# Patient Record
Sex: Female | Born: 1940
Health system: Southern US, Community
[De-identification: ages and names within clinical notes are randomized; demographics above are authoritative.]

## PROBLEM LIST (undated history)

## (undated) DIAGNOSIS — R112 Nausea with vomiting, unspecified: Secondary | ICD-10-CM

## (undated) DIAGNOSIS — F32A Depression, unspecified: Secondary | ICD-10-CM

## (undated) DIAGNOSIS — C50912 Malignant neoplasm of unspecified site of left female breast: Secondary | ICD-10-CM

## (undated) DIAGNOSIS — M545 Low back pain, unspecified: Secondary | ICD-10-CM

## (undated) DIAGNOSIS — C44329 Squamous cell carcinoma of skin of other parts of face: Secondary | ICD-10-CM

## (undated) DIAGNOSIS — I10 Essential (primary) hypertension: Secondary | ICD-10-CM

## (undated) DIAGNOSIS — E039 Hypothyroidism, unspecified: Secondary | ICD-10-CM

## (undated) DIAGNOSIS — C50911 Malignant neoplasm of unspecified site of right female breast: Secondary | ICD-10-CM

## (undated) DIAGNOSIS — M810 Age-related osteoporosis without current pathological fracture: Secondary | ICD-10-CM

## (undated) DIAGNOSIS — E785 Hyperlipidemia, unspecified: Secondary | ICD-10-CM

## (undated) DIAGNOSIS — Z9889 Other specified postprocedural states: Secondary | ICD-10-CM

## (undated) DIAGNOSIS — D509 Iron deficiency anemia, unspecified: Secondary | ICD-10-CM

## (undated) DIAGNOSIS — K219 Gastro-esophageal reflux disease without esophagitis: Secondary | ICD-10-CM

## (undated) DIAGNOSIS — F419 Anxiety disorder, unspecified: Secondary | ICD-10-CM

## (undated) DIAGNOSIS — F329 Major depressive disorder, single episode, unspecified: Secondary | ICD-10-CM

## (undated) DIAGNOSIS — I4891 Unspecified atrial fibrillation: Secondary | ICD-10-CM

## (undated) DIAGNOSIS — K5792 Diverticulitis of intestine, part unspecified, without perforation or abscess without bleeding: Secondary | ICD-10-CM

## (undated) DIAGNOSIS — R110 Nausea: Secondary | ICD-10-CM

## (undated) DIAGNOSIS — R519 Headache, unspecified: Secondary | ICD-10-CM

## (undated) DIAGNOSIS — R51 Headache: Secondary | ICD-10-CM

## (undated) HISTORY — PX: DILATION AND CURETTAGE OF UTERUS: SHX78

## (undated) HISTORY — PX: INGUINAL HERNIA REPAIR: SUR1180

## (undated) HISTORY — DX: Nausea: R11.0

## (undated) HISTORY — DX: Essential (primary) hypertension: I10

## (undated) HISTORY — PX: WRIST FRACTURE SURGERY: SHX121

## (undated) HISTORY — DX: Low back pain: M54.5

## (undated) HISTORY — DX: Low back pain, unspecified: M54.50

## (undated) HISTORY — DX: Major depressive disorder, single episode, unspecified: F32.9

## (undated) HISTORY — DX: Depression, unspecified: F32.A

## (undated) HISTORY — DX: Age-related osteoporosis without current pathological fracture: M81.0

## (undated) HISTORY — PX: ABDOMINAL HYSTERECTOMY: SHX81

## (undated) HISTORY — DX: Diverticulitis of intestine, part unspecified, without perforation or abscess without bleeding: K57.92

## (undated) HISTORY — PX: EXCISIONAL HEMORRHOIDECTOMY: SHX1541

## (undated) HISTORY — PX: CATARACT EXTRACTION W/ INTRAOCULAR LENS  IMPLANT, BILATERAL: SHX1307

---

## 1970-05-22 HISTORY — PX: TOTAL THYROIDECTOMY: SHX2547

## 2000-01-03 ENCOUNTER — Encounter: Payer: Self-pay | Admitting: Obstetrics and Gynecology

## 2000-01-03 ENCOUNTER — Encounter: Admission: RE | Admit: 2000-01-03 | Discharge: 2000-01-03 | Payer: Self-pay | Admitting: Obstetrics and Gynecology

## 2000-01-09 ENCOUNTER — Encounter: Admission: RE | Admit: 2000-01-09 | Discharge: 2000-01-09 | Payer: Self-pay | Admitting: Internal Medicine

## 2000-01-09 ENCOUNTER — Encounter: Payer: Self-pay | Admitting: Obstetrics and Gynecology

## 2000-02-27 ENCOUNTER — Encounter: Payer: Self-pay | Admitting: Obstetrics and Gynecology

## 2000-02-27 ENCOUNTER — Encounter: Admission: RE | Admit: 2000-02-27 | Discharge: 2000-02-27 | Payer: Self-pay | Admitting: Obstetrics and Gynecology

## 2000-04-08 ENCOUNTER — Encounter: Payer: Self-pay | Admitting: Oral Surgery

## 2000-04-08 ENCOUNTER — Ambulatory Visit (HOSPITAL_COMMUNITY): Admission: RE | Admit: 2000-04-08 | Discharge: 2000-04-08 | Payer: Self-pay | Admitting: Oral Surgery

## 2000-05-22 HISTORY — PX: BREAST LUMPECTOMY: SHX2

## 2000-09-24 ENCOUNTER — Encounter: Payer: Self-pay | Admitting: Obstetrics and Gynecology

## 2000-09-24 ENCOUNTER — Other Ambulatory Visit: Admission: RE | Admit: 2000-09-24 | Discharge: 2000-09-24 | Payer: Self-pay | Admitting: Radiology

## 2000-09-24 ENCOUNTER — Encounter: Admission: RE | Admit: 2000-09-24 | Discharge: 2000-09-24 | Payer: Self-pay | Admitting: Obstetrics and Gynecology

## 2000-09-24 ENCOUNTER — Encounter (INDEPENDENT_AMBULATORY_CARE_PROVIDER_SITE_OTHER): Payer: Self-pay | Admitting: *Deleted

## 2000-10-30 ENCOUNTER — Encounter (INDEPENDENT_AMBULATORY_CARE_PROVIDER_SITE_OTHER): Payer: Self-pay | Admitting: *Deleted

## 2000-10-30 ENCOUNTER — Ambulatory Visit (HOSPITAL_BASED_OUTPATIENT_CLINIC_OR_DEPARTMENT_OTHER): Admission: RE | Admit: 2000-10-30 | Discharge: 2000-10-30 | Payer: Self-pay | Admitting: *Deleted

## 2000-11-13 ENCOUNTER — Encounter: Payer: Self-pay | Admitting: *Deleted

## 2000-11-16 ENCOUNTER — Encounter (INDEPENDENT_AMBULATORY_CARE_PROVIDER_SITE_OTHER): Payer: Self-pay | Admitting: Specialist

## 2000-11-16 ENCOUNTER — Encounter: Payer: Self-pay | Admitting: *Deleted

## 2000-11-16 ENCOUNTER — Ambulatory Visit (HOSPITAL_COMMUNITY): Admission: RE | Admit: 2000-11-16 | Discharge: 2000-11-16 | Payer: Self-pay | Admitting: *Deleted

## 2000-11-26 ENCOUNTER — Ambulatory Visit: Admission: RE | Admit: 2000-11-26 | Discharge: 2001-02-24 | Payer: Self-pay | Admitting: Radiation Oncology

## 2001-10-24 ENCOUNTER — Encounter: Admission: RE | Admit: 2001-10-24 | Discharge: 2001-10-24 | Payer: Self-pay | Admitting: Oncology

## 2001-10-24 ENCOUNTER — Encounter (HOSPITAL_COMMUNITY): Payer: Self-pay | Admitting: Oncology

## 2001-12-03 ENCOUNTER — Encounter (HOSPITAL_COMMUNITY): Payer: Self-pay | Admitting: Oncology

## 2001-12-03 ENCOUNTER — Ambulatory Visit (HOSPITAL_COMMUNITY): Admission: RE | Admit: 2001-12-03 | Discharge: 2001-12-03 | Payer: Self-pay | Admitting: Oncology

## 2002-11-14 ENCOUNTER — Encounter: Admission: RE | Admit: 2002-11-14 | Discharge: 2002-11-14 | Payer: Self-pay | Admitting: Oncology

## 2002-11-14 ENCOUNTER — Encounter (HOSPITAL_COMMUNITY): Payer: Self-pay | Admitting: Oncology

## 2003-01-15 ENCOUNTER — Encounter (HOSPITAL_COMMUNITY): Payer: Self-pay | Admitting: Oncology

## 2003-01-15 ENCOUNTER — Ambulatory Visit (HOSPITAL_COMMUNITY): Admission: RE | Admit: 2003-01-15 | Discharge: 2003-01-15 | Payer: Self-pay | Admitting: Oncology

## 2003-05-23 HISTORY — PX: MASTECTOMY: SHX3

## 2004-01-08 ENCOUNTER — Encounter (INDEPENDENT_AMBULATORY_CARE_PROVIDER_SITE_OTHER): Payer: Self-pay | Admitting: Specialist

## 2004-01-08 ENCOUNTER — Encounter: Admission: RE | Admit: 2004-01-08 | Discharge: 2004-01-08 | Payer: Self-pay | Admitting: Oncology

## 2004-01-12 ENCOUNTER — Ambulatory Visit (HOSPITAL_COMMUNITY): Admission: RE | Admit: 2004-01-12 | Discharge: 2004-01-12 | Payer: Self-pay | Admitting: Oncology

## 2004-01-18 ENCOUNTER — Encounter (HOSPITAL_COMMUNITY): Admission: RE | Admit: 2004-01-18 | Discharge: 2004-01-26 | Payer: Self-pay | Admitting: Oncology

## 2004-02-11 ENCOUNTER — Ambulatory Visit (HOSPITAL_COMMUNITY): Admission: RE | Admit: 2004-02-11 | Discharge: 2004-02-13 | Payer: Self-pay | Admitting: General Surgery

## 2004-02-11 ENCOUNTER — Encounter (INDEPENDENT_AMBULATORY_CARE_PROVIDER_SITE_OTHER): Payer: Self-pay | Admitting: *Deleted

## 2004-07-04 ENCOUNTER — Ambulatory Visit: Payer: Self-pay | Admitting: Oncology

## 2004-11-04 ENCOUNTER — Ambulatory Visit: Payer: Self-pay | Admitting: Oncology

## 2005-02-22 ENCOUNTER — Ambulatory Visit: Payer: Self-pay | Admitting: Oncology

## 2005-02-23 ENCOUNTER — Ambulatory Visit (HOSPITAL_COMMUNITY): Admission: RE | Admit: 2005-02-23 | Discharge: 2005-02-23 | Payer: Self-pay | Admitting: Oncology

## 2005-03-06 ENCOUNTER — Encounter: Admission: RE | Admit: 2005-03-06 | Discharge: 2005-03-06 | Payer: Self-pay | Admitting: Oncology

## 2005-08-23 ENCOUNTER — Ambulatory Visit: Payer: Self-pay | Admitting: Oncology

## 2005-08-24 LAB — COMPREHENSIVE METABOLIC PANEL
ALT: 13 U/L (ref 0–40)
AST: 19 U/L (ref 0–37)
Albumin: 4.3 g/dL (ref 3.5–5.2)
Alkaline Phosphatase: 84 U/L (ref 39–117)
BUN: 15 mg/dL (ref 6–23)
CO2: 27 mEq/L (ref 19–32)
Calcium: 9.2 mg/dL (ref 8.4–10.5)
Chloride: 103 mEq/L (ref 96–112)
Creatinine, Ser: 0.9 mg/dL (ref 0.4–1.2)
Glucose, Bld: 113 mg/dL — ABNORMAL HIGH (ref 70–99)
Potassium: 4.1 mEq/L (ref 3.5–5.3)
Sodium: 140 mEq/L (ref 135–145)
Total Bilirubin: 0.6 mg/dL (ref 0.3–1.2)
Total Protein: 6.9 g/dL (ref 6.0–8.3)

## 2005-08-24 LAB — CBC WITH DIFFERENTIAL/PLATELET
BASO%: 0.7 % (ref 0.0–2.0)
Basophils Absolute: 0 10*3/uL (ref 0.0–0.1)
EOS%: 2.8 % (ref 0.0–7.0)
Eosinophils Absolute: 0.2 10*3/uL (ref 0.0–0.5)
HCT: 42.6 % (ref 34.8–46.6)
HGB: 14.4 g/dL (ref 11.6–15.9)
LYMPH%: 35.2 % (ref 14.0–48.0)
MCH: 29.9 pg (ref 26.0–34.0)
MCHC: 33.8 g/dL (ref 32.0–36.0)
MCV: 88.5 fL (ref 81.0–101.0)
MONO#: 0.4 10*3/uL (ref 0.1–0.9)
MONO%: 7.2 % (ref 0.0–13.0)
NEUT#: 3.2 10*3/uL (ref 1.5–6.5)
NEUT%: 54.1 % (ref 39.6–76.8)
Platelets: 192 10*3/uL (ref 145–400)
RBC: 4.81 10*6/uL (ref 3.70–5.32)
RDW: 13.1 % (ref 11.3–14.5)
WBC: 6 10*3/uL (ref 3.9–10.0)
lymph#: 2.1 10*3/uL (ref 0.9–3.3)

## 2005-08-24 LAB — LACTATE DEHYDROGENASE: LDH: 140 U/L (ref 94–250)

## 2006-03-01 ENCOUNTER — Ambulatory Visit: Payer: Self-pay | Admitting: Oncology

## 2006-03-05 ENCOUNTER — Ambulatory Visit (HOSPITAL_COMMUNITY): Admission: RE | Admit: 2006-03-05 | Discharge: 2006-03-05 | Payer: Self-pay | Admitting: Oncology

## 2006-03-12 ENCOUNTER — Encounter: Admission: RE | Admit: 2006-03-12 | Discharge: 2006-03-12 | Payer: Self-pay | Admitting: Oncology

## 2006-08-12 ENCOUNTER — Emergency Department (HOSPITAL_COMMUNITY): Admission: EM | Admit: 2006-08-12 | Discharge: 2006-08-13 | Payer: Self-pay | Admitting: Emergency Medicine

## 2006-08-29 ENCOUNTER — Ambulatory Visit: Payer: Self-pay | Admitting: Oncology

## 2006-09-03 LAB — CBC WITH DIFFERENTIAL/PLATELET
Eosinophils Absolute: 0.2 10*3/uL (ref 0.0–0.5)
MCV: 87.7 fL (ref 81.0–101.0)
MONO#: 0.6 10*3/uL (ref 0.1–0.9)
MONO%: 9.4 % (ref 0.0–13.0)
NEUT#: 3.7 10*3/uL (ref 1.5–6.5)
RBC: 4.5 10*6/uL (ref 3.70–5.32)
RDW: 13 % (ref 11.3–14.5)
WBC: 6.4 10*3/uL (ref 3.9–10.0)
lymph#: 1.9 10*3/uL (ref 0.9–3.3)

## 2006-09-03 LAB — COMPREHENSIVE METABOLIC PANEL
AST: 17 U/L (ref 0–37)
Albumin: 4.1 g/dL (ref 3.5–5.2)
Alkaline Phosphatase: 84 U/L (ref 39–117)
BUN: 16 mg/dL (ref 6–23)
Calcium: 9.2 mg/dL (ref 8.4–10.5)
Creatinine, Ser: 1.05 mg/dL (ref 0.40–1.20)
Glucose, Bld: 85 mg/dL (ref 70–99)
Potassium: 4.3 mEq/L (ref 3.5–5.3)

## 2007-02-28 ENCOUNTER — Ambulatory Visit: Payer: Self-pay | Admitting: Oncology

## 2007-03-04 ENCOUNTER — Ambulatory Visit (HOSPITAL_COMMUNITY): Admission: RE | Admit: 2007-03-04 | Discharge: 2007-03-04 | Payer: Self-pay | Admitting: Oncology

## 2007-03-04 LAB — CBC WITH DIFFERENTIAL/PLATELET
Basophils Absolute: 0.1 10*3/uL (ref 0.0–0.1)
Eosinophils Absolute: 0.2 10*3/uL (ref 0.0–0.5)
HGB: 13.5 g/dL (ref 11.6–15.9)
MONO#: 0.5 10*3/uL (ref 0.1–0.9)
NEUT#: 3.1 10*3/uL (ref 1.5–6.5)
Platelets: 188 10*3/uL (ref 145–400)
RBC: 4.47 10*6/uL (ref 3.70–5.32)
RDW: 12.9 % (ref 11.3–14.5)
WBC: 5.6 10*3/uL (ref 3.9–10.0)

## 2007-03-04 LAB — COMPREHENSIVE METABOLIC PANEL
Albumin: 4.3 g/dL (ref 3.5–5.2)
BUN: 16 mg/dL (ref 6–23)
CO2: 26 mEq/L (ref 19–32)
Calcium: 9.1 mg/dL (ref 8.4–10.5)
Chloride: 103 mEq/L (ref 96–112)
Glucose, Bld: 90 mg/dL (ref 70–99)
Potassium: 4.2 mEq/L (ref 3.5–5.3)
Sodium: 138 mEq/L (ref 135–145)
Total Protein: 6.9 g/dL (ref 6.0–8.3)

## 2007-03-04 LAB — LACTATE DEHYDROGENASE: LDH: 142 U/L (ref 94–250)

## 2007-03-18 ENCOUNTER — Encounter: Admission: RE | Admit: 2007-03-18 | Discharge: 2007-03-18 | Payer: Self-pay | Admitting: Oncology

## 2007-08-28 ENCOUNTER — Ambulatory Visit: Payer: Self-pay | Admitting: Oncology

## 2007-09-02 LAB — CBC WITH DIFFERENTIAL/PLATELET
BASO%: 0.1 % (ref 0.0–2.0)
HCT: 40.3 % (ref 34.8–46.6)
MCHC: 34.3 g/dL (ref 32.0–36.0)
MONO#: 0.4 10*3/uL (ref 0.1–0.9)
NEUT%: 48.7 % (ref 39.6–76.8)
WBC: 6 10*3/uL (ref 3.9–10.0)
lymph#: 2.4 10*3/uL (ref 0.9–3.3)

## 2007-09-02 LAB — LACTATE DEHYDROGENASE: LDH: 129 U/L (ref 94–250)

## 2007-09-02 LAB — COMPREHENSIVE METABOLIC PANEL
ALT: 11 U/L (ref 0–35)
Albumin: 4.2 g/dL (ref 3.5–5.2)
CO2: 25 mEq/L (ref 19–32)
Calcium: 9.2 mg/dL (ref 8.4–10.5)
Chloride: 104 mEq/L (ref 96–112)
Creatinine, Ser: 0.92 mg/dL (ref 0.40–1.20)
Potassium: 3.7 mEq/L (ref 3.5–5.3)
Sodium: 141 mEq/L (ref 135–145)
Total Protein: 6.9 g/dL (ref 6.0–8.3)

## 2008-02-27 ENCOUNTER — Ambulatory Visit: Payer: Self-pay | Admitting: Oncology

## 2008-03-02 ENCOUNTER — Ambulatory Visit (HOSPITAL_COMMUNITY): Admission: RE | Admit: 2008-03-02 | Discharge: 2008-03-02 | Payer: Self-pay | Admitting: Oncology

## 2008-03-02 LAB — CBC WITH DIFFERENTIAL/PLATELET
Eosinophils Absolute: 0.2 10*3/uL (ref 0.0–0.5)
HCT: 40.4 % (ref 34.8–46.6)
LYMPH%: 29.5 % (ref 14.0–48.0)
MONO#: 0.6 10*3/uL (ref 0.1–0.9)
NEUT#: 3.4 10*3/uL (ref 1.5–6.5)
NEUT%: 56.7 % (ref 39.6–76.8)
Platelets: 169 10*3/uL (ref 145–400)
WBC: 6.1 10*3/uL (ref 3.9–10.0)

## 2008-03-02 LAB — LACTATE DEHYDROGENASE: LDH: 137 U/L (ref 94–250)

## 2008-03-02 LAB — COMPREHENSIVE METABOLIC PANEL
CO2: 25 mEq/L (ref 19–32)
Creatinine, Ser: 0.88 mg/dL (ref 0.40–1.20)
Glucose, Bld: 92 mg/dL (ref 70–99)
Sodium: 140 mEq/L (ref 135–145)
Total Bilirubin: 0.8 mg/dL (ref 0.3–1.2)
Total Protein: 6.7 g/dL (ref 6.0–8.3)

## 2008-03-19 ENCOUNTER — Encounter: Admission: RE | Admit: 2008-03-19 | Discharge: 2008-03-19 | Payer: Self-pay | Admitting: Oncology

## 2008-08-27 ENCOUNTER — Ambulatory Visit: Payer: Self-pay | Admitting: Oncology

## 2008-08-31 LAB — COMPREHENSIVE METABOLIC PANEL
AST: 14 U/L (ref 0–37)
BUN: 13 mg/dL (ref 6–23)
CO2: 25 mEq/L (ref 19–32)
Calcium: 9.1 mg/dL (ref 8.4–10.5)
Chloride: 103 mEq/L (ref 96–112)
Creatinine, Ser: 0.96 mg/dL (ref 0.40–1.20)

## 2008-08-31 LAB — CBC WITH DIFFERENTIAL/PLATELET
Basophils Absolute: 0.1 10*3/uL (ref 0.0–0.1)
EOS%: 3.4 % (ref 0.0–7.0)
HCT: 40.3 % (ref 34.8–46.6)
HGB: 13.7 g/dL (ref 11.6–15.9)
MCH: 30.2 pg (ref 25.1–34.0)
NEUT%: 50.5 % (ref 38.4–76.8)
Platelets: 199 10*3/uL (ref 145–400)
lymph#: 1.9 10*3/uL (ref 0.9–3.3)

## 2009-03-01 ENCOUNTER — Ambulatory Visit: Payer: Self-pay | Admitting: Oncology

## 2009-03-01 LAB — CBC WITH DIFFERENTIAL/PLATELET
BASO%: 1.1 % (ref 0.0–2.0)
EOS%: 3.2 % (ref 0.0–7.0)
HCT: 38.2 % (ref 34.8–46.6)
LYMPH%: 33.4 % (ref 14.0–49.7)
MCH: 29.3 pg (ref 25.1–34.0)
MCHC: 33.2 g/dL (ref 31.5–36.0)
MONO%: 9.8 % (ref 0.0–14.0)
NEUT%: 52.5 % (ref 38.4–76.8)
Platelets: 138 10*3/uL — ABNORMAL LOW (ref 145–400)
RBC: 4.33 10*6/uL (ref 3.70–5.45)

## 2009-03-01 LAB — COMPREHENSIVE METABOLIC PANEL
AST: 16 U/L (ref 0–37)
Alkaline Phosphatase: 67 U/L (ref 39–117)
BUN: 15 mg/dL (ref 6–23)
Calcium: 9.2 mg/dL (ref 8.4–10.5)
Chloride: 100 mEq/L (ref 96–112)
Creatinine, Ser: 0.94 mg/dL (ref 0.40–1.20)
Total Bilirubin: 0.6 mg/dL (ref 0.3–1.2)

## 2009-03-22 ENCOUNTER — Encounter: Admission: RE | Admit: 2009-03-22 | Discharge: 2009-03-22 | Payer: Self-pay | Admitting: Oncology

## 2009-03-30 LAB — CBC WITH DIFFERENTIAL/PLATELET
Basophils Absolute: 0.1 10*3/uL (ref 0.0–0.1)
EOS%: 2.3 % (ref 0.0–7.0)
Eosinophils Absolute: 0.2 10*3/uL (ref 0.0–0.5)
HGB: 13.9 g/dL (ref 11.6–15.9)
LYMPH%: 33.9 % (ref 14.0–49.7)
MCH: 29.8 pg (ref 25.1–34.0)
MCV: 90.1 fL (ref 79.5–101.0)
MONO%: 7.6 % (ref 0.0–14.0)
NEUT#: 4.2 10*3/uL (ref 1.5–6.5)
Platelets: 197 10*3/uL (ref 145–400)
RBC: 4.67 10*6/uL (ref 3.70–5.45)

## 2009-05-22 HISTORY — PX: HEMIARTHROPLASTY HIP: SUR652

## 2009-05-22 HISTORY — PX: ELBOW FRACTURE SURGERY: SHX616

## 2010-01-23 ENCOUNTER — Encounter: Payer: Self-pay | Admitting: Emergency Medicine

## 2010-01-23 ENCOUNTER — Inpatient Hospital Stay (HOSPITAL_COMMUNITY): Admission: AD | Admit: 2010-01-23 | Discharge: 2010-02-01 | Payer: Self-pay | Admitting: Orthopedic Surgery

## 2010-01-23 ENCOUNTER — Ambulatory Visit: Payer: Self-pay | Admitting: Diagnostic Radiology

## 2010-01-28 ENCOUNTER — Ambulatory Visit: Payer: Self-pay | Admitting: Physical Medicine & Rehabilitation

## 2010-02-25 ENCOUNTER — Ambulatory Visit: Payer: Self-pay | Admitting: Oncology

## 2010-03-17 LAB — CBC WITH DIFFERENTIAL/PLATELET
BASO%: 1 % (ref 0.0–2.0)
Basophils Absolute: 0.1 10*3/uL (ref 0.0–0.1)
EOS%: 4.4 % (ref 0.0–7.0)
Eosinophils Absolute: 0.3 10*3/uL (ref 0.0–0.5)
HCT: 40.8 % (ref 34.8–46.6)
HGB: 13.5 g/dL (ref 11.6–15.9)
LYMPH%: 29.5 % (ref 14.0–49.7)
MCH: 28.6 pg (ref 25.1–34.0)
MCHC: 33 g/dL (ref 31.5–36.0)
MCV: 86.9 fL (ref 79.5–101.0)
MONO#: 0.6 10*3/uL (ref 0.1–0.9)
MONO%: 8.3 % (ref 0.0–14.0)
NEUT#: 4.2 10*3/uL (ref 1.5–6.5)
NEUT%: 56.8 % (ref 38.4–76.8)
Platelets: 238 10*3/uL (ref 145–400)
RBC: 4.7 10*6/uL (ref 3.70–5.45)
RDW: 14.5 % (ref 11.2–14.5)
WBC: 7.4 10*3/uL (ref 3.9–10.3)
lymph#: 2.2 10*3/uL (ref 0.9–3.3)

## 2010-03-17 LAB — COMPREHENSIVE METABOLIC PANEL
ALT: 11 U/L (ref 0–35)
AST: 17 U/L (ref 0–37)
Albumin: 4.2 g/dL (ref 3.5–5.2)
Alkaline Phosphatase: 81 U/L (ref 39–117)
BUN: 16 mg/dL (ref 6–23)
CO2: 25 mEq/L (ref 19–32)
Calcium: 9.7 mg/dL (ref 8.4–10.5)
Chloride: 99 mEq/L (ref 96–112)
Creatinine, Ser: 1.08 mg/dL (ref 0.40–1.20)
Glucose, Bld: 116 mg/dL — ABNORMAL HIGH (ref 70–99)
Potassium: 3.8 mEq/L (ref 3.5–5.3)
Sodium: 140 mEq/L (ref 135–145)
Total Bilirubin: 0.5 mg/dL (ref 0.3–1.2)
Total Protein: 6.9 g/dL (ref 6.0–8.3)

## 2010-03-17 LAB — LACTATE DEHYDROGENASE: LDH: 141 U/L (ref 94–250)

## 2010-06-12 ENCOUNTER — Encounter: Payer: Self-pay | Admitting: General Surgery

## 2010-07-04 ENCOUNTER — Other Ambulatory Visit: Payer: Self-pay | Admitting: Dermatology

## 2010-07-14 ENCOUNTER — Other Ambulatory Visit: Payer: Self-pay | Admitting: Internal Medicine

## 2010-07-14 DIAGNOSIS — Z9010 Acquired absence of unspecified breast and nipple: Secondary | ICD-10-CM

## 2010-07-14 DIAGNOSIS — Z1231 Encounter for screening mammogram for malignant neoplasm of breast: Secondary | ICD-10-CM

## 2010-08-04 LAB — CBC
HCT: 26.4 % — ABNORMAL LOW (ref 36.0–46.0)
HCT: 31.2 % — ABNORMAL LOW (ref 36.0–46.0)
HCT: 37.5 % (ref 36.0–46.0)
Hemoglobin: 10.5 g/dL — ABNORMAL LOW (ref 12.0–15.0)
Hemoglobin: 12.6 g/dL (ref 12.0–15.0)
Hemoglobin: 9 g/dL — ABNORMAL LOW (ref 12.0–15.0)
Hemoglobin: 9.1 g/dL — ABNORMAL LOW (ref 12.0–15.0)
Hemoglobin: 9.3 g/dL — ABNORMAL LOW (ref 12.0–15.0)
Hemoglobin: 9.4 g/dL — ABNORMAL LOW (ref 12.0–15.0)
Hemoglobin: 9.6 g/dL — ABNORMAL LOW (ref 12.0–15.0)
MCH: 29.9 pg (ref 26.0–34.0)
MCH: 30 pg (ref 26.0–34.0)
MCH: 30.1 pg (ref 26.0–34.0)
MCH: 30.2 pg (ref 26.0–34.0)
MCHC: 33.5 g/dL (ref 30.0–36.0)
MCHC: 33.5 g/dL (ref 30.0–36.0)
MCV: 89 fL (ref 78.0–100.0)
MCV: 89.4 fL (ref 78.0–100.0)
MCV: 89.5 fL (ref 78.0–100.0)
MCV: 90.3 fL (ref 78.0–100.0)
Platelets: 103 10*3/uL — ABNORMAL LOW (ref 150–400)
Platelets: 143 10*3/uL — ABNORMAL LOW (ref 150–400)
Platelets: 242 10*3/uL (ref 150–400)
Platelets: 99 10*3/uL — ABNORMAL LOW (ref 150–400)
Platelets: 185 10*3/uL (ref 150–400)
RBC: 3.04 MIL/uL — ABNORMAL LOW (ref 3.87–5.11)
RBC: 3.1 MIL/uL — ABNORMAL LOW (ref 3.87–5.11)
RBC: 3.13 MIL/uL — ABNORMAL LOW (ref 3.87–5.11)
RBC: 3.19 MIL/uL — ABNORMAL LOW (ref 3.87–5.11)
RBC: 3.5 MIL/uL — ABNORMAL LOW (ref 3.87–5.11)
RBC: 4.46 MIL/uL (ref 3.87–5.11)
RDW: 13.6 % (ref 11.5–15.5)
WBC: 10.9 10*3/uL — ABNORMAL HIGH (ref 4.0–10.5)
WBC: 12.4 10*3/uL — ABNORMAL HIGH (ref 4.0–10.5)
WBC: 13 10*3/uL — ABNORMAL HIGH (ref 4.0–10.5)
WBC: 7.9 10*3/uL (ref 4.0–10.5)
WBC: 8.6 10*3/uL (ref 4.0–10.5)
WBC: 8.7 10*3/uL (ref 4.0–10.5)
WBC: 9.7 10*3/uL (ref 4.0–10.5)

## 2010-08-04 LAB — POTASSIUM: Potassium: 3.5 mEq/L (ref 3.5–5.1)

## 2010-08-04 LAB — CROSSMATCH: Antibody Screen: NEGATIVE

## 2010-08-04 LAB — URINALYSIS, ROUTINE W REFLEX MICROSCOPIC
Glucose, UA: NEGATIVE mg/dL
Glucose, UA: NEGATIVE mg/dL
Nitrite: NEGATIVE
Protein, ur: 30 mg/dL — AB
Specific Gravity, Urine: 1.027 (ref 1.005–1.030)
Urobilinogen, UA: 0.2 mg/dL (ref 0.0–1.0)
pH: 5.5 (ref 5.0–8.0)

## 2010-08-04 LAB — DIFFERENTIAL
Basophils Absolute: 0.1 10*3/uL (ref 0.0–0.1)
Basophils Absolute: 0.1 10*3/uL (ref 0.0–0.1)
Basophils Relative: 1 % (ref 0–1)
Basophils Relative: 1 % (ref 0–1)
Eosinophils Absolute: 0.4 10*3/uL (ref 0.0–0.7)
Eosinophils Absolute: 0.1 10*3/uL (ref 0.0–0.7)
Eosinophils Relative: 2 % (ref 0–5)
Lymphocytes Relative: 24 % (ref 12–46)
Lymphs Abs: 1.8 10*3/uL (ref 0.7–4.0)
Monocytes Absolute: 0.9 10*3/uL (ref 0.1–1.0)
Monocytes Relative: 12 % (ref 3–12)
Monocytes Relative: 13 % — ABNORMAL HIGH (ref 3–12)
Neutro Abs: 4.5 10*3/uL (ref 1.7–7.7)
Neutro Abs: 4.7 10*3/uL (ref 1.7–7.7)
Neutrophils Relative %: 57 % (ref 43–77)
Neutrophils Relative %: 59 % (ref 43–77)
Neutrophils Relative %: 57 % (ref 43–77)

## 2010-08-04 LAB — BASIC METABOLIC PANEL
BUN: 12 mg/dL (ref 6–23)
BUN: 18 mg/dL (ref 6–23)
CO2: 27 mEq/L (ref 19–32)
CO2: 27 mEq/L (ref 19–32)
CO2: 27 mEq/L (ref 19–32)
Calcium: 7.8 mg/dL — ABNORMAL LOW (ref 8.4–10.5)
Calcium: 7.9 mg/dL — ABNORMAL LOW (ref 8.4–10.5)
Calcium: 8 mg/dL — ABNORMAL LOW (ref 8.4–10.5)
Calcium: 8.3 mg/dL — ABNORMAL LOW (ref 8.4–10.5)
Calcium: 8.6 mg/dL (ref 8.4–10.5)
Chloride: 103 mEq/L (ref 96–112)
Chloride: 106 mEq/L (ref 96–112)
Creatinine, Ser: 0.77 mg/dL (ref 0.4–1.2)
Creatinine, Ser: 1.1 mg/dL (ref 0.4–1.2)
GFR calc Af Amer: >60 mL/min (ref >60)
GFR calc Af Amer: >60 mL/min (ref >60)
GFR calc Af Amer: >60 mL/min (ref >60)
GFR calc Af Amer: >60 mL/min (ref >60)
GFR calc Af Amer: >60 mL/min (ref >60)
GFR calc non Af Amer: >60 mL/min (ref >60)
GFR calc non Af Amer: >60 mL/min (ref >60)
GFR calc non Af Amer: >60 mL/min (ref >60)
GFR calc non Af Amer: >60 mL/min (ref >60)
GFR calc non Af Amer: >60 mL/min (ref >60)
Glucose, Bld: 122 mg/dL — ABNORMAL HIGH (ref 70–99)
Glucose, Bld: 93 mg/dL (ref 70–99)
Glucose, Bld: 89 mg/dL (ref 70–99)
Potassium: 3.2 mEq/L — ABNORMAL LOW (ref 3.5–5.1)
Potassium: 3.4 mEq/L — ABNORMAL LOW (ref 3.5–5.1)
Potassium: 4 mEq/L (ref 3.5–5.1)
Sodium: 136 mEq/L (ref 135–145)
Sodium: 136 mEq/L (ref 135–145)
Sodium: 138 mEq/L (ref 135–145)
Sodium: 138 mEq/L (ref 135–145)
Sodium: 139 mEq/L (ref 135–145)
Sodium: 139 mEq/L (ref 135–145)

## 2010-08-04 LAB — URINE CULTURE
Culture  Setup Time: 201109071855
Special Requests: NEGATIVE

## 2010-08-04 LAB — PROTIME-INR
INR: 1.07 (ref 0.00–1.49)
INR: 1.11 (ref 0.00–1.49)
INR: 1.13 (ref 0.00–1.49)
INR: 1.15 (ref 0.00–1.49)
INR: 1.18 (ref 0.00–1.49)
INR: 1.04 (ref 0.00–1.49)
Prothrombin Time: 14.5 seconds (ref 11.6–15.2)
Prothrombin Time: 14.7 seconds (ref 11.6–15.2)
Prothrombin Time: 14.9 seconds (ref 11.6–15.2)
Prothrombin Time: 13.8 seconds (ref 11.6–15.2)

## 2010-08-04 LAB — MRSA PCR SCREENING: MRSA by PCR: NEGATIVE

## 2010-08-04 LAB — CULTURE, BLOOD (ROUTINE X 2): Culture: NO GROWTH

## 2010-08-04 LAB — COMPREHENSIVE METABOLIC PANEL
AST: 38 U/L — ABNORMAL HIGH (ref 0–37)
Albumin: 2.1 g/dL — ABNORMAL LOW (ref 3.5–5.2)
Chloride: 107 mEq/L (ref 96–112)
Creatinine, Ser: 0.73 mg/dL (ref 0.4–1.2)
GFR calc Af Amer: >60 mL/min (ref >60)
Total Bilirubin: 0.9 mg/dL (ref 0.3–1.2)

## 2010-08-04 LAB — URINE MICROSCOPIC-ADD ON

## 2010-08-04 LAB — MAGNESIUM
Magnesium: 1.6 mg/dL (ref 1.5–2.5)
Magnesium: 1.7 mg/dL (ref 1.5–2.5)
Magnesium: 1.8 mg/dL (ref 1.5–2.5)

## 2010-08-04 LAB — CALCIUM: Calcium: 7.9 mg/dL — ABNORMAL LOW (ref 8.4–10.5)

## 2010-08-04 LAB — ABO/RH: ABO/RH(D): A POS

## 2010-08-09 ENCOUNTER — Ambulatory Visit
Admission: RE | Admit: 2010-08-09 | Discharge: 2010-08-09 | Disposition: A | Discharge status: Home / Self Care | Payer: Medicare Other | Source: Ambulatory Visit | Attending: *Deleted | Admitting: *Deleted

## 2010-08-09 DIAGNOSIS — Z1231 Encounter for screening mammogram for malignant neoplasm of breast: Secondary | ICD-10-CM

## 2010-08-09 DIAGNOSIS — Z9010 Acquired absence of unspecified breast and nipple: Secondary | ICD-10-CM

## 2010-10-07 NOTE — Op Note (Signed)
NAMERAEVEN, PINT                   ACCOUNT NO.:  [id number]   MEDICAL RECORD NO.:  [id number]          PATIENT TYPE:  OIB   LOCATION:  5727                         FACILITY:  [facility]   PHYSICIAN:  Angelia Mould. Derrell Lolling, M.D.DATE OF BIRTH:  1941-05-05   DATE OF PROCEDURE:  02/11/2004  DATE OF DISCHARGE:                                 OPERATIVE REPORT   PREOPERATIVE DIAGNOSIS:  Ductal carcinoma in situ, left breast.   POSTOPERATIVE DIAGNOSIS:  Ductal carcinoma in situ, left breast.   OPERATION PERFORMED:  Left total mastectomy.   SURGEON:  Angelia Mould. Derrell Lolling, M.D.   FIRST ASSISTANT:  Adolph Pollack, M.D.   OPERATIVE INDICATIONS:  This is a 70 year old white female with a history of  cancer of the right breast diagnosed in June of 2002.  She was treated with  a right partial mastectomy, right axillary lymph node dissection and  radiation therapy.  She took tamoxifen for about 3 years and then  discontinued that when she could no longer afford it.  She has been followed  mammographically.  Recent mammograms showed pleomorphic calcifications  laterally in the left breast.  Stereotactic core biopsy revealed high-grade  ductal carcinoma in situ with necrosis and calcifications.  She had a  bilateral breast MR which shows a solitary area of concern within the  lateral portion of the left breast with no other areas of abnormality noted.  We discussed the options for therapy including breast conservation with  lumpectomy and radiation therapy, and mastectomy.  Although technically she  is a good candidate for lumpectomy, she was highly concerned about expenses  and opted for the left total mastectomy.  She is brought to the operating  room electively.   OPERATIVE TECHNIQUE:  Following the induction of general endotracheal  anesthesia, the patient's left breast and arm, and axilla were prepped and  draped in a sterile fashion.  A  transverse elliptical incision was marked  and then made  with a knife.  Using electrocautery, we raised skin flaps  superiorly to the clavicle, medially to the sternal border, inferiorly to  the anterior rectus sheath and laterally to the latissimus dorsi muscle.  Using electrocautery, we dissected the breast and pectoralis fascia off of  the pectoralis major muscle, leaving the pectoralis major muscle intact.  We  continued to dissect laterally and dissected the breast tissue off of the  lateral border of the pectoralis major and the lateral border of the  pectoralis minor.  We did enter the axilla slightly.  We dissected the tail  of Spence up into the axilla and amputated the breast and tail of Spence,  and I probably removed some of the level I lymph nodes with the specimen.  We were able to stay fairly anterior and stay away from the axillary vein,  thoracodorsal nerve, long thoracic nerve and the medial pectoral nerve.  The  specimen was removed.  Hemostasis was excellent and achieved with  electrocautery.  The wound was irrigated with 2000 mL of saline.  It was  inspected again and there was no bleeding.  Two 19-French Blake drains were  placed and brought out through separate stab incisions inferior and lateral  to the incision.  One drain was placed up into the space where the axilla is  and one drain was placed across the skin flaps.  These were connected to  suction bulbs and sutured to the skin with nylon sutures.  The skin was  closed with skin staples and covered with occlusive dressing.  The patient  tolerated the procedure well and was taken to recovery room in stable  condition.   ESTIMATED BLOOD LOSS:  Estimated blood loss was about 100 mL.   COMPLICATIONS:  None.   COUNTS:  Sponge, needle and instrument count were correct.       HMI/MEDQ  D:  02/11/2004  T:  02/12/2004  Job:  [id number]   cc:   Samul Dada, M.D.  [address] [facility]  [place]  [place] [address]  Fax: [phone]   Alfonse Alpers. Dagoberto Ligas, M.D.  [address]  [place]  [place] [address]  Fax: [phone]

## 2011-03-20 ENCOUNTER — Encounter (HOSPITAL_BASED_OUTPATIENT_CLINIC_OR_DEPARTMENT_OTHER): Payer: Medicare Other | Admitting: Oncology

## 2011-03-20 ENCOUNTER — Other Ambulatory Visit (HOSPITAL_COMMUNITY): Payer: Self-pay | Admitting: Oncology

## 2011-03-20 ENCOUNTER — Telehealth: Payer: Self-pay | Admitting: Oncology

## 2011-03-20 DIAGNOSIS — C50919 Malignant neoplasm of unspecified site of unspecified female breast: Secondary | ICD-10-CM

## 2011-03-20 LAB — COMPREHENSIVE METABOLIC PANEL
ALT: 12 U/L (ref 0–35)
AST: 18 U/L (ref 0–37)
Albumin: 4.3 g/dL (ref 3.5–5.2)
Calcium: 9.7 mg/dL (ref 8.4–10.5)
Chloride: 99 mEq/L (ref 96–112)
Creatinine, Ser: 1.12 mg/dL — ABNORMAL HIGH (ref 0.50–1.10)
Potassium: 3.9 mEq/L (ref 3.5–5.3)

## 2011-03-20 LAB — CBC WITH DIFFERENTIAL/PLATELET
BASO%: 1.1 % (ref 0.0–2.0)
LYMPH%: 35.6 % (ref 14.0–49.7)
MCHC: 33.4 g/dL (ref 31.5–36.0)
MONO#: 0.7 10*3/uL (ref 0.1–0.9)
Platelets: 218 10*3/uL (ref 145–400)
RBC: 5.02 10*6/uL (ref 3.70–5.45)
RDW: 13.2 % (ref 11.2–14.5)
WBC: 6.4 10*3/uL (ref 3.9–10.3)
lymph#: 2.3 10*3/uL (ref 0.9–3.3)

## 2011-03-20 NOTE — Telephone Encounter (Signed)
gv pt appt schedule for oct 2013

## 2012-02-29 ENCOUNTER — Other Ambulatory Visit: Payer: Self-pay | Admitting: Internal Medicine

## 2012-02-29 DIAGNOSIS — Z1231 Encounter for screening mammogram for malignant neoplasm of breast: Secondary | ICD-10-CM

## 2012-03-01 ENCOUNTER — Ambulatory Visit
Admission: RE | Admit: 2012-03-01 | Discharge: 2012-03-01 | Disposition: A | Discharge status: Home / Self Care | Payer: Medicare Other | Source: Ambulatory Visit | Attending: Internal Medicine | Admitting: Internal Medicine

## 2012-03-01 DIAGNOSIS — Z1231 Encounter for screening mammogram for malignant neoplasm of breast: Secondary | ICD-10-CM

## 2012-03-01 LAB — HM MAMMOGRAPHY: HM Mammogram: NORMAL

## 2012-03-19 ENCOUNTER — Ambulatory Visit (HOSPITAL_BASED_OUTPATIENT_CLINIC_OR_DEPARTMENT_OTHER): Payer: Medicare Other | Admitting: Oncology

## 2012-03-19 ENCOUNTER — Other Ambulatory Visit (HOSPITAL_BASED_OUTPATIENT_CLINIC_OR_DEPARTMENT_OTHER): Payer: Medicare Other | Admitting: Lab

## 2012-03-19 ENCOUNTER — Encounter: Payer: Self-pay | Admitting: Oncology

## 2012-03-19 ENCOUNTER — Telehealth: Payer: Self-pay | Admitting: Oncology

## 2012-03-19 ENCOUNTER — Other Ambulatory Visit: Payer: Medicare Other | Admitting: Lab

## 2012-03-19 ENCOUNTER — Ambulatory Visit (HOSPITAL_COMMUNITY)
Admission: RE | Admit: 2012-03-19 | Discharge: 2012-03-19 | Disposition: A | Discharge status: Home / Self Care | Payer: Medicare Other | Source: Ambulatory Visit | Attending: Oncology | Admitting: Oncology

## 2012-03-19 ENCOUNTER — Ambulatory Visit: Payer: Medicare Other | Admitting: Oncology

## 2012-03-19 VITALS — BP 141/79 | HR 62 | Temp 97.8°F | Resp 20 | Wt 166.7 lb

## 2012-03-19 DIAGNOSIS — M810 Age-related osteoporosis without current pathological fracture: Secondary | ICD-10-CM

## 2012-03-19 DIAGNOSIS — Z853 Personal history of malignant neoplasm of breast: Secondary | ICD-10-CM

## 2012-03-19 DIAGNOSIS — I10 Essential (primary) hypertension: Secondary | ICD-10-CM | POA: Insufficient documentation

## 2012-03-19 DIAGNOSIS — C50919 Malignant neoplasm of unspecified site of unspecified female breast: Secondary | ICD-10-CM | POA: Insufficient documentation

## 2012-03-19 LAB — CBC WITH DIFFERENTIAL/PLATELET
Basophils Absolute: 0.1 10*3/uL (ref 0.0–0.1)
EOS%: 2.7 % (ref 0.0–7.0)
HGB: 13.9 g/dL (ref 11.6–15.9)
MCH: 29 pg (ref 25.1–34.0)
MCV: 86.2 fL (ref 79.5–101.0)
MONO%: 9 % (ref 0.0–14.0)
RDW: 13.3 % (ref 11.2–14.5)

## 2012-03-19 LAB — COMPREHENSIVE METABOLIC PANEL (CC13)
AST: 18 U/L (ref 5–34)
Albumin: 3.7 g/dL (ref 3.5–5.0)
Alkaline Phosphatase: 82 U/L (ref 40–150)
BUN: 10 mg/dL (ref 7.0–26.0)
Creatinine: 0.9 mg/dL (ref 0.6–1.1)
Potassium: 4.1 mEq/L (ref 3.5–5.1)
Total Bilirubin: 0.51 mg/dL (ref 0.20–1.20)

## 2012-03-19 NOTE — Progress Notes (Signed)
This office note has been dictated.  #[id number]

## 2012-03-19 NOTE — Progress Notes (Signed)
Quick Note:  Please notify patient and call/fax these results to patient's doctors. ______ 

## 2012-03-19 NOTE — Telephone Encounter (Signed)
PRN per MD

## 2012-03-20 NOTE — Progress Notes (Signed)
CC:   Bufford Spikes, DO Billie Lade, Ph.D., M.D. Angelia Mould. Derrell Lolling, M.D.  PROBLEM LIST: 1. Invasive carcinoma of the right breast, T1a N0, stage I, with     diagnosis going back to May 2002.  The patient was treated with     lumpectomy and radiation.  She was on adjuvant tamoxifen     for 3 years.  Tamoxifen was stopped in February 2005 because of     financial difficulties. Tissue was insufficient for determination of            hormone receptors. 2. High-grade ductal carcinoma in situ involving the left breast in     August 2005.  The patient underwent a left total mastectomy on     February 11, 2004.  Financial considerations played a role in the     patient's decision to have a mastectomy. 3. Hypertension. 4. Status post thyroidectomy in 1972 for a goiter. 5. Status post total abdominal hysterectomy in the 1970s. 6. Osteoporosis. 7. History of depression. 8. History of fracture involving the left hip and left radial head on     01/23/2010.  MEDICATIONS: 1. Fosamax 70 mg weekly. 2. Xanax 0.5 mg 3 times daily as needed. 3. Calcium carbonate-vitamin D 600 mg daily. 4. Cholecalciferol 1000 units daily. 5. Tranxene 7.5 mg at bedtime. 6. Levothyroxine 125 mcg daily. 7. Lisinopril 20 mg daily. 8. Zantac 150 mg daily. 9. Maxzide 37.5-25 one tablet daily  SMOKING HISTORY:  The patient has never smoked cigarettes.  HISTORY:  Deborah Randall. Delhomme was seen today for follow-up of her invasive carcinoma involving the right breast and her history of DCIS involving the left breast.  The patient denies any problems.  She continues to have a flat affect, but denies being depressed.  She lives by herself in [place].  She is independent, drives a car.  She was working at the [employer] through spring and summer.  Apparently, she was laid off.  As stated, she is without complaints or any symptoms to suggest recurrent breast cancer.  PHYSICAL EXAMINATION:  General:  The patient  looks generally well. Weight is 166.7 pounds.  Vital Signs:  Blood pressure 141/79.  Other vital signs are normal.  HEENT:  There is no scleral icterus.  Mouth and pharynx are benign.  There is no peripheral adenopathy palpable.  Heart and lungs:  Normal.  Breasts:  Left breast was surgically absent with no evidence for chest wall recurrence.  Right breast was benign with excellent cosmetic result and no suspicious findings.  No axillary adenopathy.  Abdomen:  Benign with no organomegaly or masses palpable. Extremities:  No peripheral edema or clubbing.  No lymphedema of either arm.  The patient has seborrheic keratoses over the trunk.  Neurologic: Normal.  LABORATORY DATA:  Today, white count 5.9, ANC 3.2, hemoglobin 13.9, hematocrit 41.2, platelets 179,000.  Chemistries notable for a sodium of 132, possibly due to the Maxzide.  IMAGING STUDIES: 1. Bone density scan on 08/18/2010 showed a T-score of the AP lumbar     spine of -3.7.  T-score of the right femoral neck was -2.8.  The     patient is classified as having osteoporosis by WHO criteria. 2. Mammogram of the right breast on 03/01/2012 was negative. 3. Chest x-ray, 2 view, from 03/19/2012 was negative.  IMPRESSION AND PLAN:  The patient, who is now 71 years old, is doing well without evidence for recurrent breast cancer.  She is now out 27-  1/2 years from the time of her invasive cancer involving the right breast and 8 years from the time of her intraductal carcinoma involving the left breast.  The patient is not on any treatment at this time.  I recommended that [name] have a chest x-ray today as she has not had a chest x-ray in a couple of years.  Her chest x-ray today was negative. I also told [name] that I did not think it was necessary for her to return for follow-up, unless she wanted that.  She is happy with being followed through her primary care doctor.  She will continue to need yearly mammograms of her right  breast.  Return appointment was not made.  I will be happy to see the patient again should any questions or problems arise in the future.    ______________________________ Samul Dada, M.D. DSM/MEDQ  D:  03/19/2012  T:  03/20/2012  Job:  [id number]

## 2012-04-02 ENCOUNTER — Telehealth: Payer: Self-pay | Admitting: Medical Oncology

## 2012-04-02 NOTE — Telephone Encounter (Signed)
I called pt per Dr. Arline Asp with chest x-ray results 03/19/12

## 2012-08-15 ENCOUNTER — Other Ambulatory Visit: Payer: Self-pay | Admitting: Geriatric Medicine

## 2012-08-15 MED ORDER — LISINOPRIL 40 MG PO TABS: ORAL_TABLET | ORAL | Status: DC

## 2012-09-05 ENCOUNTER — Encounter: Payer: Self-pay | Admitting: Internal Medicine

## 2012-09-05 ENCOUNTER — Ambulatory Visit (INDEPENDENT_AMBULATORY_CARE_PROVIDER_SITE_OTHER): Payer: Medicare Other | Admitting: Internal Medicine

## 2012-09-05 VITALS — BP 124/82 | HR 67 | Temp 98.0°F | Resp 14 | Wt 173.0 lb

## 2012-09-05 DIAGNOSIS — Z853 Personal history of malignant neoplasm of breast: Secondary | ICD-10-CM

## 2012-09-05 DIAGNOSIS — I10 Essential (primary) hypertension: Secondary | ICD-10-CM | POA: Insufficient documentation

## 2012-09-05 DIAGNOSIS — E039 Hypothyroidism, unspecified: Secondary | ICD-10-CM

## 2012-09-05 DIAGNOSIS — Z13220 Encounter for screening for lipoid disorders: Secondary | ICD-10-CM

## 2012-09-05 DIAGNOSIS — F3289 Other specified depressive episodes: Secondary | ICD-10-CM

## 2012-09-05 DIAGNOSIS — F32A Depression, unspecified: Secondary | ICD-10-CM | POA: Insufficient documentation

## 2012-09-05 DIAGNOSIS — M810 Age-related osteoporosis without current pathological fracture: Secondary | ICD-10-CM | POA: Insufficient documentation

## 2012-09-05 DIAGNOSIS — F329 Major depressive disorder, single episode, unspecified: Secondary | ICD-10-CM

## 2012-09-05 MED ORDER — TRIAMTERENE-HCTZ 37.5-25 MG PO TABS: ORAL_TABLET | ORAL | Status: DC

## 2012-09-05 NOTE — Assessment & Plan Note (Signed)
No longer needs to see oncology.  She is happy about this.

## 2012-09-05 NOTE — Assessment & Plan Note (Addendum)
Recheck vitamin D levels.  Cont fosamax.  No recent falls.  Continue to monitor.  Also gets weightbearing exercise.

## 2012-09-05 NOTE — Assessment & Plan Note (Addendum)
Mood improved now that she is back working.  Still appears a bit nervous.  Uses xanax prn.  I had gotten her off clorazepate, but she was put back on xanax at some point.  Would like to get her on an SSRI instead.

## 2012-09-05 NOTE — Progress Notes (Signed)
Patient ID: Deborah Randall, female   DOB: 1940-12-01, 72 y.o.   MRN: [id number] Code Status:  Must address at next visit.    No Known Allergies  Chief Complaint  Patient presents with  . Medical Managment of Chronic Issues    HPI: Patient is a 73 y.o. Caucasian female seen in the office today for medical management of chronic diseases.  She is doing better.  She's back to work at [employer].  Has suntan.    Feet and legs have been swelling.  Has only been taking half a maxzide tablet.  Wonders if she needs a full one.  No shortness of breath.    In Oct, saw oncologist and no longer needs to go back there unless new problem develops.    Review of Systems:  Review of Systems  Constitutional: Positive for malaise/fatigue. Negative for fever.       Thought maybe cold earlier in week or sinusitis  HENT: Negative for congestion and sore throat.   Eyes: Negative for blurred vision.  Respiratory: Negative for cough and shortness of breath.   Cardiovascular: Positive for leg swelling. Negative for chest pain.  Gastrointestinal: Negative for abdominal pain and constipation.  Genitourinary: Negative for urgency and frequency.  Musculoskeletal: Negative for myalgias, back pain, joint pain and falls.  Neurological: Negative for tingling.  Psychiatric/Behavioral: Negative for depression. The patient is not nervous/anxious.        Mood seems improved from prior visits.       Past Medical History  Diagnosis Date  . Thyroid disease   . Depression   . Unspecified cataract   . Hypertension   . Lumbago   . Senile osteoporosis   . Headache   . Nausea alone    No past surgical history on file. Social History:   reports that she has never smoked. She does not have any smokeless tobacco history on file. Her alcohol and drug histories are not on file.  No family history on file.  Medications: Patient's Medications  New Prescriptions   No medications on file  Previous Medications    ALENDRONATE (FOSAMAX) 70 MG TABLET    Take 70 mg by mouth every 7 (seven) days. Take with a full glass of water on an empty stomach.   ALPRAZOLAM (XANAX) 0.5 MG TABLET    Take 0.5 mg by mouth 3 (three) times daily as needed.   CALCIUM CARBONATE-VITAMIN D (CALCIUM + D PO)    Take 600 mg by mouth daily.   CHOLECALCIFEROL (VITAMIN D) 1000 UNITS TABLET    Take 1,000 Units by mouth daily.   LEVOTHYROXINE (SYNTHROID, LEVOTHROID) 125 MCG TABLET    Take 125 mcg by mouth daily.   LISINOPRIL (PRINIVIL,ZESTRIL) 40 MG TABLET    Take 1/2 tablet by mouth once daily to control blood pressure.   RANITIDINE (ZANTAC) 150 MG TABLET    Take 150 mg by mouth daily.  Modified Medications   Modified Medication Previous Medication   TRIAMTERENE-HYDROCHLOROTHIAZIDE (MAXZIDE-25) 37.5-25 MG PER TABLET triamterene-hydrochlorothiazide (MAXZIDE-25) 37.5-25 MG per tablet      Take 1/2 tablet alternating with 1 tablet daily for blood pressure and edema    Take 1/2 tablet once daily for blood pressure  Discontinued Medications   CLORAZEPATE (TRANXENE) 7.5 MG TABLET    Take 7.5 mg by mouth at bedtime.   Physical Exam:  Filed Vitals:   09/05/12 0835  BP: 124/82  Pulse: 67  Temp: 98 F (36.7 C)  TempSrc: Oral  Resp: 14  Weight: 173 lb (78.472 kg)  Physical Exam  Constitutional: She is oriented to person, place, and time. She appears well-developed and well-nourished. No distress.  HENT:  Head: Normocephalic and atraumatic.  Eyes: Pupils are equal, round, and reactive to light.  Cardiovascular: Normal rate, regular rhythm, normal heart sounds and intact distal pulses.   Edema present, nonpitting, varicose veins prominent, distal pulses +2  Pulmonary/Chest: Effort normal and breath sounds normal.  Musculoskeletal: Normal range of motion. She exhibits no tenderness.  Neurological: She is alert and oriented to person, place, and time.  Skin: Skin is warm and dry.  Psychiatric: She has a normal mood and affect. Her  behavior is normal. Judgment and thought content normal.   Labs reviewed: 07/14/2010 CBC Wbc 9.7 Rbc 4.99 Hemoglobin 14.1  CMP Glucose 79 Bun 16 Creatinine 1.03 TSH 2.700 Vitamin D 25 Hydroxy 19.3  Vit D Hydroxy 28.5  Basic Metabolic Panel:  Recent Labs  16/10/96 1246  NA 132*  K 4.1  CL 98  CO2 30*  GLUCOSE 87  BUN 10.0  CREATININE 0.9  CALCIUM 9.4   Liver Function Tests:  Recent Labs  03/19/12 1246  AST 18  ALT 13  ALKPHOS 82  BILITOT 0.51  PROT 6.3*  ALBUMIN 3.7   CBC:  Recent Labs  03/19/12 1246  WBC 5.9  NEUTROABS 3.2  HGB 13.9  HCT 41.2  MCV 86.2  PLT 179   Past Procedures: 2001-Pap and Bone Density Yearly Mammogram 2011-Hip and Arm X-Rays 08/19/2010 Mammogram  Negative  08/19/2010 Bone Density  03/01/12 Mammogram negative  Assessment/Plan HX: breast cancer No longer needs to see oncology.  She is happy about this.    Hypothyroidism F/u thyroid today with TSH.    Senile osteoporosis Recheck vitamin D levels.  Cont fosamax.  No recent falls.  Continue to monitor.  Also gets weightbearing exercise.    Depressive disorder, not elsewhere classified Mood improved now that she is back working.  Still appears a bit nervous.  Uses xanax prn.  I had gotten her off clorazepate, but she was put back on xanax at some point.  Would like to get her on an SSRI instead.    Essential hypertension, benign BP wonderful.  Due to edema, is going to take maxzide 1/2 alternating with one tablet in order to control swelling.  Cont lisinopril.  Elevate feet at home--does prop up.  Is on her feet most of the day at work.     Labs/tests ordered: TSH, vitamin D, cbc, bmp today

## 2012-09-05 NOTE — Assessment & Plan Note (Signed)
F/u thyroid today with TSH.

## 2012-09-05 NOTE — Assessment & Plan Note (Signed)
BP wonderful.  Due to edema, is going to take maxzide 1/2 alternating with one tablet in order to control swelling.  Cont lisinopril.  Elevate feet at home--does prop up.  Is on her feet most of the day at work.

## 2012-09-06 LAB — CBC WITH DIFFERENTIAL/PLATELET
Basophils Absolute: 0.1 10*3/uL (ref 0.0–0.2)
Basos: 2 % (ref 0–3)
Eos: 3 % (ref 0–5)
Eosinophils Absolute: 0.2 10*3/uL (ref 0.0–0.4)
HCT: 41.7 % (ref 34.0–46.6)
Hemoglobin: 13.6 g/dL (ref 11.1–15.9)
Immature Grans (Abs): 0 10*3/uL (ref 0.0–0.1)
Immature Granulocytes: 0 % (ref 0–2)
Lymphocytes Absolute: 2 10*3/uL (ref 0.7–3.1)
Lymphs: 36 % (ref 14–46)
MCH: 28.3 pg (ref 26.6–33.0)
MCHC: 32.6 g/dL (ref 31.5–35.7)
MCV: 87 fL (ref 79–97)
Monocytes Absolute: 0.6 10*3/uL (ref 0.1–0.9)
Monocytes: 10 % (ref 4–12)
Neutrophils Absolute: 2.8 10*3/uL (ref 1.4–7.0)
Neutrophils Relative %: 49 % (ref 40–74)
RBC: 4.81 x10E6/uL (ref 3.77–5.28)
RDW: 12.9 % (ref 12.3–15.4)
WBC: 5.6 10*3/uL (ref 3.4–10.8)

## 2012-09-06 LAB — COMPREHENSIVE METABOLIC PANEL
ALT: 13 IU/L (ref 0–32)
AST: 19 IU/L (ref 0–40)
Albumin/Globulin Ratio: 1.7 (ref 1.1–2.5)
Albumin: 4.1 g/dL (ref 3.5–4.8)
Alkaline Phosphatase: 64 IU/L (ref 39–117)
BUN/Creatinine Ratio: 11 (ref 11–26)
BUN: 13 mg/dL (ref 8–27)
CO2: 26 mmol/L (ref 19–28)
Calcium: 9.4 mg/dL (ref 8.6–10.2)
Chloride: 100 mmol/L (ref 97–108)
Creatinine, Ser: 1.18 mg/dL — ABNORMAL HIGH (ref 0.57–1.00)
GFR calc Af Amer: 53 mL/min/{1.73_m2} — ABNORMAL LOW (ref >59)
GFR calc non Af Amer: 46 mL/min/{1.73_m2} — ABNORMAL LOW (ref >59)
Globulin, Total: 2.4 g/dL (ref 1.5–4.5)
Glucose: 93 mg/dL (ref 65–99)
Potassium: 4.8 mmol/L (ref 3.5–5.2)
Sodium: 140 mmol/L (ref 134–144)
Total Bilirubin: 0.4 mg/dL (ref 0.0–1.2)
Total Protein: 6.5 g/dL (ref 6.0–8.5)

## 2012-09-06 LAB — LIPID PANEL
Chol/HDL Ratio: 5.2 ratio units — ABNORMAL HIGH (ref 0.0–4.4)
Cholesterol, Total: 227 mg/dL — ABNORMAL HIGH (ref 100–199)
HDL: 44 mg/dL (ref >39)
LDL Calculated: 149 mg/dL — ABNORMAL HIGH (ref 0–99)
Triglycerides: 171 mg/dL — ABNORMAL HIGH (ref 0–149)
VLDL Cholesterol Cal: 34 mg/dL (ref 5–40)

## 2012-09-06 LAB — TSH: TSH: 3.2 u[IU]/mL (ref 0.450–4.500)

## 2012-09-06 LAB — VITAMIN D 25 HYDROXY (VIT D DEFICIENCY, FRACTURES): Vit D, 25-Hydroxy: 21.2 ng/mL — ABNORMAL LOW (ref 30.0–100.0)

## 2012-12-17 ENCOUNTER — Other Ambulatory Visit: Payer: Self-pay | Admitting: Geriatric Medicine

## 2012-12-17 MED ORDER — ALPRAZOLAM 0.5 MG PO TABS: 0.5000 mg | ORAL_TABLET | Freq: Three times a day (TID) | ORAL | Status: DC | PRN

## 2013-01-03 ENCOUNTER — Other Ambulatory Visit: Payer: Self-pay | Admitting: *Deleted

## 2013-01-03 MED ORDER — LEVOTHYROXINE SODIUM 125 MCG PO TABS: 125.0000 ug | ORAL_TABLET | Freq: Every day | ORAL | Status: DC

## 2013-01-29 ENCOUNTER — Other Ambulatory Visit: Payer: Self-pay | Admitting: *Deleted

## 2013-01-29 MED ORDER — ALENDRONATE SODIUM 70 MG PO TABS: ORAL_TABLET | ORAL | Status: DC

## 2013-02-12 ENCOUNTER — Other Ambulatory Visit: Payer: Self-pay

## 2013-02-12 DIAGNOSIS — Z9012 Acquired absence of left breast and nipple: Secondary | ICD-10-CM

## 2013-02-12 DIAGNOSIS — Z1231 Encounter for screening mammogram for malignant neoplasm of breast: Secondary | ICD-10-CM

## 2013-02-28 ENCOUNTER — Encounter: Payer: Self-pay | Admitting: Internal Medicine

## 2013-03-03 ENCOUNTER — Ambulatory Visit
Admission: RE | Admit: 2013-03-03 | Discharge: 2013-03-03 | Disposition: A | Discharge status: Home / Self Care | Payer: Medicare Other | Source: Ambulatory Visit

## 2013-03-03 DIAGNOSIS — Z9012 Acquired absence of left breast and nipple: Secondary | ICD-10-CM

## 2013-03-03 DIAGNOSIS — Z1231 Encounter for screening mammogram for malignant neoplasm of breast: Secondary | ICD-10-CM

## 2013-03-07 ENCOUNTER — Ambulatory Visit: Payer: Medicare Other | Admitting: Internal Medicine

## 2013-03-13 ENCOUNTER — Encounter: Payer: Self-pay | Admitting: *Deleted

## 2013-03-14 ENCOUNTER — Ambulatory Visit (INDEPENDENT_AMBULATORY_CARE_PROVIDER_SITE_OTHER): Payer: Medicare Other | Admitting: Internal Medicine

## 2013-03-14 ENCOUNTER — Encounter: Payer: Self-pay | Admitting: Internal Medicine

## 2013-03-14 VITALS — BP 128/84 | HR 71 | Temp 98.6°F | Wt 168.0 lb

## 2013-03-14 DIAGNOSIS — I10 Essential (primary) hypertension: Secondary | ICD-10-CM

## 2013-03-14 DIAGNOSIS — E785 Hyperlipidemia, unspecified: Secondary | ICD-10-CM

## 2013-03-14 DIAGNOSIS — E039 Hypothyroidism, unspecified: Secondary | ICD-10-CM

## 2013-03-14 DIAGNOSIS — F3289 Other specified depressive episodes: Secondary | ICD-10-CM

## 2013-03-14 DIAGNOSIS — F329 Major depressive disorder, single episode, unspecified: Secondary | ICD-10-CM

## 2013-03-14 DIAGNOSIS — Z853 Personal history of malignant neoplasm of breast: Secondary | ICD-10-CM

## 2013-03-14 DIAGNOSIS — M810 Age-related osteoporosis without current pathological fracture: Secondary | ICD-10-CM

## 2013-03-14 DIAGNOSIS — Z23 Encounter for immunization: Secondary | ICD-10-CM

## 2013-03-14 NOTE — Progress Notes (Signed)
Patient ID: Deborah Randall, female   DOB: 06-21-1940, 72 y.o.   MRN: [id number] Location:  [facility] [place] [facility]  Code Status: thinks she has a living will, but cannot access it at this point.  Son would be Management consultant.  If she was seriously ill like if she had cancer, she would not want to be resuscitated.  Thinks she would want to be full code at this time.  If she would not ever get well, she would not want efforts to be continued.  No Known Allergies  Chief Complaint  Patient presents with  . Medical Managment of Chronic Issues    6 month follow-up   . Medication Management    Discuss Vit D-? if patient should continue with OTC dose or take RX'ed dose     HPI: Patient is a 72 y.o. white female seen in the office today for medical mgt of chronic diseases. Feels pretty well.   Was on the vitamin D 40981 and then stopped it at one point.  Has been taking otc vit D 2000 units daily. Had been working at the [employer] in the summer.   Mood is good she says.   Is taking fosamax for her osteoporosis from 3/12 bone density.  Was to get repeat bone density, but not done with her mammogram which was normal. Has some post nasal drip. Hip that was broken, occasionally aches after sitting in a soft chair, but otherwise no pain.    Review of Systems:  Review of Systems  Constitutional: Negative for fever and chills.  HENT: Negative for congestion.        Some postnasal drip  Eyes: Negative for blurred vision.  Respiratory: Negative for cough and shortness of breath.   Cardiovascular: Negative for chest pain.  Gastrointestinal: Positive for heartburn. Negative for constipation, blood in stool and melena.  Genitourinary: Negative for dysuria, urgency and frequency.  Musculoskeletal: Negative for falls.  Skin: Negative for rash.  Neurological: Negative for dizziness, loss of consciousness, weakness and headaches.  Endo/Heme/Allergies: Does not bruise/bleed  easily.  Psychiatric/Behavioral: Negative for depression and memory loss. The patient is nervous/anxious and has insomnia.      Past Medical History  Diagnosis Date  . Thyroid disease   . Depression   . Unspecified cataract   . Hypertension   . Lumbago   . Senile osteoporosis   . Headache(784.0)   . Nausea alone     Past Surgical History  Procedure Laterality Date  . Thyroid surgery    . Abdominal hysterectomy    . Hernia repair    . Breast surgery      RT & LT w/ Dr Arline Asp    Social History:   reports that she has never smoked. She has never used smokeless tobacco. She reports that she does not drink alcohol or use illicit drugs.  Family History  Problem Relation Age of Onset  . Cancer Father   . Heart attack Brother   . Mental illness Son   . Other Brother   . Other Brother   . Cancer Sister   . Heart disease Sister   . Hypertension Sister   . Other Brother   . Diabetes Brother   . COPD Brother   . Mental illness Son     Medications: Patient's Medications  New Prescriptions   No medications on file  Previous Medications   ALENDRONATE (FOSAMAX) 70 MG TABLET    Take one tablet  by mouth weekly with 8 ounces water, stay upright for 30 minutes after taking   ALPRAZOLAM (XANAX) 0.5 MG TABLET    Take 1 tablet (0.5 mg total) by mouth 3 (three) times daily as needed.   CA PHOSPHATE-CHOLECALCIFEROL (CALTRATE GUMMY BITES) 250-400 MG-UNIT CHEW    Chew 2 each by mouth 2 (two) times daily.   CHOLECALCIFEROL (VITAMIN D3) 2000 UNITS TABS    Take by mouth daily.   DIPHENHYDRAMINE (SOMINEX) 25 MG TABLET    Take 25 mg by mouth at bedtime as needed for sleep. Take 2 tablets once daily   FLAXSEED, LINSEED, (FLAX SEED OIL) 1000 MG CAPS    Take 1,000 mg by mouth. Take two capsules daily   IBUPROFEN (ADVIL,MOTRIN) 200 MG TABLET    Take 200 mg by mouth every 6 (six) hours as needed for pain. Take two tablets as needed for pain.   LEVOTHYROXINE (SYNTHROID, LEVOTHROID) 125 MCG  TABLET    Take 1 tablet (125 mcg total) by mouth daily.   LISINOPRIL (PRINIVIL,ZESTRIL) 40 MG TABLET    Take 1/2 tablet by mouth once daily to control blood pressure.   NAPROXEN SODIUM (ANAPROX) 220 MG TABLET    Take 220 mg by mouth 2 (two) times daily with a meal. Take 1 tablet once daily as needed for pain   RANITIDINE (ZANTAC) 150 MG TABLET    Take 75 mg by mouth daily.    TRIAMTERENE-HYDROCHLOROTHIAZIDE (MAXZIDE-25) 37.5-25 MG PER TABLET    Take 1/2 tablet alternating with 1 tablet daily for blood pressure and edema  Modified Medications   No medications on file  Discontinued Medications   ERGOCALCIFEROL (VITAMIN D2) 50000 UNITS CAPSULE    Take 50,000 Units by mouth once a week. Take one tablet once a week for vitamin D defficency.     Physical Exam: Filed Vitals:   03/14/13 1048  BP: 128/84  Pulse: 71  Temp: 98.6 F (37 C)  TempSrc: Oral  Weight: 168 lb (76.204 kg)  SpO2: 96%  Physical Exam  Constitutional: She is oriented to person, place, and time. She appears well-developed and well-nourished. No distress.  HENT:  Head: Normocephalic and atraumatic.  Neck: Neck supple. No thyromegaly present.  Cardiovascular: Normal rate, regular rhythm, normal heart sounds and intact distal pulses.   No murmur heard. Pulmonary/Chest: Effort normal and breath sounds normal. No respiratory distress.  Abdominal: Soft. Bowel sounds are normal. She exhibits no distension. There is no tenderness.  Musculoskeletal:  Walks with slight limp since hip surgery  Neurological: She is alert and oriented to person, place, and time.  Skin: Skin is warm and dry.  Psychiatric: Thought content normal.  Flat affect, fidgety and anxious hand movements    Labs reviewed: Basic Metabolic Panel:  Recent Labs  16/10/96 1246 09/05/12 0927 09/05/12 1151  NA 132*  --  140  K 4.1  --  4.8  CL 98  --  100  CO2 30*  --  26  GLUCOSE 87  --  93  BUN 10.0  --  13  CREATININE 0.9  --  1.18*  CALCIUM 9.4   --  9.4  TSH  --  3.200  --    Liver Function Tests:  Recent Labs  03/19/12 1246 09/05/12 1151  AST 18 19  ALT 13 13  ALKPHOS 82 64  BILITOT 0.51 0.4  PROT 6.3* 6.5  ALBUMIN 3.7  --   CBC:  Recent Labs  03/19/12 1246 09/05/12 0927  WBC 5.9  5.6  NEUTROABS 3.2 2.8  HGB 13.9 13.6  HCT 41.2 41.7  MCV 86.2 87  PLT 179  --    Lipid Panel:  Recent Labs  09/05/12 0927  HDL 44  LDLCALC 149*  TRIG 171*  CHOLHDL 5.2*   Past Procedures: Mammogram 10/14:  [facility]  Assessment/Plan 1. Need for prophylactic vaccination and inoculation against influenza -received flu shot today  2. Senile osteoporosis -- Vitamin D, 25-hydroxy -continue fosamax -cont vitamin D 2000 units daily as levels returned in normal range today -did not have f/u bone density as ordered  3. Essential hypertension, benign -bp at goal with current therapy  4. Depressive disorder, not elsewhere classified -pt continues to deny this and refuse intervention, but appears depressed each visit -I have discouraged use of xanax and benadryl previously due to risks of falling (previously fractured hip), but she continues to use these to relieve her anxiety and insomnia, but will not take recommended antidepressants  5. Hypothyroidism - TSH--f/u level -last was at goal -clinically euthyroid  6. HX: breast cancer -mammogram was wnl  7. Hyperlipidemia LDL goal < 100 - Lipid panel -last lipids not at goal, but refused medication -continues to use fish oil only  Labs/tests ordered:  Vit D, lipids, tsh Next appt:  6 mos

## 2013-03-15 LAB — LIPID PANEL
Chol/HDL Ratio: 5 ratio units — ABNORMAL HIGH (ref 0.0–4.4)
Cholesterol, Total: 237 mg/dL — ABNORMAL HIGH (ref 100–199)
HDL: 47 mg/dL (ref >39)
LDL Calculated: 151 mg/dL — ABNORMAL HIGH (ref 0–99)
Triglycerides: 197 mg/dL — ABNORMAL HIGH (ref 0–149)
VLDL Cholesterol Cal: 39 mg/dL (ref 5–40)

## 2013-03-15 LAB — TSH: TSH: 1.68 u[IU]/mL (ref 0.450–4.500)

## 2013-03-15 LAB — VITAMIN D 25 HYDROXY (VIT D DEFICIENCY, FRACTURES): Vit D, 25-Hydroxy: 26.9 ng/mL — ABNORMAL LOW (ref 30.0–100.0)

## 2013-04-15 ENCOUNTER — Other Ambulatory Visit: Payer: Self-pay

## 2013-04-15 MED ORDER — LISINOPRIL 40 MG PO TABS: ORAL_TABLET | ORAL | Status: DC

## 2013-08-01 ENCOUNTER — Other Ambulatory Visit: Payer: Self-pay | Admitting: *Deleted

## 2013-08-01 MED ORDER — ALPRAZOLAM 0.5 MG PO TABS: 0.5000 mg | ORAL_TABLET | Freq: Three times a day (TID) | ORAL | Status: DC | PRN

## 2013-08-01 MED ORDER — LEVOTHYROXINE SODIUM 125 MCG PO TABS: 125.0000 ug | ORAL_TABLET | Freq: Every day | ORAL | Status: DC

## 2013-08-01 NOTE — Telephone Encounter (Signed)
[facility] 

## 2013-09-19 ENCOUNTER — Encounter: Payer: Medicare Other | Admitting: Internal Medicine

## 2013-09-25 ENCOUNTER — Encounter: Payer: Self-pay | Admitting: Internal Medicine

## 2013-09-25 ENCOUNTER — Ambulatory Visit (INDEPENDENT_AMBULATORY_CARE_PROVIDER_SITE_OTHER): Payer: Medicare Other | Admitting: Internal Medicine

## 2013-09-25 VITALS — Ht 63.08 in | Wt 178.8 lb

## 2013-09-25 DIAGNOSIS — R7309 Other abnormal glucose: Secondary | ICD-10-CM

## 2013-09-25 DIAGNOSIS — Z13220 Encounter for screening for lipoid disorders: Secondary | ICD-10-CM

## 2013-09-25 DIAGNOSIS — R739 Hyperglycemia, unspecified: Secondary | ICD-10-CM

## 2013-09-25 DIAGNOSIS — Z1211 Encounter for screening for malignant neoplasm of colon: Secondary | ICD-10-CM

## 2013-09-25 DIAGNOSIS — E785 Hyperlipidemia, unspecified: Secondary | ICD-10-CM

## 2013-09-25 DIAGNOSIS — I10 Essential (primary) hypertension: Secondary | ICD-10-CM

## 2013-09-25 DIAGNOSIS — E039 Hypothyroidism, unspecified: Secondary | ICD-10-CM

## 2013-09-25 DIAGNOSIS — F3289 Other specified depressive episodes: Secondary | ICD-10-CM

## 2013-09-25 DIAGNOSIS — E7800 Pure hypercholesterolemia, unspecified: Secondary | ICD-10-CM | POA: Insufficient documentation

## 2013-09-25 DIAGNOSIS — M810 Age-related osteoporosis without current pathological fracture: Secondary | ICD-10-CM

## 2013-09-25 DIAGNOSIS — F329 Major depressive disorder, single episode, unspecified: Secondary | ICD-10-CM

## 2013-09-25 MED ORDER — TETANUS-DIPHTH-ACELL PERTUSSIS 5-2.5-18.5 LF-MCG/0.5 IM SUSP: 0.5000 mL | Freq: Once | INTRAMUSCULAR | Status: DC

## 2013-09-25 MED ORDER — ZOSTER VACCINE LIVE 19400 UNT/0.65ML ~~LOC~~ SOLR: 0.6500 mL | Freq: Once | SUBCUTANEOUS | Status: DC

## 2013-09-25 NOTE — Progress Notes (Signed)
Patient ID: Deborah Randall, female   DOB: Feb 12, 1941, 73 y.o.   MRN: [id number]   Location:  [facility] / [place]  No Known Allergies  Chief Complaint  Patient presents with  . Annual Exam    Yearly exam, EKG updated today, fasting for any necessary labs . MMSE completed today 29/30 (passed clock drawing)   . Orders    Colonoscopy, please schedule for late October 2015     HPI: Patient is a 73 y.o. white female seen in the office today for her annual exam.   Is back working at [employer].    Legs swell and ache at night.  Works everyday and it happens after she is on her feet all day.  Compression hose could help.  Discussed this.    Had a tetanus booster in 2011 when she broke her hip.  Agrees to getting shingles vaccine at pharmacy.    Mood has been fine lately.  Does not sleep well--awakens about an hour after she goes to sleep--gets up several times.  By the time she goes to sleep she has to get up.  Legs aching keeps her awake.  Using naproxen or ibuprofen for pain.  May help a little bit.  Has some compression hose.    Review of Systems:  Review of Systems  Constitutional: Positive for malaise/fatigue. Negative for fever.  HENT: Positive for congestion.        Related to sinus trouble with allergies  Eyes: Negative for blurred vision.       Wears glasses  Respiratory: Negative for shortness of breath.   Cardiovascular: Positive for leg swelling. Negative for chest pain.  Gastrointestinal: Positive for heartburn and nausea. Negative for abdominal pain, constipation, blood in stool and melena.       Using zantac two times a day before breakfast and supper;  Fruit helps keep bowels moving  Genitourinary: Negative for dysuria.  Musculoskeletal: Positive for myalgias.  Skin: Negative for rash.  Neurological: Positive for dizziness and headaches. Negative for loss of consciousness.       Occasionally when nauseated 1-2x per week    Endo/Heme/Allergies: Does not bruise/bleed easily.  Psychiatric/Behavioral: Negative for depression and memory loss. The patient has insomnia.     Past Medical History  Diagnosis Date  . Thyroid disease   . Depression   . Unspecified cataract   . Hypertension   . Lumbago   . Senile osteoporosis   . Headache(784.0)   . Nausea alone     Past Surgical History  Procedure Laterality Date  . Thyroid surgery    . Abdominal hysterectomy    . Hernia repair    . Breast surgery      RT & LT w/ Dr Ralene Ok    Social History:   reports that she has never smoked. She has never used smokeless tobacco. She reports that she does not drink alcohol or use illicit drugs.  Family History  Problem Relation Age of Onset  . Cancer Father   . Heart attack Brother   . Mental illness Son   . Other Brother   . Other Brother   . Cancer Sister   . Heart disease Sister   . Hypertension Sister   . Other Brother   . Diabetes Brother   . COPD Brother   . Mental illness Son     Medications: Patient's Medications  New Prescriptions   No medications on file  Previous Medications  ALENDRONATE (FOSAMAX) 70 MG TABLET    Take one tablet by mouth weekly with 8 ounces water, stay upright for 30 minutes after taking   ALPRAZOLAM (XANAX) 0.5 MG TABLET    Take 1 tablet (0.5 mg total) by mouth 3 (three) times daily as needed. For anxiety   CA PHOSPHATE-CHOLECALCIFEROL (CALTRATE GUMMY BITES) 250-400 MG-UNIT CHEW    Chew 2 each by mouth 2 (two) times daily.   CHOLECALCIFEROL (VITAMIN D3) 2000 UNITS TABS    Take by mouth daily.   DIPHENHYDRAMINE (SOMINEX) 25 MG TABLET    Take 25 mg by mouth at bedtime as needed for sleep. Take 2 tablets once daily   FLAXSEED, LINSEED, (FLAX SEED OIL) 1000 MG CAPS    Take 1,000 mg by mouth. Take two capsules daily   IBUPROFEN (ADVIL,MOTRIN) 200 MG TABLET    Take 200 mg by mouth every 6 (six) hours as needed for pain. Take two tablets as needed for pain.   LEVOTHYROXINE  (SYNTHROID, LEVOTHROID) 125 MCG TABLET    Take 1 tablet (125 mcg total) by mouth daily.   LISINOPRIL (PRINIVIL,ZESTRIL) 40 MG TABLET    Take 1/2 tablet by mouth once daily to control blood pressure.   NAPROXEN SODIUM (ANAPROX) 220 MG TABLET    Take 220 mg by mouth 2 (two) times daily with a meal. Take 1 tablet once daily as needed for pain   RANITIDINE (ZANTAC) 150 MG TABLET    Take 75 mg by mouth daily.    TRIAMTERENE-HYDROCHLOROTHIAZIDE (MAXZIDE-25) 37.5-25 MG PER TABLET    Take 1/2 tablet alternating with 1 tablet daily for blood pressure and edema  Modified Medications   Modified Medication Previous Medication   TDAP (BOOSTRIX) 5-2.5-18.5 LF-MCG/0.5 INJECTION Tdap (BOOSTRIX) 5-2.5-18.5 LF-MCG/0.5 injection      Inject 0.5 mLs into the muscle once.    Inject 0.5 mLs into the muscle once.   ZOSTER VACCINE LIVE, PF, (ZOSTAVAX) 18299 UNT/0.65ML INJECTION zoster vaccine live, PF, (ZOSTAVAX) 37169 UNT/0.65ML injection      Inject 19,400 Units into the skin once.    Inject 0.65 mLs into the skin once.  Discontinued Medications   No medications on file     Physical Exam: Filed Vitals:   09/25/13 0838  Height: 5' 3.08" (1.602 m)  Weight: 178 lb 12.8 oz (81.103 kg)  Physical Exam  Constitutional: She is oriented to person, place, and time. She appears well-developed and well-nourished. No distress.  HENT:  Head: Normocephalic and atraumatic.  Right Ear: External ear normal.  Left Ear: External ear normal.  Nose: Nose normal.  Mouth/Throat: Oropharynx is clear and moist. No oropharyngeal exudate.  Eyes: Conjunctivae and EOM are normal. Pupils are equal, round, and reactive to light.  Neck: Normal range of motion. Neck supple. No JVD present.  Cardiovascular: Normal rate, regular rhythm and intact distal pulses.   Murmur heard. Pulmonary/Chest: Effort normal and breath sounds normal. No respiratory distress.  Abdominal: Soft. Bowel sounds are normal. She exhibits no distension and no mass.  There is no tenderness.  Musculoskeletal: Normal range of motion. She exhibits no edema and no tenderness.  Neurological: She is alert and oriented to person, place, and time. She has normal reflexes.  Skin: Skin is warm and dry.  Psychiatric: She has a normal mood and affect.     Labs reviewed: Basic Metabolic Panel:  Recent Labs  03/14/13 1152  TSH 1.680   Lipid Panel:  Recent Labs  03/14/13 1152  HDL 47  LDLCALC 151*  TRIG 197*  CHOLHDL 5.0*    Assessment/Plan 1. Essential hypertension, benign -bp at goal with current therapy - EKG 12-Lead was normal sinus rhythm at 80 bpm - CBC With differential/Platelet - Comprehensive metabolic panel  2. Senile osteoporosis --cont ca with d  3. Depressive disorder, not elsewhere classified -stable mood--denies depressive symptoms, has flat affect  4. Hypothyroidism -cont synthroid at current dose - TSH  5. Hyperlipidemia LDL goal < 100 -f/u lipids - Lipid panel  6. Hyperglycemia - Comprehensive metabolic panel - Hemoglobin A1c  7.  Preventative care:  Will refer for colonoscopy to be scheduled in late October   Labs/tests ordered:    Orders Placed This Encounter  Procedures  . CBC With differential/Platelet  . Comprehensive metabolic panel  . Hemoglobin A1c  . Lipid panel  . TSH  . EKG 12-Lead    Next appt:  6 mos

## 2013-09-25 NOTE — Progress Notes (Signed)
Passed clock drawing 

## 2013-09-26 LAB — COMPREHENSIVE METABOLIC PANEL
ALT: 10 IU/L (ref 0–32)
AST: 18 IU/L (ref 0–40)
Albumin/Globulin Ratio: 2 (ref 1.1–2.5)
Albumin: 3.9 g/dL (ref 3.5–4.8)
Alkaline Phosphatase: 66 IU/L (ref 39–117)
BUN/Creatinine Ratio: 15 (ref 11–26)
BUN: 17 mg/dL (ref 8–27)
CO2: 22 mmol/L (ref 18–29)
Calcium: 8.6 mg/dL — ABNORMAL LOW (ref 8.7–10.3)
Chloride: 100 mmol/L (ref 97–108)
Creatinine, Ser: 1.17 mg/dL — ABNORMAL HIGH (ref 0.57–1.00)
GFR calc Af Amer: 53 mL/min/{1.73_m2} — ABNORMAL LOW (ref >59)
GFR calc non Af Amer: 46 mL/min/{1.73_m2} — ABNORMAL LOW (ref >59)
Globulin, Total: 2 g/dL (ref 1.5–4.5)
Glucose: 93 mg/dL (ref 65–99)
Potassium: 4.1 mmol/L (ref 3.5–5.2)
Sodium: 137 mmol/L (ref 134–144)
Total Bilirubin: 0.3 mg/dL (ref 0.0–1.2)
Total Protein: 5.9 g/dL — ABNORMAL LOW (ref 6.0–8.5)

## 2013-09-26 LAB — CBC WITH DIFFERENTIAL
Basophils Absolute: 0.1 10*3/uL (ref 0.0–0.2)
Basos: 1 %
Eos: 4 %
Eosinophils Absolute: 0.3 10*3/uL (ref 0.0–0.4)
HCT: 25.8 % — ABNORMAL LOW (ref 34.0–46.6)
Hemoglobin: 7.8 g/dL — ABNORMAL LOW (ref 11.1–15.9)
Immature Grans (Abs): 0 10*3/uL (ref 0.0–0.1)
Immature Granulocytes: 0 %
Lymphocytes Absolute: 2 10*3/uL (ref 0.7–3.1)
Lymphs: 33 %
MCH: 23.6 pg — ABNORMAL LOW (ref 26.6–33.0)
MCHC: 30.2 g/dL — ABNORMAL LOW (ref 31.5–35.7)
MCV: 78 fL — ABNORMAL LOW (ref 79–97)
Monocytes Absolute: 0.7 10*3/uL (ref 0.1–0.9)
Monocytes: 12 %
Neutrophils Absolute: 3 10*3/uL (ref 1.4–7.0)
Neutrophils Relative %: 50 %
Platelets: 236 10*3/uL (ref 150–379)
RBC: 3.31 x10E6/uL — ABNORMAL LOW (ref 3.77–5.28)
RDW: 15.5 % — ABNORMAL HIGH (ref 12.3–15.4)
WBC: 6 10*3/uL (ref 3.4–10.8)

## 2013-09-26 LAB — TSH: TSH: 0.272 u[IU]/mL — ABNORMAL LOW (ref 0.450–4.500)

## 2013-09-26 LAB — LIPID PANEL
Chol/HDL Ratio: 4.4 ratio units (ref 0.0–4.4)
Cholesterol, Total: 161 mg/dL (ref 100–199)
HDL: 37 mg/dL — ABNORMAL LOW (ref >39)
LDL Calculated: 100 mg/dL — ABNORMAL HIGH (ref 0–99)
Triglycerides: 118 mg/dL (ref 0–149)
VLDL Cholesterol Cal: 24 mg/dL (ref 5–40)

## 2013-09-26 LAB — HEMOGLOBIN A1C
Est. average glucose Bld gHb Est-mCnc: 131 mg/dL
Hgb A1c MFr Bld: 6.2 % — ABNORMAL HIGH (ref 4.8–5.6)

## 2013-10-02 ENCOUNTER — Other Ambulatory Visit: Payer: Self-pay

## 2013-10-02 DIAGNOSIS — Z1211 Encounter for screening for malignant neoplasm of colon: Secondary | ICD-10-CM

## 2013-10-02 DIAGNOSIS — D649 Anemia, unspecified: Secondary | ICD-10-CM

## 2013-10-07 ENCOUNTER — Other Ambulatory Visit: Payer: Medicare Other

## 2013-10-07 DIAGNOSIS — Z1211 Encounter for screening for malignant neoplasm of colon: Secondary | ICD-10-CM

## 2013-10-07 DIAGNOSIS — D649 Anemia, unspecified: Secondary | ICD-10-CM

## 2013-10-08 LAB — IRON AND TIBC
Iron Saturation: 3 % — CL (ref 15–55)
Iron: 14 ug/dL — ABNORMAL LOW (ref 35–155)
TIBC: 427 ug/dL (ref 250–450)
UIBC: 413 ug/dL — ABNORMAL HIGH (ref 150–375)

## 2013-10-08 LAB — FERRITIN: Ferritin: 7 ng/mL — ABNORMAL LOW (ref 15–150)

## 2013-10-09 LAB — FECAL OCCULT BLOOD, IMMUNOCHEMICAL: Fecal Occult Bld: NEGATIVE

## 2013-10-14 ENCOUNTER — Other Ambulatory Visit: Payer: Self-pay | Admitting: *Deleted

## 2013-10-14 MED ORDER — LISINOPRIL 40 MG PO TABS: ORAL_TABLET | ORAL | Status: DC

## 2013-10-14 NOTE — Telephone Encounter (Signed)
[facility] 

## 2013-10-31 ENCOUNTER — Other Ambulatory Visit (HOSPITAL_COMMUNITY): Payer: Self-pay | Admitting: Orthopaedic Surgery

## 2013-10-31 DIAGNOSIS — M25551 Pain in right hip: Secondary | ICD-10-CM

## 2013-11-03 ENCOUNTER — Encounter (HOSPITAL_COMMUNITY): Payer: Medicare Other

## 2013-11-03 ENCOUNTER — Ambulatory Visit (HOSPITAL_COMMUNITY): Payer: Medicare Other

## 2013-11-17 ENCOUNTER — Encounter (HOSPITAL_COMMUNITY): Payer: Medicare Other

## 2013-12-01 ENCOUNTER — Ambulatory Visit (HOSPITAL_COMMUNITY): Payer: Medicare Other

## 2013-12-01 ENCOUNTER — Encounter (HOSPITAL_COMMUNITY): Payer: Medicare Other

## 2013-12-04 ENCOUNTER — Other Ambulatory Visit: Payer: Self-pay | Admitting: *Deleted

## 2013-12-04 MED ORDER — ALPRAZOLAM 0.5 MG PO TABS: 0.5000 mg | ORAL_TABLET | Freq: Three times a day (TID) | ORAL | Status: DC | PRN

## 2013-12-04 NOTE — Telephone Encounter (Signed)
[place] 

## 2013-12-08 ENCOUNTER — Encounter (HOSPITAL_COMMUNITY)
Admission: RE | Admit: 2013-12-08 | Discharge: 2013-12-08 | Disposition: A | Discharge status: Home / Self Care | Payer: Medicare Other | Source: Ambulatory Visit | Attending: Orthopaedic Surgery | Admitting: Orthopaedic Surgery

## 2013-12-08 ENCOUNTER — Other Ambulatory Visit (HOSPITAL_COMMUNITY): Payer: Self-pay | Admitting: Orthopaedic Surgery

## 2013-12-08 ENCOUNTER — Ambulatory Visit (HOSPITAL_COMMUNITY)
Admission: RE | Admit: 2013-12-08 | Discharge: 2013-12-08 | Disposition: A | Discharge status: Home / Self Care | Payer: Medicare Other | Source: Ambulatory Visit | Attending: Orthopaedic Surgery | Admitting: Orthopaedic Surgery

## 2013-12-08 DIAGNOSIS — M25552 Pain in left hip: Secondary | ICD-10-CM

## 2013-12-08 DIAGNOSIS — M25559 Pain in unspecified hip: Secondary | ICD-10-CM | POA: Diagnosis present

## 2013-12-08 DIAGNOSIS — Z96649 Presence of unspecified artificial hip joint: Secondary | ICD-10-CM | POA: Diagnosis not present

## 2013-12-08 DIAGNOSIS — C50919 Malignant neoplasm of unspecified site of unspecified female breast: Secondary | ICD-10-CM | POA: Diagnosis not present

## 2013-12-08 DIAGNOSIS — M25551 Pain in right hip: Secondary | ICD-10-CM

## 2013-12-08 MED ORDER — TECHNETIUM TC 99M MEDRONATE IV KIT
25.0000 | PACK | Freq: Once | INTRAVENOUS | Status: AC | PRN
  Administered 2013-12-08: 25 via INTRAVENOUS

## 2013-12-10 ENCOUNTER — Other Ambulatory Visit: Payer: Self-pay | Admitting: *Deleted

## 2013-12-10 MED ORDER — ALPRAZOLAM 0.5 MG PO TABS: 0.5000 mg | ORAL_TABLET | Freq: Three times a day (TID) | ORAL | Status: DC | PRN

## 2013-12-10 NOTE — Telephone Encounter (Signed)
[facility] 

## 2014-02-03 ENCOUNTER — Other Ambulatory Visit: Payer: Self-pay | Admitting: *Deleted

## 2014-02-03 MED ORDER — LEVOTHYROXINE SODIUM 125 MCG PO TABS: 125.0000 ug | ORAL_TABLET | Freq: Every day | ORAL | Status: DC

## 2014-02-03 NOTE — Telephone Encounter (Signed)
[facility] 

## 2014-02-12 ENCOUNTER — Other Ambulatory Visit: Payer: Self-pay

## 2014-02-12 DIAGNOSIS — Z1231 Encounter for screening mammogram for malignant neoplasm of breast: Secondary | ICD-10-CM

## 2014-03-03 ENCOUNTER — Other Ambulatory Visit: Payer: Self-pay | Admitting: *Deleted

## 2014-03-03 DIAGNOSIS — I10 Essential (primary) hypertension: Secondary | ICD-10-CM

## 2014-03-03 MED ORDER — TRIAMTERENE-HCTZ 37.5-25 MG PO TABS: ORAL_TABLET | ORAL | Status: DC

## 2014-03-03 NOTE — Telephone Encounter (Signed)
[facility] 

## 2014-03-04 ENCOUNTER — Ambulatory Visit
Admission: RE | Admit: 2014-03-04 | Discharge: 2014-03-04 | Disposition: A | Discharge status: Home / Self Care | Payer: Medicare Other | Source: Ambulatory Visit

## 2014-03-04 DIAGNOSIS — Z1231 Encounter for screening mammogram for malignant neoplasm of breast: Secondary | ICD-10-CM

## 2014-03-26 ENCOUNTER — Encounter: Payer: Self-pay | Admitting: Internal Medicine

## 2014-03-26 ENCOUNTER — Ambulatory Visit (INDEPENDENT_AMBULATORY_CARE_PROVIDER_SITE_OTHER): Payer: Medicare Other | Admitting: Internal Medicine

## 2014-03-26 ENCOUNTER — Ambulatory Visit (INDEPENDENT_AMBULATORY_CARE_PROVIDER_SITE_OTHER): Payer: Medicare Other | Admitting: *Deleted

## 2014-03-26 VITALS — BP 124/88 | HR 73 | Temp 98.7°F | Resp 10 | Ht 63.5 in | Wt 171.0 lb

## 2014-03-26 DIAGNOSIS — M810 Age-related osteoporosis without current pathological fracture: Secondary | ICD-10-CM

## 2014-03-26 DIAGNOSIS — Z23 Encounter for immunization: Secondary | ICD-10-CM

## 2014-03-26 DIAGNOSIS — E039 Hypothyroidism, unspecified: Secondary | ICD-10-CM

## 2014-03-26 DIAGNOSIS — Z13220 Encounter for screening for lipoid disorders: Secondary | ICD-10-CM

## 2014-03-26 DIAGNOSIS — R739 Hyperglycemia, unspecified: Secondary | ICD-10-CM

## 2014-03-26 DIAGNOSIS — Z1211 Encounter for screening for malignant neoplasm of colon: Secondary | ICD-10-CM

## 2014-03-26 DIAGNOSIS — I10 Essential (primary) hypertension: Secondary | ICD-10-CM

## 2014-03-26 DIAGNOSIS — Z5181 Encounter for therapeutic drug level monitoring: Secondary | ICD-10-CM

## 2014-03-26 NOTE — Progress Notes (Signed)
Patient ID: Deborah Randall, female   DOB: 1941-03-11, 73 y.o.   MRN: [id number]   Location:  [facility] / [facility]  Code Status: is going to change her advance directive so her hcpoa is her granddaughter b/c she thinks her son is no longer fit to make these type of decisions.   No Known Allergies  Chief Complaint  Patient presents with  . Medical Management of Chronic Issues    6 month follow-up, no recent labs   . [employer] Report Completion    Form review and updates  . Results    Discuss genetic test results   . Immunizations    Update flu vaccine    HPI: Patient is a 73 y.o. white female here for med mgt.    Had her anemia last time in May.  Just started to take iron.  Did not go to GI appointment we arranged for her.   Stool stays dark b/c of the iron.  Has never had a cscope.  Does agree to cologuard.  Did not go to eye doctor in 2015, but plans to go  In 2016.    Doesn't use nsaids but 1-2 times per week.    Her son is very sick with anxiety and she worries about him quite a bit.    Hip pain had been bad in July and Dr. Durward Fortes did a bone scan due to her prior breast cancer, but this was negative.  Review of Systems:  Review of Systems  Constitutional: Positive for malaise/fatigue. Negative for fever and chills.  HENT: Negative for congestion.   Eyes: Negative for blurred vision.  Respiratory: Negative for shortness of breath.   Cardiovascular: Positive for leg swelling. Negative for chest pain.       Chronic venous stasis and varicosities  Gastrointestinal: Negative for abdominal pain, constipation, blood in stool and melena.       Dark stool, but taking iron  Genitourinary: Negative for dysuria.  Musculoskeletal: Positive for joint pain. Negative for falls.  Skin: Negative for rash.  Neurological: Negative for dizziness and headaches.  Endo/Heme/Allergies: Bruises/bleeds easily.  Psychiatric/Behavioral: Positive for depression.  Negative for memory loss. The patient is nervous/anxious and has insomnia.        Refuses medications for her mood     Past Medical History  Diagnosis Date  . Thyroid disease   . Depression   . Unspecified cataract   . Hypertension   . Lumbago   . Senile osteoporosis   . Headache(784.0)   . Nausea alone     Past Surgical History  Procedure Laterality Date  . Thyroid surgery    . Abdominal hysterectomy    . Hernia repair    . Breast surgery      RT & LT w/ Dr Ralene Ok    Social History:   reports that she has never smoked. She has never used smokeless tobacco. She reports that she does not drink alcohol or use illicit drugs.  Family History  Problem Relation Age of Onset  . Cancer Father   . Heart attack Brother   . Mental illness Son   . Other Brother   . Other Brother   . Cancer Sister   . Heart disease Sister   . Hypertension Sister   . Other Brother   . Diabetes Brother   . COPD Brother   . Mental illness Son     Medications: Patient's Medications  New Prescriptions   No  medications on file  Previous Medications   ALENDRONATE (FOSAMAX) 70 MG TABLET    Take one tablet by mouth weekly with 8 ounces water, stay upright for 30 minutes after taking   ALPRAZOLAM (XANAX) 0.5 MG TABLET    Take 1 tablet (0.5 mg total) by mouth 3 (three) times daily as needed. For anxiety   CA PHOSPHATE-CHOLECALCIFEROL (CALTRATE GUMMY BITES) 250-400 MG-UNIT CHEW    Chew 2 each by mouth 2 (two) times daily.   CHOLECALCIFEROL (VITAMIN D3) 2000 UNITS TABS    Take by mouth daily.   DIPHENHYDRAMINE (SOMINEX) 25 MG TABLET    Take 25 mg by mouth at bedtime as needed for sleep. Take 1 tablets once daily   FERROUS SULFATE 325 (65 FE) MG TABLET    Take 325 mg by mouth daily with breakfast.   IBUPROFEN (ADVIL,MOTRIN) 200 MG TABLET    Take 200 mg by mouth every 6 (six) hours as needed for pain. Take two tablets as needed for pain.   LEVOTHYROXINE (SYNTHROID, LEVOTHROID) 125 MCG TABLET    Take 1  tablet (125 mcg total) by mouth daily.   LISINOPRIL (PRINIVIL,ZESTRIL) 40 MG TABLET    Take 1/2 tablet by mouth once daily to control blood pressure.   NAPROXEN SODIUM (ANAPROX) 220 MG TABLET    Take 1 tablet once daily as needed for pain   RANITIDINE (ZANTAC) 75 MG TABLET    Take 75 mg by mouth 2 (two) times daily.   TDAP (BOOSTRIX) 5-2.5-18.5 LF-MCG/0.5 INJECTION    Inject 0.5 mLs into the muscle once.   TRIAMTERENE-HYDROCHLOROTHIAZIDE (MAXZIDE-25) 37.5-25 MG PER TABLET    Take 1/2 tablet alternating with 1 tablet daily for blood pressure and edema   ZOSTER VACCINE LIVE, PF, (ZOSTAVAX) 60630 UNT/0.65ML INJECTION    Inject 19,400 Units into the skin once.  Modified Medications   No medications on file  Discontinued Medications   FLAXSEED, LINSEED, (FLAX SEED OIL) 1000 MG CAPS    Take 1,000 mg by mouth. Take two capsules daily   RANITIDINE (ZANTAC) 150 MG TABLET    Take 75 mg by mouth daily.      Physical Exam: Filed Vitals:   03/26/14 0802  BP: 124/88  Pulse: 73  Temp: 98.7 F (37.1 C)  TempSrc: Oral  Resp: 10  Height: 5' 3.5" (1.613 m)  Weight: 171 lb (77.565 kg)  SpO2: 98%  Physical Exam  Constitutional: She is oriented to person, place, and time. She appears well-developed and well-nourished. No distress.  Cardiovascular: Normal rate, regular rhythm, normal heart sounds and intact distal pulses.   Pulmonary/Chest: Effort normal and breath sounds normal. No respiratory distress.  Abdominal: Soft. Bowel sounds are normal. She exhibits no distension and no mass. There is no tenderness.  Musculoskeletal: Normal range of motion. She exhibits no edema or tenderness.  Neurological: She is alert and oriented to person, place, and time.  Skin: Skin is warm and dry.  Psychiatric:  Flat affect, tearful when talking about her son     Labs reviewed: Basic Metabolic Panel:  Recent Labs  09/25/13 0957  NA 137  K 4.1  CL 100  CO2 22  GLUCOSE 93  BUN 17  CREATININE 1.17*    CALCIUM 8.6*  TSH 0.272*   Liver Function Tests:  Recent Labs  09/25/13 0957  AST 18  ALT 10  ALKPHOS 66  BILITOT 0.3  PROT 5.9*   No results for input(s): LIPASE, AMYLASE in the last 8760 hours. No results  for input(s): AMMONIA in the last 8760 hours. CBC:  Recent Labs  09/25/13 0957  WBC 6.0  NEUTROABS 3.0  HGB 7.8*  HCT 25.8*  MCV 78*  PLT 236   Lipid Panel:  Recent Labs  09/25/13 0957  HDL 37*  LDLCALC 100*  TRIG 118  CHOLHDL 4.4   Lab Results  Component Value Date   HGBA1C 6.2* 09/25/2013    Past Procedures: Reviewed imaging from oncology from this summer  Assessment/Plan 1. Senile osteoporosis -cont ca with D and fosamax - Comprehensive metabolic panel  2. Essential hypertension, benign -bp at goal - Comprehensive metabolic panel  3. Hypothyroidism, unspecified hypothyroidism type -thyroid was wnl last time and cont synthroid - TSH   5. Hyperglycemia -f/u lab - Hemoglobin A1c  6. Encounter for screening colonoscopy - agrees to cologuard test, did not ever f/u with GI as recommended for her anemia--hemoccult had been negative and she just started to take iron supplements - CBC With differential/Platelet  7. Encounter for therapeutic drug monitoring -reviewed genetic testing results which were normal - CBC With differential/Platelet  8. Need for prophylactic vaccination and inoculation against influenza -flu shot given  Labs/tests ordered:   Orders Placed This Encounter  Procedures  . CBC With differential/Platelet  . Comprehensive metabolic panel    Order Specific Question:  Has the patient fasted?    Answer:  Yes  . Hemoglobin A1c  . TSH    Next appt:  6 mos for annual exam  Quientin Jent L. Evalyn Shultis, D.O. [facility] [address], [place] [address] Cell Phone (Mon-Fri 8am-5pm):  [phone] On Call:  [phone] & follow prompts after 5pm & weekends Office Phone:   [phone] Office Fax:  [phone]

## 2014-03-27 LAB — COMPREHENSIVE METABOLIC PANEL
ALT: 16 IU/L (ref 0–32)
AST: 24 IU/L (ref 0–40)
Albumin/Globulin Ratio: 2 (ref 1.1–2.5)
Albumin: 4.5 g/dL (ref 3.5–4.8)
Alkaline Phosphatase: 73 IU/L (ref 39–117)
BUN/Creatinine Ratio: 12 (ref 11–26)
BUN: 14 mg/dL (ref 8–27)
CO2: 28 mmol/L (ref 18–29)
Calcium: 10.1 mg/dL (ref 8.7–10.3)
Chloride: 98 mmol/L (ref 97–108)
Creatinine, Ser: 1.2 mg/dL — ABNORMAL HIGH (ref 0.57–1.00)
GFR calc Af Amer: 51 mL/min/{1.73_m2} — ABNORMAL LOW (ref >59)
GFR calc non Af Amer: 45 mL/min/{1.73_m2} — ABNORMAL LOW (ref >59)
Globulin, Total: 2.2 g/dL (ref 1.5–4.5)
Glucose: 104 mg/dL — ABNORMAL HIGH (ref 65–99)
Potassium: 5.6 mmol/L — ABNORMAL HIGH (ref 3.5–5.2)
Sodium: 141 mmol/L (ref 134–144)
Total Bilirubin: 0.5 mg/dL (ref 0.0–1.2)
Total Protein: 6.7 g/dL (ref 6.0–8.5)

## 2014-03-27 LAB — CBC WITH DIFFERENTIAL
Basophils Absolute: 0.1 10*3/uL (ref 0.0–0.2)
Basos: 1 %
Eos: 3 %
Eosinophils Absolute: 0.2 10*3/uL (ref 0.0–0.4)
HCT: 41.5 % (ref 34.0–46.6)
Hemoglobin: 14.6 g/dL (ref 11.1–15.9)
Immature Grans (Abs): 0 10*3/uL (ref 0.0–0.1)
Immature Granulocytes: 0 %
Lymphocytes Absolute: 2.5 10*3/uL (ref 0.7–3.1)
Lymphs: 33 %
MCH: 29.8 pg (ref 26.6–33.0)
MCHC: 35.2 g/dL (ref 31.5–35.7)
MCV: 85 fL (ref 79–97)
Monocytes Absolute: 0.7 10*3/uL (ref 0.1–0.9)
Monocytes: 9 %
Neutrophils Absolute: 4.1 10*3/uL (ref 1.4–7.0)
Neutrophils Relative %: 54 %
Platelets: 206 10*3/uL (ref 150–379)
RBC: 4.9 x10E6/uL (ref 3.77–5.28)
RDW: 13.7 % (ref 12.3–15.4)
WBC: 7.6 10*3/uL (ref 3.4–10.8)

## 2014-03-27 LAB — TSH: TSH: 10 u[IU]/mL — ABNORMAL HIGH (ref 0.450–4.500)

## 2014-03-27 LAB — HEMOGLOBIN A1C
Est. average glucose Bld gHb Est-mCnc: 123 mg/dL
Hgb A1c MFr Bld: 5.9 % — ABNORMAL HIGH (ref 4.8–5.6)

## 2014-03-30 ENCOUNTER — Encounter: Payer: Self-pay | Admitting: *Deleted

## 2014-03-31 LAB — T4, FREE: Free T4: 1.57 ng/dL (ref 0.82–1.77)

## 2014-03-31 LAB — SPECIMEN STATUS REPORT

## 2014-04-02 ENCOUNTER — Encounter: Payer: Self-pay | Admitting: Internal Medicine

## 2014-04-02 ENCOUNTER — Ambulatory Visit (INDEPENDENT_AMBULATORY_CARE_PROVIDER_SITE_OTHER): Payer: Medicare Other | Admitting: Internal Medicine

## 2014-04-02 VITALS — BP 120/70 | HR 78 | Temp 98.4°F | Ht 63.5 in | Wt 169.6 lb

## 2014-04-02 DIAGNOSIS — E038 Other specified hypothyroidism: Secondary | ICD-10-CM

## 2014-04-02 DIAGNOSIS — F32A Depression, unspecified: Secondary | ICD-10-CM

## 2014-04-02 DIAGNOSIS — F329 Major depressive disorder, single episode, unspecified: Secondary | ICD-10-CM

## 2014-04-02 DIAGNOSIS — D509 Iron deficiency anemia, unspecified: Secondary | ICD-10-CM

## 2014-04-02 MED ORDER — LEVOTHYROXINE SODIUM 137 MCG PO TABS: 137.0000 ug | ORAL_TABLET | Freq: Every day | ORAL | Status: DC

## 2014-04-02 NOTE — Progress Notes (Signed)
Patient ID: Deborah Randall, female   DOB: 1940/05/25, 73 y.o.   MRN: [id number]   Location:  [facility] / [place]  No Known Allergies  Chief Complaint  Patient presents with  . Follow-up    Follow up, Discuss Labs (copy printed)    HPI: Patient is a 73 y.o. white female seen in the office today to discuss lab results.  Her TSH was very high.  She is on 177mcg of synthroid.  I was concerned aobut adrenal insuffiency due to her hyperpigmentation and depression.     She is going to do the cologuard test today.  It came in the mail yesterday.  She's happy that her granddaughter and her husband are going to adopt a baby in dec(had 2 miscarriages).    Review of Systems:  Review of Systems  Constitutional: Positive for malaise/fatigue. Negative for fever.  HENT: Negative for congestion.   Respiratory: Negative for shortness of breath.   Cardiovascular: Negative for chest pain.  Gastrointestinal: Negative for abdominal pain, constipation, blood in stool and melena.  Genitourinary: Negative for dysuria.  Musculoskeletal: Negative for falls.  Skin: Negative for rash.  Neurological: Negative for dizziness.  Psychiatric/Behavioral: Negative for memory loss. The patient is nervous/anxious.        "I worry a lot"     Past Medical History  Diagnosis Date  . Thyroid disease   . Depression   . Unspecified cataract   . Hypertension   . Lumbago   . Senile osteoporosis   . Headache(784.0)   . Nausea alone     Past Surgical History  Procedure Laterality Date  . Thyroid surgery    . Abdominal hysterectomy    . Hernia repair    . Breast surgery      RT & LT w/ Dr Ralene Ok    Social History:   reports that she has never smoked. She has never used smokeless tobacco. She reports that she does not drink alcohol or use illicit drugs.  Family History  Problem Relation Age of Onset  . Cancer Father   . Heart attack Brother   . Mental illness Son   . Other  Brother   . Other Brother   . Cancer Sister   . Heart disease Sister   . Hypertension Sister   . Other Brother   . Diabetes Brother   . COPD Brother   . Mental illness Son     Medications: Patient's Medications  New Prescriptions   No medications on file  Previous Medications   ALENDRONATE (FOSAMAX) 70 MG TABLET    Take one tablet by mouth weekly with 8 ounces water, stay upright for 30 minutes after taking   ALPRAZOLAM (XANAX) 0.5 MG TABLET    Take 1 tablet (0.5 mg total) by mouth 3 (three) times daily as needed. For anxiety   CA PHOSPHATE-CHOLECALCIFEROL (CALTRATE GUMMY BITES) 250-400 MG-UNIT CHEW    Chew 2 each by mouth 2 (two) times daily.   CHOLECALCIFEROL (VITAMIN D3) 2000 UNITS TABS    Take by mouth daily.   DIPHENHYDRAMINE (SOMINEX) 25 MG TABLET    Take 25 mg by mouth at bedtime as needed for sleep. Take 1 tablets once daily   IBUPROFEN (ADVIL,MOTRIN) 200 MG TABLET    Take 200 mg by mouth every 6 (six) hours as needed for pain. Take two tablets as needed for pain.   LEVOTHYROXINE (SYNTHROID, LEVOTHROID) 125 MCG TABLET    Take 1 tablet (125  mcg total) by mouth daily.   LISINOPRIL (PRINIVIL,ZESTRIL) 40 MG TABLET    Take 1/2 tablet by mouth once daily to control blood pressure.   NAPROXEN SODIUM (ANAPROX) 220 MG TABLET    Take 1 tablet once daily as needed for pain   RANITIDINE (ZANTAC) 75 MG TABLET    Take 75 mg by mouth 2 (two) times daily as needed (For Heartburn).    TDAP (BOOSTRIX) 5-2.5-18.5 LF-MCG/0.5 INJECTION    Inject 0.5 mLs into the muscle once.   TRIAMTERENE-HYDROCHLOROTHIAZIDE (MAXZIDE-25) 37.5-25 MG PER TABLET    Take 1/2 tablet alternating with 1 tablet daily for blood pressure and edema   ZOSTER VACCINE LIVE, PF, (ZOSTAVAX) 84696 UNT/0.65ML INJECTION    Inject 19,400 Units into the skin once.  Modified Medications   No medications on file  Discontinued Medications   FERROUS SULFATE 325 (65 FE) MG TABLET    Take 325 mg by mouth daily with breakfast.      Physical Exam: Filed Vitals:   04/02/14 1139  BP: 120/70  Pulse: 78  Temp: 98.4 F (36.9 C)  TempSrc: Oral  Height: 5' 3.5" (1.613 m)  Weight: 169 lb 9.6 oz (76.93 kg)  SpO2: 99%  Physical Exam  Constitutional: She is oriented to person, place, and time. She appears well-developed and well-nourished. No distress.  Cardiovascular: Normal rate, regular rhythm, normal heart sounds and intact distal pulses.   Pulmonary/Chest: Effort normal and breath sounds normal. No respiratory distress.  Musculoskeletal: Normal range of motion.  Neurological: She is alert and oriented to person, place, and time.  Skin:  Very tan  Psychiatric:  Flat affect    Labs reviewed: Basic Metabolic Panel:  Recent Labs  09/25/13 0957 03/26/14 0859  NA 137 141  K 4.1 5.6*  CL 100 98  CO2 22 28  GLUCOSE 93 104*  BUN 17 14  CREATININE 1.17* 1.20*  CALCIUM 8.6* 10.1  TSH 0.272* 10.000*   Liver Function Tests:  Recent Labs  09/25/13 0957 03/26/14 0859  AST 18 24  ALT 10 16  ALKPHOS 66 73  BILITOT 0.3 0.5  PROT 5.9* 6.7   No results for input(s): LIPASE, AMYLASE in the last 8760 hours. No results for input(s): AMMONIA in the last 8760 hours. CBC:  Recent Labs  09/25/13 0957 03/26/14 0859  WBC 6.0 7.6  NEUTROABS 3.0 4.1  HGB 7.8* 14.6  HCT 25.8* 41.5  MCV 78* 85  PLT 236 206   Lipid Panel:  Recent Labs  09/25/13 0957  HDL 37*  LDLCALC 100*  TRIG 118  CHOLHDL 4.4   Lab Results  Component Value Date   HGBA1C 5.9* 03/26/2014   Lab Results  Component Value Date   TSH 10.000* 03/26/2014   Assessment/Plan 1. Other specified hypothyroidism -increase synthroid from 160mcg to 137 and recheck at next visit - levothyroxine (SYNTHROID, LEVOTHROID) 137 MCG tablet; Take 1 tablet (137 mcg total) by mouth daily before breakfast.  Dispense: 30 tablet; Refill: 3 -I question if she could have adrenal insuffiency causing this given depression and hyperpigmentation--does not  seem to ever lose tan from summers at [employer]  2. Anemia, iron deficiency -anemia resolved with iron use  3. Depression -says she just worries a lot about her son and her family -has refused drug therapy for this and counseling I have recommended in the past -if thyroid remains difficult to control, will consider adrenal insufficency workup  Labs/tests ordered:  No new  Next appt: May  Litzi Binning  Lynelle Doctor, D.O. [facility] [address],  [address] Cell Phone (Mon-Fri 8am-5pm):  [phone] On Call:  [phone] & follow prompts after 5pm & weekends Office Phone:  [phone] Office Fax:  [phone]

## 2014-04-15 LAB — COLOGUARD

## 2014-04-15 LAB — FECAL OCCULT BLOOD, GUAIAC: Fecal Occult Blood: NEGATIVE

## 2014-05-04 ENCOUNTER — Encounter: Payer: Self-pay | Admitting: Internal Medicine

## 2014-07-02 ENCOUNTER — Encounter: Payer: Self-pay | Admitting: Internal Medicine

## 2014-07-02 ENCOUNTER — Ambulatory Visit (INDEPENDENT_AMBULATORY_CARE_PROVIDER_SITE_OTHER): Payer: Commercial Managed Care - HMO | Admitting: Internal Medicine

## 2014-07-02 VITALS — BP 130/80 | HR 75 | Temp 97.9°F | Resp 18 | Ht 63.5 in | Wt 170.4 lb

## 2014-07-02 DIAGNOSIS — C44311 Basal cell carcinoma of skin of nose: Secondary | ICD-10-CM | POA: Diagnosis not present

## 2014-07-02 DIAGNOSIS — Z1211 Encounter for screening for malignant neoplasm of colon: Secondary | ICD-10-CM

## 2014-07-02 DIAGNOSIS — I10 Essential (primary) hypertension: Secondary | ICD-10-CM | POA: Diagnosis not present

## 2014-07-02 DIAGNOSIS — F329 Major depressive disorder, single episode, unspecified: Secondary | ICD-10-CM | POA: Diagnosis not present

## 2014-07-02 DIAGNOSIS — F32A Depression, unspecified: Secondary | ICD-10-CM

## 2014-07-02 DIAGNOSIS — E039 Hypothyroidism, unspecified: Secondary | ICD-10-CM

## 2014-07-02 MED ORDER — ALENDRONATE SODIUM 70 MG PO TABS: ORAL_TABLET | ORAL | Status: DC

## 2014-07-02 MED ORDER — SERTRALINE HCL 25 MG PO TABS: ORAL_TABLET | ORAL | Status: DC

## 2014-07-02 NOTE — Progress Notes (Signed)
Patient ID: Deborah Randall, female   DOB: [birth year], 74 y.o.   MRN: [id number]   Location:  [facility] / [place]  No Known Allergies  Chief Complaint  Patient presents with  . Medical Management of Chronic Issues    spot on nose and under arms, back chest area  . Dementia    HPI: Patient is a 74 y.o. white female seen in the office today for medical management followup.  States she has been well since last visit.   She would like a referral to a dermatologist to check a place on her nose and a few moles under her arm and on her back. She has seen Dr. Amy Martinique in the past and would like to see her again.  Has been taking synthroid. Denies tremors, agitation. Appetite remains good. Weight is stable.   States her mood is ok. She is tearful talking about her granddaughter who is trying to adopt a baby, but there are some problems with the adoption, the baby is in NICU, possiby experiencing drug withdrawals.   Also has a son who has been sick for several years, had pneumonia several years ago and hasn't ever fully recovered and she is worried about him. He lives behind her. He is housebound.   Additionally, she has another son who commited suicide a few years ago. She admits that she has experienced much stress, and is very tearful during our discussion.  Review of Systems:  Review of Systems  Constitutional: Negative for fever, weight loss and malaise/fatigue.  Respiratory: Positive for cough.   Cardiovascular: Positive for chest pain, palpitations and orthopnea.  Skin: Negative for itching and rash.  Neurological: Negative for dizziness, tremors and headaches.  Psychiatric/Behavioral: Positive for depression. Negative for suicidal ideas. The patient is nervous/anxious.      Past Medical History  Diagnosis Date  . Thyroid disease   . Depression   . Unspecified cataract   . Hypertension   . Lumbago   . Senile osteoporosis   . Headache(784.0)     . Nausea alone     Past Surgical History  Procedure Laterality Date  . Thyroid surgery    . Abdominal hysterectomy    . Hernia repair    . Breast surgery      RT & LT w/ Dr Ralene Ok    Social History:   reports that she has never smoked. She has never used smokeless tobacco. She reports that she does not drink alcohol or use illicit drugs.  Family History  Problem Relation Age of Onset  . Cancer Father   . Heart attack Brother   . Mental illness Son   . Other Brother   . Other Brother   . Cancer Sister   . Heart disease Sister   . Hypertension Sister   . Other Brother   . Diabetes Brother   . COPD Brother   . Mental illness Son     Medications: Patient's Medications  New Prescriptions   No medications on file  Previous Medications   ALENDRONATE (FOSAMAX) 70 MG TABLET    Take one tablet by mouth weekly with 8 ounces water, stay upright for 30 minutes after taking   ALPRAZOLAM (XANAX) 0.5 MG TABLET    Take 1 tablet (0.5 mg total) by mouth 3 (three) times daily as needed. For anxiety   CA PHOSPHATE-CHOLECALCIFEROL (CALTRATE GUMMY BITES) 250-400 MG-UNIT CHEW    Chew 2 each by mouth 2 (two) times daily.  CHOLECALCIFEROL (VITAMIN D3) 2000 UNITS TABS    Take by mouth daily.   DIPHENHYDRAMINE (SOMINEX) 25 MG TABLET    Take 25 mg by mouth at bedtime as needed for sleep. Take 1 tablets once daily   IBUPROFEN (ADVIL,MOTRIN) 200 MG TABLET    Take 200 mg by mouth every 6 (six) hours as needed for pain. Take two tablets as needed for pain.   LEVOTHYROXINE (SYNTHROID, LEVOTHROID) 137 MCG TABLET    Take 1 tablet (137 mcg total) by mouth daily before breakfast.   LISINOPRIL (PRINIVIL,ZESTRIL) 40 MG TABLET    Take 1/2 tablet by mouth once daily to control blood pressure.   NAPROXEN SODIUM (ANAPROX) 220 MG TABLET    Take 1 tablet once daily as needed for pain   RANITIDINE (ZANTAC) 75 MG TABLET    Take 75 mg by mouth 2 (two) times daily as needed (For Heartburn).    TDAP (BOOSTRIX)  5-2.5-18.5 LF-MCG/0.5 INJECTION    Inject 0.5 mLs into the muscle once.   TRIAMTERENE-HYDROCHLOROTHIAZIDE (MAXZIDE-25) 37.5-25 MG PER TABLET    Take 1/2 tablet alternating with 1 tablet daily for blood pressure and edema   ZOSTER VACCINE LIVE, PF, (ZOSTAVAX) 62035 UNT/0.65ML INJECTION    Inject 19,400 Units into the skin once.  Modified Medications   No medications on file  Discontinued Medications   No medications on file     Physical Exam: Filed Vitals:   07/02/14 1323  BP: 130/80  Pulse: 75  Temp: 97.9 F (36.6 C)  TempSrc: Oral  Resp: 18  Height: 5' 3.5" (1.613 m)  Weight: 170 lb 6.4 oz (77.293 kg)  SpO2: 97%   Physical Exam  Constitutional: She is oriented to person, place, and time. She appears well-developed and well-nourished.  HENT:  Head: Normocephalic and atraumatic.  Cardiovascular: Normal rate, regular rhythm and normal heart sounds.   Pulmonary/Chest: Effort normal and breath sounds normal.  Abdominal: Soft. Bowel sounds are normal.  Neurological: She is alert and oriented to person, place, and time.  Skin: Skin is warm and dry.  Flesh colored nodule on left of nose with center indentation. No erythema. Multiple moles on back, a few are very dark and scaly. One mole under her arm is dark and she states it irritates her.  Psychiatric: Thought content normal.  Flat affect, labile    Labs reviewed: Basic Metabolic Panel:  Recent Labs  09/25/13 0957 03/26/14 0859  NA 137 141  K 4.1 5.6*  CL 100 98  CO2 22 28  GLUCOSE 93 104*  BUN 17 14  CREATININE 1.17* 1.20*  CALCIUM 8.6* 10.1  TSH 0.272* 10.000*   Liver Function Tests:  Recent Labs  09/25/13 0957 03/26/14 0859  AST 18 24  ALT 10 16  ALKPHOS 66 73  BILITOT 0.3 0.5  PROT 5.9* 6.7   No results for input(s): LIPASE, AMYLASE in the last 8760 hours. No results for input(s): AMMONIA in the last 8760 hours. CBC:  Recent Labs  09/25/13 0957 03/26/14 0859  WBC 6.0 7.6  NEUTROABS 3.0 4.1    HGB 7.8* 14.6  HCT 25.8* 41.5  MCV 78* 85  PLT 236 206   Lipid Panel:  Recent Labs  09/25/13 0957  HDL 37*  LDLCALC 100*  TRIG 118  CHOLHDL 4.4   Lab Results  Component Value Date   HGBA1C 5.9* 03/26/2014    Assessment/Plan 1. Essential hypertension, benign Controlled. Continue current medications--triamterene/hctz.    2. Hypothyroidism, unspecified hypothyroidism type Will recheck  TSH. Continue Synthroid, will adjust as needed.  3. Depression Agrees to try zoloft -will take 25mg  daily for 2 wks, then go up to 50mg  daily thereafter and f/u with me in 1 month  4.  Basal cell carcinoma of nose -suspected -previously had skin cancer on her forehead removed -sees Dr. Amy Martinique  5.  Screening for colon cancer: cologuard was done and negative since last visit.  Labs/tests ordered:   Orders Placed This Encounter  Procedures  . TSH  . Ambulatory referral to Dermatology    Referral Priority:  Routine    Referral Type:  Consultation    Referral Reason:  Specialty Services Required    Requested Specialty:  Dermatology    Number of Visits Requested:  1    Next appt:  1 month f/u on depression  Samai Corea L. Brytnee Bechler, D.O. [facility] [address], [place] [address] Cell Phone (Mon-Fri 8am-5pm):  [phone] On Call:  [phone] & follow prompts after 5pm & weekends Office Phone:  [phone] Office Fax:  [phone]

## 2014-07-03 LAB — TSH: TSH: 1.39 u[IU]/mL (ref 0.450–4.500)

## 2014-07-09 ENCOUNTER — Other Ambulatory Visit: Payer: Self-pay | Admitting: *Deleted

## 2014-07-09 DIAGNOSIS — F32A Depression, unspecified: Secondary | ICD-10-CM

## 2014-07-09 DIAGNOSIS — F329 Major depressive disorder, single episode, unspecified: Secondary | ICD-10-CM

## 2014-07-09 MED ORDER — SERTRALINE HCL 25 MG PO TABS: ORAL_TABLET | ORAL | Status: DC

## 2014-07-09 NOTE — Telephone Encounter (Signed)
[facility] 

## 2014-07-21 ENCOUNTER — Other Ambulatory Visit: Payer: Self-pay | Admitting: Dermatology

## 2014-07-21 DIAGNOSIS — L820 Inflamed seborrheic keratosis: Secondary | ICD-10-CM | POA: Diagnosis not present

## 2014-07-21 DIAGNOSIS — L821 Other seborrheic keratosis: Secondary | ICD-10-CM | POA: Diagnosis not present

## 2014-07-21 DIAGNOSIS — D485 Neoplasm of uncertain behavior of skin: Secondary | ICD-10-CM | POA: Diagnosis not present

## 2014-07-21 DIAGNOSIS — L814 Other melanin hyperpigmentation: Secondary | ICD-10-CM | POA: Diagnosis not present

## 2014-07-21 DIAGNOSIS — Z85828 Personal history of other malignant neoplasm of skin: Secondary | ICD-10-CM | POA: Diagnosis not present

## 2014-07-21 DIAGNOSIS — D2262 Melanocytic nevi of left upper limb, including shoulder: Secondary | ICD-10-CM | POA: Diagnosis not present

## 2014-07-21 DIAGNOSIS — L570 Actinic keratosis: Secondary | ICD-10-CM | POA: Diagnosis not present

## 2014-07-21 DIAGNOSIS — I788 Other diseases of capillaries: Secondary | ICD-10-CM | POA: Diagnosis not present

## 2014-08-03 ENCOUNTER — Ambulatory Visit (INDEPENDENT_AMBULATORY_CARE_PROVIDER_SITE_OTHER): Payer: Commercial Managed Care - HMO | Admitting: Internal Medicine

## 2014-08-03 ENCOUNTER — Encounter: Payer: Self-pay | Admitting: Internal Medicine

## 2014-08-03 VITALS — BP 136/78 | HR 74 | Temp 98.1°F | Ht 64.0 in | Wt 170.2 lb

## 2014-08-03 DIAGNOSIS — Z23 Encounter for immunization: Secondary | ICD-10-CM | POA: Diagnosis not present

## 2014-08-03 DIAGNOSIS — E038 Other specified hypothyroidism: Secondary | ICD-10-CM

## 2014-08-03 DIAGNOSIS — F32A Depression, unspecified: Secondary | ICD-10-CM

## 2014-08-03 DIAGNOSIS — J302 Other seasonal allergic rhinitis: Secondary | ICD-10-CM | POA: Diagnosis not present

## 2014-08-03 DIAGNOSIS — F329 Major depressive disorder, single episode, unspecified: Secondary | ICD-10-CM

## 2014-08-03 DIAGNOSIS — J309 Allergic rhinitis, unspecified: Secondary | ICD-10-CM | POA: Insufficient documentation

## 2014-08-03 MED ORDER — ALPRAZOLAM 0.5 MG PO TABS: 0.5000 mg | ORAL_TABLET | Freq: Three times a day (TID) | ORAL | Status: DC | PRN

## 2014-08-03 MED ORDER — SERTRALINE HCL 25 MG PO TABS: ORAL_TABLET | ORAL | Status: DC

## 2014-08-03 MED ORDER — LEVOTHYROXINE SODIUM 137 MCG PO TABS: 137.0000 ug | ORAL_TABLET | Freq: Every day | ORAL | Status: DC

## 2014-08-03 NOTE — Addendum Note (Signed)
Addended by: Eilene Ghazi on: 08/03/2014 02:15 PM   Modules accepted: Orders

## 2014-08-03 NOTE — Progress Notes (Signed)
Patient ID: Deborah Randall, female   DOB: 28-Nov-1940, 73 y.o.   MRN: [id number]   Location:  [facility] / [place] [facility]  Code Status: Full Code  No Known Allergies  Chief Complaint  Patient presents with  . Medical Management of Chronic Issues    1 month follow for depression    HPI: Patient is a 74 y.o. female seen in the office today for recheck following initiation of antidepressant medication.   She feels she has had an improvement in her mood since last visit. A little calmer. Her Granddaughter came home from [place] with her new Isidor Holts and she is very happy about that. Her ex daughter in law has also noted an improvement in her mood as well. No noticeable side effects and no suicidal ideations.   Left eye is bothering her, and her nose is running today. She has had seasonal allergies in the past and has used Benadryl with relief and no complications, doesn't make her drowsy. Denies crusting or exudate. No sinus congestion. Does note an occasional dry cough that isn't bothering her. No fever or chills.     Review of Systems:  Review of Systems  Constitutional: Negative for fever, chills and malaise/fatigue.  Eyes: Positive for redness.  Respiratory: Positive for cough. Negative for sputum production, shortness of breath and wheezing.   Cardiovascular: Negative for chest pain and palpitations.  Gastrointestinal: Negative for heartburn, nausea, vomiting and abdominal pain.  Neurological: Negative for headaches.  Psychiatric/Behavioral: Negative for depression and suicidal ideas. The patient is not nervous/anxious.      Past Medical History  Diagnosis Date  . Thyroid disease   . Depression   . Unspecified cataract   . Hypertension   . Lumbago   . Senile osteoporosis   . Headache(784.0)   . Nausea alone     Past Surgical History  Procedure Laterality Date  . Thyroid surgery    . Abdominal hysterectomy    . Hernia repair    .  Breast surgery      RT & LT w/ Dr Ralene Ok    Social History:   reports that she has never smoked. She has never used smokeless tobacco. She reports that she does not drink alcohol or use illicit drugs.  Family History  Problem Relation Age of Onset  . Cancer Father   . Heart attack Brother   . Mental illness Son   . Other Brother   . Other Brother   . Cancer Sister   . Heart disease Sister   . Hypertension Sister   . Other Brother   . Diabetes Brother   . COPD Brother   . Mental illness Son     Medications: Patient's Medications  New Prescriptions   No medications on file  Previous Medications   ALENDRONATE (FOSAMAX) 70 MG TABLET    Take one tablet by mouth weekly with 8 ounces water, stay upright for 30 minutes after taking, For Osteoporosis   ALPRAZOLAM (XANAX) 0.5 MG TABLET    Take 1 tablet (0.5 mg total) by mouth 3 (three) times daily as needed. For anxiety   CA PHOSPHATE-CHOLECALCIFEROL (CALTRATE GUMMY BITES) 250-400 MG-UNIT CHEW    Chew 2 each by mouth 2 (two) times daily.   CHOLECALCIFEROL (VITAMIN D3) 2000 UNITS TABS    Take by mouth daily.   DIPHENHYDRAMINE (SOMINEX) 25 MG TABLET    Take 25 mg by mouth at bedtime as needed for sleep. Take 1 tablets once  daily   LEVOTHYROXINE (SYNTHROID, LEVOTHROID) 137 MCG TABLET    Take 1 tablet (137 mcg total) by mouth daily before breakfast.   LISINOPRIL (PRINIVIL,ZESTRIL) 40 MG TABLET    Take 1/2 tablet by mouth once daily to control blood pressure.   RANITIDINE (ZANTAC) 75 MG TABLET    Take 75 mg by mouth 2 (two) times daily as needed (For Heartburn).    SERTRALINE (ZOLOFT) 25 MG TABLET    1 tablet by mouth daily for 2 weeks, then 2 tablets by mouth daily thereafter   TDAP (BOOSTRIX) 5-2.5-18.5 LF-MCG/0.5 INJECTION    Inject 0.5 mLs into the muscle once.   TRIAMTERENE-HYDROCHLOROTHIAZIDE (MAXZIDE-25) 37.5-25 MG PER TABLET    Take 1/2 tablet alternating with 1 tablet daily for blood pressure and edema   ZOSTER VACCINE LIVE, PF,  (ZOSTAVAX) 10175 UNT/0.65ML INJECTION    Inject 19,400 Units into the skin once.  Modified Medications   No medications on file  Discontinued Medications   No medications on file     Physical Exam: Filed Vitals:   08/03/14 1315  BP: 136/78  Pulse: 74  Temp: 98.1 F (36.7 C)  TempSrc: Oral  Height: 5\' 4"  (1.626 m)  Weight: 170 lb 3.2 oz (77.202 kg)  SpO2: 99%   Physical Exam  Constitutional: She is oriented to person, place, and time. She appears well-developed and well-nourished.  Eyes: Right eye exhibits no discharge. Left eye exhibits no discharge.  Both eyes mildly injected  Cardiovascular: Normal rate, regular rhythm and normal heart sounds.   Pulmonary/Chest: Effort normal and breath sounds normal.  Neurological: She is alert and oriented to person, place, and time.  Skin: Skin is warm and dry.  Psychiatric: She has a normal mood and affect. Her behavior is normal.  Vitals reviewed.   Labs reviewed: Basic Metabolic Panel:  Recent Labs  09/25/13 0957 03/26/14 0859 07/02/14 1419  NA 137 141  --   K 4.1 5.6*  --   CL 100 98  --   CO2 22 28  --   GLUCOSE 93 104*  --   BUN 17 14  --   CREATININE 1.17* 1.20*  --   CALCIUM 8.6* 10.1  --   TSH 0.272* 10.000* 1.390   Liver Function Tests:  Recent Labs  09/25/13 0957 03/26/14 0859  AST 18 24  ALT 10 16  ALKPHOS 66 73  BILITOT 0.3 0.5  PROT 5.9* 6.7   No results for input(s): LIPASE, AMYLASE in the last 8760 hours. No results for input(s): AMMONIA in the last 8760 hours. CBC:  Recent Labs  09/25/13 0957 03/26/14 0859  WBC 6.0 7.6  NEUTROABS 3.0 4.1  HGB 7.8* 14.6  HCT 25.8* 41.5  MCV 78* 85  PLT 236 206   Lipid Panel:  Recent Labs  09/25/13 0957  CHOL 161  HDL 37*  LDLCALC 100*  TRIG 118  CHOLHDL 4.4   Lab Results  Component Value Date   HGBA1C 5.9* 03/26/2014   Assessment/Plan 1. Other specified hypothyroidism TSH stable- continue synthroid - levothyroxine (SYNTHROID,  LEVOTHROID) 137 MCG tablet; Take 1 tablet (137 mcg total) by mouth daily before breakfast.  Dispense: 30 tablet; Refill: 3  2. Depression Improved with Zoloft. Cont 50mg  dose. - sertraline (ZOLOFT) 25 MG tablet; 2 tablets by mouth daily for depression  Dispense: 180 tablet; Refill: 1  3. Other seasonal allergic rhinitis Continue OTC allergy medication as needed. Call office if need further intervention.  Benadryl has worked for her.  Might try zyrtec instead to reduce anticholinergic burden somewhat.  4.  Need for pneumococcal 13:  prevnar given today  Next appt:  Keep May appt  [place] Urieta L. Margaretha Mahan, D.O. [facility] [address], [place] [address] Cell Phone (Mon-Fri 8am-5pm):  [phone] On Call:  [phone] & follow prompts after 5pm & weekends Office Phone:  [phone] Office Fax:  [phone]

## 2014-09-14 ENCOUNTER — Encounter: Payer: Self-pay | Admitting: Internal Medicine

## 2014-10-02 ENCOUNTER — Encounter: Payer: Medicare Other | Admitting: Internal Medicine

## 2014-10-29 ENCOUNTER — Other Ambulatory Visit: Payer: Self-pay

## 2014-10-29 DIAGNOSIS — I10 Essential (primary) hypertension: Secondary | ICD-10-CM

## 2014-10-29 MED ORDER — TRIAMTERENE-HCTZ 37.5-25 MG PO TABS: ORAL_TABLET | ORAL | Status: DC

## 2014-12-04 ENCOUNTER — Other Ambulatory Visit: Payer: Self-pay | Admitting: *Deleted

## 2014-12-04 DIAGNOSIS — E038 Other specified hypothyroidism: Secondary | ICD-10-CM

## 2014-12-04 MED ORDER — LEVOTHYROXINE SODIUM 137 MCG PO TABS: ORAL_TABLET | ORAL | Status: DC

## 2014-12-04 NOTE — Telephone Encounter (Signed)
[facility] 

## 2015-01-13 ENCOUNTER — Other Ambulatory Visit: Payer: Self-pay

## 2015-01-13 MED ORDER — LISINOPRIL 40 MG PO TABS: ORAL_TABLET | ORAL | Status: DC

## 2015-01-20 DIAGNOSIS — D225 Melanocytic nevi of trunk: Secondary | ICD-10-CM | POA: Diagnosis not present

## 2015-01-20 DIAGNOSIS — L821 Other seborrheic keratosis: Secondary | ICD-10-CM | POA: Diagnosis not present

## 2015-01-20 DIAGNOSIS — Z85828 Personal history of other malignant neoplasm of skin: Secondary | ICD-10-CM | POA: Diagnosis not present

## 2015-01-27 ENCOUNTER — Other Ambulatory Visit: Payer: Self-pay

## 2015-01-27 DIAGNOSIS — F329 Major depressive disorder, single episode, unspecified: Secondary | ICD-10-CM

## 2015-01-27 DIAGNOSIS — F32A Depression, unspecified: Secondary | ICD-10-CM

## 2015-01-27 MED ORDER — SERTRALINE HCL 25 MG PO TABS: ORAL_TABLET | ORAL | Status: DC

## 2015-02-03 ENCOUNTER — Other Ambulatory Visit: Payer: Self-pay | Admitting: *Deleted

## 2015-02-03 DIAGNOSIS — E038 Other specified hypothyroidism: Secondary | ICD-10-CM

## 2015-02-03 MED ORDER — LEVOTHYROXINE SODIUM 137 MCG PO TABS: ORAL_TABLET | ORAL | Status: DC

## 2015-02-03 NOTE — Telephone Encounter (Signed)
[facility] 

## 2015-02-12 ENCOUNTER — Other Ambulatory Visit: Payer: Self-pay

## 2015-02-12 DIAGNOSIS — Z9012 Acquired absence of left breast and nipple: Secondary | ICD-10-CM

## 2015-02-12 DIAGNOSIS — Z853 Personal history of malignant neoplasm of breast: Secondary | ICD-10-CM

## 2015-02-19 ENCOUNTER — Ambulatory Visit (INDEPENDENT_AMBULATORY_CARE_PROVIDER_SITE_OTHER): Payer: Commercial Managed Care - HMO | Admitting: Internal Medicine

## 2015-02-19 ENCOUNTER — Encounter: Payer: Self-pay | Admitting: Internal Medicine

## 2015-02-19 VITALS — BP 130/84 | HR 64 | Temp 98.7°F | Ht 63.5 in | Wt 169.0 lb

## 2015-02-19 DIAGNOSIS — F329 Major depressive disorder, single episode, unspecified: Secondary | ICD-10-CM

## 2015-02-19 DIAGNOSIS — Z0000 Encounter for general adult medical examination without abnormal findings: Secondary | ICD-10-CM | POA: Diagnosis not present

## 2015-02-19 DIAGNOSIS — F32A Depression, unspecified: Secondary | ICD-10-CM

## 2015-02-19 DIAGNOSIS — I10 Essential (primary) hypertension: Secondary | ICD-10-CM | POA: Diagnosis not present

## 2015-02-19 DIAGNOSIS — E890 Postprocedural hypothyroidism: Secondary | ICD-10-CM | POA: Diagnosis not present

## 2015-02-19 DIAGNOSIS — Z13220 Encounter for screening for lipoid disorders: Secondary | ICD-10-CM

## 2015-02-19 DIAGNOSIS — D509 Iron deficiency anemia, unspecified: Secondary | ICD-10-CM | POA: Diagnosis not present

## 2015-02-19 DIAGNOSIS — R739 Hyperglycemia, unspecified: Secondary | ICD-10-CM | POA: Diagnosis not present

## 2015-02-19 DIAGNOSIS — Z23 Encounter for immunization: Secondary | ICD-10-CM

## 2015-02-19 DIAGNOSIS — M810 Age-related osteoporosis without current pathological fracture: Secondary | ICD-10-CM | POA: Diagnosis not present

## 2015-02-19 NOTE — Progress Notes (Signed)
Patient ID: Deborah Randall, female   DOB: 01/27/1941, 74 y.o.   MRN: [id number]   Location:  [facility] / [facility]  Code Status: DNR Goals of Care: Advanced Directive information Does patient have an advance directive?: Yes, Type of Advance Directive: Living will;Healthcare Power of Attorney   Chief Complaint  Patient presents with  . Annual Exam    Comprehensive exam  . Medical Management of Chronic Issues    blood pressure, depression, thyoid, cholesterol, hyperglycemia    HPI: Patient is a 74 y.o. white female seen in the office today for her annual wellness exam and med mgt of chronic diseases.  Fall Risk  02/19/2015 08/03/2014 07/02/2014 04/02/2014 03/26/2014  Falls in the past year? No No No No No   Depression screen [facility] 2/9 02/19/2015 07/02/2014 04/02/2014 03/26/2014 09/05/2012  Decreased Interest 0 0 0 0 0  Down, Depressed, Hopeless 0 0 0 0 0  PHQ - 2 Score 0 0 0 0 0    MMSE - Mini Mental State Exam 02/19/2015 09/25/2013  Orientation to time 5 5  Orientation to Place 5 5  Registration 3 3  Attention/ Calculation 5 5  Recall 3 2  Language- name 2 objects 2 2  Language- repeat 1 1  Language- follow 3 step command 3 3  Language- read & follow direction 1 1  Write a sentence 1 1  Copy design 1 1  Total score 30 29  Passed clock Immunization History  Administered Date(s) Administered  . Influenza,inj,Quad PF,36+ Mos 03/14/2013, 03/26/2014  . Pneumococcal Conjugate-13 08/02/2013, 08/03/2014  . Pneumococcal Polysaccharide-23 05/22/2009  . Zoster 09/25/2013    Vision testing performed (see in encounter). Hearing normal. Function:  Independent in all ADLs.  Works at the [employer]. Pain:  Denies any. Physical activity:  Works in yard, at [employer]. Urinary incontinence: Has had 1-2 episodes where she didn't quite make it to the restroom and leaked when she held it too long at night.  Med mgt: Senile osteoporosis:  Taking fosamax for  several years.  Needs recheck of bone density test.  Already has mammogram scheduled so will try to arrange them together.  H/o breast cancer with left mastectomy.  Depression:  Says spirits are better.  Stressors remain the same with her son.  Spends his time in bed.  Has bronchiectasis.  He is chronically ill and can't go out in humidity.  HTN:  Blood pressure good today with lisinopril.    Hypothyroidism:  Continues on synthroid.  Review of Systems:  Review of Systems  Constitutional: Negative for fever, chills and malaise/fatigue.  HENT: Negative for congestion and hearing loss.   Eyes: Negative for blurred vision.  Respiratory: Negative for shortness of breath.   Cardiovascular: Negative for chest pain and leg swelling.  Gastrointestinal: Negative for heartburn, nausea, vomiting, abdominal pain, constipation, blood in stool and melena.  Genitourinary: Positive for urgency. Negative for dysuria and frequency.  Musculoskeletal: Negative for myalgias and falls.  Neurological: Negative for dizziness, weakness and headaches.  Endo/Heme/Allergies: Does not bruise/bleed easily.  Psychiatric/Behavioral: Negative for depression and memory loss. The patient is not nervous/anxious and does not have insomnia.     Past Medical History  Diagnosis Date  . Thyroid disease   . Depression   . Unspecified cataract   . Hypertension   . Lumbago   . Senile osteoporosis   . Headache(784.0)   . Nausea alone     Past Surgical History  Procedure  Laterality Date  . Thyroid surgery    . Abdominal hysterectomy    . Hernia repair    . Breast surgery      RT & LT w/ Dr Ralene Ok   Social History   Social History  . Marital Status: Widowed    Spouse Name: N/A  . Number of Children: N/A  . Years of Education: N/A   Occupational History  . 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 Smoking status: Never Smoker   . Smokeless tobacco: Never Used  . Alcohol Use: No  .  Drug Use: No  . Sexual Activity: Not on file   Other Topics Concern  . Not on file   Social History Narrative   Lives alone   Widow   2 sons , one son decease, one son disable    Works at [employer] in [employer]   Enjoys going places with friends   Never smoked   Alcohol none   Exercise -walk, works in yard    Family History  Problem Relation Age of Onset  . Cancer Father   . Heart attack Brother   . Mental illness Son   . Other Brother   . Other Brother   . Cancer Sister   . Heart disease Sister   . Hypertension Sister   . Other Brother   . Diabetes Brother   . COPD Brother   . Mental illness Son     No Known Allergies Medications: Patient's Medications  New Prescriptions   No medications on file  Previous Medications   ALENDRONATE (FOSAMAX) 70 MG TABLET    Take one tablet by mouth weekly with 8 ounces water, stay upright for 30 minutes after taking, For Osteoporosis   ALPRAZOLAM (XANAX) 0.5 MG TABLET    Take 1 tablet (0.5 mg total) by mouth 3 (three) times daily as needed. For anxiety   CA PHOSPHATE-CHOLECALCIFEROL (CALTRATE GUMMY BITES) 250-400 MG-UNIT CHEW    Chew 2 each by mouth 2 (two) times daily.   CHOLECALCIFEROL (VITAMIN D3) 2000 UNITS TABS    Take by mouth daily.   DIPHENHYDRAMINE (SOMINEX) 25 MG TABLET    Take 25 mg by mouth at bedtime as needed for sleep. Take 1 tablets once daily   LEVOTHYROXINE (SYNTHROID, LEVOTHROID) 137 MCG TABLET    Take one tablet by mouth 30 minutes before breakfast for thyroid   LISINOPRIL (PRINIVIL,ZESTRIL) 40 MG TABLET    Take 1/2 tablet by mouth once daily to control blood pressure.   RANITIDINE (ZANTAC) 75 MG TABLET    Take 75 mg by mouth 2 (two) times daily as needed (For Heartburn).    SERTRALINE (ZOLOFT) 25 MG TABLET    2 tablets by mouth daily for depression   TDAP (BOOSTRIX) 5-2.5-18.5 LF-MCG/0.5 INJECTION    Inject 0.5 mLs into the muscle once.   TRIAMTERENE-HYDROCHLOROTHIAZIDE (MAXZIDE-25) 37.5-25 MG PER TABLET     Take 1/2 tablet alternating with 1 tablet daily for blood pressure and edema   ZOSTER VACCINE LIVE, PF, (ZOSTAVAX) 62952 UNT/0.65ML INJECTION    Inject 19,400 Units into the skin once.  Modified Medications   No medications on file  Discontinued Medications   No medications on file    Physical Exam: Filed Vitals:   02/19/15 0854  BP: 130/84  Pulse: 64  Temp: 98.7 F (37.1 C)  TempSrc: Oral  Height: 5' 3.5" (1.613 m)  Weight: 169 lb (76.658 kg)  SpO2: 96%  Physical Exam  Constitutional: She is oriented to person, place, and time. She appears well-developed and well-nourished. No distress.  HENT:  Head: Normocephalic and atraumatic.  Cardiovascular: Normal rate, regular rhythm, normal heart sounds and intact distal pulses.   Pulmonary/Chest: Effort normal and breath sounds normal. No respiratory distress.  Abdominal: Soft. Bowel sounds are normal. She exhibits no distension. There is no tenderness.  Musculoskeletal: Normal range of motion. She exhibits no edema or tenderness.  Neurological: She is alert and oriented to person, place, and time. She has normal reflexes.  Skin: Skin is warm and dry.  Psychiatric: She has a normal mood and affect. Her behavior is normal. Judgment and thought content normal.    Labs reviewed: Basic Metabolic Panel:  Recent Labs  03/26/14 0859 07/02/14 1419  NA 141  --   K 5.6*  --   CL 98  --   CO2 28  --   GLUCOSE 104*  --   BUN 14  --   CREATININE 1.20*  --   CALCIUM 10.1  --   TSH 10.000* 1.390   Liver Function Tests:  Recent Labs  03/26/14 0859  AST 24  ALT 16  ALKPHOS 73  BILITOT 0.5  PROT 6.7   No results for input(s): LIPASE, AMYLASE in the last 8760 hours. No results for input(s): AMMONIA in the last 8760 hours. CBC:  Recent Labs  03/26/14 0859  WBC 7.6  NEUTROABS 4.1  HGB 14.6  HCT 41.5  MCV 85  PLT 206    Lab Results  Component Value Date   HGBA1C 5.9* 03/26/2014   Assessment/Plan 1. Routine  general medical examination at a health care facility -see annual exam details in hpi -add bone density to mammogram -given flu shot today  2. Depression -stable, better with zoloft, but still struggles with her son's illness, also has prn xanax, but does not use much--last ordered in March  3. Postoperative hypothyroidism - cont current synthroid, need to recheck tsh  - TSH  4. Lipid screening - not on meds for cholesterol--need to assess - Lipid panel  5. Essential hypertension, benign -bp at goal with triamterene/hctz, lisinopril, so continue these - Basic metabolic panel  6. Anemia, iron deficiency -f/u cbc next time; no clinical signs of anemia, not taking an iron supplement  7. Senile osteoporosis -continues ca with D and additional Vitamin D 2000 units daily -add bone density to mammogram next month -is on fosamax at present for several years so would change to prolia if density declined - DG Bone Density; Future  8. Hyperglycemia - f/u labs: - Hemoglobin A1c  9. Need for prophylactic vaccination and inoculation against influenza - Flu Vaccine QUAD 36+ mos PF IM (Fluarix & Fluzone Quad PF) was given   Labs/tests ordered: Orders Placed This Encounter  Procedures  . DG Bone Density    Standing Status: Future     Number of Occurrences:      Standing Expiration Date: 04/20/2016    Scheduling Instructions:     Please try to schedule with the mammogram she already has in October    Order Specific Question:  Reason for Exam (SYMPTOM  OR DIAGNOSIS REQUIRED)    Answer:  senile osteoporosis, on fosamax x several years, calcium and vitamin D    Order Specific Question:  Preferred imaging location?    Answer:  [facility]  . Flu Vaccine QUAD 36+ mos PF IM (Fluarix & Fluzone Quad PF)  . Lipid panel  Order Specific Question:  Has the patient fasted?    Answer:  Yes  . TSH  . Basic metabolic panel    Order Specific Question:  Has the patient fasted?    Answer:   Yes  . Hemoglobin A1c    Next appt:  6 mos for med mgt and possibly labs day of visit  [place]. Crystall Donaldson, D.O. [facility] [address], [place] [address] Cell Phone (Mon-Fri 8am-5pm):  [phone] On Call:  [phone] & follow prompts after 5pm & weekends Office Phone:  [phone] Office Fax:  [phone]

## 2015-02-19 NOTE — Progress Notes (Signed)
Passed clock drawing 

## 2015-02-20 LAB — BASIC METABOLIC PANEL
BUN/Creatinine Ratio: 11 (ref 11–26)
BUN: 12 mg/dL (ref 8–27)
CO2: 24 mmol/L (ref 18–29)
Calcium: 9.4 mg/dL (ref 8.7–10.3)
Chloride: 95 mmol/L — ABNORMAL LOW (ref 97–108)
Creatinine, Ser: 1.08 mg/dL — ABNORMAL HIGH (ref 0.57–1.00)
GFR calc Af Amer: 58 mL/min/{1.73_m2} — ABNORMAL LOW (ref >59)
GFR calc non Af Amer: 50 mL/min/{1.73_m2} — ABNORMAL LOW (ref >59)
Glucose: 86 mg/dL (ref 65–99)
Potassium: 4.2 mmol/L (ref 3.5–5.2)
Sodium: 136 mmol/L (ref 134–144)

## 2015-02-20 LAB — HEMOGLOBIN A1C
Est. average glucose Bld gHb Est-mCnc: 123 mg/dL
Hgb A1c MFr Bld: 5.9 % — ABNORMAL HIGH (ref 4.8–5.6)

## 2015-02-20 LAB — LIPID PANEL
Chol/HDL Ratio: 4.9 ratio units — ABNORMAL HIGH (ref 0.0–4.4)
Cholesterol, Total: 259 mg/dL — ABNORMAL HIGH (ref 100–199)
HDL: 53 mg/dL (ref >39)
LDL Calculated: 166 mg/dL — ABNORMAL HIGH (ref 0–99)
Triglycerides: 199 mg/dL — ABNORMAL HIGH (ref 0–149)
VLDL Cholesterol Cal: 40 mg/dL (ref 5–40)

## 2015-02-20 LAB — TSH: TSH: 3.33 u[IU]/mL (ref 0.450–4.500)

## 2015-02-23 ENCOUNTER — Other Ambulatory Visit: Payer: Self-pay

## 2015-02-23 MED ORDER — PRAVASTATIN SODIUM 40 MG PO TABS: ORAL_TABLET | ORAL | Status: DC

## 2015-03-12 ENCOUNTER — Ambulatory Visit
Admission: RE | Admit: 2015-03-12 | Discharge: 2015-03-12 | Disposition: A | Discharge status: Home / Self Care | Payer: Commercial Managed Care - HMO | Source: Ambulatory Visit

## 2015-03-12 DIAGNOSIS — Z1231 Encounter for screening mammogram for malignant neoplasm of breast: Secondary | ICD-10-CM | POA: Diagnosis not present

## 2015-03-12 DIAGNOSIS — Z9012 Acquired absence of left breast and nipple: Secondary | ICD-10-CM

## 2015-03-12 DIAGNOSIS — Z853 Personal history of malignant neoplasm of breast: Secondary | ICD-10-CM

## 2015-04-02 ENCOUNTER — Other Ambulatory Visit: Payer: Self-pay

## 2015-04-06 ENCOUNTER — Other Ambulatory Visit: Payer: Self-pay | Admitting: *Deleted

## 2015-04-06 DIAGNOSIS — E038 Other specified hypothyroidism: Secondary | ICD-10-CM

## 2015-04-06 MED ORDER — LEVOTHYROXINE SODIUM 137 MCG PO TABS: ORAL_TABLET | ORAL | Status: DC

## 2015-04-06 NOTE — Telephone Encounter (Signed)
[facility] 

## 2015-04-14 ENCOUNTER — Other Ambulatory Visit: Payer: Self-pay

## 2015-04-14 MED ORDER — LISINOPRIL 40 MG PO TABS: ORAL_TABLET | ORAL | Status: DC

## 2015-06-02 ENCOUNTER — Other Ambulatory Visit: Payer: Self-pay | Admitting: *Deleted

## 2015-06-02 DIAGNOSIS — E038 Other specified hypothyroidism: Secondary | ICD-10-CM

## 2015-06-02 MED ORDER — LEVOTHYROXINE SODIUM 137 MCG PO TABS: ORAL_TABLET | ORAL | Status: DC

## 2015-06-02 MED ORDER — ALPRAZOLAM 0.5 MG PO TABS: 0.5000 mg | ORAL_TABLET | Freq: Three times a day (TID) | ORAL | Status: DC | PRN

## 2015-06-02 NOTE — Telephone Encounter (Signed)
[facility] 

## 2015-06-28 ENCOUNTER — Other Ambulatory Visit: Payer: Self-pay | Admitting: *Deleted

## 2015-06-28 DIAGNOSIS — F32A Depression, unspecified: Secondary | ICD-10-CM

## 2015-06-28 DIAGNOSIS — F329 Major depressive disorder, single episode, unspecified: Secondary | ICD-10-CM

## 2015-06-28 MED ORDER — SERTRALINE HCL 25 MG PO TABS: ORAL_TABLET | ORAL | Status: DC

## 2015-06-28 NOTE — Telephone Encounter (Signed)
[facility] 

## 2015-07-07 ENCOUNTER — Inpatient Hospital Stay (HOSPITAL_COMMUNITY)
Admission: EM | Admit: 2015-07-07 | Discharge: 2015-07-08 | DRG: 310 | Disposition: A | Discharge status: Home / Self Care | Payer: Commercial Managed Care - HMO | Attending: Cardiovascular Disease | Admitting: Cardiovascular Disease

## 2015-07-07 ENCOUNTER — Emergency Department (HOSPITAL_COMMUNITY): Payer: Commercial Managed Care - HMO

## 2015-07-07 ENCOUNTER — Encounter (HOSPITAL_COMMUNITY): Payer: Self-pay | Admitting: Family Medicine

## 2015-07-07 DIAGNOSIS — I480 Paroxysmal atrial fibrillation: Principal | ICD-10-CM | POA: Diagnosis present

## 2015-07-07 DIAGNOSIS — I209 Angina pectoris, unspecified: Secondary | ICD-10-CM | POA: Diagnosis not present

## 2015-07-07 DIAGNOSIS — R079 Chest pain, unspecified: Secondary | ICD-10-CM | POA: Diagnosis not present

## 2015-07-07 DIAGNOSIS — Z923 Personal history of irradiation: Secondary | ICD-10-CM | POA: Diagnosis not present

## 2015-07-07 DIAGNOSIS — I10 Essential (primary) hypertension: Secondary | ICD-10-CM | POA: Diagnosis not present

## 2015-07-07 DIAGNOSIS — I4891 Unspecified atrial fibrillation: Secondary | ICD-10-CM

## 2015-07-07 DIAGNOSIS — F32A Depression, unspecified: Secondary | ICD-10-CM

## 2015-07-07 DIAGNOSIS — M810 Age-related osteoporosis without current pathological fracture: Secondary | ICD-10-CM | POA: Diagnosis not present

## 2015-07-07 DIAGNOSIS — F329 Major depressive disorder, single episode, unspecified: Secondary | ICD-10-CM | POA: Diagnosis not present

## 2015-07-07 DIAGNOSIS — Z9012 Acquired absence of left breast and nipple: Secondary | ICD-10-CM | POA: Diagnosis not present

## 2015-07-07 DIAGNOSIS — E7800 Pure hypercholesterolemia, unspecified: Secondary | ICD-10-CM | POA: Diagnosis present

## 2015-07-07 DIAGNOSIS — E039 Hypothyroidism, unspecified: Secondary | ICD-10-CM | POA: Diagnosis not present

## 2015-07-07 DIAGNOSIS — E785 Hyperlipidemia, unspecified: Secondary | ICD-10-CM | POA: Diagnosis present

## 2015-07-07 DIAGNOSIS — Z853 Personal history of malignant neoplasm of breast: Secondary | ICD-10-CM | POA: Diagnosis not present

## 2015-07-07 HISTORY — DX: Anxiety disorder, unspecified: F41.9

## 2015-07-07 HISTORY — DX: Iron deficiency anemia, unspecified: D50.9

## 2015-07-07 HISTORY — DX: Hypothyroidism, unspecified: E03.9

## 2015-07-07 HISTORY — DX: Malignant neoplasm of unspecified site of right female breast: C50.911

## 2015-07-07 HISTORY — DX: Headache, unspecified: R51.9

## 2015-07-07 HISTORY — DX: Unspecified atrial fibrillation: I48.91

## 2015-07-07 HISTORY — DX: Hyperlipidemia, unspecified: E78.5

## 2015-07-07 HISTORY — DX: Squamous cell carcinoma of skin of other parts of face: C44.329

## 2015-07-07 HISTORY — DX: Malignant neoplasm of unspecified site of left female breast: C50.912

## 2015-07-07 HISTORY — DX: Headache: R51

## 2015-07-07 HISTORY — DX: Gastro-esophageal reflux disease without esophagitis: K21.9

## 2015-07-07 LAB — BASIC METABOLIC PANEL
ANION GAP: 11 (ref 5–15)
BUN: 14 mg/dL (ref 6–20)
CHLORIDE: 103 mmol/L (ref 101–111)
CO2: 23 mmol/L (ref 22–32)
Calcium: 9 mg/dL (ref 8.9–10.3)
Creatinine, Ser: 1.19 mg/dL — ABNORMAL HIGH (ref 0.44–1.00)
GFR calc non Af Amer: 44 mL/min — ABNORMAL LOW (ref >60)
GFR, EST AFRICAN AMERICAN: 50 mL/min — AB (ref >60)
Glucose, Bld: 131 mg/dL — ABNORMAL HIGH (ref 65–99)
Potassium: 3.8 mmol/L (ref 3.5–5.1)
Sodium: 137 mmol/L (ref 135–145)

## 2015-07-07 LAB — T4, FREE: Free T4: 1.06 ng/dL (ref 0.61–1.12)

## 2015-07-07 LAB — CBC
HEMATOCRIT: 44.8 % (ref 36.0–46.0)
HEMOGLOBIN: 14.2 g/dL (ref 12.0–15.0)
MCH: 28.1 pg (ref 26.0–34.0)
MCHC: 31.7 g/dL (ref 30.0–36.0)
MCV: 88.7 fL (ref 78.0–100.0)
Platelets: 159 10*3/uL (ref 150–400)
RBC: 5.05 MIL/uL (ref 3.87–5.11)
RDW: 13.8 % (ref 11.5–15.5)
WBC: 11.2 10*3/uL — ABNORMAL HIGH (ref 4.0–10.5)

## 2015-07-07 LAB — I-STAT TROPONIN, ED: TROPONIN I, POC: 0 ng/mL (ref 0.00–0.08)

## 2015-07-07 LAB — TROPONIN I

## 2015-07-07 MED ORDER — METOPROLOL TARTRATE 50 MG PO TABS
50.0000 mg | ORAL_TABLET | Freq: Two times a day (BID) | ORAL | Status: DC
  Administered 2015-07-07: 50 mg via ORAL
  Filled 2015-07-07 (×2): qty 1

## 2015-07-07 MED ORDER — ZOLPIDEM TARTRATE 5 MG PO TABS: 5.0000 mg | ORAL_TABLET | Freq: Every evening | ORAL | Status: DC | PRN

## 2015-07-07 MED ORDER — DILTIAZEM LOAD VIA INFUSION
20.0000 mg | Freq: Once | INTRAVENOUS | Status: AC
  Administered 2015-07-07: 20 mg via INTRAVENOUS

## 2015-07-07 MED ORDER — ALPRAZOLAM 0.5 MG PO TABS: 0.5000 mg | ORAL_TABLET | Freq: Three times a day (TID) | ORAL | Status: DC | PRN

## 2015-07-07 MED ORDER — ACETAMINOPHEN 325 MG PO TABS
650.0000 mg | ORAL_TABLET | ORAL | Status: DC | PRN
  Administered 2015-07-07: 650 mg via ORAL
  Filled 2015-07-07: qty 2

## 2015-07-07 MED ORDER — DILTIAZEM HCL 100 MG IV SOLR
5.0000 mg/h | INTRAVENOUS | Status: DC
  Administered 2015-07-07: 5 mg/h via INTRAVENOUS
  Administered 2015-07-08: 10 mg/h via INTRAVENOUS
  Filled 2015-07-07 (×3): qty 100

## 2015-07-07 MED ORDER — ONDANSETRON HCL 4 MG/2ML IJ SOLN
4.0000 mg | Freq: Once | INTRAMUSCULAR | Status: AC
  Administered 2015-07-07: 4 mg via INTRAVENOUS
  Filled 2015-07-07: qty 2

## 2015-07-07 MED ORDER — SERTRALINE HCL 50 MG PO TABS
50.0000 mg | ORAL_TABLET | Freq: Every day | ORAL | Status: DC
  Administered 2015-07-07 – 2015-07-08 (×2): 50 mg via ORAL
  Filled 2015-07-07 (×2): qty 1

## 2015-07-07 MED ORDER — ONDANSETRON HCL 4 MG/2ML IJ SOLN
4.0000 mg | Freq: Four times a day (QID) | INTRAMUSCULAR | Status: DC | PRN
  Administered 2015-07-07 – 2015-07-08 (×2): 4 mg via INTRAVENOUS
  Filled 2015-07-07 (×2): qty 2

## 2015-07-07 MED ORDER — ASPIRIN 81 MG PO CHEW
324.0000 mg | CHEWABLE_TABLET | Freq: Once | ORAL | Status: AC
  Administered 2015-07-07: 324 mg via ORAL
  Filled 2015-07-07: qty 4

## 2015-07-07 MED ORDER — FAMOTIDINE 20 MG PO TABS
20.0000 mg | ORAL_TABLET | Freq: Every day | ORAL | Status: DC
  Administered 2015-07-07 – 2015-07-08 (×2): 20 mg via ORAL
  Filled 2015-07-07 (×2): qty 1

## 2015-07-07 MED ORDER — LEVOTHYROXINE SODIUM 25 MCG PO TABS
137.0000 ug | ORAL_TABLET | Freq: Every day | ORAL | Status: DC
  Administered 2015-07-08: 137 ug via ORAL
  Filled 2015-07-07: qty 1

## 2015-07-07 MED ORDER — PRAVASTATIN SODIUM 40 MG PO TABS
40.0000 mg | ORAL_TABLET | Freq: Every day | ORAL | Status: DC
  Administered 2015-07-07: 40 mg via ORAL
  Filled 2015-07-07: qty 1

## 2015-07-07 MED ORDER — TRIAMTERENE-HCTZ 37.5-25 MG PO TABS
0.5000 | ORAL_TABLET | Freq: Every day | ORAL | Status: DC
  Administered 2015-07-07: 0.5 via ORAL
  Filled 2015-07-07 (×3): qty 0.5

## 2015-07-07 MED ORDER — ANTI-NAUSEA 1.87-1.87-21.5 PO SOLN: Freq: Three times a day (TID) | ORAL | Status: DC | PRN

## 2015-07-07 MED ORDER — APIXABAN 5 MG PO TABS
5.0000 mg | ORAL_TABLET | Freq: Two times a day (BID) | ORAL | Status: DC
  Administered 2015-07-07 – 2015-07-08 (×2): 5 mg via ORAL
  Filled 2015-07-07 (×2): qty 1

## 2015-07-07 NOTE — ED Notes (Signed)
Brought patient back to room via wheelchair; patient already in gown; placed on monitor, continuous pulse oximetry and blood pressure cuff; patient given warm blanket

## 2015-07-07 NOTE — ED Provider Notes (Signed)
CSN: [id number]     Arrival date & time 07/07/15  0804 History   First MD Initiated Contact with Patient 07/07/15 [phone]     Chief Complaint  Patient presents with  . Tachycardia  . Chest Pain     (Consider location/radiation/quality/duration/timing/severity/associated sxs/prior Treatment) HPI Comments: Patient with a HTN and Hypothyroidism presents today with a chief complaint of chest pain, which has been constant since 3 AM this morning.  Pain is substernal, non radiating pain.  She has not noticed an exertional component to the pain.  She states that she has never had pain like this before.  She denies any SOB, palpitations, dizziness, syncope, LE edema, nausea, vomiting, numbness, or tingling.  She states that she has never had pain like this before.  Upon arrival in the ED she was found to be in Atrial Fibrillation with RVR and a HR of 153.  She denies any prior history of Atrial Fibrillation.  However, review of the chart shows A fib on an EKG from 2011 at the time of a hip fracture.  She is not on any anticoagulant.  Patient is a 75 y.o. female presenting with chest pain. The history is provided by the patient.  Chest Pain   Past Medical History  Diagnosis Date  . Thyroid disease   . Depression   . Unspecified cataract   . Hypertension   . Lumbago   . Senile osteoporosis   . Headache(784.0)   . Nausea alone    Past Surgical History  Procedure Laterality Date  . Thyroid surgery    . Abdominal hysterectomy    . Hernia repair    . Breast surgery      RT & LT w/ Dr Ralene Ok   Family History  Problem Relation Age of Onset  . Cancer Father   . Heart attack Brother   . Mental illness Son   . Other Brother   . Other Brother   . Cancer Sister   . Heart disease Sister   . Hypertension Sister   . Other Brother   . Diabetes Brother   . COPD Brother   . Mental illness Son    Social History  Substance Use Topics  . Smoking status: Never Smoker   . Smokeless tobacco:  Never Used  . Alcohol Use: No   OB History    No data available     Review of Systems  Cardiovascular: Positive for chest pain.  All other systems reviewed and are negative.     Allergies  Review of patient's allergies indicates no known allergies.  Home Medications   Prior to Admission medications   Medication Sig Start Date End Date Taking? Authorizing Provider  alendronate (FOSAMAX) 70 MG tablet Take one tablet by mouth weekly with 8 ounces water, stay upright for 30 minutes after taking, For Osteoporosis 07/02/14   Tiffany L Reed, DO  ALPRAZolam Duanne Moron) 0.5 MG tablet Take 1 tablet (0.5 mg total) by mouth 3 (three) times daily as needed. For anxiety 06/02/15   Gildardo Cranker, DO  Ca Phosphate-Cholecalciferol (CALTRATE GUMMY BITES) 250-400 MG-UNIT CHEW Chew 2 each by mouth 2 (two) times daily.    Historical Provider, MD  Cholecalciferol (VITAMIN D3) 2000 UNITS TABS Take by mouth daily.    Historical Provider, MD  diphenhydrAMINE (SOMINEX) 25 MG tablet Take 25 mg by mouth at bedtime as needed for sleep. Take 1 tablets once daily    Historical Provider, MD  levothyroxine (SYNTHROID, LEVOTHROID) 137 MCG tablet Take  one tablet by mouth 30 minutes before breakfast for thyroid 06/02/15   Tiffany L Reed, DO  lisinopril (PRINIVIL,ZESTRIL) 40 MG tablet Take 1/2 tablet by mouth once daily to control blood pressure. 04/14/15   Tiffany L Reed, DO  pravastatin (PRAVACHOL) 40 MG tablet Take 1 tablet by mouth every evening with meal 02/23/15   Tiffany L Reed, DO  ranitidine (ZANTAC) 75 MG tablet Take 75 mg by mouth 2 (two) times daily as needed (For Heartburn).     Historical Provider, MD  sertraline (ZOLOFT) 25 MG tablet Take 2 tablets by mouth daily for depression 06/28/15   Tiffany L Reed, DO  Tdap (BOOSTRIX) 5-2.5-18.5 LF-MCG/0.5 injection Inject 0.5 mLs into the muscle once. 09/25/13   Tiffany L Reed, DO  triamterene-hydrochlorothiazide (MAXZIDE-25) 37.5-25 MG per tablet Take 1/2 tablet alternating  with 1 tablet daily for blood pressure and edema 10/29/14   Tiffany L Reed, DO  zoster vaccine live, PF, (ZOSTAVAX) 91478 UNT/0.65ML injection Inject 19,400 Units into the skin once. 09/25/13   Tiffany L Reed, DO   BP 125/54 mmHg  Pulse 153  Temp(Src) 98.3 F (36.8 C) (Oral)  Resp 19  SpO2 97% Physical Exam  Constitutional: She appears well-developed and well-nourished. No distress.  HENT:  Head: Normocephalic and atraumatic.  Neck: Normal range of motion. Neck supple.  Cardiovascular: Normal heart sounds.  An irregularly irregular rhythm present. Tachycardia present.   Pulmonary/Chest: Effort normal and breath sounds normal.  Abdominal: Soft. Bowel sounds are normal. She exhibits no distension and no mass. There is no tenderness. There is no rebound and no guarding.  Musculoskeletal: Normal range of motion.  Neurological: She is alert.  Skin: Skin is warm and dry. She is not diaphoretic.  Psychiatric: She has a normal mood and affect.  Nursing note and vitals reviewed.   ED Course  Procedures (including critical care time) Labs Review Labs Reviewed  BASIC METABOLIC PANEL - Abnormal; Notable for the following:    Glucose, Bld 131 (*)    Creatinine, Ser 1.19 (*)    GFR calc non Af Amer 44 (*)    GFR calc Af Amer 50 (*)    All other components within normal limits  CBC - Abnormal; Notable for the following:    WBC 11.2 (*)    All other components within normal limits  I-STAT TROPOININ, ED    Imaging Review Dg Chest 2 View  07/07/2015  CLINICAL DATA:  Chest pain and indigestion beginning last night. Left-sided neck pain. EXAM: CHEST  2 VIEW COMPARISON:  03/19/2012 FINDINGS: Heart size is normal. There is a large hiatal hernia, larger than seen on the previous study. The lungs are clear. The vascularity is normal. No effusions. No acute bone finding. Surgical clips in the right axilla and left breast. IMPRESSION: Hiatal hernia, larger than seen in 2013. No active cardiopulmonary  disease. Electronically Signed   By: Nelson Chimes M.D.   On: 07/07/2015 08:47   I have personally reviewed and evaluated these images and lab results as part of my medical decision-making.   EKG Interpretation   Date/Time:  Wednesday July 07 2015 08:07:33 EST Ventricular Rate:  147 PR Interval:    QRS Duration: 70 QT Interval:  298 QTC Calculation: 466 R Axis:   36 Text Interpretation:  Atrial fibrillation with rapid ventricular response  Nonspecific T wave abnormality Abnormal ECG Confirmed by DELO  MD, DOUGLAS  ([id number]) on 07/07/2015 8:55:17 AM     CRITICAL CARE Performed by: Emelda Brothers,  Rudy Domek   Total critical care time: 30 minutes  Critical care time was exclusive of separately billable procedures and treating other patients.  Critical care was necessary to treat or prevent imminent or life-threatening deterioration.  Critical care was time spent personally by me on the following activities: development of treatment plan with patient and/or surrogate as well as nursing, discussions with consultants, evaluation of patient's response to treatment, examination of patient, obtaining history from patient or surrogate, ordering and performing treatments and interventions, ordering and review of laboratory studies, ordering and review of radiographic studies, pulse oximetry and re-evaluation of patient's condition.  MDM   Final diagnoses:  None   Patient presents today with a chief complaint of chest pain.  She denies a history of Atrial fibrillation, but found to be in A fib with RVR with a HR of 153 upon arrival in the ED.  Patient given IV Diltiazem with improvement in HR.  Blood pressure remained stable in the ED.  Initial troponin negative.  CXR is also negative.  Cardiology consulted and agreed to admit the patient.      Hyman Bible, PA-C 07/08/15 [place], PA-C 07/08/15 1654  Veryl Speak, MD 07/11/15 [phone]

## 2015-07-07 NOTE — ED Notes (Signed)
Pt here for left sided chest pain that has been constant since last night with radiation into left neck and arm.

## 2015-07-07 NOTE — Progress Notes (Signed)
Patient arrived to unit oriented, placed on cardiac monitor and vvs. Patient had left mastectomy and lump removal on right side with lymph nodes removed.  Per patient no restrictions. Pt resting with call bell within reach.  Will continue to monitor. Payton Emerald, RN

## 2015-07-07 NOTE — ED Notes (Signed)
EDP at bedside  

## 2015-07-07 NOTE — ED Notes (Signed)
MD titrated drip to 10 mg/hr.

## 2015-07-07 NOTE — H&P (Signed)
Reason for Admission: atrial fibrillation, paroxysmal  Primary Care Physician: Hollace Kinnier, DO  Cardiologist: New  HPI: This is a 75 y.o. female with a past medical history significant for HTN, hyperlipidemia, treated hypothyroidism, bilateral breat cancer (Rt mastectomy, L lumpectomy and XRT 15 years ago) presenting with abrupt onset chest pressure radiating to left neck starting at 3 AM. She was found to have atrial fibrillation with RVR (160 bpm) without major repolarization changes on ECG and normal initial troponin. She is now asymptomatic after IV diltiazem reduced HR to 110-120s. She has not had these complaints before and does not have exertional angina or dyspnea. Unaware of palpitations. No neurological complaints and no history of stroke/TIA or serious bleeding problems.  In 2011, she had brief atrial fibrillation with RVR postoperatively (left hip fracture surgery). No cardiac workup or followup was performed at that time.  PMHx:  Past Medical History  Diagnosis Date  . Thyroid disease   . Depression   . Unspecified cataract   . Hypertension   . Lumbago   . Senile osteoporosis   . Headache(784.0)   . Nausea alone    Past Surgical History  Procedure Laterality Date  . Thyroid surgery    . Abdominal hysterectomy    . Hernia repair    . Breast surgery      RT & LT w/ Dr Ralene Ok    FAMHx: Family History  Problem Relation Age of Onset  . Cancer Father   . Heart attack Brother   . Mental illness Son   . Other Brother   . Other Brother   . Cancer Sister   . Heart disease Sister   . Hypertension Sister   . Other Brother   . Diabetes Brother   . COPD Brother   . Mental illness Son     SOCHx:  reports that she has never smoked. She has never used smokeless tobacco. She reports that she does not drink alcohol or use illicit drugs.  ALLERGIES: No Known Allergies  ROS: Pertinent items noted in HPI and remainder of comprehensive ROS otherwise  negative.  HOME MEDICATIONS: No current facility-administered medications on file prior to encounter.   Current Outpatient Prescriptions on File Prior to Encounter  Medication Sig Dispense Refill  . alendronate (FOSAMAX) 70 MG tablet Take one tablet by mouth weekly with 8 ounces water, stay upright for 30 minutes after taking, For Osteoporosis 4 tablet 6  . ALPRAZolam (XANAX) 0.5 MG tablet Take 1 tablet (0.5 mg total) by mouth 3 (three) times daily as needed. For anxiety (Patient taking differently: Take 0.5 mg by mouth daily. For anxiety) 90 tablet 1  . Cholecalciferol (VITAMIN D3) 2000 UNITS TABS Take by mouth daily.    . diphenhydrAMINE (SOMINEX) 25 MG tablet Take 25 mg by mouth at bedtime as needed for sleep. Take 1 tablets once daily    . levothyroxine (SYNTHROID, LEVOTHROID) 137 MCG tablet Take one tablet by mouth 30 minutes before breakfast for thyroid 30 tablet 1  . lisinopril (PRINIVIL,ZESTRIL) 40 MG tablet Take 1/2 tablet by mouth once daily to control blood pressure. (Patient taking differently: Take 20 mg by mouth at bedtime. Take 1/2 tablet by mouth once daily to control blood pressure.) 45 tablet 3  . pravastatin (PRAVACHOL) 40 MG tablet Take 1 tablet by mouth every evening with meal 30 tablet 5  . ranitidine (ZANTAC) 75 MG tablet Take 75 mg by mouth 2 (two) times daily as needed (For Heartburn).     [name]  sertraline (ZOLOFT) 25 MG tablet Take 2 tablets by mouth daily for depression (Patient taking differently: Take 50 mg by mouth daily. Take 2 tablets by mouth daily for depression) 180 tablet 1  . triamterene-hydrochlorothiazide (MAXZIDE-25) 37.5-25 MG per tablet Take 1/2 tablet alternating with 1 tablet daily for blood pressure and edema (Patient taking differently: Take 0.5 tablets by mouth daily. ) 30 tablet 3  . Tdap (BOOSTRIX) 5-2.5-18.5 LF-MCG/0.5 injection Inject 0.5 mLs into the muscle once. (Patient not taking: Reported on 07/07/2015) 0.5 mL 0  . zoster vaccine live, PF,  (ZOSTAVAX) 09811 UNT/0.65ML injection Inject 19,400 Units into the skin once. (Patient not taking: Reported on 07/07/2015) 1 each Thayer: I have reviewed the patient's current medications.  VITALS: Blood pressure 107/68, pulse 97, temperature 98.3 F (36.8 C), temperature source Oral, resp. rate 12, SpO2 95 %.  PHYSICAL EXAM:  General: Alert, oriented x3, no distress Head: no evidence of trauma, PERRL, EOMI, no exophtalmos or lid lag, no myxedema, no xanthelasma; normal ears, nose and oropharynx Neck: normal jugular venous pulsations and no hepatojugular reflux; brisk carotid pulses without delay and no carotid bruits Chest: clear to auscultation, no signs of consolidation by percussion or palpation, normal fremitus, symmetrical and full respiratory excursions Cardiovascular: normal position and quality of the apical impulse, irregular rhythm, normal first heart sound and normal second heart sound, no rubs or gallops, no murmur Abdomen: no tenderness or distention, no masses by palpation, no abnormal pulsatility or arterial bruits, normal bowel sounds, no hepatosplenomegaly Extremities: no clubbing, cyanosis;  no edema; 2+ radial, ulnar and brachial pulses bilaterally; 2+ right femoral, posterior tibial and dorsalis pedis pulses; 2+ left femoral, posterior tibial and dorsalis pedis pulses; no subclavian or femoral bruits Neurological: grossly nonfocal   LABS  CBC  Recent Labs  07/07/15 0811  WBC 11.2*  HGB 14.2  HCT 44.8  MCV 88.7  PLT Q000111Q   Basic Metabolic Panel  Recent Labs  07/07/15 0811  NA 137  K 3.8  CL 103  CO2 23  GLUCOSE 131*  BUN 14  CREATININE 1.19*  CALCIUM 9.0      IMAGING: Dg Chest 2 View  07/07/2015  CLINICAL DATA:  Chest pain and indigestion beginning last night. Left-sided neck pain. EXAM: CHEST  2 VIEW COMPARISON:  03/19/2012 FINDINGS: Heart size is normal. There is a large hiatal hernia, larger than seen on the previous study.  The lungs are clear. The vascularity is normal. No effusions. No acute bone finding. Surgical clips in the right axilla and left breast. IMPRESSION: Hiatal hernia, larger than seen in 2013. No active cardiopulmonary disease. Electronically Signed   By: Nelson Chimes M.D.   On: 07/07/2015 08:47    ECG: atrial fibrillation with RVR, minor T wave changes  TELEMETRY: atrial fibrillation, 100 bpm  IMPRESSION: 1. (New) onset atrial fibrillation with RVR - previous brief PAF postop 2011 2. Chest pain, possible coronary insufficiency with rate related angina 3. Mixed hyperlipidemia 4. HTN  RECOMMENDATION: Atrial fibrillation: CHADSVasc 5 (age 75, gender, HTN, +/- CAD). Start DOAC. Check echo. Transition IV diltiazem to PO beta blocker. If no spontaneous conversion to sinus by tomorrow, consider TEE-DCCV on Friday. Angina pectoris: Plan outpatient stress Myoview if EF/wall motion normal and enzymes normal, coronary angio on this admission if she has regional abnormalities or low EF or if troponin rise is substantial. She does have numerous coronary risk factors (HTN, HLP, chest XRT, brother with MI, age). HLP: Recheck lipids,  now on statin > 3 months HTN: Prefer beta blockers for rate control benefit, reduce ACEi dose  Time Spent Directly with Patient: 60 minutes  Sanda Klein, MD, [facility] [phone] office [phone] pager   07/07/2015, 10:32 AM

## 2015-07-07 NOTE — ED Notes (Signed)
Placed pt on bed pan.  Hand hygiene and sitting up in bed eating her lunch.

## 2015-07-07 NOTE — ED Notes (Signed)
Pt remains monitored by blood pressure, pulse ox, and 5 lead. Pt is noted to have visitor at bedside.

## 2015-07-08 ENCOUNTER — Other Ambulatory Visit: Payer: Self-pay | Admitting: Cardiology

## 2015-07-08 ENCOUNTER — Inpatient Hospital Stay (HOSPITAL_COMMUNITY): Payer: Commercial Managed Care - HMO

## 2015-07-08 DIAGNOSIS — I4891 Unspecified atrial fibrillation: Secondary | ICD-10-CM

## 2015-07-08 DIAGNOSIS — R079 Chest pain, unspecified: Secondary | ICD-10-CM

## 2015-07-08 LAB — LIPID PANEL
CHOL/HDL RATIO: 3.1 ratio
Cholesterol: 162 mg/dL (ref 0–200)
HDL: 52 mg/dL (ref >40)
LDL Cholesterol: 93 mg/dL (ref 0–99)
Triglycerides: 86 mg/dL (ref <150)
VLDL: 17 mg/dL (ref 0–40)

## 2015-07-08 LAB — TSH: TSH: 3.337 u[IU]/mL (ref 0.350–4.500)

## 2015-07-08 LAB — TROPONIN I

## 2015-07-08 MED ORDER — METOPROLOL TARTRATE 50 MG PO TABS: 50.0000 mg | ORAL_TABLET | Freq: Two times a day (BID) | ORAL | Status: DC

## 2015-07-08 MED ORDER — APIXABAN 5 MG PO TABS: 5.0000 mg | ORAL_TABLET | Freq: Two times a day (BID) | ORAL | Status: DC

## 2015-07-08 MED ORDER — ACETAMINOPHEN 325 MG PO TABS: 650.0000 mg | ORAL_TABLET | ORAL | Status: AC | PRN

## 2015-07-08 MED ORDER — SERTRALINE HCL 25 MG PO TABS: 50.0000 mg | ORAL_TABLET | Freq: Every day | ORAL | Status: DC

## 2015-07-08 MED ORDER — TRIAMTERENE-HCTZ 37.5-25 MG PO TABS: 0.5000 | ORAL_TABLET | Freq: Every day | ORAL | Status: DC

## 2015-07-08 MED ORDER — ALPRAZOLAM 0.5 MG PO TABS: 0.5000 mg | ORAL_TABLET | Freq: Every day | ORAL | Status: DC

## 2015-07-08 NOTE — Progress Notes (Signed)
Utilization review completed.  

## 2015-07-08 NOTE — Discharge Summary (Signed)
Discharge Summary    Patient ID: Deborah Randall,  MRN: [id number], DOB/AGE: Jan 24, 1941 75 y.o.  Admit date: 07/07/2015 Discharge date: 07/08/2015  Primary Care Provider: Hollace Kinnier Primary Cardiologist: Dr Sallyanne Kuster  Discharge Diagnoses    Principal Problem:   Atrial fibrillation with rapid ventricular response [facility]) Active Problems:   HX: breast cancer   Chest pain with moderate risk of acute coronary syndrome   Essential hypertension, benign   Hyperlipidemia with target LDL less than 100   Allergies No Known Allergies  Diagnostic Studies/Procedures    Echo cardiogram 07/08/15 _____________   History of Present Illness     75 y.o. Female with a history of recurrent breast cancer, admitted with AF with RVR and chest pain.   Hospital Course     75 y.o. female with a past medical history significant for brief atrial fibrillation with RVR postoperatively in 2011 (left hip fracture surgery). No cardiac workup or followup was performed at that time. Other medical problems include HTN, hyperlipidemia, treated hypothyroidism, recurrent bilateral breat cancer (Rt mastectomy, L lumpectomy and XRT 15 years ago-now was to start radiation Rx tomorrow) presented 07/07/15 with abrupt onset chest pressure radiating to left neck starting at 3 AM. She was found to have atrial fibrillation with RVR (160 bpm) without major repolarization changes on ECG and normal troponin. She was treated with IV diltiazem which reduced HR to 110-120s and alleviated her symptoms. She has not had these complaints before and does not have exertional angina or dyspnea. She was unaware of palpitations. No neurological complaints and no history of stroke/TIA or serious bleeding problems. The pt was admitted and ruled out for an MI. Her B/P was low on Diltiazem and her ACE was stopped. Beta blocker was added for rate control. Dr Sallyanne Kuster feel she is CHADSVasc 5 (age 30, gender, HTN, +/- CAD). NOAC has been started. Her LVF  by echo was normal. The plan is for OP Myoview next week and then OP cardioversion in a few weeks.    _____________  Discharge Vitals Blood pressure 115/63, pulse 80, temperature 98.1 F (36.7 C), temperature source Oral, resp. rate 18, height 5\' 4"  (1.626 m), weight 165 lb 12.6 oz (75.2 kg), SpO2 96 %.  Filed Weights   07/07/15 1800  Weight: 165 lb 12.6 oz (75.2 kg)    Labs & Radiologic Studies     CBC  Recent Labs  07/07/15 0811  WBC 11.2*  HGB 14.2  HCT 44.8  MCV 88.7  PLT Q000111Q   Basic Metabolic Panel  Recent Labs  07/07/15 0811  NA 137  K 3.8  CL 103  CO2 23  GLUCOSE 131*  BUN 14  CREATININE 1.19*  CALCIUM 9.0   Liver Function Tests No results for input(s): AST, ALT, ALKPHOS, BILITOT, PROT, ALBUMIN in the last 72 hours. No results for input(s): LIPASE, AMYLASE in the last 72 hours. Cardiac Enzymes  Recent Labs  07/07/15 1143 07/07/15 1626 07/07/15 2345  TROPONINI <0.03 <0.03 <0.03   BNP Invalid input(s): POCBNP D-Dimer No results for input(s): DDIMER in the last 72 hours. Hemoglobin A1C No results for input(s): HGBA1C in the last 72 hours. Fasting Lipid Panel  Recent Labs  07/08/15 0535  CHOL 162  HDL 52  LDLCALC 93  TRIG 86  CHOLHDL 3.1   Thyroid Function Tests  Recent Labs  07/08/15 0535  TSH 3.337    Dg Chest 2 View  07/07/2015  CLINICAL DATA:  Chest pain and indigestion  beginning last night. Left-sided neck pain. EXAM: CHEST  2 VIEW COMPARISON:  03/19/2012 FINDINGS: Heart size is normal. There is a large hiatal hernia, larger than seen on the previous study. The lungs are clear. The vascularity is normal. No effusions. No acute bone finding. Surgical clips in the right axilla and left breast. IMPRESSION: Hiatal hernia, larger than seen in 2013. No active cardiopulmonary disease. Electronically Signed   By: Nelson Chimes M.D.   On: 07/07/2015 08:47    Disposition   Pt is being discharged home today in good  condition.  Follow-up Plans & Appointments    Follow-up Information    Follow up with Sabin Gibeault, MD.   Specialty:  Cardiology   Why:  office will call you   Contact information:   [address] [place] [address] [phone]        Discharge Medications   Current Discharge Medication List    START taking these medications   Details  acetaminophen (TYLENOL) 325 MG tablet Take 2 tablets (650 mg total) by mouth every 4 (four) hours as needed for headache or mild pain.    apixaban (ELIQUIS) 5 MG TABS tablet Take 1 tablet (5 mg total) by mouth 2 (two) times daily. Qty: 60 tablet, Refills: 11    metoprolol (LOPRESSOR) 50 MG tablet Take 1 tablet (50 mg total) by mouth 2 (two) times daily. Qty: 60 tablet, Refills: 11      CONTINUE these medications which have CHANGED   Details  ALPRAZolam (XANAX) 0.5 MG tablet Take 1 tablet (0.5 mg total) by mouth daily. For anxiety Qty: 90 tablet, Refills: 1    sertraline (ZOLOFT) 25 MG tablet Take 2 tablets (50 mg total) by mouth daily. Take 2 tablets by mouth daily for depression Qty: 180 tablet, Refills: 1   Associated Diagnoses: Depression    triamterene-hydrochlorothiazide (MAXZIDE-25) 37.5-25 MG tablet Take 0.5 tablets by mouth daily. Qty: 30 tablet, Refills: 3   Associated Diagnoses: Essential hypertension, benign      CONTINUE these medications which have NOT CHANGED   Details  alendronate (FOSAMAX) 70 MG tablet Take one tablet by mouth weekly with 8 ounces water, stay upright for 30 minutes after taking, For Osteoporosis Qty: 4 tablet, Refills: 6    Cholecalciferol (VITAMIN D3) 2000 UNITS TABS Take by mouth daily.    diphenhydrAMINE (SOMINEX) 25 MG tablet Take 25 mg by mouth at bedtime as needed for sleep. Take 1 tablets once daily    Fructose-Dextrose-Phosphor Acd (ANTI-NAUSEA PO) Take 1 tablet by mouth every 8 (eight) hours as needed (nausea).    levothyroxine (SYNTHROID, LEVOTHROID) 137 MCG tablet  Take one tablet by mouth 30 minutes before breakfast for thyroid Qty: 30 tablet, Refills: 1   Associated Diagnoses: Other specified hypothyroidism    pravastatin (PRAVACHOL) 40 MG tablet Take 1 tablet by mouth every evening with meal Qty: 30 tablet, Refills: 5    ranitidine (ZANTAC) 75 MG tablet Take 75 mg by mouth 2 (two) times daily as needed (For Heartburn).       STOP taking these medications     lisinopril (PRINIVIL,ZESTRIL) 40 MG tablet      Tdap (BOOSTRIX) 5-2.5-18.5 LF-MCG/0.5 injection      zoster vaccine live, PF, (ZOSTAVAX) 29562 UNT/0.65ML injection          Aspirin prescribed at discharge?  No: Home on NOAC High Intensity Statin Prescribed? (Lipitor 40-80mg  or Crestor 20-40mg ): Yes Beta Blocker Prescribed? Yes For EF 45% or less, Was ACEI/ARB Prescribed?  No: N/A ADP Receptor Inhibitor Prescribed? (i.e. Plavix etc.-Includes Medically Managed Patients): No: N/A For EF <40%, Aldosterone Inhibitor Prescribed? No: N/A Was EF assessed during THIS hospitalization? Yes Was Cardiac Rehab II ordered? (Included Medically managed Patients): No: N/A   Outstanding Labs/Studies    Duration of Discharge Encounter   Greater than 30 minutes including physician time.  Angelena Form K PA 07/08/2015, 4:32 PM

## 2015-07-08 NOTE — Progress Notes (Signed)
Patient Name: Deborah Randall Date of Encounter: 07/08/2015  Active Problems:   Essential hypertension, benign   Hyperlipidemia with target LDL less than 100   Atrial fibrillation with rapid ventricular response ([place])   Length of Stay: 1  SUBJECTIVE  Remains in AFib, but well rate controlled and asymptomatic. BP a little low, while still on IV diltiazem.  CURRENT MEDS . apixaban  5 mg Oral BID  . famotidine  20 mg Oral Daily  . levothyroxine  137 mcg Oral QAC breakfast  . metoprolol tartrate  50 mg Oral BID  . pravastatin  40 mg Oral q1800  . sertraline  50 mg Oral Daily  . triamterene-hydrochlorothiazide  0.5 tablet Oral Daily    OBJECTIVE   Intake/Output Summary (Last 24 hours) at 07/08/15 1034 Last data filed at 07/08/15 0730  Gross per 24 hour  Intake    240 ml  Output    750 ml  Net   -510 ml   Filed Weights   07/07/15 1800  Weight: 75.2 kg (165 lb 12.6 oz)    PHYSICAL EXAM Filed Vitals:   07/08/15 0157 07/08/15 0659 07/08/15 0955 07/08/15 0959  BP: 80/49 85/57 80/50  82/50  Pulse: 51 58 54   Temp: 97.6 F (36.4 C) 98.1 F (36.7 C)    TempSrc:      Resp: 18 18    Height:      Weight:      SpO2: 97% 96% 95%    General: Alert, oriented x3, no distress Head: no evidence of trauma, PERRL, EOMI, no exophtalmos or lid lag, no myxedema, no xanthelasma; normal ears, nose and oropharynx Neck: normal jugular venous pulsations and no hepatojugular reflux; brisk carotid pulses without delay and no carotid bruits Chest: clear to auscultation, no signs of consolidation by percussion or palpation, normal fremitus, symmetrical and full respiratory excursions Cardiovascular: normal position and quality of the apical impulse, irregular rhythm, normal first and second heart sounds, no rubs or gallops, no murmur Abdomen: no tenderness or distention, no masses by palpation, no abnormal pulsatility or arterial bruits, normal bowel sounds, no hepatosplenomegaly Extremities: no  clubbing, cyanosis or edema; 2+ radial, ulnar and brachial pulses bilaterally; 2+ right femoral, posterior tibial and dorsalis pedis pulses; 2+ left femoral, posterior tibial and dorsalis pedis pulses; no subclavian or femoral bruits Neurological: grossly nonfocal  LABS  CBC  Recent Labs  07/07/15 0811  WBC 11.2*  HGB 14.2  HCT 44.8  MCV 88.7  PLT [id number]   Basic Metabolic Panel  Recent Labs  07/07/15 0811  NA 137  K 3.8  CL 103  CO2 23  GLUCOSE 131*  BUN 14  CREATININE 1.19*  CALCIUM 9.0   Cardiac Enzymes  Recent Labs  07/07/15 1143 07/07/15 1626 07/07/15 2345  TROPONINI <0.03 <0.03 <0.03   Fasting Lipid Panel  Recent Labs  07/08/15 0535  CHOL 162  HDL 52  LDLCALC 93  TRIG 86  CHOLHDL 3.1   Thyroid Function Tests  Recent Labs  07/08/15 0535  TSH 3.337    Radiology Studies Imaging results have been reviewed and Dg Chest 2 View  07/07/2015  CLINICAL DATA:  Chest pain and indigestion beginning last night. Left-sided neck pain. EXAM: CHEST  2 VIEW COMPARISON:  03/19/2012 FINDINGS: Heart size is normal. There is a large hiatal hernia, larger than seen on the previous study. The lungs are clear. The vascularity is normal. No effusions. No acute bone finding. Surgical clips in the right axilla and  left breast. IMPRESSION: Hiatal hernia, larger than seen in 2013. No active cardiopulmonary disease. Electronically Signed   By: Nelson Chimes M.D.   On: 07/07/2015 08:47    TELE atrial fibrillation with rates 50-80s   ASSESSMENT AND PLAN  1. Atrial fibrillation: CHADSVasc 5 (age 75, gender, HTN, +/- CAD). Started DOAC. Pending echo. Stop IV diltiazem. Treat with PO beta blocker. Could possibly plan TEE-DCCV on Friday, but if she remains asymptomatic, would be also suited for elective DCCV after 3 weeks of anticoagulation. 2. Angina pectoris: Plan outpatient stress Myoview if EF/wall motion normal and enzymes normal, coronary angio on this admission if she has  regional abnormalities or low EF or if troponin rise is substantial. She does have numerous coronary risk factors (HTN, HLP, chest XRT, brother with MI, age). 3. HLP: Now on statin > 3 months and lipid profile looks good. 4. HTN: Prefer beta blockers for rate control benefit, stopped ACEi. If BP still low when diltiazem wears off, might need to also stop the diuretic combo.   Sanda Klein, MD, [facility] [phone] office [phone] pager 07/08/2015 10:34 AM

## 2015-07-14 ENCOUNTER — Telehealth (HOSPITAL_COMMUNITY): Payer: Self-pay

## 2015-07-14 NOTE — Telephone Encounter (Signed)
Encounter complete. 

## 2015-07-15 ENCOUNTER — Ambulatory Visit (HOSPITAL_COMMUNITY)
Admission: RE | Admit: 2015-07-15 | Discharge: 2015-07-15 | Disposition: A | Discharge status: Home / Self Care | Payer: Commercial Managed Care - HMO | Source: Ambulatory Visit | Attending: Cardiology | Admitting: Cardiology

## 2015-07-15 DIAGNOSIS — R079 Chest pain, unspecified: Secondary | ICD-10-CM | POA: Insufficient documentation

## 2015-07-15 DIAGNOSIS — I10 Essential (primary) hypertension: Secondary | ICD-10-CM | POA: Insufficient documentation

## 2015-07-15 DIAGNOSIS — Z8249 Family history of ischemic heart disease and other diseases of the circulatory system: Secondary | ICD-10-CM | POA: Insufficient documentation

## 2015-07-15 DIAGNOSIS — R002 Palpitations: Secondary | ICD-10-CM | POA: Diagnosis not present

## 2015-07-15 LAB — MYOCARDIAL PERFUSION IMAGING
Estimated workload: 1 METS
LV dias vol: 46 mL
LV sys vol: 20 mL
Peak BP: 122 mmHg
Peak HR: 106 {beats}/min
Percent of predicted max HR: 73 %
Rest HR: 88 {beats}/min
SDS: 1
SRS: 0
SSS: 1
Stage 1 DBP: 97 mmHg
Stage 1 Grade: 0 %
Stage 1 HR: 87 {beats}/min
Stage 1 SBP: 124 mmHg
Stage 1 Speed: 0 mph
Stage 2 Grade: 0 %
Stage 2 HR: 88 {beats}/min
Stage 2 Speed: 0 mph
Stage 3 DBP: 78 mmHg
Stage 3 Grade: 0 %
Stage 3 HR: 106 {beats}/min
Stage 3 SBP: 122 mmHg
Stage 3 Speed: 0 mph
Stage 4 DBP: 84 mmHg
Stage 4 Grade: 0 %
Stage 4 HR: 95 {beats}/min
Stage 4 SBP: 121 mmHg
Stage 4 Speed: 0 mph
TID: 1.03

## 2015-07-15 MED ORDER — AMINOPHYLLINE 25 MG/ML IV SOLN
75.0000 mg | Freq: Once | INTRAVENOUS | Status: AC
Start: 2015-07-15 — End: 2015-07-15
  Administered 2015-07-15: 75 mg via INTRAVENOUS

## 2015-07-15 MED ORDER — TECHNETIUM TC 99M SESTAMIBI GENERIC - CARDIOLITE
30.5000 | Freq: Once | INTRAVENOUS | Status: AC | PRN
  Administered 2015-07-15: 31 via INTRAVENOUS

## 2015-07-15 MED ORDER — REGADENOSON 0.4 MG/5ML IV SOLN
0.4000 mg | Freq: Once | INTRAVENOUS | Status: AC
  Administered 2015-07-15: 0.4 mg via INTRAVENOUS

## 2015-07-15 MED ORDER — TECHNETIUM TC 99M SESTAMIBI GENERIC - CARDIOLITE
10.8000 | Freq: Once | INTRAVENOUS | Status: AC | PRN
  Administered 2015-07-15: 11 via INTRAVENOUS

## 2015-07-19 ENCOUNTER — Telehealth: Payer: Self-pay | Admitting: Cardiovascular Disease

## 2015-07-19 NOTE — Telephone Encounter (Signed)
Stress test results given. Verbalized understanding.

## 2015-07-19 NOTE — Telephone Encounter (Signed)
New message ° ° ° ° ° °Calling to get stress test results °

## 2015-07-21 ENCOUNTER — Ambulatory Visit: Payer: Commercial Managed Care - HMO | Admitting: Cardiology

## 2015-08-05 ENCOUNTER — Other Ambulatory Visit: Payer: Self-pay

## 2015-08-05 DIAGNOSIS — E038 Other specified hypothyroidism: Secondary | ICD-10-CM

## 2015-08-05 MED ORDER — LEVOTHYROXINE SODIUM 137 MCG PO TABS: ORAL_TABLET | ORAL | Status: DC

## 2015-08-05 NOTE — Telephone Encounter (Signed)
Refill request received from [facility] for levothyroxine 137 mcg tablets.   Prescription sent.

## 2015-08-11 ENCOUNTER — Encounter: Payer: Self-pay | Admitting: Cardiology

## 2015-08-11 ENCOUNTER — Encounter: Payer: Self-pay | Admitting: Cardiovascular Disease

## 2015-08-11 ENCOUNTER — Ambulatory Visit (INDEPENDENT_AMBULATORY_CARE_PROVIDER_SITE_OTHER): Payer: Commercial Managed Care - HMO | Admitting: Cardiology

## 2015-08-11 VITALS — BP 120/94 | HR 100 | Ht 64.0 in | Wt 172.0 lb

## 2015-08-11 DIAGNOSIS — R5383 Other fatigue: Secondary | ICD-10-CM

## 2015-08-11 DIAGNOSIS — Z01818 Encounter for other preprocedural examination: Secondary | ICD-10-CM

## 2015-08-11 DIAGNOSIS — Z7901 Long term (current) use of anticoagulants: Secondary | ICD-10-CM

## 2015-08-11 DIAGNOSIS — I4891 Unspecified atrial fibrillation: Secondary | ICD-10-CM

## 2015-08-11 DIAGNOSIS — Z853 Personal history of malignant neoplasm of breast: Secondary | ICD-10-CM

## 2015-08-11 DIAGNOSIS — N183 Chronic kidney disease, stage 3 unspecified: Secondary | ICD-10-CM | POA: Insufficient documentation

## 2015-08-11 DIAGNOSIS — N189 Chronic kidney disease, unspecified: Secondary | ICD-10-CM

## 2015-08-11 DIAGNOSIS — I480 Paroxysmal atrial fibrillation: Secondary | ICD-10-CM

## 2015-08-11 DIAGNOSIS — I10 Essential (primary) hypertension: Secondary | ICD-10-CM

## 2015-08-11 NOTE — Assessment & Plan Note (Addendum)
Left breast--s/p Lt mastectomy/2005;  Right breast T1aN0=Stage I May 2002.

## 2015-08-11 NOTE — Assessment & Plan Note (Signed)
ACE stopped during her hospitalization in Feb secondary to low B/P

## 2015-08-11 NOTE — Patient Instructions (Signed)
Your physician has recommended that you have a Cardioversion (DCCV). Electrical Cardioversion uses a jolt of electricity to your heart either through paddles or wired patches attached to your chest. This is a controlled, usually prescheduled, procedure. Defibrillation is done under light anesthesia in the hospital, and you usually go home the day of the procedure. This is done to get your heart back into a normal rhythm. You are not awake for the procedure. Please see the instruction sheet given to you today. This will be done by Dr. Sallyanne Kuster.  Your physician recommends that you return for lab work within 7 days of the procedure.  Your physician recommends that you schedule a follow-up appointment in: 4 weeks with [facility] or dr Sallyanne Kuster.

## 2015-08-11 NOTE — Assessment & Plan Note (Signed)
GFR 44 in Feb 2017

## 2015-08-11 NOTE — Assessment & Plan Note (Signed)
Eliquis has not been interrupted since discharge

## 2015-08-11 NOTE — Progress Notes (Signed)
08/11/2015 Deborah Randall   Oct 25, 1940  [id number]  Primary Physician Hollace Kinnier, DO Primary Cardiologist: Dr Sallyanne Kuster  HPI:  75 y.o. female with a past medical history significant for brief atrial fibrillation with RVR postoperatively in 2011 (left hip fracture surgery). No cardiac workup or followup was performed at that time. Other medical problems include HTN, hyperlipidemia, treated hypothyroidism, breast cancer (Lt mastectomy, L lumpectomy and XRT 15 years ago)- presented 07/07/15 with atrial fibrillation with RVR (160 bpm) with chest pain. She was treated with IV diltiazem which reduced HR to 110-120s and alleviated her symptoms. The pt was admitted and ruled out for an MI. Her B/P was low on Diltiazem and Lisinopril. These were stopped and she was placed on Beta blocker for rate control. Dr Sallyanne Kuster feel she is CHADSVasc 5 (age 26, gender, HTN, +/- CAD). NOAC was started. Her LVF by echo was normal, her LA  moderately dilated. OP Myoview was low risk. She has been on uninterrupted Eliquis. She is unaware of her AF. She is in the office today to be set up for OP DCCV.    Current Outpatient Prescriptions  Medication Sig Dispense Refill  . acetaminophen (TYLENOL) 325 MG tablet Take 2 tablets (650 mg total) by mouth every 4 (four) hours as needed for headache or mild pain.    [name] alendronate (FOSAMAX) 70 MG tablet Take one tablet by mouth weekly with 8 ounces water, stay upright for 30 minutes after taking, For Osteoporosis 4 tablet 6  . ALPRAZolam (XANAX) 0.5 MG tablet Take 1 tablet (0.5 mg total) by mouth daily. For anxiety 90 tablet 1  . apixaban (ELIQUIS) 5 MG TABS tablet Take 1 tablet (5 mg total) by mouth 2 (two) times daily. 60 tablet 11  . Cholecalciferol (VITAMIN D3) 2000 UNITS TABS Take by mouth daily.    . diphenhydrAMINE (SOMINEX) 25 MG tablet Take 25 mg by mouth at bedtime as needed for sleep. Take 1 tablets once daily    . Fructose-Dextrose-Phosphor Acd (ANTI-NAUSEA PO) Take 1  tablet by mouth every 8 (eight) hours as needed (nausea).    [name] levothyroxine (SYNTHROID, LEVOTHROID) 137 MCG tablet Take one tablet by mouth 30 minutes before breakfast for thyroid 30 tablet 1  . metoprolol (LOPRESSOR) 50 MG tablet Take 1 tablet (50 mg total) by mouth 2 (two) times daily. 60 tablet 11  . pravastatin (PRAVACHOL) 40 MG tablet Take 1 tablet by mouth every evening with meal 30 tablet 5  . ranitidine (ZANTAC) 75 MG tablet Take 75 mg by mouth 2 (two) times daily as needed (For Heartburn).     . sertraline (ZOLOFT) 25 MG tablet Take 2 tablets (50 mg total) by mouth daily. Take 2 tablets by mouth daily for depression 180 tablet 1  . triamterene-hydrochlorothiazide (MAXZIDE-25) 37.5-25 MG tablet Take 0.5 tablets by mouth daily. 30 tablet 3   No current facility-administered medications for this visit.    No Known Allergies  Social History   Social History  . Marital Status: Widowed    Spouse Name: N/A  . Number of Children: N/A  . Years of Education: N/A   Occupational History  . 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 Smoking status: Never Smoker   . Smokeless tobacco: Never Used  . Alcohol Use: No  . Drug Use: No  . Sexual Activity: No   Other Topics Concern  . Not on file   Social History Narrative  Lives alone   Widow   2 sons , one son decease, one son disable    Works at [employer] in [employer]   Enjoys going places with friends   Never smoked   Alcohol none   Exercise -walk, works in yard      Review of Systems: General: negative for chills, fever, night sweats or weight changes.  She has a tooth that needs to be pulled- I suggested she put this off if possible as she won't be able to stop her anticoagulation.  Cardiovascular: negative for chest pain, dyspnea on exertion, edema, orthopnea, palpitations, paroxysmal nocturnal dyspnea or shortness of breath Dermatological: negative for rash Respiratory: negative for  cough or wheezing Urologic: negative for hematuria Abdominal: negative for nausea, vomiting, diarrhea, bright red blood per rectum, melena, or hematemesis Neurologic: negative for visual changes, syncope, or dizziness All other systems reviewed and are otherwise negative except as noted above.    Blood pressure 120/94, pulse 100, height 5\' 4"  (1.626 m), weight 172 lb (78.019 kg).  General appearance: alert, cooperative and no distress Neck: no JVD Lungs: clear to auscultation bilaterally Heart: irregularly irregular rhythm Abdomen: non tender Extremities: no edema Pulses: 2+ and symmetric Skin: Skin color, texture, turgor normal. No rashes or lesions Neurologic: Grossly normal   ASSESSMENT AND PLAN:   Atrial fibrillation with rapid ventricular response ([place]) Discharged 07/08/15 after an admission for AF with RVR CHADs VASc=5 She remains in AF on exam today with VR 90  Chronic anticoagulation Eliquis has not been interrupted since discharge  Essential hypertension, benign ACE stopped during her hospitalization in Feb secondary to low B/P  Chronic renal insufficiency, stage III (moderate) GFR 44 in Feb 2017   HX: breast cancer Left breast--s/p Lt mastectomy/2005;  Right breast T1aN0=Stage I May 2002.    PLAN  Will arrange for OP DCCV. I explained the procedure and risk and benefits and she is agreeable to proceed. She tells me her tooth problem is not urgent and can be put off for now.   Kerin Ransom K PA-C 08/11/2015 10:58 AM

## 2015-08-11 NOTE — Assessment & Plan Note (Signed)
Discharged 07/08/15 after an admission for AF with RVR CHADs VASc=5 She remains in AF on exam today with VR 90

## 2015-08-12 LAB — CBC
HCT: 43.1 % (ref 36.0–46.0)
Hemoglobin: 13.9 g/dL (ref 12.0–15.0)
MCH: 27.7 pg (ref 26.0–34.0)
MCHC: 32.3 g/dL (ref 30.0–36.0)
MCV: 86 fL (ref 78.0–100.0)
MPV: 9.8 fL (ref 8.6–12.4)
Platelets: 186 10*3/uL (ref 150–400)
RBC: 5.01 MIL/uL (ref 3.87–5.11)
RDW: 13.9 % (ref 11.5–15.5)
WBC: 7.8 10*3/uL (ref 4.0–10.5)

## 2015-08-13 LAB — PROTIME-INR
INR: 1.29 (ref <1.50)
Prothrombin Time: 16.3 seconds — ABNORMAL HIGH (ref 11.6–15.2)

## 2015-08-13 LAB — APTT: aPTT: 36 seconds (ref 24–37)

## 2015-08-13 LAB — BASIC METABOLIC PANEL
BUN: 14 mg/dL (ref 7–25)
CO2: 29 mmol/L (ref 20–31)
Calcium: 8.9 mg/dL (ref 8.6–10.4)
Chloride: 101 mmol/L (ref 98–110)
Creat: 1.08 mg/dL — ABNORMAL HIGH (ref 0.60–0.93)
Glucose, Bld: 118 mg/dL — ABNORMAL HIGH (ref 65–99)
Potassium: 3.9 mmol/L (ref 3.5–5.3)
Sodium: 139 mmol/L (ref 135–146)

## 2015-08-13 LAB — TSH: TSH: 4.63 mIU/L — ABNORMAL HIGH

## 2015-08-17 ENCOUNTER — Other Ambulatory Visit: Payer: Self-pay | Admitting: *Deleted

## 2015-08-17 DIAGNOSIS — Z01810 Encounter for preprocedural cardiovascular examination: Secondary | ICD-10-CM

## 2015-08-19 ENCOUNTER — Ambulatory Visit: Payer: Commercial Managed Care - HMO | Admitting: Internal Medicine

## 2015-08-19 ENCOUNTER — Encounter (HOSPITAL_COMMUNITY)
Admission: RE | Disposition: A | Discharge status: Home / Self Care | Payer: Self-pay | Source: Ambulatory Visit | Attending: Cardiovascular Disease

## 2015-08-19 ENCOUNTER — Encounter (HOSPITAL_COMMUNITY): Payer: Self-pay | Admitting: *Deleted

## 2015-08-19 ENCOUNTER — Ambulatory Visit (HOSPITAL_COMMUNITY)
Admission: RE | Admit: 2015-08-19 | Discharge: 2015-08-19 | Disposition: A | Discharge status: Home / Self Care | Payer: Commercial Managed Care - HMO | Source: Ambulatory Visit | Attending: Cardiovascular Disease | Admitting: Cardiovascular Disease

## 2015-08-19 ENCOUNTER — Ambulatory Visit (HOSPITAL_COMMUNITY): Payer: Commercial Managed Care - HMO | Admitting: Anesthesiology

## 2015-08-19 DIAGNOSIS — N183 Chronic kidney disease, stage 3 (moderate): Secondary | ICD-10-CM | POA: Diagnosis not present

## 2015-08-19 DIAGNOSIS — Z853 Personal history of malignant neoplasm of breast: Secondary | ICD-10-CM | POA: Diagnosis not present

## 2015-08-19 DIAGNOSIS — Z7901 Long term (current) use of anticoagulants: Secondary | ICD-10-CM | POA: Diagnosis not present

## 2015-08-19 DIAGNOSIS — Z79899 Other long term (current) drug therapy: Secondary | ICD-10-CM | POA: Insufficient documentation

## 2015-08-19 DIAGNOSIS — Z01810 Encounter for preprocedural cardiovascular examination: Secondary | ICD-10-CM

## 2015-08-19 DIAGNOSIS — I481 Persistent atrial fibrillation: Secondary | ICD-10-CM | POA: Diagnosis not present

## 2015-08-19 DIAGNOSIS — I129 Hypertensive chronic kidney disease with stage 1 through stage 4 chronic kidney disease, or unspecified chronic kidney disease: Secondary | ICD-10-CM | POA: Diagnosis not present

## 2015-08-19 DIAGNOSIS — Z9012 Acquired absence of left breast and nipple: Secondary | ICD-10-CM | POA: Diagnosis not present

## 2015-08-19 DIAGNOSIS — I4891 Unspecified atrial fibrillation: Secondary | ICD-10-CM | POA: Insufficient documentation

## 2015-08-19 DIAGNOSIS — I4819 Other persistent atrial fibrillation: Secondary | ICD-10-CM | POA: Insufficient documentation

## 2015-08-19 DIAGNOSIS — E039 Hypothyroidism, unspecified: Secondary | ICD-10-CM | POA: Insufficient documentation

## 2015-08-19 DIAGNOSIS — E785 Hyperlipidemia, unspecified: Secondary | ICD-10-CM | POA: Insufficient documentation

## 2015-08-19 HISTORY — PX: CARDIOVERSION: SHX1299

## 2015-08-19 SURGERY — CARDIOVERSION
Anesthesia: General

## 2015-08-19 MED ORDER — SODIUM CHLORIDE 0.9% FLUSH: 3.0000 mL | Freq: Two times a day (BID) | INTRAVENOUS | Status: DC

## 2015-08-19 MED ORDER — SODIUM CHLORIDE 0.9 % IV SOLN
INTRAVENOUS | Status: DC
  Administered 2015-08-19: 500 mL via INTRAVENOUS

## 2015-08-19 MED ORDER — PROPOFOL 10 MG/ML IV BOLUS
INTRAVENOUS | Status: DC | PRN
  Administered 2015-08-19: 30 mg via INTRAVENOUS

## 2015-08-19 MED ORDER — SODIUM CHLORIDE 0.9% FLUSH: 3.0000 mL | INTRAVENOUS | Status: DC | PRN

## 2015-08-19 MED ORDER — SODIUM CHLORIDE 0.9 % IV SOLN: 250.0000 mL | INTRAVENOUS | Status: DC

## 2015-08-19 MED ORDER — HYDROCORTISONE 1 % EX CREA: 1.0000 "application " | TOPICAL_CREAM | Freq: Three times a day (TID) | CUTANEOUS | Status: DC | PRN

## 2015-08-19 NOTE — Discharge Instructions (Signed)
Electrical Cardioversion, Care After °Refer to this sheet in the next few weeks. These instructions provide you with information on caring for yourself after your procedure. Your health care provider may also give you more specific instructions. Your treatment has been planned according to current medical practices, but problems sometimes occur. Call your health care provider if you have any problems or questions after your procedure. °WHAT TO EXPECT AFTER THE PROCEDURE °After your procedure, it is typical to have the following sensations: °· Some redness on the skin where the shocks were delivered. If this is tender, a sunburn lotion or hydrocortisone cream may help. °· Possible return of an abnormal heart rhythm within hours or days after the procedure. °HOME CARE INSTRUCTIONS °· Take medicines only as directed by your health care provider. Be sure you understand how and when to take your medicine. °· Learn how to feel your pulse and check it often. °· Limit your activity for 48 hours after the procedure or as directed by your health care provider. °· Avoid or minimize caffeine and other stimulants as directed by your health care provider. °SEEK MEDICAL CARE IF: °· You feel like your heart is beating too fast or your pulse is not regular. °· You have any questions about your medicines. °· You have bleeding that will not stop. °SEEK IMMEDIATE MEDICAL CARE IF: °· You are dizzy or feel faint. °· It is hard to breathe or you feel short of breath. °· There is a change in discomfort in your chest. °· Your speech is slurred or you have trouble moving an arm or leg on one side of your body. °· You get a serious muscle cramp that does not go away. °· Your fingers or toes turn cold or blue. °  °This information is not intended to replace advice given to you by your health care provider. Make sure you discuss any questions you have with your health care provider. °  °Document Released: 02/26/2013 Document Revised: 05/29/2014  Document Reviewed: 02/26/2013 °Elsevier Interactive Patient Education ©2016 [employer] ° °

## 2015-08-19 NOTE — Anesthesia Procedure Notes (Signed)
Date/Time: 08/19/2015 9:14 AM Performed by: Merrilyn Puma B Pre-anesthesia Checklist: Patient identified, Emergency Drugs available, Suction available, Patient being monitored and Timeout performed Patient Re-evaluated:Patient Re-evaluated prior to inductionOxygen Delivery Method: Ambu bag Preoxygenation: Pre-oxygenation with 100% oxygen Intubation Type: IV induction Placement Confirmation: breath sounds checked- equal and bilateral Dental Injury: Teeth and Oropharynx as per pre-operative assessment

## 2015-08-19 NOTE — Interval H&P Note (Signed)
History and Physical Interval Note:  08/19/2015 9:01 AM  Deborah Randall  has presented today for surgery, with the diagnosis of AFIB  The various methods of treatment have been discussed with the patient and family. After consideration of risks, benefits and other options for treatment, the patient has consented to  Procedure(s): CARDIOVERSION (N/A) as a surgical intervention .  The patient's history has been reviewed, patient examined, no change in status, stable for surgery.  I have reviewed the patient's chart and labs.  Questions were answered to the patient's satisfaction.     Jimmie Rueter

## 2015-08-19 NOTE — Anesthesia Preprocedure Evaluation (Signed)
Anesthesia Evaluation    Reviewed: Allergy & Precautions, NPO status , Patient's Chart, lab work & pertinent test results, reviewed documented beta blocker date and time   History of Anesthesia Complications Negative for: history of anesthetic complications  Airway Mallampati: III  TM Distance: >3 FB Neck ROM: Full    Dental  (+) Teeth Intact   Pulmonary neg pulmonary ROS,    breath sounds clear to auscultation       Cardiovascular hypertension, Pt. on medications and Pt. on home beta blockers (-) angina(-) Past MI  Rhythm:Irregular     Neuro/Psych  Headaches, Anxiety Depression    GI/Hepatic Neg liver ROS, GERD  Controlled and Medicated,  Endo/Other  Hypothyroidism   Renal/GU Renal InsufficiencyRenal disease     Musculoskeletal   Abdominal   Peds  Hematology   Anesthesia Other Findings   Reproductive/Obstetrics                             Anesthesia Physical Anesthesia Plan  ASA: III  Anesthesia Plan: General   Post-op Pain Management:    Induction:   Airway Management Planned: Mask  Additional Equipment: None  Intra-op Plan:   Post-operative Plan:   Informed Consent: I have reviewed the patients History and Physical, chart, labs and discussed the procedure including the risks, benefits and alternatives for the proposed anesthesia with the patient or authorized representative who has indicated his/her understanding and acceptance.   Dental advisory given  Plan Discussed with: Anesthesiologist and CRNA  Anesthesia Plan Comments:         Anesthesia Quick Evaluation

## 2015-08-19 NOTE — Op Note (Signed)
Procedure: Electrical Cardioversion Indications:  Atrial Fibrillation  Procedure Details:  Consent: Risks of procedure as well as the alternatives and risks of each were explained to the (patient/caregiver).  Consent for procedure obtained.  Time Out: Verified patient identification, verified procedure, site/side was marked, verified correct patient position, special equipment/implants available, medications/allergies/relevent history reviewed, required imaging and test results available.  Performed  Patient placed on cardiac monitor, pulse oximetry, supplemental oxygen as necessary.  Sedation given: IV propofol, Dr. Ermalene Postin Pacer pads placed anterior and posterior chest.  Cardioverted 1 time(s).  Cardioversion with synchronized biphasic 120J shock.  Evaluation: Findings: Post procedure EKG shows: NSR Complications: None Patient did tolerate procedure well.  Time Spent Directly with the Patient:  30 minutes   Mischa Pollard 08/19/2015, 9:39 AM

## 2015-08-19 NOTE — H&P (View-Only) (Signed)
08/11/2015 Deborah Randall   1940-09-14  [id number]  Primary Physician Hollace Kinnier, DO Primary Cardiologist: Dr Sallyanne Kuster  HPI:  75 y.o. female with a past medical history significant for brief atrial fibrillation with RVR postoperatively in 2011 (left hip fracture surgery). No cardiac workup or followup was performed at that time. Other medical problems include HTN, hyperlipidemia, treated hypothyroidism, breast cancer (Lt mastectomy, L lumpectomy and XRT 15 years ago)- presented 07/07/15 with atrial fibrillation with RVR (160 bpm) with chest pain. She was treated with IV diltiazem which reduced HR to 110-120s and alleviated her symptoms. The pt was admitted and ruled out for an MI. Her B/P was low on Diltiazem and Lisinopril. These were stopped and she was placed on Beta blocker for rate control. Dr Sallyanne Kuster feel she is CHADSVasc 5 (age 89, gender, HTN, +/- CAD). NOAC was started. Her LVF by echo was normal, her LA  moderately dilated. OP Myoview was low risk. She has been on uninterrupted Eliquis. She is unaware of her AF. She is in the office today to be set up for OP DCCV.    Current Outpatient Prescriptions  Medication Sig Dispense Refill  . acetaminophen (TYLENOL) 325 MG tablet Take 2 tablets (650 mg total) by mouth every 4 (four) hours as needed for headache or mild pain.    [name] alendronate (FOSAMAX) 70 MG tablet Take one tablet by mouth weekly with 8 ounces water, stay upright for 30 minutes after taking, For Osteoporosis 4 tablet 6  . ALPRAZolam (XANAX) 0.5 MG tablet Take 1 tablet (0.5 mg total) by mouth daily. For anxiety 90 tablet 1  . apixaban (ELIQUIS) 5 MG TABS tablet Take 1 tablet (5 mg total) by mouth 2 (two) times daily. 60 tablet 11  . Cholecalciferol (VITAMIN D3) 2000 UNITS TABS Take by mouth daily.    . diphenhydrAMINE (SOMINEX) 25 MG tablet Take 25 mg by mouth at bedtime as needed for sleep. Take 1 tablets once daily    . Fructose-Dextrose-Phosphor Acd (ANTI-NAUSEA PO) Take 1  tablet by mouth every 8 (eight) hours as needed (nausea).    [name] levothyroxine (SYNTHROID, LEVOTHROID) 137 MCG tablet Take one tablet by mouth 30 minutes before breakfast for thyroid 30 tablet 1  . metoprolol (LOPRESSOR) 50 MG tablet Take 1 tablet (50 mg total) by mouth 2 (two) times daily. 60 tablet 11  . pravastatin (PRAVACHOL) 40 MG tablet Take 1 tablet by mouth every evening with meal 30 tablet 5  . ranitidine (ZANTAC) 75 MG tablet Take 75 mg by mouth 2 (two) times daily as needed (For Heartburn).     . sertraline (ZOLOFT) 25 MG tablet Take 2 tablets (50 mg total) by mouth daily. Take 2 tablets by mouth daily for depression 180 tablet 1  . triamterene-hydrochlorothiazide (MAXZIDE-25) 37.5-25 MG tablet Take 0.5 tablets by mouth daily. 30 tablet 3   No current facility-administered medications for this visit.    No Known Allergies  Social History   Social History  . Marital Status: Widowed    Spouse Name: N/A  . Number of Children: N/A  . Years of Education: N/A   Occupational History  . 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 Smoking status: Never Smoker   . Smokeless tobacco: Never Used  . Alcohol Use: No  . Drug Use: No  . Sexual Activity: No   Other Topics Concern  . Not on file   Social History Narrative  Lives alone   Widow   2 sons , one son decease, one son disable    Works at [employer] in [employer]   Enjoys going places with friends   Never smoked   Alcohol none   Exercise -walk, works in yard      Review of Systems: General: negative for chills, fever, night sweats or weight changes.  She has a tooth that needs to be pulled- I suggested she put this off if possible as she won't be able to stop her anticoagulation.  Cardiovascular: negative for chest pain, dyspnea on exertion, edema, orthopnea, palpitations, paroxysmal nocturnal dyspnea or shortness of breath Dermatological: negative for rash Respiratory: negative for  cough or wheezing Urologic: negative for hematuria Abdominal: negative for nausea, vomiting, diarrhea, bright red blood per rectum, melena, or hematemesis Neurologic: negative for visual changes, syncope, or dizziness All other systems reviewed and are otherwise negative except as noted above.    Blood pressure 120/94, pulse 100, height 5\' 4"  (1.626 m), weight 172 lb (78.019 kg).  General appearance: alert, cooperative and no distress Neck: no JVD Lungs: clear to auscultation bilaterally Heart: irregularly irregular rhythm Abdomen: non tender Extremities: no edema Pulses: 2+ and symmetric Skin: Skin color, texture, turgor normal. No rashes or lesions Neurologic: Grossly normal   ASSESSMENT AND PLAN:   Atrial fibrillation with rapid ventricular response ([place]) Discharged 07/08/15 after an admission for AF with RVR CHADs VASc=5 She remains in AF on exam today with VR 90  Chronic anticoagulation Eliquis has not been interrupted since discharge  Essential hypertension, benign ACE stopped during her hospitalization in Feb secondary to low B/P  Chronic renal insufficiency, stage III (moderate) GFR 44 in Feb 2017   HX: breast cancer Left breast--s/p Lt mastectomy/2005;  Right breast T1aN0=Stage I May 2002.    PLAN  Will arrange for OP DCCV. I explained the procedure and risk and benefits and she is agreeable to proceed. She tells me her tooth problem is not urgent and can be put off for now.   Kerin Ransom K PA-C 08/11/2015 10:58 AM

## 2015-08-19 NOTE — Transfer of Care (Signed)
Immediate Anesthesia Transfer of Care Note  Patient: Deborah Randall  Procedure(s) Performed: Procedure(s): CARDIOVERSION (N/A)  Patient Location: Endoscopy Unit  Anesthesia Type:General  Level of Consciousness: awake, alert  and oriented  Airway & Oxygen Therapy: Patient Spontanous Breathing  Post-op Assessment: Report given to RN, Post -op Vital signs reviewed and stable and Patient moving all extremities X 4  Post vital signs: Reviewed and stable  Last Vitals:  Filed Vitals:   08/19/15 0840  BP: 129/91  Pulse: 103  Temp: 36.7 C  Resp: 14   HR 56, BP 92/58, RR 14, Sats 0000000 on RA Complications: No apparent anesthesia complications

## 2015-08-20 ENCOUNTER — Encounter (HOSPITAL_COMMUNITY): Payer: Self-pay | Admitting: Cardiovascular Disease

## 2015-08-20 NOTE — Anesthesia Postprocedure Evaluation (Signed)
Anesthesia Post Note  Patient: Deborah Randall  Procedure(s) Performed: Procedure(s) (LRB): CARDIOVERSION (N/A)  Patient location during evaluation: Endoscopy Anesthesia Type: General Level of consciousness: awake Pain management: pain level controlled Vital Signs Assessment: post-procedure vital signs reviewed and stable Respiratory status: spontaneous breathing and respiratory function stable Cardiovascular status: stable Postop Assessment: no signs of nausea or vomiting Anesthetic complications: no    Last Vitals:  Filed Vitals:   08/19/15 0930 08/19/15 0940  BP: 88/51 96/66  Pulse: 56 57  Temp:    Resp: 12 19    Last Pain: There were no vitals filed for this visit.               Chidiebere Wynn

## 2015-09-13 ENCOUNTER — Encounter: Payer: Self-pay | Admitting: Cardiovascular Disease

## 2015-09-13 ENCOUNTER — Ambulatory Visit (INDEPENDENT_AMBULATORY_CARE_PROVIDER_SITE_OTHER): Payer: Commercial Managed Care - HMO | Admitting: Cardiovascular Disease

## 2015-09-13 VITALS — BP 130/72 | HR 57 | Ht 64.0 in | Wt 170.0 lb

## 2015-09-13 DIAGNOSIS — E038 Other specified hypothyroidism: Secondary | ICD-10-CM | POA: Diagnosis not present

## 2015-09-13 DIAGNOSIS — E034 Atrophy of thyroid (acquired): Secondary | ICD-10-CM

## 2015-09-13 DIAGNOSIS — Z923 Personal history of irradiation: Secondary | ICD-10-CM

## 2015-09-13 DIAGNOSIS — E7800 Pure hypercholesterolemia, unspecified: Secondary | ICD-10-CM | POA: Diagnosis not present

## 2015-09-13 DIAGNOSIS — I10 Essential (primary) hypertension: Secondary | ICD-10-CM | POA: Diagnosis not present

## 2015-09-13 DIAGNOSIS — I480 Paroxysmal atrial fibrillation: Secondary | ICD-10-CM

## 2015-09-13 NOTE — Progress Notes (Signed)
Patient ID: Deborah Randall, female   DOB: [birth year], 75 y.o.   MRN: [id number]    Cardiology Office Note    Date:  09/13/2015   ID:  Deborah Randall, DOB [birth year], MRN [id number]  PCP:  Hollace Kinnier, DO  Cardiologist:  Sanda Klein, MD   Chief Complaint  Patient presents with  . 1 MONTH    FOLLOW UP AFTER CARDIOVERSION  . Dizziness  . Edema    ANKLES AND FEET    History of Present Illness:  Deborah Randall is a 75 y.o. female with recurrent but infrequent paroxysmal atrial fibrillation with rapid ventricular response, initially diagnosed in 2011, returning in follow-up after elective cigarettes cardioversion performed on March 30. She feels much better after return in normal rhythm, with more energy. She has not had any bleeding complications and is compliant with anticoagulation. She denies any interim problems with palpitations, dizziness, syncope, dyspnea or angina. She has mild chronic ankle edema. She has well treated hypertension and hypothyroidism and hypercholesterolemia. She has a remote history of left mastectomy and radiation therapy 15 years ago. She has preserved left ventricular systolic function and a moderately dilated left atrium.   Past Medical History  Diagnosis Date  . Depression   . Hypertension   . Lumbago   . Senile osteoporosis   . Nausea alone   . Atrial fibrillation with rapid ventricular response ([facility]) 07/07/2015  . Bilateral breast cancer ([place])   . Squamous cell carcinoma of forehead     "I think they burned it off"  . Hypothyroidism   . Hyperlipidemia   . Iron deficiency anemia 2000s  . GERD (gastroesophageal reflux disease)   . Daily headache   . Anxiety     Past Surgical History  Procedure Laterality Date  . Total thyroidectomy  1972  . Inguinal hernia repair Right 1980s?  [name] Excisional hemorrhoidectomy  ~ 1970  . Hemiarthroplasty hip Left 2011    "had ball replaced"  . Breast biopsy Bilateral   . Breast lumpectomy Right 2002    "w/lymph nodes  removed"  . Mastectomy Left 2005  . Fracture surgery    . Elbow fracture surgery Left 2011    "has 2 screws in it"  . Wrist fracture surgery Right     "has a plate and 7 screws in there"  . Dilation and curettage of uterus  ~ 1968  . Abdominal hysterectomy  1970s  . Cataract extraction w/ intraocular lens  implant, bilateral Bilateral 2000s  . Cardioversion N/A 08/19/2015    Procedure: CARDIOVERSION;  Surgeon: Sanda Klein, MD;  Location: [facility];  Service: Cardiovascular;  Laterality: N/A;    Current Medications: Outpatient Prescriptions Prior to Visit  Medication Sig Dispense Refill  . acetaminophen (TYLENOL) 325 MG tablet Take 2 tablets (650 mg total) by mouth every 4 (four) hours as needed for headache or mild pain.    [name] alendronate (FOSAMAX) 70 MG tablet Take one tablet by mouth weekly with 8 ounces water, stay upright for 30 minutes after taking, For Osteoporosis 4 tablet 6  . ALPRAZolam (XANAX) 0.5 MG tablet Take 1 tablet (0.5 mg total) by mouth daily. For anxiety 90 tablet 1  . apixaban (ELIQUIS) 5 MG TABS tablet Take 1 tablet (5 mg total) by mouth 2 (two) times daily. 60 tablet 11  . Cholecalciferol (VITAMIN D3) 2000 UNITS TABS Take by mouth daily.    . diphenhydrAMINE (SOMINEX) 25 MG tablet Take 25 mg by mouth at bedtime as needed for  sleep. Take 1 tablets once daily    . Fructose-Dextrose-Phosphor Acd (ANTI-NAUSEA PO) Take 1 tablet by mouth every 8 (eight) hours as needed (nausea).    [name] levothyroxine (SYNTHROID, LEVOTHROID) 137 MCG tablet Take one tablet by mouth 30 minutes before breakfast for thyroid 30 tablet 1  . metoprolol (LOPRESSOR) 50 MG tablet Take 1 tablet (50 mg total) by mouth 2 (two) times daily. 60 tablet 11  . pravastatin (PRAVACHOL) 40 MG tablet Take 1 tablet by mouth every evening with meal 30 tablet 5  . ranitidine (ZANTAC) 75 MG tablet Take 75 mg by mouth 2 (two) times daily as needed (For Heartburn).     . sertraline (ZOLOFT) 25 MG tablet Take 2  tablets (50 mg total) by mouth daily. Take 2 tablets by mouth daily for depression 180 tablet 1  . triamterene-hydrochlorothiazide (MAXZIDE-25) 37.5-25 MG tablet Take 0.5 tablets by mouth daily. 30 tablet 3   No facility-administered medications prior to visit.     Allergies:   Review of patient's allergies indicates no known allergies.   Social History   Social History  . Marital Status: Widowed    Spouse Name: N/A  . Number of Children: N/A  . Years of Education: N/A   Occupational History  . 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 Smoking status: Never Smoker   . Smokeless tobacco: Never Used  . Alcohol Use: No  . Drug Use: No  . Sexual Activity: No   Other Topics Concern  . None   Social History Narrative   Lives alone   Widow   2 sons , one son decease, one son disable    Works at [employer] in [employer]   Enjoys going places with friends   Never smoked   Alcohol none   Exercise -walk, works in yard      Family History:  The patient's family history includes COPD in her brother; Cancer in her father and sister; Diabetes in her brother; Heart attack in her brother; Heart disease in her sister; Hypertension in her sister; Mental illness in her son and son; Other in her brother, brother, and brother.   ROS:   Please see the history of present illness.    ROS All other systems reviewed and are negative.   PHYSICAL EXAM:   VS:  BP 130/72 mmHg  Pulse 57  Ht 5\' 4"  (1.626 m)  Wt 77.111 kg (170 lb)  BMI 29.17 kg/m2   GEN: Well nourished, well developed, in no acute distress HEENT: normal Neck: no JVD, carotid bruits, or masses Cardiac: RRR; no murmurs, rubs, or gallops,no edema  Respiratory:  clear to auscultation bilaterally, normal work of breathing GI: soft, nontender, nondistended, + BS MS: no deformity or atrophy Skin: warm and dry, no rash Neuro:  Alert and Oriented x 3, Strength and sensation are intact Psych:  euthymic mood, full affect  Wt Readings from Last 3 Encounters:  09/13/15 77.111 kg (170 lb)  08/19/15 78.019 kg (172 lb)  08/11/15 78.019 kg (172 lb)      Studies/Labs Reviewed:   EKG:  EKG is ordered today.  The ekg ordered today demonstrates Mild sinus bradycardia, QTC 424 ms  Recent Labs: 08/11/2015: BUN 14; Creat 1.08*; Hemoglobin 13.9; Platelets 186; Potassium 3.9; Sodium 139; TSH 4.63*   Lipid Panel    Component Value Date/Time   CHOL 162 07/08/2015 0535   CHOL 259* 02/19/2015 0949   TRIG 86 07/08/2015  0535   HDL 52 07/08/2015 0535   HDL 53 02/19/2015 0949   CHOLHDL 3.1 07/08/2015 0535   CHOLHDL 4.9* 02/19/2015 0949   VLDL 17 07/08/2015 0535   LDLCALC 93 07/08/2015 0535   LDLCALC 166* 02/19/2015 0949      ASSESSMENT:    1. Paroxysmal atrial fibrillation (HCC)   2. Essential hypertension   3. Hypothyroidism due to acquired atrophy of thyroid   4. Hypercholesterolemia   5. History of radiation therapy status post left lumpectomy      PLAN:  In order of problems listed above:  1. No recurrence of atrial fibrillation following elective cardioversion. Her episodes are infrequent and antiarrhythmic therapy does not appear to be justified at this time. Continued lifelong anticoagulation therapy is recommended (CHADSVasc score 4: age 45, gender, HTN).    Medication Adjustments/Labs and Tests Ordered: Current medicines are reviewed at length with the patient today.  Concerns regarding medicines are outlined above.  Medication changes, Labs and Tests ordered today are listed in the Patient Instructions below. Patient Instructions  Dr Sallyanne Kuster recommends that you continue on your current medications as directed. Please refer to the Current Medication list given to you today.  Dr Sallyanne Kuster recommends that you schedule a follow-up appointment in 6 months. You will receive a reminder letter in the mail two months in advance. If you don't receive a letter, please call our  office to schedule the follow-up appointment.  If you need a refill on your cardiac medications before your next appointment, please call your pharmacy.     Mikael Spray, MD  09/13/2015 11:09 AM    [facility] [place], [place], [place]  [address] Phone: [phone]; Fax: [phone]

## 2015-09-13 NOTE — Patient Instructions (Signed)
Dr Sallyanne Kuster recommends that you continue on your current medications as directed. Please refer to the Current Medication list given to you today.  Dr Sallyanne Kuster recommends that you schedule a follow-up appointment in 6 months. You will receive a reminder letter in the mail two months in advance. If you don't receive a letter, please call our office to schedule the follow-up appointment.  If you need a refill on your cardiac medications before your next appointment, please call your pharmacy.

## 2015-10-06 ENCOUNTER — Other Ambulatory Visit: Payer: Self-pay

## 2015-10-06 ENCOUNTER — Telehealth: Payer: Self-pay

## 2015-10-06 DIAGNOSIS — E038 Other specified hypothyroidism: Secondary | ICD-10-CM

## 2015-10-06 MED ORDER — LEVOTHYROXINE SODIUM 137 MCG PO TABS: ORAL_TABLET | ORAL | Status: DC

## 2015-10-06 NOTE — Telephone Encounter (Signed)
I called the patient to let her know that I have attempted to fax a prescription for levothyroxine 137 mcg tablets to [facility] several times. Each time the fax is returned as non-deliverable. I tried to call the pharmacy but all I got was a recording saying the call could not be completed.

## 2015-10-06 NOTE — Telephone Encounter (Signed)
Refill for levothyroxine 137 mcg #30 was sent electronically to pharmacy. A note was included on the Rx that patient needed an appointment for any additional refills.   Last OV: 02/19/2015 No pending appointments.  Rx was sent to [facility], [address], [place] [address]. Phone: [phone]  Fax: [phone]

## 2015-10-06 NOTE — Addendum Note (Signed)
Addended by: Denyse Amass on: 10/06/2015 03:15 PM   Modules accepted: Orders

## 2015-10-06 NOTE — Telephone Encounter (Signed)
Resent Rx due to transmission failure of first Rx.  levothyroxine (SYNTHROID, LEVOTHROID) 137 MCG tablet 30 tablet 0    Start: 10/06/2015   Sig: Patient needs a appointment for any additional refills. Take one tablet by mouth 30 minutes before breakfast for thyroid   E-Prescribing Status: Transmission to pharmacy failed (10/06/2015 3:10 PM EDT)

## 2015-10-07 ENCOUNTER — Other Ambulatory Visit: Payer: Self-pay | Admitting: *Deleted

## 2015-10-07 DIAGNOSIS — E038 Other specified hypothyroidism: Secondary | ICD-10-CM

## 2015-10-07 MED ORDER — LEVOTHYROXINE SODIUM 137 MCG PO TABS: ORAL_TABLET | ORAL | Status: DC

## 2015-10-07 NOTE — Telephone Encounter (Signed)
Resubmitted Rx for Levothyroxine to [facility]

## 2015-11-05 ENCOUNTER — Other Ambulatory Visit: Payer: Self-pay | Admitting: *Deleted

## 2015-11-05 DIAGNOSIS — E038 Other specified hypothyroidism: Secondary | ICD-10-CM

## 2015-11-05 MED ORDER — LEVOTHYROXINE SODIUM 137 MCG PO TABS: ORAL_TABLET | ORAL | Status: DC

## 2015-11-05 NOTE — Telephone Encounter (Signed)
[facility] 

## 2015-11-22 ENCOUNTER — Encounter: Payer: Self-pay | Admitting: Internal Medicine

## 2015-11-22 ENCOUNTER — Ambulatory Visit (INDEPENDENT_AMBULATORY_CARE_PROVIDER_SITE_OTHER): Payer: Commercial Managed Care - HMO | Admitting: Internal Medicine

## 2015-11-22 VITALS — BP 138/80 | HR 53 | Temp 98.1°F | Wt 173.0 lb

## 2015-11-22 DIAGNOSIS — F329 Major depressive disorder, single episode, unspecified: Secondary | ICD-10-CM | POA: Diagnosis not present

## 2015-11-22 DIAGNOSIS — I4891 Unspecified atrial fibrillation: Secondary | ICD-10-CM | POA: Diagnosis not present

## 2015-11-22 DIAGNOSIS — E785 Hyperlipidemia, unspecified: Secondary | ICD-10-CM | POA: Diagnosis not present

## 2015-11-22 DIAGNOSIS — N183 Chronic kidney disease, stage 3 unspecified: Secondary | ICD-10-CM

## 2015-11-22 DIAGNOSIS — N189 Chronic kidney disease, unspecified: Secondary | ICD-10-CM | POA: Diagnosis not present

## 2015-11-22 DIAGNOSIS — Z853 Personal history of malignant neoplasm of breast: Secondary | ICD-10-CM | POA: Diagnosis not present

## 2015-11-22 DIAGNOSIS — I10 Essential (primary) hypertension: Secondary | ICD-10-CM

## 2015-11-22 DIAGNOSIS — Z7901 Long term (current) use of anticoagulants: Secondary | ICD-10-CM

## 2015-11-22 DIAGNOSIS — N2889 Other specified disorders of kidney and ureter: Secondary | ICD-10-CM

## 2015-11-22 DIAGNOSIS — E039 Hypothyroidism, unspecified: Secondary | ICD-10-CM | POA: Diagnosis not present

## 2015-11-22 DIAGNOSIS — M810 Age-related osteoporosis without current pathological fracture: Secondary | ICD-10-CM | POA: Diagnosis not present

## 2015-11-22 DIAGNOSIS — R079 Chest pain, unspecified: Secondary | ICD-10-CM

## 2015-11-22 DIAGNOSIS — F32A Depression, unspecified: Secondary | ICD-10-CM

## 2015-11-22 NOTE — Progress Notes (Signed)
Location:  [facility] clinic Provider:  Khari Mally L. Mariea Randall, D.O., C.M.D.  Code Status: prior discussions requested DNR, but listed as full code during hospitalization Goals of Care:  Advanced Directives 11/22/2015  Does patient have an advance directive? No  Type of Advance Directive -  Does patient want to make changes to advanced directive? -  Copy of advanced directive(s) in chart? -    Chief Complaint  Patient presents with  . Medical Management of Chronic Issues    62mth follow-up    HPI: Patient is a 75 y.o. female seen today for medical management of chronic diseases.   She has not been seen for 10 mos rather than 6 mos.  During that time, she was hospitalized for afib with RVR and elevated troponin in Feb.  She had hypotension and her lisinopril was stopped.   Beta blocker was added for rate control and NOAC was started.  She then had a stress myoview and outpatient cardioversion 08/11/15.  She is following with Dr. Sallyanne Randall.    No problems since in reference to her heart rhythm.  No dizziness or lightheadedness.  Hypothyroidism:  TSH in Feb was fine, but up slightly in March.  Depression:  Mood is fine.  Her son is still sick, but she is taking it day by day.  Still on zoloft.  [employer] work again 7 days per week.  She is sleeping well.    No pain complaints.    Does not use the zantac very often for GERD.    Osteoporosis:  On fosamax and vitamin D.  Has not been for her bone density yet.  Wasn't able to go to her last appt.  Past Medical History  Diagnosis Date  . Depression   . Hypertension   . Lumbago   . Senile osteoporosis   . Nausea alone   . Atrial fibrillation with rapid ventricular response ([facility]or[facility]) 07/07/2015  . Bilateral breast cancer (Deborah Randall)   . Squamous cell carcinoma of forehead     "I think they burned it off"  . Hypothyroidism   . Hyperlipidemia   . Iron deficiency anemia 2000s  . GERD (gastroesophageal reflux disease)   . Daily headache   . Anxiety      Past Surgical History  Procedure Laterality Date  . Total thyroidectomy  1972  . Inguinal hernia repair Right 1980s?  [name] Excisional hemorrhoidectomy  ~ 1970  . Hemiarthroplasty hip Left 2011    "had ball replaced"  . Breast biopsy Bilateral   . Breast lumpectomy Right 2002    "w/lymph nodes removed"  . Mastectomy Left 2005  . Fracture surgery    . Elbow fracture surgery Left 2011    "has 2 screws in it"  . Wrist fracture surgery Right     "has a plate and 7 screws in there"  . Dilation and curettage of uterus  ~ 1968  . Abdominal hysterectomy  1970s  . Cataract extraction w/ intraocular lens  implant, bilateral Bilateral 2000s  . Cardioversion N/A 08/19/2015    Procedure: CARDIOVERSION;  Surgeon: Sanda Klein, MD;  Location: [facility];  Service: Cardiovascular;  Laterality: N/A;    No Known Allergies    Medication List       This list is accurate as of: 11/22/15  8:34 AM.  Always use your most recent med list.               acetaminophen 325 MG tablet  Commonly known as:  TYLENOL  Take 2 tablets (650 mg total) by mouth every 4 (four) hours as needed for headache or mild pain.     alendronate 70 MG tablet  Commonly known as:  FOSAMAX  Take one tablet by mouth weekly with 8 ounces water, stay upright for 30 minutes after taking, For Osteoporosis     ALPRAZolam 0.5 MG tablet  Commonly known as:  XANAX  Take 1 tablet (0.5 mg total) by mouth daily. For anxiety     ANTI-NAUSEA PO  Take 1 tablet by mouth every 8 (eight) hours as needed (nausea).     apixaban 5 MG Tabs tablet  Commonly known as:  ELIQUIS  Take 1 tablet (5 mg total) by mouth 2 (two) times daily.     diphenhydrAMINE 25 MG tablet  Commonly known as:  SOMINEX  Take 25 mg by mouth at bedtime as needed for sleep. Take 1 tablets once daily     levothyroxine 137 MCG tablet  Commonly known as:  SYNTHROID, LEVOTHROID  Take one tablet by mouth 30 minutes before breakfast once daily for thyroid      metoprolol 50 MG tablet  Commonly known as:  LOPRESSOR  Take 1 tablet (50 mg total) by mouth 2 (two) times daily.     pravastatin 40 MG tablet  Commonly known as:  PRAVACHOL  Take 1 tablet by mouth every evening with meal     ranitidine 75 MG tablet  Commonly known as:  ZANTAC  Take 75 mg by mouth 2 (two) times daily as needed (For Heartburn).     sertraline 25 MG tablet  Commonly known as:  ZOLOFT  Take 2 tablets (50 mg total) by mouth daily. Take 2 tablets by mouth daily for depression     triamterene-hydrochlorothiazide 37.5-25 MG tablet  Commonly known as:  MAXZIDE-25  Take 0.5 tablets by mouth daily.     Vitamin D3 2000 units Tabs  Take by mouth daily.        Review of Systems:  Review of Systems  Constitutional: Negative for fever, chills and malaise/fatigue.  HENT: Positive for hearing loss. Negative for congestion.   Eyes: Negative for blurred vision.  Respiratory: Negative for cough and shortness of breath.        No further sob since afib with RVR spell  Cardiovascular: Positive for leg swelling. Negative for chest pain and palpitations.       No further chest pain since initial spell with afib with RVR in feb  Gastrointestinal: Negative for abdominal pain and constipation.  Genitourinary: Negative for dysuria.  Musculoskeletal: Negative for myalgias, back pain, joint pain and falls.  Skin: Negative for rash.  Neurological: Negative for dizziness, loss of consciousness, weakness and headaches.  Endo/Heme/Allergies: Bruises/bleeds easily.  Psychiatric/Behavioral: Positive for depression. Negative for memory loss.       Stable these days, son chronically ill    Health Maintenance  Topic Date Due  . INFLUENZA VACCINE  12/21/2015  . TETANUS/TDAP  01/24/2020  . DEXA SCAN  Completed  . ZOSTAVAX  Completed  . PNA vac Low Risk Adult  Completed    Physical Exam: Filed Vitals:   11/22/15 0827  BP: 138/80  Pulse: 53  Temp: 98.1 F (36.7 C)  TempSrc: Oral    Weight: 173 lb (78.472 kg)  SpO2: 94%   Body mass index is 29.68 kg/(m^2). Physical Exam  Constitutional: She is oriented to person, place, and time. She appears well-developed and well-nourished. No distress.  HENT:  Head: Normocephalic  and atraumatic.  Eyes:  glasses  Cardiovascular: Normal rate, regular rhythm, normal heart sounds and intact distal pulses.   Pulmonary/Chest: Effort normal and breath sounds normal. No respiratory distress.  Abdominal: Bowel sounds are normal. She exhibits no distension.  Musculoskeletal: Normal range of motion.  Neurological: She is alert and oriented to person, place, and time.  Skin: Skin is warm and dry.  Psychiatric:  Flat affect    Labs reviewed: Basic Metabolic Panel:  Recent Labs  02/19/15 0949 07/07/15 0811 07/08/15 0535 08/11/15 1156  NA 136 137  --  139  K 4.2 3.8  --  3.9  CL 95* 103  --  101  CO2 24 23  --  29  GLUCOSE 86 131*  --  118*  BUN 12 14  --  14  CREATININE 1.08* 1.19*  --  1.08*  CALCIUM 9.4 9.0  --  8.9  TSH 3.330  --  3.337 4.63*   Liver Function Tests: No results for input(s): AST, ALT, ALKPHOS, BILITOT, PROT, ALBUMIN in the last 8760 hours. No results for input(s): LIPASE, AMYLASE in the last 8760 hours. No results for input(s): AMMONIA in the last 8760 hours. CBC:  Recent Labs  07/07/15 0811 08/11/15 1156  WBC 11.2* 7.8  HGB 14.2 13.9  HCT 44.8 43.1  MCV 88.7 86.0  PLT 159 186   Lipid Panel:  Recent Labs  02/19/15 0949 07/08/15 0535  CHOL 259* 162  HDL 53 52  LDLCALC 166* 93  TRIG 199* 86  CHOLHDL 4.9* 3.1   Lab Results  Component Value Date   HGBA1C 5.9* 02/19/2015    Procedures since last visit: Reviewed stress myoview, hospital records from Feb, cardioversion from Mar, Dr. Lurline Del note  Assessment/Plan 1. Atrial fibrillation with rapid ventricular response (HCC) - one episode that rate improved w/ addition of beta blocker -underwent cardioversion in March - cont eliquis 5mg   po bid, monitor renal function  - Comprehensive metabolic panel  2. Essential hypertension, benign - bp at goal, cont maxzide, lopressor - Comprehensive metabolic panel  3. Hypothyroidism, unspecified hypothyroidism type -cont synthroid 152mcg for now, await tsh results b/c last was trending up again - TSH  4. Senile osteoporosis - cont fosamax and vitamin D - was to f/u bone density but did not go--advised to reschedule it  5. HX: breast cancer -no recurrence and refuses f/u imaging at this point  6. Hyperlipidemia with target LDL less than 100 - cont statin therapy with pravachol 40mg  (could not afford or tolerate more potent statins) - Hemoglobin A1c - Lipid panel  7. Depression -cont zoloft and daily xanax due to stress/depression from her son's chronic illness  8. Chronic anticoagulation - cont eliquis for afib - CBC with Differential/Platelet  9. Chronic renal insufficiency, stage III (moderate) - monitor renal function, is on maxzide - Comprehensive metabolic panel  10. Chest pain with moderate risk of acute coronary syndrome -one episode in context of afib with RVR--reports it felt like her lung collapsed  Labs/tests ordered:   Orders Placed This Encounter  Procedures  . CBC with Differential/Platelet  . Comprehensive metabolic panel    Order Specific Question:  Has the patient fasted?    Answer:  Yes  . Hemoglobin A1c  . Lipid panel    Order Specific Question:  Has the patient fasted?    Answer:  Yes  . TSH    Next appt:  03/02/2016 for annual wellness, NO LABS  Margarita Croke L. Kanani Mowbray, D.O. Geriatrics  [facility] [address], [place] [address] Cell Phone (Mon-Fri 8am-5pm):  [phone] On Call:  [phone] & follow prompts after 5pm & weekends Office Phone:  [phone] Office Fax:  [phone]

## 2015-11-22 NOTE — Patient Instructions (Signed)
Please reschedule your bone density.

## 2015-11-23 LAB — CBC WITH DIFFERENTIAL/PLATELET
Basophils Absolute: 0.1 10*3/uL (ref 0.0–0.2)
Basos: 1 %
EOS (ABSOLUTE): 0.3 10*3/uL (ref 0.0–0.4)
Eos: 4 %
Hematocrit: 41.2 % (ref 34.0–46.6)
Hemoglobin: 13.1 g/dL (ref 11.1–15.9)
Immature Grans (Abs): 0 10*3/uL (ref 0.0–0.1)
Immature Granulocytes: 0 %
Lymphocytes Absolute: 2.7 10*3/uL (ref 0.7–3.1)
Lymphs: 40 %
MCH: 27.2 pg (ref 26.6–33.0)
MCHC: 31.8 g/dL (ref 31.5–35.7)
MCV: 86 fL (ref 79–97)
Monocytes Absolute: 0.6 10*3/uL (ref 0.1–0.9)
Monocytes: 9 %
Neutrophils Absolute: 3.2 10*3/uL (ref 1.4–7.0)
Neutrophils: 46 %
Platelets: 153 10*3/uL (ref 150–379)
RBC: 4.81 x10E6/uL (ref 3.77–5.28)
RDW: 14.6 % (ref 12.3–15.4)
WBC: 6.8 10*3/uL (ref 3.4–10.8)

## 2015-11-23 LAB — LIPID PANEL
Chol/HDL Ratio: 3.4 ratio units (ref 0.0–4.4)
Cholesterol, Total: 155 mg/dL (ref 100–199)
HDL: 46 mg/dL (ref >39)
LDL Calculated: 85 mg/dL (ref 0–99)
Triglycerides: 122 mg/dL (ref 0–149)
VLDL Cholesterol Cal: 24 mg/dL (ref 5–40)

## 2015-11-23 LAB — COMPREHENSIVE METABOLIC PANEL
ALT: 11 IU/L (ref 0–32)
AST: 18 IU/L (ref 0–40)
Albumin/Globulin Ratio: 1.8 (ref 1.2–2.2)
Albumin: 3.9 g/dL (ref 3.5–4.8)
Alkaline Phosphatase: 59 IU/L (ref 39–117)
BUN/Creatinine Ratio: 21 (ref 12–28)
BUN: 22 mg/dL (ref 8–27)
Bilirubin Total: 0.5 mg/dL (ref 0.0–1.2)
CO2: 26 mmol/L (ref 18–29)
Calcium: 8.8 mg/dL (ref 8.7–10.3)
Chloride: 101 mmol/L (ref 96–106)
Creatinine, Ser: 1.07 mg/dL — ABNORMAL HIGH (ref 0.57–1.00)
GFR calc Af Amer: 58 mL/min/{1.73_m2} — ABNORMAL LOW (ref >59)
GFR calc non Af Amer: 51 mL/min/{1.73_m2} — ABNORMAL LOW (ref >59)
Globulin, Total: 2.2 g/dL (ref 1.5–4.5)
Glucose: 86 mg/dL (ref 65–99)
Potassium: 4.4 mmol/L (ref 3.5–5.2)
Sodium: 142 mmol/L (ref 134–144)
Total Protein: 6.1 g/dL (ref 6.0–8.5)

## 2015-11-23 LAB — HEMOGLOBIN A1C
Est. average glucose Bld gHb Est-mCnc: 120 mg/dL
Hgb A1c MFr Bld: 5.8 % — ABNORMAL HIGH (ref 4.8–5.6)

## 2015-11-23 LAB — TSH: TSH: 3.58 u[IU]/mL (ref 0.450–4.500)

## 2015-11-25 ENCOUNTER — Encounter: Payer: Self-pay | Admitting: *Deleted

## 2015-11-25 ENCOUNTER — Other Ambulatory Visit: Payer: Self-pay | Admitting: *Deleted

## 2015-11-25 MED ORDER — PRAVASTATIN SODIUM 40 MG PO TABS: ORAL_TABLET | ORAL | Status: DC

## 2015-12-31 ENCOUNTER — Other Ambulatory Visit: Payer: Self-pay | Admitting: *Deleted

## 2015-12-31 MED ORDER — ALPRAZOLAM 0.5 MG PO TABS: 0.5000 mg | ORAL_TABLET | Freq: Every day | ORAL | 1 refills | Status: DC

## 2015-12-31 NOTE — Telephone Encounter (Signed)
[facility] 

## 2016-01-05 ENCOUNTER — Other Ambulatory Visit: Payer: Self-pay | Admitting: *Deleted

## 2016-01-05 DIAGNOSIS — E038 Other specified hypothyroidism: Secondary | ICD-10-CM

## 2016-01-05 MED ORDER — LEVOTHYROXINE SODIUM 137 MCG PO TABS: ORAL_TABLET | ORAL | 1 refills | Status: DC

## 2016-01-05 NOTE — Telephone Encounter (Signed)
[facility] 

## 2016-01-05 NOTE — Addendum Note (Signed)
Addended by: Rafael Bihari A on: 01/05/2016 02:47 PM   Modules accepted: Orders

## 2016-01-17 ENCOUNTER — Telehealth: Payer: Self-pay | Admitting: *Deleted

## 2016-01-17 DIAGNOSIS — L989 Disorder of the skin and subcutaneous tissue, unspecified: Secondary | ICD-10-CM

## 2016-01-17 NOTE — Telephone Encounter (Signed)
Deborah [facility] with [facility] called requesting a referral for patient. She has an appointment on 01/19/16 with Dr. Amy Martinique for ICD L98.9 and needs a [facility] Referral. [employer] [id number]. Please Advise.

## 2016-02-11 ENCOUNTER — Encounter (HOSPITAL_COMMUNITY): Payer: Self-pay | Admitting: Emergency Medicine

## 2016-02-11 ENCOUNTER — Emergency Department (HOSPITAL_COMMUNITY): Payer: Commercial Managed Care - HMO

## 2016-02-11 ENCOUNTER — Observation Stay (HOSPITAL_COMMUNITY)
Admission: EM | Admit: 2016-02-11 | Discharge: 2016-02-12 | Disposition: A | Discharge status: Home / Self Care | Payer: Commercial Managed Care - HMO | Attending: Family Medicine | Admitting: Family Medicine

## 2016-02-11 DIAGNOSIS — N183 Chronic kidney disease, stage 3 unspecified: Secondary | ICD-10-CM | POA: Diagnosis present

## 2016-02-11 DIAGNOSIS — S59292A Other physeal fracture of lower end of radius, left arm, initial encounter for closed fracture: Secondary | ICD-10-CM | POA: Diagnosis not present

## 2016-02-11 DIAGNOSIS — F419 Anxiety disorder, unspecified: Secondary | ICD-10-CM | POA: Diagnosis not present

## 2016-02-11 DIAGNOSIS — Y9389 Activity, other specified: Secondary | ICD-10-CM | POA: Insufficient documentation

## 2016-02-11 DIAGNOSIS — K219 Gastro-esophageal reflux disease without esophagitis: Secondary | ICD-10-CM | POA: Diagnosis not present

## 2016-02-11 DIAGNOSIS — Z90710 Acquired absence of both cervix and uterus: Secondary | ICD-10-CM | POA: Insufficient documentation

## 2016-02-11 DIAGNOSIS — I480 Paroxysmal atrial fibrillation: Secondary | ICD-10-CM | POA: Diagnosis not present

## 2016-02-11 DIAGNOSIS — S52512A Displaced fracture of left radial styloid process, initial encounter for closed fracture: Secondary | ICD-10-CM | POA: Diagnosis not present

## 2016-02-11 DIAGNOSIS — Z853 Personal history of malignant neoplasm of breast: Secondary | ICD-10-CM | POA: Insufficient documentation

## 2016-02-11 DIAGNOSIS — E785 Hyperlipidemia, unspecified: Secondary | ICD-10-CM | POA: Diagnosis not present

## 2016-02-11 DIAGNOSIS — I517 Cardiomegaly: Secondary | ICD-10-CM | POA: Diagnosis not present

## 2016-02-11 DIAGNOSIS — F329 Major depressive disorder, single episode, unspecified: Secondary | ICD-10-CM | POA: Insufficient documentation

## 2016-02-11 DIAGNOSIS — S52532A Colles' fracture of left radius, initial encounter for closed fracture: Secondary | ICD-10-CM | POA: Diagnosis not present

## 2016-02-11 DIAGNOSIS — M810 Age-related osteoporosis without current pathological fracture: Secondary | ICD-10-CM | POA: Insufficient documentation

## 2016-02-11 DIAGNOSIS — I481 Persistent atrial fibrillation: Secondary | ICD-10-CM | POA: Diagnosis not present

## 2016-02-11 DIAGNOSIS — S52502A Unspecified fracture of the lower end of left radius, initial encounter for closed fracture: Secondary | ICD-10-CM | POA: Diagnosis present

## 2016-02-11 DIAGNOSIS — S52612A Displaced fracture of left ulna styloid process, initial encounter for closed fracture: Secondary | ICD-10-CM | POA: Diagnosis not present

## 2016-02-11 DIAGNOSIS — Y998 Other external cause status: Secondary | ICD-10-CM | POA: Insufficient documentation

## 2016-02-11 DIAGNOSIS — W108XXA Fall (on) (from) other stairs and steps, initial encounter: Secondary | ICD-10-CM | POA: Insufficient documentation

## 2016-02-11 DIAGNOSIS — I10 Essential (primary) hypertension: Secondary | ICD-10-CM | POA: Insufficient documentation

## 2016-02-11 DIAGNOSIS — E039 Hypothyroidism, unspecified: Secondary | ICD-10-CM | POA: Insufficient documentation

## 2016-02-11 DIAGNOSIS — Z85828 Personal history of other malignant neoplasm of skin: Secondary | ICD-10-CM | POA: Insufficient documentation

## 2016-02-11 DIAGNOSIS — S52622A Torus fracture of lower end of left ulna, initial encounter for closed fracture: Secondary | ICD-10-CM | POA: Diagnosis not present

## 2016-02-11 DIAGNOSIS — R52 Pain, unspecified: Secondary | ICD-10-CM

## 2016-02-11 DIAGNOSIS — Z01818 Encounter for other preprocedural examination: Secondary | ICD-10-CM | POA: Diagnosis not present

## 2016-02-11 DIAGNOSIS — S52322A Displaced transverse fracture of shaft of left radius, initial encounter for closed fracture: Secondary | ICD-10-CM | POA: Diagnosis not present

## 2016-02-11 DIAGNOSIS — Z7901 Long term (current) use of anticoagulants: Secondary | ICD-10-CM | POA: Insufficient documentation

## 2016-02-11 DIAGNOSIS — Y9289 Other specified places as the place of occurrence of the external cause: Secondary | ICD-10-CM | POA: Diagnosis not present

## 2016-02-11 DIAGNOSIS — M25532 Pain in left wrist: Secondary | ICD-10-CM | POA: Diagnosis present

## 2016-02-11 LAB — CBC
HCT: 40.2 % (ref 36.0–46.0)
HEMOGLOBIN: 12.8 g/dL (ref 12.0–15.0)
MCH: 27.8 pg (ref 26.0–34.0)
MCHC: 31.8 g/dL (ref 30.0–36.0)
MCV: 87.2 fL (ref 78.0–100.0)
Platelets: 163 10*3/uL (ref 150–400)
RBC: 4.61 MIL/uL (ref 3.87–5.11)
RDW: 13.1 % (ref 11.5–15.5)
WBC: 10.8 10*3/uL — ABNORMAL HIGH (ref 4.0–10.5)

## 2016-02-11 LAB — BASIC METABOLIC PANEL
Anion gap: 10 (ref 5–15)
BUN: 16 mg/dL (ref 6–20)
CHLORIDE: 100 mmol/L — AB (ref 101–111)
CO2: 24 mmol/L (ref 22–32)
Calcium: 8.9 mg/dL (ref 8.9–10.3)
Creatinine, Ser: 1.01 mg/dL — ABNORMAL HIGH (ref 0.44–1.00)
GFR calc Af Amer: >60 mL/min (ref >60)
GFR calc non Af Amer: 53 mL/min — ABNORMAL LOW (ref >60)
GLUCOSE: 115 mg/dL — AB (ref 65–99)
POTASSIUM: 3.7 mmol/L (ref 3.5–5.1)
Sodium: 134 mmol/L — ABNORMAL LOW (ref 135–145)

## 2016-02-11 LAB — SURGICAL PCR SCREEN
MRSA, PCR: NEGATIVE
STAPHYLOCOCCUS AUREUS: NEGATIVE

## 2016-02-11 MED ORDER — ONDANSETRON HCL 4 MG/2ML IJ SOLN
4.0000 mg | Freq: Once | INTRAMUSCULAR | Status: AC
  Administered 2016-02-11: 4 mg via INTRAVENOUS
  Filled 2016-02-11: qty 2

## 2016-02-11 MED ORDER — FENTANYL CITRATE (PF) 100 MCG/2ML IJ SOLN
50.0000 ug | Freq: Once | INTRAMUSCULAR | Status: AC
  Administered 2016-02-11: 50 ug via INTRAVENOUS
  Filled 2016-02-11: qty 2

## 2016-02-11 MED ORDER — ALENDRONATE SODIUM 70 MG PO TABS: 70.0000 mg | ORAL_TABLET | ORAL | Status: DC

## 2016-02-11 MED ORDER — MORPHINE SULFATE (PF) 4 MG/ML IV SOLN
4.0000 mg | INTRAVENOUS | Status: DC | PRN
  Administered 2016-02-11 – 2016-02-12 (×2): 4 mg via INTRAVENOUS
  Filled 2016-02-11 (×2): qty 1

## 2016-02-11 MED ORDER — BISACODYL 5 MG PO TBEC: 5.0000 mg | DELAYED_RELEASE_TABLET | Freq: Every day | ORAL | Status: DC | PRN

## 2016-02-11 MED ORDER — ACETAMINOPHEN 325 MG PO TABS: 650.0000 mg | ORAL_TABLET | Freq: Four times a day (QID) | ORAL | Status: DC | PRN

## 2016-02-11 MED ORDER — DIPHENHYDRAMINE HCL (SLEEP) 25 MG PO TABS: 25.0000 mg | ORAL_TABLET | Freq: Every evening | ORAL | Status: DC | PRN

## 2016-02-11 MED ORDER — VITAMIN D 1000 UNITS PO TABS
2000.0000 [IU] | ORAL_TABLET | Freq: Every day | ORAL | Status: DC
  Administered 2016-02-12: 2000 [IU] via ORAL
  Filled 2016-02-11: qty 2

## 2016-02-11 MED ORDER — LEVOTHYROXINE SODIUM 25 MCG PO TABS
137.0000 ug | ORAL_TABLET | Freq: Every day | ORAL | Status: DC
  Filled 2016-02-11: qty 1

## 2016-02-11 MED ORDER — INFLUENZA VAC SPLIT QUAD 0.5 ML IM SUSY: 0.5000 mL | PREFILLED_SYRINGE | INTRAMUSCULAR | Status: DC

## 2016-02-11 MED ORDER — PRAVASTATIN SODIUM 40 MG PO TABS
40.0000 mg | ORAL_TABLET | Freq: Every day | ORAL | Status: DC
  Administered 2016-02-11: 40 mg via ORAL
  Filled 2016-02-11: qty 1

## 2016-02-11 MED ORDER — POLYETHYLENE GLYCOL 3350 17 G PO PACK: 17.0000 g | PACK | Freq: Every day | ORAL | Status: DC | PRN

## 2016-02-11 MED ORDER — SERTRALINE HCL 50 MG PO TABS
50.0000 mg | ORAL_TABLET | Freq: Every day | ORAL | Status: DC
  Administered 2016-02-11 – 2016-02-12 (×2): 50 mg via ORAL
  Filled 2016-02-11 (×2): qty 1

## 2016-02-11 MED ORDER — METOPROLOL TARTRATE 50 MG PO TABS
50.0000 mg | ORAL_TABLET | Freq: Two times a day (BID) | ORAL | Status: DC
  Administered 2016-02-11 – 2016-02-12 (×3): 50 mg via ORAL
  Filled 2016-02-11 (×2): qty 1
  Filled 2016-02-11: qty 2

## 2016-02-11 MED ORDER — FAMOTIDINE 20 MG PO TABS: 20.0000 mg | ORAL_TABLET | Freq: Two times a day (BID) | ORAL | Status: DC | PRN

## 2016-02-11 MED ORDER — TRAZODONE HCL 50 MG PO TABS: 25.0000 mg | ORAL_TABLET | Freq: Every evening | ORAL | Status: DC | PRN

## 2016-02-11 MED ORDER — FENTANYL CITRATE (PF) 100 MCG/2ML IJ SOLN
100.0000 ug | Freq: Once | INTRAMUSCULAR | Status: AC
  Administered 2016-02-11: 100 ug via INTRAVENOUS
  Filled 2016-02-11: qty 2

## 2016-02-11 MED ORDER — ONDANSETRON HCL 4 MG/2ML IJ SOLN: 4.0000 mg | Freq: Four times a day (QID) | INTRAMUSCULAR | Status: DC | PRN

## 2016-02-11 MED ORDER — ZOLPIDEM TARTRATE 5 MG PO TABS: 5.0000 mg | ORAL_TABLET | Freq: Every evening | ORAL | Status: DC | PRN

## 2016-02-11 MED ORDER — TRIAMTERENE-HCTZ 37.5-25 MG PO TABS
0.5000 | ORAL_TABLET | Freq: Every day | ORAL | Status: DC
  Administered 2016-02-12: 0.5 via ORAL
  Filled 2016-02-11: qty 0.5

## 2016-02-11 MED ORDER — TRIAMTERENE-HCTZ 37.5-25 MG PO TABS
0.5000 | ORAL_TABLET | Freq: Every day | ORAL | Status: DC
  Administered 2016-02-11: 0.5 via ORAL
  Filled 2016-02-11: qty 1

## 2016-02-11 MED ORDER — MORPHINE SULFATE (PF) 2 MG/ML IV SOLN
2.0000 mg | INTRAVENOUS | Status: DC | PRN
  Administered 2016-02-11: 2 mg via INTRAVENOUS
  Filled 2016-02-11: qty 1

## 2016-02-11 MED ORDER — ACETAMINOPHEN 650 MG RE SUPP: 650.0000 mg | Freq: Four times a day (QID) | RECTAL | Status: DC | PRN

## 2016-02-11 MED ORDER — ALPRAZOLAM 0.5 MG PO TABS
0.5000 mg | ORAL_TABLET | Freq: Every day | ORAL | Status: DC
  Administered 2016-02-11 – 2016-02-12 (×2): 0.5 mg via ORAL
  Filled 2016-02-11: qty 1
  Filled 2016-02-11: qty 2

## 2016-02-11 MED ORDER — ONDANSETRON HCL 4 MG PO TABS: 4.0000 mg | ORAL_TABLET | Freq: Four times a day (QID) | ORAL | Status: DC | PRN

## 2016-02-11 NOTE — H&P (Signed)
History and Physical    Deborah Randall [id number] DOB: 1940-07-19 DOA: 02/11/2016  PCP: Hollace Kinnier, DO  Patient coming from:   Home   Chief Complaint:  Fall at home last night.   HPI: Deborah Randall is a 75 y.o. female with a medical history significant for, but not  limited to,  atrial fibrillation, depression, hypertension, hyperlipidemia, and GERD. Patient lives alone. She missed a step in home last evening and fell landing on her left wrist. Patient took tylenol for pain during the night then called a longtime friend this am to bring her to ED.  No syncope or dizziness, just lost balance leading to fall. Patient otherwise felt and continues to feel okay. No chest pain, sob or other complaints.    ED Course:  Afebrile, BP mildly elevated, HR 50's, normal 02 sats.  WBC 10.8, cbc o/w unremarkable.  chemistries unremarkable  Review of Systems: As per HPI, otherwise 10 point review of systems negative.    Past Medical History:  Diagnosis Date  . Anxiety   . Atrial fibrillation with rapid ventricular response ([place]) 07/07/2015  . Bilateral breast cancer ([place])   . Daily headache   . Depression   . GERD (gastroesophageal reflux disease)   . Hyperlipidemia   . Hypertension   . Hypothyroidism   . Iron deficiency anemia 2000s  . Lumbago   . Nausea alone   . Senile osteoporosis   . Squamous cell carcinoma of forehead    "I think they burned it off"    Past Surgical History:  Procedure Laterality Date  . ABDOMINAL HYSTERECTOMY  1970s  . BREAST BIOPSY Bilateral   . BREAST LUMPECTOMY Right 2002   "w/lymph nodes removed"  . CARDIOVERSION N/A 08/19/2015   Procedure: CARDIOVERSION;  Surgeon: Sanda Klein, MD;  Location: [facility];  Service: Cardiovascular;  Laterality: N/A;  . CATARACT EXTRACTION W/ INTRAOCULAR LENS  IMPLANT, BILATERAL Bilateral 2000s  . DILATION AND CURETTAGE OF UTERUS  ~ 1968  . ELBOW FRACTURE SURGERY Left 2011   "has 2 screws in it"  . EXCISIONAL  HEMORRHOIDECTOMY  ~ 1970  . FRACTURE SURGERY    . HEMIARTHROPLASTY HIP Left 2011   "had ball replaced"  . INGUINAL HERNIA REPAIR Right 1980s?  [name] MASTECTOMY Left 2005  . TOTAL THYROIDECTOMY  1972  . WRIST FRACTURE SURGERY Right    "has a plate and 7 screws in there"    Social History   Social History  . Marital status: Widowed    Spouse name: N/A  . Number of children: N/A  . Years of education: N/A   Occupational History  . 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 Smoking status: Never Smoker  . Smokeless tobacco: Never Used  . Alcohol use No  . Drug use: No  . Sexual activity: No   Other Topics Concern  . Not on file   Social History Narrative   Lives alone   Widow   2 sons , one son decease, one son disable    Works at [employer] in [employer]   Enjoys going places with friends   Never smoked   Alcohol none   Exercise -walk, works in yard   Patient lives alone. No assistive devices needed for ambulation  No Known Allergies  Family History  Problem Relation Age of Onset  . Cancer Father   . Heart attack Brother   . Mental illness Son   .  Other Brother   . Other Brother   . Cancer Sister   . Heart disease Sister   . Hypertension Sister   . Other Brother   . Diabetes Brother   . COPD Brother   . Mental illness Son     Prior to Admission medications   Medication Sig Start Date End Date Taking? Authorizing Provider  acetaminophen (TYLENOL) 325 MG tablet Take 2 tablets (650 mg total) by mouth every 4 (four) hours as needed for headache or mild pain. 07/08/15   Erlene Quan, PA-C  alendronate (FOSAMAX) 70 MG tablet Take one tablet by mouth weekly with 8 ounces water, stay upright for 30 minutes after taking, For Osteoporosis 07/02/14   Tiffany L Reed, DO  ALPRAZolam Duanne Moron) 0.5 MG tablet Take 1 tablet (0.5 mg total) by mouth daily. For anxiety 12/31/15   Gildardo Cranker, DO  apixaban (ELIQUIS) 5 MG TABS tablet Take 1 tablet (5  mg total) by mouth 2 (two) times daily. 07/08/15   Erlene Quan, PA-C  Cholecalciferol (VITAMIN D3) 2000 UNITS TABS Take by mouth daily.    Historical Provider, MD  diphenhydrAMINE (SOMINEX) 25 MG tablet Take 25 mg by mouth at bedtime as needed for sleep. Take 1 tablets once daily    Historical Provider, MD  Fructose-Dextrose-Phosphor Acd (ANTI-NAUSEA PO) Take 1 tablet by mouth every 8 (eight) hours as needed (nausea).    Historical Provider, MD  levothyroxine (SYNTHROID, LEVOTHROID) 137 MCG tablet Take one tablet by mouth 30 minutes before breakfast once daily for thyroid 01/05/16   Estill Dooms, MD  metoprolol (LOPRESSOR) 50 MG tablet Take 1 tablet (50 mg total) by mouth 2 (two) times daily. 07/08/15   Erlene Quan, PA-C  pravastatin (PRAVACHOL) 40 MG tablet Take 1 tablet by mouth every evening with meal 11/25/15   Tiffany L Reed, DO  ranitidine (ZANTAC) 75 MG tablet Take 75 mg by mouth 2 (two) times daily as needed (For Heartburn).     Historical Provider, MD  sertraline (ZOLOFT) 25 MG tablet Take 2 tablets (50 mg total) by mouth daily. Take 2 tablets by mouth daily for depression 07/08/15   Erlene Quan, PA-C  triamterene-hydrochlorothiazide (MAXZIDE-25) 37.5-25 MG tablet Take 0.5 tablets by mouth daily. 07/08/15   Erlene Quan, PA-C    Physical Exam: Vitals:   02/11/16 [id number] 02/11/16 0659 02/11/16 0827 02/11/16 0856  BP: 162/88  148/84 136/82  Pulse: 60  (!) 58 (!) 54  Resp: 20  20 18   Temp: 98.7 F (37.1 C)     TempSrc: Oral     SpO2:   97% 93%  Weight:  74.8 kg (165 lb)    Height:  5\' 4"  (1.626 m)      Constitutional:  Pleasant, well-developed white female in NAD, calm, comfortable Vitals:   02/11/16 0658 02/11/16 0659 02/11/16 0827 02/11/16 0856  BP: 162/88  148/84 136/82  Pulse: 60  (!) 58 (!) 54  Resp: 20  20 18   Temp: 98.7 F (37.1 C)     TempSrc: Oral     SpO2:   97% 93%  Weight:  74.8 kg (165 lb)    Height:  5\' 4"  (1.626 m)     Eyes: PER, lids and conjunctivae  normal ENMT: Mucous membranes are moist. Posterior pharynx clear of any exudate or lesions..  Neck: normal, supple, no masses Respiratory: clear to auscultation bilaterally, no wheezing, no crackles. Normal respiratory effort. No accessory muscle use.  Cardiovascular: Regular rate  and rhythm, no murmurs / rubs / gallops. No extremity edema. 2+ dorsal pedis pulses.   Abdomen: no tenderness, no masses palpated. No hepatomegaly. Bowel sounds positive.  Musculoskeletal: LUE splinted /wrapped and elevated on pillow. Nno clubbing / cyanosis. No joint deformity upper and lower extremities. Good ROM, no contractures. Normal muscle tone.  Skin: no rashes, lesions, ulcers.  Neurologic: CN 2-12 grossly intact. Sensation intact, Strength 5/5 in all 4.  Psychiatric: Normal judgment and insight. Alert and oriented x 3. Normal mood.   Labs on Admission: I have personally reviewed following labs and imaging studies  Radiological Exams on Admission: Dg Forearm Left  Result Date: 02/11/2016 CLINICAL DATA:  Fall with forearm pain. EXAM: LEFT FOREARM - 2 VIEW COMPARISON:  None. FINDINGS: Displaced distal radial metaphysis fracture as described on dedicated exam. Displaced ulnar styloid fracture. No proximal forearm fracture or malalignment. Previous radial head fracture with cortical screws. IMPRESSION: Displaced distal radius and ulnar fractures as described on wrist exam. No fracture or malalignment in the proximal forearm. Electronically Signed   By: Monte Fantasia M.D.   On: 02/11/2016 08:26   Dg Wrist Complete Left  Result Date: 02/11/2016 CLINICAL DATA:  Fall with left wrist swelling.  Initial encounter. EXAM: LEFT WRIST - COMPLETE 3+ VIEW COMPARISON:  None. FINDINGS: Transverse distal radius fracture with 50% dorsal displacement and dorsal angulation by at least 50 degrees. The could be continuation to the articular surface, but no articular surface irregularity. Displaced ulnar styloid fracture. No  radiocarpal dislocation. Osteopenia. Marked soft tissue swelling. IMPRESSION: 1. Displaced and angulated distal radial metaphysis fracture. 2. Displaced ulnar styloid fracture. Electronically Signed   By: Monte Fantasia M.D.   On: 02/11/2016 08:24     Assessment/Plan   Principal Problem:   Closed fracture of left distal radius Active Problems:   Essential hypertension, benign   Atrial fibrillation (HCC)   Chronic renal insufficiency, stage III (moderate)      Left radial and ulner fracture following fall last night.  -Place in OBS -Ortho has evaluated and planning for repair in am if medically cleared.  -pre-op ECG, CXR, and urine pending. -monitor 02 sats, she (desaturated on Fentanyl)            -NPO after midnight    CKD stage 3, renal function at baseline . -Minimize use of nephrotoxic medications -am bmet  HTN, BP slightly elevated in ED -continue maxide and lopressor  Atrial fibrillation, rate controlled. Chadsvasc 4. She is s/p cardioversion in March. On Eliquis, last dose last night. . -hold Eliquis in anticipation of surgery in am -Continue home beta blocker  Hyperlipidemia. . -Continue home statin  Hypothyroidism  -continue home synthroid  GERD.  -continue home zantac twice daily as  needed  Osteoporosis.  -continue weekly fosamax.  Depression / anxiety, stable -continue home Zoloft and xanax   -DVT prophylaxis:     SCDs. Takes Eliquis at home (holding now for surgery tomorrow) Code Status:     Full code  Family Communication:    none.  Disposition Plan:   Discharge home in 48 hours              Consults called:  Orthopedics Admission status:   Observation - Medical  Tye Savoy NP [facility] Pager [phone]  If 7PM-7AM, please contact night-coverage [email or web address] Password [email or web address]  02/11/2016, 9:22 AM

## 2016-02-11 NOTE — ED Triage Notes (Signed)
Pt arrives to A08 at this time. Pt reports that she fell on her left wrist from standing height.  Deformity noted to pt's left wrist.   Chief Complaint  Patient presents with  . Fall    Left wrist deformity noted   Past Medical History:  Diagnosis Date  . Anxiety   . Atrial fibrillation with rapid ventricular response ([place]) 07/07/2015  . Bilateral breast cancer ([place])   . Daily headache   . Depression   . GERD (gastroesophageal reflux disease)   . Hyperlipidemia   . Hypertension   . Hypothyroidism   . Iron deficiency anemia 2000s  . Lumbago   . Nausea alone   . Senile osteoporosis   . Squamous cell carcinoma of forehead    "I think they burned it off"

## 2016-02-11 NOTE — Progress Notes (Signed)
Orthopedic Tech Progress Note Patient Details:  Deborah Randall 1940/12/02 [id number]  Ortho Devices Type of Ortho Device: Ace wrap, Sugartong splint Ortho Device/Splint Location: lue Ortho Device/Splint Interventions: Application   Deborah Randall 02/11/2016, 9:09 AM

## 2016-02-11 NOTE — ED Provider Notes (Signed)
[facility] DEPT Provider Note   CSN: [id number] Arrival date & time: 02/11/16  [id number]     History   Chief Complaint Chief Complaint  Patient presents with  . Fall    Left wrist deformity noted    HPI Deborah Randall is a 75 y.o. female.  The history is provided by the patient.  Fall  This is a new problem. The current episode started 6 to 12 hours ago. The problem has been gradually improving. Pertinent negatives include no chest pain, no abdominal pain and no headaches. The symptoms are aggravated by walking. The symptoms are relieved by rest.  Patient reports last night she fell while walking down steps and injured left wrist No LOC No Ha/neck pain No abd pain She has mild pain to tailbone No pain/injury to her legs She reports she was able to get up on her own, but lives alone and unable to reach anyone for help until later in the morning as she did not want to drive herself No other complaints She takes eliquis, last dose at 2300 yesterday  Past Medical History:  Diagnosis Date  . Anxiety   . Atrial fibrillation with rapid ventricular response ([place]) 07/07/2015  . Bilateral breast cancer ([place])   . Daily headache   . Depression   . GERD (gastroesophageal reflux disease)   . Hyperlipidemia   . Hypertension   . Hypothyroidism   . Iron deficiency anemia 2000s  . Lumbago   . Nausea alone   . Senile osteoporosis   . Squamous cell carcinoma of forehead    "I think they burned it off"    Patient Active Problem List   Diagnosis Date Noted  . Persistent atrial fibrillation ([place])   . Chronic anticoagulation 08/11/2015  . Chronic renal insufficiency, stage III (moderate) 08/11/2015  . Chest pain with moderate risk of acute coronary syndrome 07/08/2015  . Atrial fibrillation with rapid ventricular response ([place]) 07/07/2015  . Allergic rhinitis 08/03/2014  . Hyperlipidemia with target LDL less than 100 09/25/2013  . Hypothyroidism 09/05/2012  . Depression 09/05/2012  .  Essential hypertension, benign 09/05/2012  . Senile osteoporosis 09/05/2012  . HX: breast cancer 03/19/2012    Past Surgical History:  Procedure Laterality Date  . ABDOMINAL HYSTERECTOMY  1970s  . BREAST BIOPSY Bilateral   . BREAST LUMPECTOMY Right 2002   "w/lymph nodes removed"  . CARDIOVERSION N/A 08/19/2015   Procedure: CARDIOVERSION;  Surgeon: Sanda Klein, MD;  Location: [facility];  Service: Cardiovascular;  Laterality: N/A;  . CATARACT EXTRACTION W/ INTRAOCULAR LENS  IMPLANT, BILATERAL Bilateral 2000s  . DILATION AND CURETTAGE OF UTERUS  ~ 1968  . ELBOW FRACTURE SURGERY Left 2011   "has 2 screws in it"  . EXCISIONAL HEMORRHOIDECTOMY  ~ 1970  . FRACTURE SURGERY    . HEMIARTHROPLASTY HIP Left 2011   "had ball replaced"  . INGUINAL HERNIA REPAIR Right 1980s?  [name] MASTECTOMY Left 2005  . TOTAL THYROIDECTOMY  1972  . WRIST FRACTURE SURGERY Right    "has a plate and 7 screws in there"    OB History    No data available       Home Medications    Prior to Admission medications   Medication Sig Start Date End Date Taking? Authorizing Provider  acetaminophen (TYLENOL) 325 MG tablet Take 2 tablets (650 mg total) by mouth every 4 (four) hours as needed for headache or mild pain. 07/08/15   Erlene Quan, PA-C  alendronate (FOSAMAX) 70 MG  tablet Take one tablet by mouth weekly with 8 ounces water, stay upright for 30 minutes after taking, For Osteoporosis 07/02/14   Tiffany L Reed, DO  ALPRAZolam Duanne Moron) 0.5 MG tablet Take 1 tablet (0.5 mg total) by mouth daily. For anxiety 12/31/15   Gildardo Cranker, DO  apixaban (ELIQUIS) 5 MG TABS tablet Take 1 tablet (5 mg total) by mouth 2 (two) times daily. 07/08/15   Erlene Quan, PA-C  Cholecalciferol (VITAMIN D3) 2000 UNITS TABS Take by mouth daily.    Historical Provider, MD  diphenhydrAMINE (SOMINEX) 25 MG tablet Take 25 mg by mouth at bedtime as needed for sleep. Take 1 tablets once daily    Historical Provider, MD    Fructose-Dextrose-Phosphor Acd (ANTI-NAUSEA PO) Take 1 tablet by mouth every 8 (eight) hours as needed (nausea).    Historical Provider, MD  levothyroxine (SYNTHROID, LEVOTHROID) 137 MCG tablet Take one tablet by mouth 30 minutes before breakfast once daily for thyroid 01/05/16   Estill Dooms, MD  metoprolol (LOPRESSOR) 50 MG tablet Take 1 tablet (50 mg total) by mouth 2 (two) times daily. 07/08/15   Erlene Quan, PA-C  pravastatin (PRAVACHOL) 40 MG tablet Take 1 tablet by mouth every evening with meal 11/25/15   Tiffany L Reed, DO  ranitidine (ZANTAC) 75 MG tablet Take 75 mg by mouth 2 (two) times daily as needed (For Heartburn).     Historical Provider, MD  sertraline (ZOLOFT) 25 MG tablet Take 2 tablets (50 mg total) by mouth daily. Take 2 tablets by mouth daily for depression 07/08/15   Erlene Quan, PA-C  triamterene-hydrochlorothiazide (MAXZIDE-25) 37.5-25 MG tablet Take 0.5 tablets by mouth daily. 07/08/15   Erlene Quan, PA-C    Family History Family History  Problem Relation Age of Onset  . Cancer Father   . Heart attack Brother   . Mental illness Son   . Other Brother   . Other Brother   . Cancer Sister   . Heart disease Sister   . Hypertension Sister   . Other Brother   . Diabetes Brother   . COPD Brother   . Mental illness Son     Social History Social History  Substance Use Topics  . Smoking status: Never Smoker  . Smokeless tobacco: Never Used  . Alcohol use No     Allergies   Review of patient's allergies indicates no known allergies.   Review of Systems Review of Systems  Constitutional: Negative for fever.  Cardiovascular: Negative for chest pain.  Gastrointestinal: Negative for abdominal pain.  Musculoskeletal: Positive for arthralgias.  Neurological: Negative for headaches.  All other systems reviewed and are negative.    Physical Exam Updated Vital Signs BP 162/88 (BP Location: Right Arm)   Pulse 60   Temp 98.7 F (37.1 C) (Oral)   Resp 20    Ht 5\' 4"  (1.626 m)   Wt 74.8 kg   BMI 28.32 kg/m   Physical Exam  CONSTITUTIONAL: Well developed/well nourished HEAD: Normocephalic/atraumatic EYES: EOMI/PERRL ENMT: Mucous membranes moist, No evidence of facial/nasal trauma NECK: supple no meningeal signs SPINE/BACK:entire spine nontender, No bruising/crepitance/stepoffs noted to spine CV:  no murmurs/rubs/gallops noted LUNGS: Lungs are clear to auscultation bilaterally, no apparent distress ABDOMEN: soft, nontender, no rebound or guarding, bowel sounds noted throughout abdomen GU:no cva tenderness NEURO: Pt is awake/alert/appropriate, moves all extremitiesx4.  No facial droop.   EXTREMITIES: pulses normal/equal, full ROM, tenderness/deformity to left wrist, pulses intact, no lacerations noted, All other  extremities/joints palpated/ranged and nontender SKIN: warm, color normal PSYCH: no abnormalities of mood noted, alert and oriented to situation  ED Treatments / Results  Labs (all labs ordered are listed, but only abnormal results are displayed) Labs Reviewed  CBC - Abnormal; Notable for the following:       Result Value   WBC 10.8 (*)    All other components within normal limits  BASIC METABOLIC PANEL - Abnormal; Notable for the following:    Sodium 134 (*)    Chloride 100 (*)    Glucose, Bld 115 (*)    Creatinine, Ser 1.01 (*)    GFR calc non Af Amer 53 (*)    All other components within normal limits    EKG  EKG Interpretation  Date/Time:  Friday February 11 2016 09:26:31 EDT Ventricular Rate:  52 PR Interval:    QRS Duration: 102 QT Interval:  476 QTC Calculation: 443 R Axis:   39 Text Interpretation:  Sinus rhythm Consider left atrial enlargement Probable left ventricular hypertrophy Abnormal ekg Confirmed by Christy Gentles  MD, Benigno Check ([id number]) on 02/11/2016 9:40:09 AM       Radiology Dg Forearm Left  Result Date: 02/11/2016 CLINICAL DATA:  Fall with forearm pain. EXAM: LEFT FOREARM - 2 VIEW COMPARISON:   None. FINDINGS: Displaced distal radial metaphysis fracture as described on dedicated exam. Displaced ulnar styloid fracture. No proximal forearm fracture or malalignment. Previous radial head fracture with cortical screws. IMPRESSION: Displaced distal radius and ulnar fractures as described on wrist exam. No fracture or malalignment in the proximal forearm. Electronically Signed   By: Monte Fantasia M.D.   On: 02/11/2016 08:26   Dg Wrist Complete Left  Result Date: 02/11/2016 CLINICAL DATA:  Fall with left wrist swelling.  Initial encounter. EXAM: LEFT WRIST - COMPLETE 3+ VIEW COMPARISON:  None. FINDINGS: Transverse distal radius fracture with 50% dorsal displacement and dorsal angulation by at least 50 degrees. The could be continuation to the articular surface, but no articular surface irregularity. Displaced ulnar styloid fracture. No radiocarpal dislocation. Osteopenia. Marked soft tissue swelling. IMPRESSION: 1. Displaced and angulated distal radial metaphysis fracture. 2. Displaced ulnar styloid fracture. Electronically Signed   By: Monte Fantasia M.D.   On: 02/11/2016 08:24   Dg Chest Port 1 View  Result Date: 02/11/2016 CLINICAL DATA:  Preop evaluation EXAM: PORTABLE CHEST 1 VIEW COMPARISON:  07/07/2015 FINDINGS: Heart size mildly enlarged. Negative for heart failure. Lungs are clear without infiltrate or effusion. Hiatal hernia best seen on the prior study. Thoracic scoliosis. IMPRESSION: No active disease. Electronically Signed   By: Franchot Gallo M.D.   On: 02/11/2016 09:25    Procedures Procedures (including critical care time) SPLINT APPLICATION Date/Time: 123456 AM Authorized by: Sharyon Cable Consent: Verbal consent obtained. Risks and benefits: risks, benefits and alternatives were discussed Consent given by: patient Splint applied by: orthopedic technician Location details: left upper extremity Splint type: sugartong Supplies used: ortho glass Post-procedure: The  splinted body part was neurovascularly unchanged following the procedure. Patient tolerance: Patient tolerated the procedure well with no immediate complications.     Medications Ordered in ED Medications  fentaNYL (SUBLIMAZE) injection 50 mcg (50 mcg Intravenous Given 02/11/16 0727)  ondansetron (ZOFRAN) injection 4 mg (4 mg Intravenous Given 02/11/16 0853)  fentaNYL (SUBLIMAZE) injection 100 mcg (100 mcg Intravenous Given 02/11/16 0853)  fentaNYL (SUBLIMAZE) injection 50 mcg (50 mcg Intravenous Given 02/11/16 0935)     Initial Impression / Assessment and Plan / ED Course  I have  reviewed the triage vital signs and the nursing notes.  Pertinent labs & imaging results that were available during my care of the patient were reviewed by me and considered in my medical decision making (see chart for details).  Clinical Course    D/w dr Lenon Curt He requests to defer reduction, splint, he will perform operative repair tomorrow She is to be NPO after midnight Will need medical admit to adjust her eliquis (last dose at 2300 on 9/21) D/w [facility] for admission Pt agreeable  Final Clinical Impressions(s) / ED Diagnoses   Final diagnoses:  Closed fracture of left distal radius, initial encounter  Intractable pain    New Prescriptions New Prescriptions   No medications on file     Ripley Fraise, MD 02/11/16 [id number]

## 2016-02-12 ENCOUNTER — Observation Stay (HOSPITAL_COMMUNITY): Payer: Commercial Managed Care - HMO | Admitting: Certified Registered Nurse Anesthetist

## 2016-02-12 ENCOUNTER — Encounter (HOSPITAL_COMMUNITY): Payer: Self-pay | Admitting: Certified Registered Nurse Anesthetist

## 2016-02-12 ENCOUNTER — Encounter (HOSPITAL_COMMUNITY)
Admission: EM | Disposition: A | Discharge status: Home / Self Care | Payer: Self-pay | Source: Home / Self Care | Attending: Emergency Medicine

## 2016-02-12 DIAGNOSIS — G8918 Other acute postprocedural pain: Secondary | ICD-10-CM | POA: Diagnosis not present

## 2016-02-12 DIAGNOSIS — W108XXA Fall (on) (from) other stairs and steps, initial encounter: Secondary | ICD-10-CM | POA: Diagnosis not present

## 2016-02-12 DIAGNOSIS — E039 Hypothyroidism, unspecified: Secondary | ICD-10-CM | POA: Diagnosis not present

## 2016-02-12 DIAGNOSIS — K219 Gastro-esophageal reflux disease without esophagitis: Secondary | ICD-10-CM | POA: Diagnosis not present

## 2016-02-12 DIAGNOSIS — E785 Hyperlipidemia, unspecified: Secondary | ICD-10-CM | POA: Diagnosis not present

## 2016-02-12 DIAGNOSIS — I481 Persistent atrial fibrillation: Secondary | ICD-10-CM | POA: Diagnosis not present

## 2016-02-12 DIAGNOSIS — F419 Anxiety disorder, unspecified: Secondary | ICD-10-CM | POA: Diagnosis not present

## 2016-02-12 DIAGNOSIS — S52502A Unspecified fracture of the lower end of left radius, initial encounter for closed fracture: Secondary | ICD-10-CM | POA: Diagnosis not present

## 2016-02-12 DIAGNOSIS — S52532A Colles' fracture of left radius, initial encounter for closed fracture: Secondary | ICD-10-CM | POA: Diagnosis not present

## 2016-02-12 DIAGNOSIS — I10 Essential (primary) hypertension: Secondary | ICD-10-CM | POA: Diagnosis not present

## 2016-02-12 DIAGNOSIS — M810 Age-related osteoporosis without current pathological fracture: Secondary | ICD-10-CM | POA: Diagnosis not present

## 2016-02-12 DIAGNOSIS — S52512A Displaced fracture of left radial styloid process, initial encounter for closed fracture: Secondary | ICD-10-CM | POA: Diagnosis not present

## 2016-02-12 DIAGNOSIS — S52612A Displaced fracture of left ulna styloid process, initial encounter for closed fracture: Secondary | ICD-10-CM | POA: Diagnosis not present

## 2016-02-12 HISTORY — PX: ORIF WRIST FRACTURE: SHX2133

## 2016-02-12 HISTORY — PX: FRACTURE SURGERY: SHX138

## 2016-02-12 LAB — CBC
HCT: 39.2 % (ref 36.0–46.0)
HEMOGLOBIN: 12 g/dL (ref 12.0–15.0)
MCH: 27.3 pg (ref 26.0–34.0)
MCHC: 30.6 g/dL (ref 30.0–36.0)
MCV: 89.3 fL (ref 78.0–100.0)
Platelets: 129 10*3/uL — ABNORMAL LOW (ref 150–400)
RBC: 4.39 MIL/uL (ref 3.87–5.11)
RDW: 13.2 % (ref 11.5–15.5)
WBC: 9.9 10*3/uL (ref 4.0–10.5)

## 2016-02-12 LAB — BASIC METABOLIC PANEL
Anion gap: 6 (ref 5–15)
BUN: 11 mg/dL (ref 6–20)
CALCIUM: 8.7 mg/dL — AB (ref 8.9–10.3)
CO2: 30 mmol/L (ref 22–32)
CREATININE: 0.92 mg/dL (ref 0.44–1.00)
Chloride: 98 mmol/L — ABNORMAL LOW (ref 101–111)
GFR calc Af Amer: >60 mL/min (ref >60)
GFR, EST NON AFRICAN AMERICAN: 59 mL/min — AB (ref >60)
GLUCOSE: 120 mg/dL — AB (ref 65–99)
Potassium: 4.4 mmol/L (ref 3.5–5.1)
SODIUM: 134 mmol/L — AB (ref 135–145)

## 2016-02-12 SURGERY — OPEN REDUCTION INTERNAL FIXATION (ORIF) WRIST FRACTURE
Anesthesia: Regional | Site: Wrist | Laterality: Left

## 2016-02-12 MED ORDER — CEFAZOLIN SODIUM 1 G IJ SOLR
INTRAMUSCULAR | Status: AC
  Filled 2016-02-12: qty 20

## 2016-02-12 MED ORDER — ROCURONIUM BROMIDE 10 MG/ML (PF) SYRINGE
PREFILLED_SYRINGE | INTRAVENOUS | Status: AC
  Filled 2016-02-12: qty 10

## 2016-02-12 MED ORDER — OXYCODONE HCL 5 MG PO TABS: 5.0000 mg | ORAL_TABLET | Freq: Once | ORAL | Status: DC | PRN

## 2016-02-12 MED ORDER — BUPIVACAINE HCL (PF) 0.25 % IJ SOLN
INTRAMUSCULAR | Status: AC
  Filled 2016-02-12: qty 30

## 2016-02-12 MED ORDER — OXYCODONE HCL 5 MG/5ML PO SOLN: 5.0000 mg | Freq: Once | ORAL | Status: DC | PRN

## 2016-02-12 MED ORDER — FENTANYL CITRATE (PF) 100 MCG/2ML IJ SOLN
INTRAMUSCULAR | Status: DC | PRN
  Administered 2016-02-12 (×2): 25 ug via INTRAVENOUS

## 2016-02-12 MED ORDER — ONDANSETRON HCL 4 MG/2ML IJ SOLN
INTRAMUSCULAR | Status: AC
  Filled 2016-02-12: qty 2

## 2016-02-12 MED ORDER — PROPOFOL 10 MG/ML IV BOLUS
INTRAVENOUS | Status: DC | PRN
  Administered 2016-02-12: 120 mg via INTRAVENOUS

## 2016-02-12 MED ORDER — 0.9 % SODIUM CHLORIDE (POUR BTL) OPTIME
TOPICAL | Status: DC | PRN
  Administered 2016-02-12: 1000 mL

## 2016-02-12 MED ORDER — FENTANYL CITRATE (PF) 100 MCG/2ML IJ SOLN: 25.0000 ug | INTRAMUSCULAR | Status: DC | PRN

## 2016-02-12 MED ORDER — LIDOCAINE 2% (20 MG/ML) 5 ML SYRINGE
INTRAMUSCULAR | Status: AC
  Filled 2016-02-12: qty 5

## 2016-02-12 MED ORDER — EPHEDRINE SULFATE-NACL 50-0.9 MG/10ML-% IV SOSY
PREFILLED_SYRINGE | INTRAVENOUS | Status: DC | PRN
  Administered 2016-02-12: 10 mg via INTRAVENOUS
  Administered 2016-02-12: 5 mg via INTRAVENOUS
  Administered 2016-02-12 (×3): 10 mg via INTRAVENOUS

## 2016-02-12 MED ORDER — ONDANSETRON HCL 4 MG/2ML IJ SOLN
INTRAMUSCULAR | Status: DC | PRN
  Administered 2016-02-12: 4 mg via INTRAVENOUS

## 2016-02-12 MED ORDER — EPHEDRINE 5 MG/ML INJ
INTRAVENOUS | Status: AC
  Filled 2016-02-12: qty 10

## 2016-02-12 MED ORDER — FENTANYL CITRATE (PF) 100 MCG/2ML IJ SOLN
INTRAMUSCULAR | Status: AC
  Filled 2016-02-12: qty 2

## 2016-02-12 MED ORDER — EPHEDRINE SULFATE 50 MG/ML IJ SOLN: INTRAMUSCULAR | Status: DC | PRN

## 2016-02-12 MED ORDER — LACTATED RINGERS IV SOLN
INTRAVENOUS | Status: DC | PRN
  Administered 2016-02-12: 07:00:00 via INTRAVENOUS

## 2016-02-12 MED ORDER — PROPOFOL 10 MG/ML IV BOLUS
INTRAVENOUS | Status: AC
  Filled 2016-02-12: qty 20

## 2016-02-12 MED ORDER — LIDOCAINE 2% (20 MG/ML) 5 ML SYRINGE
INTRAMUSCULAR | Status: DC | PRN
  Administered 2016-02-12: 40 mg via INTRAVENOUS

## 2016-02-12 MED ORDER — CEFAZOLIN SODIUM-DEXTROSE 2-3 GM-% IV SOLR
INTRAVENOUS | Status: DC | PRN
  Administered 2016-02-12: 2 g via INTRAVENOUS

## 2016-02-12 MED ORDER — ONDANSETRON HCL 4 MG/2ML IJ SOLN: 4.0000 mg | Freq: Once | INTRAMUSCULAR | Status: DC | PRN

## 2016-02-12 MED ORDER — OXYCODONE-ACETAMINOPHEN 5-325 MG PO TABS: 1.0000 | ORAL_TABLET | ORAL | 0 refills | Status: DC | PRN

## 2016-02-12 SURGICAL SUPPLY — 42 items
BANDAGE ACE 3X5.8 VEL STRL LF (GAUZE/BANDAGES/DRESSINGS) ×2 IMPLANT
BIT DRILL 2.2 SS TIBIAL (BIT) ×2 IMPLANT
CANISTER SUCTION 1500CC (MISCELLANEOUS) ×3 IMPLANT
CORDS BIPOLAR (ELECTRODE) ×3 IMPLANT
COVER SURGICAL LIGHT HANDLE (MISCELLANEOUS) ×3 IMPLANT
CUFF TOURNIQUET SINGLE 18IN (TOURNIQUET CUFF) ×3 IMPLANT
DRAPE OEC MINIVIEW 54X84 (DRAPES) ×3 IMPLANT
DRAPE SURG 17X23 STRL (DRAPES) ×3 IMPLANT
GAUZE SPONGE 4X4 12PLY STRL (GAUZE/BANDAGES/DRESSINGS) ×2 IMPLANT
GAUZE XEROFORM 1X8 LF (GAUZE/BANDAGES/DRESSINGS) ×2 IMPLANT
GLOVE BIOGEL M 8.0 STRL (GLOVE) ×3 IMPLANT
GOWN STRL REUS W/ TWL XL LVL3 (GOWN DISPOSABLE) ×2 IMPLANT
GOWN STRL REUS W/TWL XL LVL3 (GOWN DISPOSABLE) ×6
K-WIRE 1.6 (WIRE) ×3
K-WIRE FX5X1.6XNS BN SS (WIRE) ×1
KIT BASIN OR (CUSTOM PROCEDURE TRAY) ×3 IMPLANT
KWIRE FX5X1.6XNS BN SS (WIRE) IMPLANT
NS IRRIG 1000ML POUR BTL (IV SOLUTION) ×3 IMPLANT
PACK ORTHO EXTREMITY (CUSTOM PROCEDURE TRAY) ×3 IMPLANT
PAD CAST 3X4 CTTN HI CHSV (CAST SUPPLIES) IMPLANT
PAD CAST 4YDX4 CTTN HI CHSV (CAST SUPPLIES) IMPLANT
PADDING CAST COTTON 3X4 STRL (CAST SUPPLIES) ×3
PADDING CAST COTTON 4X4 STRL (CAST SUPPLIES) ×3
PEG LOCKING SMOOTH 2.2X18 (Peg) ×6 IMPLANT
PLATE NARROW DVR LEFT (Plate) ×2 IMPLANT
SCREW LOCK 12X2.7X 3 LD (Screw) IMPLANT
SCREW LOCK 16X2.7X 3 LD TPR (Screw) IMPLANT
SCREW LOCK 18X2.7X 3 LD TPR (Screw) IMPLANT
SCREW LOCKING 2.7X12MM (Screw) ×9 IMPLANT
SCREW LOCKING 2.7X16 (Screw) ×6 IMPLANT
SCREW LOCKING 2.7X18 (Screw) ×6 IMPLANT
SPLINT FIBERGLASS 3X12 (CAST SUPPLIES) ×2 IMPLANT
SUT ETHILON 4 0 PS 2 18 (SUTURE) ×2 IMPLANT
SUT MNCRL AB 4-0 PS2 18 (SUTURE) ×2 IMPLANT
SUT VIC AB 3-0 SH 27 (SUTURE) ×3
SUT VIC AB 3-0 SH 27XBRD (SUTURE) IMPLANT
SYR BULB 3OZ (MISCELLANEOUS) ×3 IMPLANT
TOWEL OR 17X24 6PK STRL BLUE (TOWEL DISPOSABLE) ×3 IMPLANT
TUBE CONNECTING 20'X1/4 (TUBING) ×1
TUBE CONNECTING 20X1/4 (TUBING) ×2 IMPLANT
UNDERPAD 30X30 (UNDERPADS AND DIAPERS) ×3 IMPLANT
WATER STERILE IRR 1000ML POUR (IV SOLUTION) ×3 IMPLANT

## 2016-02-12 NOTE — H&P (Signed)
Reason for Consult:fracture  Referring Physician: ER  CC:I fell and hurt my wrist  HPI:  Deborah Randall is an 75 y.o. left handed female who presents with fall, sustaining a fracture to L wrist.  Denies any LOC.  Denies any other injuries       .   Pain is rated at   6 /10 and is described as sharp.  Pain is constant.  Pain is made better by rest/immobilization, worse with motion.   Associated signs/symptoms:L wirst pain and deformity, denies numbness Previous treatment:    Past Medical History:  Diagnosis Date  . Anxiety   . Atrial fibrillation with rapid ventricular response ([place]) 07/07/2015  . Bilateral breast cancer ([facility])   . Daily headache   . Depression   . GERD (gastroesophageal reflux disease)   . Hyperlipidemia   . Hypertension   . Hypothyroidism   . Iron deficiency anemia 2000s  . Lumbago   . Nausea alone   . Senile osteoporosis   . Squamous cell carcinoma of forehead    "I think they burned it off"    Past Surgical History:  Procedure Laterality Date  . ABDOMINAL HYSTERECTOMY  1970s  . BREAST BIOPSY Bilateral   . BREAST LUMPECTOMY Right 2002   "w/lymph nodes removed"  . CARDIOVERSION N/A 08/19/2015   Procedure: CARDIOVERSION;  Surgeon: Sanda Klein, MD;  Location: [facility];  Service: Cardiovascular;  Laterality: N/A;  . CATARACT EXTRACTION W/ INTRAOCULAR LENS  IMPLANT, BILATERAL Bilateral 2000s  . DILATION AND CURETTAGE OF UTERUS  ~ 1968  . ELBOW FRACTURE SURGERY Left 2011   "has 2 screws in it"  . EXCISIONAL HEMORRHOIDECTOMY  ~ 1970  . FRACTURE SURGERY    . HEMIARTHROPLASTY HIP Left 2011   "had ball replaced"  . INGUINAL HERNIA REPAIR Right 1980s?  [name] MASTECTOMY Left 2005  . TOTAL THYROIDECTOMY  1972  . WRIST FRACTURE SURGERY Right    "has a plate and 7 screws in there"    Family History  Problem Relation Age of Onset  . Cancer Father   . Heart attack Brother   . Mental illness Son   . Other Brother   . Other Brother   . Cancer Sister   . Heart  disease Sister   . Hypertension Sister   . Other Brother   . Diabetes Brother   . COPD Brother   . Mental illness Son     Social History:  reports that she has never smoked. She has never used smokeless tobacco. She reports that she does not drink alcohol or use drugs.  Allergies: No Known Allergies  Medications: I have reviewed the patient's current medications.  Results for orders placed or performed during the hospital encounter of 02/11/16 (from the past 48 hour(s))  CBC     Status: Abnormal   Collection Time: 02/11/16  7:13 AM  Result Value Ref Range   WBC 10.8 (H) 4.0 - 10.5 K/uL   RBC 4.61 3.87 - 5.11 MIL/uL   Hemoglobin 12.8 12.0 - 15.0 g/dL   HCT 40.2 36.0 - 46.0 %   MCV 87.2 78.0 - 100.0 fL   MCH 27.8 26.0 - 34.0 pg   MCHC 31.8 30.0 - 36.0 g/dL   RDW 13.1 11.5 - 15.5 %   Platelets 163 150 - 400 K/uL  Basic metabolic panel     Status: Abnormal   Collection Time: 02/11/16  7:13 AM  Result Value Ref Range   Sodium 134 (L) 135 -  145 mmol/L   Potassium 3.7 3.5 - 5.1 mmol/L   Chloride 100 (L) 101 - 111 mmol/L   CO2 24 22 - 32 mmol/L   Glucose, Bld 115 (H) 65 - 99 mg/dL   BUN 16 6 - 20 mg/dL   Creatinine, Ser 1.01 (H) 0.44 - 1.00 mg/dL   Calcium 8.9 8.9 - 10.3 mg/dL   GFR calc non Af Amer 53 (L) >60 mL/min   GFR calc Af Amer >60 >60 mL/min    Comment: (NOTE) The eGFR has been calculated using the CKD EPI equation. This calculation has not been validated in all clinical situations. eGFR's persistently <60 mL/min signify possible Chronic Kidney Disease.    Anion gap 10 5 - 15  Surgical PCR screen     Status: None   Collection Time: 02/11/16  8:12 PM  Result Value Ref Range   MRSA, PCR NEGATIVE NEGATIVE   Staphylococcus aureus NEGATIVE NEGATIVE    Comment:        The Xpert SA Assay (FDA approved for NASAL specimens in patients over 5 years of age), is one component of a comprehensive surveillance program.  Test performance has been validated by [facility]  for patients greater than or equal to 54 year old. It is not intended to diagnose infection nor to guide or monitor treatment.   Basic metabolic panel     Status: Abnormal   Collection Time: 02/12/16  5:14 AM  Result Value Ref Range   Sodium 134 (L) 135 - 145 mmol/L   Potassium 4.4 3.5 - 5.1 mmol/L   Chloride 98 (L) 101 - 111 mmol/L   CO2 30 22 - 32 mmol/L   Glucose, Bld 120 (H) 65 - 99 mg/dL   BUN 11 6 - 20 mg/dL   Creatinine, Ser 0.92 0.44 - 1.00 mg/dL   Calcium 8.7 (L) 8.9 - 10.3 mg/dL   GFR calc non Af Amer 59 (L) >60 mL/min   GFR calc Af Amer >60 >60 mL/min    Comment: (NOTE) The eGFR has been calculated using the CKD EPI equation. This calculation has not been validated in all clinical situations. eGFR's persistently <60 mL/min signify possible Chronic Kidney Disease.    Anion gap 6 5 - 15  CBC     Status: Abnormal   Collection Time: 02/12/16  5:14 AM  Result Value Ref Range   WBC 9.9 4.0 - 10.5 K/uL   RBC 4.39 3.87 - 5.11 MIL/uL   Hemoglobin 12.0 12.0 - 15.0 g/dL   HCT 39.2 36.0 - 46.0 %   MCV 89.3 78.0 - 100.0 fL   MCH 27.3 26.0 - 34.0 pg   MCHC 30.6 30.0 - 36.0 g/dL   RDW 13.2 11.5 - 15.5 %   Platelets 129 (L) 150 - 400 K/uL    Dg Forearm Left  Result Date: 02/11/2016 CLINICAL DATA:  Fall with forearm pain. EXAM: LEFT FOREARM - 2 VIEW COMPARISON:  None. FINDINGS: Displaced distal radial metaphysis fracture as described on dedicated exam. Displaced ulnar styloid fracture. No proximal forearm fracture or malalignment. Previous radial head fracture with cortical screws. IMPRESSION: Displaced distal radius and ulnar fractures as described on wrist exam. No fracture or malalignment in the proximal forearm. Electronically Signed   By: Monte Fantasia M.D.   On: 02/11/2016 08:26   Dg Wrist Complete Left  Result Date: 02/11/2016 CLINICAL DATA:  Fall with left wrist swelling.  Initial encounter. EXAM: LEFT WRIST - COMPLETE 3+ VIEW COMPARISON:  None. FINDINGS:  Transverse  distal radius fracture with 50% dorsal displacement and dorsal angulation by at least 50 degrees. The could be continuation to the articular surface, but no articular surface irregularity. Displaced ulnar styloid fracture. No radiocarpal dislocation. Osteopenia. Marked soft tissue swelling. IMPRESSION: 1. Displaced and angulated distal radial metaphysis fracture. 2. Displaced ulnar styloid fracture. Electronically Signed   By: Monte Fantasia M.D.   On: 02/11/2016 08:24   Dg Chest Port 1 View  Result Date: 02/11/2016 CLINICAL DATA:  Preop evaluation EXAM: PORTABLE CHEST 1 VIEW COMPARISON:  07/07/2015 FINDINGS: Heart size mildly enlarged. Negative for heart failure. Lungs are clear without infiltrate or effusion. Hiatal hernia best seen on the prior study. Thoracic scoliosis. IMPRESSION: No active disease. Electronically Signed   By: Franchot Gallo M.D.   On: 02/11/2016 09:25    Pertinent items are noted in HPI. Temp:  [97.7 F (36.5 C)-99.1 F (37.3 C)] 99.1 F (37.3 C) (09/23 0440) Pulse Rate:  [50-58] 57 (09/23 0440) Resp:  [18-22] 18 (09/23 0440) BP: (112-157)/(52-84) 112/55 (09/23 0440) SpO2:  [93 %-100 %] 100 % (09/23 0440) General appearance: alert and cooperative Resp: clear to auscultation bilaterally Cardio: irregularly irregular rhythm GI: soft, non-tender; bowel sounds normal; no masses,  no organomegaly Extremities: edema of Left wrist RUE - wnl; LUE with wrist deformity, + swelling, + bruising, n/v intact distally  Assessment: L distal radius fracture Plan: ORIF I have discussed this treatment plan in detail with patient  including the risks of the recommended treatment or surgery, the benefits and the alternatives.  The patient understands that additional treatment may be necessary.  Merlyn Conley CHRISTOPHER 02/12/2016, 7:20 AM

## 2016-02-12 NOTE — Progress Notes (Signed)
Pt discharged to home with friend.  Pt verbalized understanding of discharge instruction and follow up care. All belongings sent home with friend and pt.  Education provided re: pain management, incisional and dressing care, when to call MD and follow up care.  Danton Clap, RN

## 2016-02-12 NOTE — Transfer of Care (Signed)
Immediate Anesthesia Transfer of Care Note  Patient: Deborah Randall  Procedure(s) Performed: Procedure(s): OPEN REDUCTION INTERNAL FIXATION LEFT DISTAL RADIUS (Left)  Patient Location: PACU  Anesthesia Type:General and Regional  Level of Consciousness: awake, alert  and oriented  Airway & Oxygen Therapy: Patient Spontanous Breathing and Patient connected to nasal cannula oxygen  Post-op Assessment: Report given to RN, Post -op Vital signs reviewed and stable and Patient moving all extremities X 4  Post vital signs: Reviewed and stable  Last Vitals:  Vitals:   02/11/16 2211 02/12/16 0440  BP:  (!) 112/55  Pulse: (!) 56 (!) 57  Resp:  18  Temp:  37.3 C    Last Pain:  Vitals:   02/12/16 0454  TempSrc:   PainSc: 7       Patients Stated Pain Goal: 2 ([id number])  Complications: No apparent anesthesia complications

## 2016-02-12 NOTE — Anesthesia Postprocedure Evaluation (Signed)
Anesthesia Post Note  Patient: Deborah Randall  Procedure(s) Performed: Procedure(s) (LRB): OPEN REDUCTION INTERNAL FIXATION LEFT DISTAL RADIUS (Left)  Patient location during evaluation: PACU Anesthesia Type: General and Regional Level of consciousness: awake, awake and alert and oriented Pain management: pain level controlled Vital Signs Assessment: post-procedure vital signs reviewed and stable Respiratory status: spontaneous breathing, nonlabored ventilation and respiratory function stable Cardiovascular status: blood pressure returned to baseline Anesthetic complications: no    Last Vitals:  Vitals:   02/12/16 0915 02/12/16 0930  BP: (!) 108/53 (!) 112/56  Pulse: 79 75  Resp: 16 14  Temp:  36.7 C    Last Pain:  Vitals:   02/12/16 1007  TempSrc:   PainSc: 0-No pain                 Cloys Vera COKER

## 2016-02-12 NOTE — Anesthesia Preprocedure Evaluation (Addendum)
Anesthesia Evaluation  Patient identified by MRN, date of birth, ID band Patient awake    Reviewed: Allergy & Precautions, NPO status , Patient's Chart, lab work & pertinent test results  Airway Mallampati: III  TM Distance: <3 FB Neck ROM: Full  Mouth opening: Limited Mouth Opening  Dental  (+) Dental Advisory Given, Teeth Intact,    Pulmonary    breath sounds clear to auscultation       Cardiovascular hypertension, Pt. on medications and Pt. on home beta blockers + dysrhythmias Atrial Fibrillation  Rhythm:Regular Rate:Normal     Neuro/Psych  Headaches, PSYCHIATRIC DISORDERS Anxiety Depression    GI/Hepatic GERD  ,  Endo/Other  Hypothyroidism   Renal/GU      Musculoskeletal   Abdominal   Peds  Hematology   Anesthesia Other Findings   Reproductive/Obstetrics                          Anesthesia Physical Anesthesia Plan  ASA: III  Anesthesia Plan: General and Regional   Post-op Pain Management:  Regional for Post-op pain   Induction: Intravenous  Airway Management Planned: LMA  Additional Equipment:   Intra-op Plan:   Post-operative Plan:   Informed Consent: I have reviewed the patients History and Physical, chart, labs and discussed the procedure including the risks, benefits and alternatives for the proposed anesthesia with the patient or authorized representative who has indicated his/her understanding and acceptance.   Dental advisory given  Plan Discussed with: CRNA and Anesthesiologist  Anesthesia Plan Comments:         Anesthesia Quick Evaluation

## 2016-02-12 NOTE — Anesthesia Procedure Notes (Signed)
Procedure Name: LMA Insertion Date/Time: 02/12/2016 7:56 AM Performed by: Garrison Columbus T Pre-anesthesia Checklist: Patient identified, Emergency Drugs available, Suction available and Patient being monitored Patient Re-evaluated:Patient Re-evaluated prior to inductionOxygen Delivery Method: Circle System Utilized Preoxygenation: Pre-oxygenation with 100% oxygen Intubation Type: IV induction LMA: LMA inserted LMA Size: 4.0 Number of attempts: 1 Airway Equipment and Method: Bite block Placement Confirmation: positive ETCO2 Tube secured with: Tape Dental Injury: Teeth and Oropharynx as per pre-operative assessment

## 2016-02-12 NOTE — Anesthesia Procedure Notes (Signed)
Anesthesia Regional Block:  Supraclavicular block  Pre-Anesthetic Checklist: ,, timeout performed, Correct Patient, Correct Site, Correct Laterality, Correct Procedure, Correct Position, site marked, Risks and benefits discussed,  Surgical consent,  Pre-op evaluation,  At surgeon's request and post-op pain management  Laterality: Left  Prep: chloraprep       Needles:  Injection technique: Single-shot  Needle Type: Echogenic Stimulator Needle     Needle Length: 9cm 9 cm Needle Gauge: 21 and 21 G    Additional Needles:  Procedures: ultrasound guided (picture in chart) Supraclavicular block Narrative:  Start time: 02/12/2016 7:40 AM End time: 02/12/2016 7:45 AM Injection made incrementally with aspirations every 5 mL.

## 2016-02-12 NOTE — Discharge Instructions (Signed)
Discharge Instructions:  Keep your dressing clean, dry and in place until instructed to remove by Dr. Royal Vandevoort.  If the dressing becomes dirty or wet call the office for instructions during business hours. Elevate the extremity to help with swelling, this will also help with any discomfort. Take your medication as prescribed. No lifting with the injured  extremity. If you feel that the dressing is too tight, you may loosen it, but keep it on; finger tips should be pink; if there is a concern, call the office. [phone] Ice may be used if the injury is a fracture, do not apply ice directly to the skin. Please call the office on the next business day after discharge to arrange a follow up appointment.  Call [phone] between the hours of 9am - 5pm M-Th or 9am - 1pm on Fri. For most hand injuries and/or conditions, you may return to work using the uninjured hand (one handed duty) within 24-72 hours.  A detailed note will be provided to you at your follow up appointment or may contact the office prior to your follow up.    

## 2016-02-14 ENCOUNTER — Encounter (HOSPITAL_COMMUNITY): Payer: Self-pay | Admitting: General Surgery

## 2016-02-18 ENCOUNTER — Other Ambulatory Visit: Payer: Self-pay

## 2016-02-18 DIAGNOSIS — F329 Major depressive disorder, single episode, unspecified: Secondary | ICD-10-CM

## 2016-02-18 DIAGNOSIS — F32A Depression, unspecified: Secondary | ICD-10-CM

## 2016-02-18 MED ORDER — SERTRALINE HCL 25 MG PO TABS: 50.0000 mg | ORAL_TABLET | Freq: Every day | ORAL | 1 refills | Status: DC

## 2016-02-22 NOTE — Op Note (Signed)
Deborah Randall, ASELTINE                   ACCOUNT NO.:  [id number]  MEDICAL RECORD NO.:  [id number]  LOCATION:  5N12C                        FACILITY:  [facility]  PHYSICIAN:  Dennie Bible, MD    DATE OF BIRTH:  11/16/1940  DATE OF PROCEDURE:  02/12/2016 DATE OF DISCHARGE:  02/12/2016                              OPERATIVE REPORT   PREOPERATIVE DIAGNOSIS:  Closed displaced fracture of the left distal radius and ulnar styloid.  POSTOPERATIVE DIAGNOSIS:  Closed displaced fracture of the left distal radius and ulnar styloid.  PROCEDURE:  Open reduction and internal fixation of the left distal radius with a [employer] with volar plate.  ANESTHESIA:  General.  COMPLICATIONS:  No acute complications.  ESTIMATED BLOOD LOSS:  Minimal.  TOURNIQUET TIME:  Approximately 45 minutes.  INDICATIONS:  [name] is a 75 year old female, who fell on an outstretched hand yesterday.  She had previously been taking blood thinners.  She was admitted to the hospital overnight.  The blood thinners were held in anticipation for surgery today.  Because of the half-life was plantar was felt it was felt safe to proceed with surgery today.  Consent was obtained.  DESCRIPTION OF PROCEDURE:  The patient was taken to the operating room, placed supine on the operating room table.  All extremities were padded.  Secured time-out was performed. The left upper extremity was prepped and draped in normal sterile fashion.  Tourniquet was used on the upper arm.  The arm was exsanguinated.  The tourniquet was inflated to 250 mmHg.  Overlying, the FCR tendon, an incision was made.  Dissection was carefully taken down between the FCR tendon and radial artery to expose the pronator quadratus muscle at the fracture site.  The pronator was released in an L-type fashion exposing the fracture site.  Fracture site was irrigated and with in-line traction and pressure.  The fracture was nicely reduced.  An appropriate size  volar plate was chosen, temporarily held in place and adjusted under fluoroscopy until central placement was obtained and good plate length.  Next, the radial shaft screws were each drilled measured in appropriate size.  Cortical screws were placed. Following the distal radius screws were each drilled measured and appropriate size locking screws were placed.  Postoperative fluoroscopy showed good reduction, good plate, fixation, and good plate and screw length.  The wound was then irrigated.  The pronator quadratus was approximated over the plate.  The deep fascia was closed with interrupted 3-0 Vicryl.  The subcutaneous layers were closed with 3-0 Vicryl and the skin was closed with a running 4-0 Monocryl suture.  Sterile dressing and splint were applied.  The patient tolerated procedure well and taken to recovery in stable condition.     Dennie Bible, MD     HCC/MEDQ  D:  02/21/2016  T:  02/22/2016  Job:  [id number]

## 2016-02-22 NOTE — Discharge Summary (Signed)
NAMESTASHA, Deborah Randall                   ACCOUNT NO.:  [id number]  MEDICAL RECORD NO.:  [id number]  LOCATION:  5N12C                        FACILITY:  [facility]  PHYSICIAN:  Dennie Bible, MD    DATE OF BIRTH:  03-25-1941  DATE OF ADMISSION:  02/11/2016 DATE OF DISCHARGE:  02/12/2016                              DISCHARGE SUMMARY   ADMITTING DIAGNOSIS:  Closed fracture of the left distal radius and ulnar styloid.  DISCHARGE DIAGNOSIS:  Closed fracture of the left distal radius and ulnar styloid.  HOSPITAL COURSE:  [name] is a 75 year old female, who fell on an outstretched hand sustaining closed fracture to her left wrist because of her independent lifestyle.  She was admitted to the hospital overnight for scheduled surgery on the following day.  Of note, she also takes a blood thinner.  This was held for her surgery to reduce the risk of intraoperative bleeding and postoperative bleeding prior to her surgery.  She was made n.p.o. and taken to the operating room on February 12, 2016 for open reduction, internal fixation of her left wrist.  Please see the operative note for full details.  In the immediate recovery area, the patient tolerated the procedure well. Vital signs remained stable.  She desired and the support group surrounding her at home wanted to be discharged and therefore she was discharged after surgery on the 23rd with specific instructions regarding to the care of her upper extremity which included nonweightbearing, elevation of the extremity, moving her fingers, and she was given an appointment to see me back in the office in 1-2 weeks. She is to call for an appointment.  She is to resume all of her preoperative medications including her blood thinners as normal.     Dennie Bible, MD     HCC/MEDQ  D:  02/21/2016  T:  02/22/2016  Job:  [id number]

## 2016-02-28 DIAGNOSIS — S52532A Colles' fracture of left radius, initial encounter for closed fracture: Secondary | ICD-10-CM | POA: Diagnosis not present

## 2016-03-02 ENCOUNTER — Ambulatory Visit (INDEPENDENT_AMBULATORY_CARE_PROVIDER_SITE_OTHER): Payer: Commercial Managed Care - HMO

## 2016-03-02 ENCOUNTER — Ambulatory Visit (INDEPENDENT_AMBULATORY_CARE_PROVIDER_SITE_OTHER): Payer: Commercial Managed Care - HMO | Admitting: Internal Medicine

## 2016-03-02 ENCOUNTER — Encounter: Payer: Self-pay | Admitting: Internal Medicine

## 2016-03-02 VITALS — BP 132/72 | HR 63 | Temp 98.3°F | Ht 63.5 in | Wt 173.5 lb

## 2016-03-02 VITALS — BP 132/72 | HR 63 | Temp 98.3°F | Ht 63.5 in | Wt 173.0 lb

## 2016-03-02 DIAGNOSIS — E890 Postprocedural hypothyroidism: Secondary | ICD-10-CM

## 2016-03-02 DIAGNOSIS — S52502D Unspecified fracture of the lower end of left radius, subsequent encounter for closed fracture with routine healing: Secondary | ICD-10-CM

## 2016-03-02 DIAGNOSIS — I4891 Unspecified atrial fibrillation: Secondary | ICD-10-CM | POA: Diagnosis not present

## 2016-03-02 DIAGNOSIS — Z7901 Long term (current) use of anticoagulants: Secondary | ICD-10-CM

## 2016-03-02 DIAGNOSIS — Z853 Personal history of malignant neoplasm of breast: Secondary | ICD-10-CM

## 2016-03-02 DIAGNOSIS — Z23 Encounter for immunization: Secondary | ICD-10-CM

## 2016-03-02 DIAGNOSIS — Z0000 Encounter for general adult medical examination without abnormal findings: Secondary | ICD-10-CM

## 2016-03-02 DIAGNOSIS — M810 Age-related osteoporosis without current pathological fracture: Secondary | ICD-10-CM | POA: Diagnosis not present

## 2016-03-02 DIAGNOSIS — I10 Essential (primary) hypertension: Secondary | ICD-10-CM

## 2016-03-02 NOTE — Progress Notes (Signed)
Provider:  Rexene Edison. Mariea Clonts, D.O., C.M.D. Location:   [facility]   Place of Service:   clinic  Previous PCP: Hollace Kinnier, DO Patient Care Team: Gayland Curry, DO as PCP - General (Geriatric Medicine) Sanda Klein, MD as Consulting Physician (Cardiology) Amy Martinique, MD as Consulting Physician (Dermatology)  Extended Emergency Contact Information Primary Emergency Contact: [place] of [place] Phone: [phone] Relation: Grandaughter Secondary Emergency Contact: Bunton,Jerry "Daphane Shepherd of [place] Phone: [phone] Mobile Phone: [phone] Relation: Brother  Code Status: DNR Goals of Care: Advanced Directive information Advanced Directives 03/02/2016  Does patient have an advance directive? Yes  Type of Advance Directive Living will;Healthcare Power of Attorney  Does patient want to make changes to advanced directive? No - Patient declined  Copy of advanced directive(s) in chart? No - copy requested   Chief Complaint  Patient presents with  . Annual Exam    medicare wellness    HPI: Patient is a 75 y.o. female seen today for an annual physical exam.She was seen by the nurse advisor and now I'm seeing her for her physical exam.    She was going down brick stairs between rooms of her home, lost her balance, tripped and broke her left wrist--had to have surgery.  Not that painful now.  Followed up Monday and xrays looked good.  Has a f/u in 1 month.  Sees him on 10/23.  Had cement floor with carpet.    Mood is doing fine.    Past Medical History:  Diagnosis Date  . Anxiety   . Atrial fibrillation with rapid ventricular response ([facility]) 07/07/2015  . Bilateral breast cancer ([facility])   . Daily headache   . Depression   . GERD (gastroesophageal reflux disease)   . Hyperlipidemia   . Hypertension   . Hypothyroidism   . Iron deficiency anemia 2000s  . Lumbago   . Nausea alone   . Senile osteoporosis   . Squamous cell carcinoma of  forehead    "I think they burned it off"   Past Surgical History:  Procedure Laterality Date  . ABDOMINAL HYSTERECTOMY  1970s  . BREAST BIOPSY Bilateral   . BREAST LUMPECTOMY Right 2002   "w/lymph nodes removed"  . CARDIOVERSION N/A 08/19/2015   Procedure: CARDIOVERSION;  Surgeon: Sanda Klein, MD;  Location: [facility];  Service: Cardiovascular;  Laterality: N/A;  . CATARACT EXTRACTION W/ INTRAOCULAR LENS  IMPLANT, BILATERAL Bilateral 2000s  . DILATION AND CURETTAGE OF UTERUS  ~ 1968  . ELBOW FRACTURE SURGERY Left 2011   "has 2 screws in it"  . EXCISIONAL HEMORRHOIDECTOMY  ~ 1970  . FRACTURE SURGERY  02/12/2016   Right Wrist  . HEMIARTHROPLASTY HIP Left 2011   "had ball replaced"  . INGUINAL HERNIA REPAIR Right 1980s?  [name] MASTECTOMY Left 2005  . ORIF WRIST FRACTURE Left 02/12/2016   Procedure: OPEN REDUCTION INTERNAL FIXATION LEFT DISTAL RADIUS;  Surgeon: Dayna Barker, MD;  Location: [facility];  Service: Plastics;  Laterality: Left;  . TOTAL THYROIDECTOMY  1972  . WRIST FRACTURE SURGERY Right    "has a plate and 7 screws in there"    reports that she has never smoked. She has never used smokeless tobacco. She reports that she does not drink alcohol or use drugs.  Functional Status Survey:  see AWV by Delrae Rend  Family History  Problem Relation Age of Onset  . Cancer Father   . Heart attack Brother   . Mental illness Son   .  Other Brother   . Other Brother   . Cancer Sister   . Heart disease Sister   . Hypertension Sister   . Other Brother   . Diabetes Brother   . COPD Brother   . Mental illness Son     Health Maintenance  Topic Date Due  . Samul Dada  01/24/2020  . INFLUENZA VACCINE  Completed  . DEXA SCAN  Completed  . ZOSTAVAX  Completed  . PNA vac Low Risk Adult  Completed    No Known Allergies    Medication List       Accurate as of 03/02/16  2:05 PM. Always use your most recent med list.          acetaminophen 325 MG tablet Commonly known as:   TYLENOL Take 2 tablets (650 mg total) by mouth every 4 (four) hours as needed for headache or mild pain.   alendronate 70 MG tablet Commonly known as:  FOSAMAX Take one tablet by mouth weekly with 8 ounces water, stay upright for 30 minutes after taking, For Osteoporosis   ALPRAZolam 0.5 MG tablet Commonly known as:  XANAX Take 1 tablet (0.5 mg total) by mouth daily. For anxiety   apixaban 5 MG Tabs tablet Commonly known as:  ELIQUIS Take 1 tablet (5 mg total) by mouth 2 (two) times daily.   levothyroxine 137 MCG tablet Commonly known as:  SYNTHROID, LEVOTHROID Take one tablet by mouth 30 minutes before breakfast once daily for thyroid   metoprolol 50 MG tablet Commonly known as:  LOPRESSOR Take 1 tablet (50 mg total) by mouth 2 (two) times daily.   oxyCODONE-acetaminophen 5-325 MG tablet Commonly known as:  ROXICET Take 1-2 tablets by mouth every 4 (four) hours as needed for severe pain.   pravastatin 40 MG tablet Commonly known as:  PRAVACHOL Take 1 tablet by mouth every evening with meal   ranitidine 75 MG tablet Commonly known as:  ZANTAC Take 75 mg by mouth 2 (two) times daily as needed (For Heartburn).   sertraline 25 MG tablet Commonly known as:  ZOLOFT Take 2 tablets (50 mg total) by mouth daily.   triamterene-hydrochlorothiazide 37.5-25 MG tablet Commonly known as:  MAXZIDE-25 Take 0.5 tablets by mouth daily.   Vitamin D3 2000 units Tabs Take 1 tablet by mouth daily.       Review of Systems  Constitutional: Negative for chills, fever, malaise/fatigue and weight loss.  HENT: Negative for hearing loss.   Eyes: Negative for blurred vision.       Glasses  Respiratory: Negative for cough and shortness of breath.   Cardiovascular: Negative for chest pain, palpitations and leg swelling.  Gastrointestinal: Negative for abdominal pain, blood in stool, constipation and melena.  Genitourinary: Negative for dysuria.  Musculoskeletal: Positive for falls and joint  pain.  Skin: Negative for itching and rash.  Neurological: Negative for dizziness, loss of consciousness, weakness and headaches.  Endo/Heme/Allergies: Does not bruise/bleed easily.  Psychiatric/Behavioral: Positive for depression. Negative for memory loss.    Vitals:   03/02/16 1357  BP: 132/72  Pulse: 63  Temp: 98.3 F (36.8 C)  TempSrc: Oral  SpO2: 95%  Weight: 173 lb (78.5 kg)  Height: 5' 3.5" (1.613 m)   Body mass index is 30.16 kg/m. Physical Exam  Constitutional: She is oriented to person, place, and time. She appears well-developed and well-nourished. No distress.  HENT:  Head: Normocephalic and atraumatic.  Right Ear: External ear normal.  Left Ear: External ear normal.  Nose: Nose normal.  Mouth/Throat: Oropharynx is clear and moist. No oropharyngeal exudate.  Eyes: Conjunctivae and EOM are normal. Pupils are equal, round, and reactive to light.  glasses  Neck: Normal range of motion. Neck supple. No JVD present.  Cardiovascular: Intact distal pulses.  Exam reveals no gallop and no friction rub.   No murmur heard. irreg irreg  Pulmonary/Chest: Effort normal and breath sounds normal. No respiratory distress. Right breast exhibits no inverted nipple, no mass, no nipple discharge, no skin change and no tenderness.  Left mastectomy  Abdominal: Soft. Bowel sounds are normal. She exhibits no distension and no mass. There is no tenderness. There is no rebound and no guarding. No hernia.  Musculoskeletal:  Left wrist in soft cast/wrap, some mild swelling of fingers  Lymphadenopathy:    She has no cervical adenopathy.  Neurological: She is alert and oriented to person, place, and time. She has normal reflexes. No cranial nerve deficit.  Skin: Skin is warm and dry. Capillary refill takes less than 2 seconds.  Psychiatric: Her behavior is normal. Judgment and thought content normal.  Flat affect    Labs reviewed: Basic Metabolic Panel:  Recent Labs  11/22/15 0858  02/11/16 0713 02/12/16 0514  NA 142 134* 134*  K 4.4 3.7 4.4  CL 101 100* 98*  CO2 26 24 30   GLUCOSE 86 115* 120*  BUN 22 16 11   CREATININE 1.07* 1.01* 0.92  CALCIUM 8.8 8.9 8.7*   Liver Function Tests:  Recent Labs  11/22/15 0858  AST 18  ALT 11  ALKPHOS 59  BILITOT 0.5  PROT 6.1  ALBUMIN 3.9   No results for input(s): LIPASE, AMYLASE in the last 8760 hours. No results for input(s): AMMONIA in the last 8760 hours. CBC:  Recent Labs  08/11/15 1156 11/22/15 0858 02/11/16 0713 02/12/16 0514  WBC 7.8 6.8 10.8* 9.9  NEUTROABS  --  3.2  --   --   HGB 13.9  --  12.8 12.0  HCT 43.1 41.2 40.2 39.2  MCV 86.0 86 87.2 89.3  PLT 186 153 163 129*   Cardiac Enzymes:  Recent Labs  07/07/15 1143 07/07/15 1626 07/07/15 2345  TROPONINI <0.03 <0.03 <0.03   BNP: Invalid input(s): POCBNP Lab Results  Component Value Date   HGBA1C 5.8 (H) 11/22/2015   Lab Results  Component Value Date   TSH 3.580 11/22/2015   No results found for: VITAMINB12 No results found for: FOLATE Lab Results  Component Value Date   IRON 14 (L) 10/07/2013   TIBC 427 10/07/2013   FERRITIN 7 (L) 10/07/2013    Imaging and Procedures Recently: AWV reviewed.  Hospitalization and surgery reviewed.  Assessment/Plan 1. Annual physical exam -completed today, see above  2. Atrial fibrillation with rapid ventricular response (HCC) -rate now controlled with metoprolol 50mg  po bid -now on eliquis as anticoagulant  3. Essential hypertension, benign -bp elevated this am due to anxiety of appts and white coat syndrome, will monitor -cont lopressor and maxzide--encouraged hydration  4. Senile osteoporosis -now has left wrist fx, is on fosamax, needs bone density rechecked--if on fosamax more than 2 years, d/c and start prolia  5. HX: breast cancer -left mastectomy years ago, no problems since, right mammo 10/16 wnl  6. Closed fracture of distal end of left radius with routine healing,  unspecified fracture morphology, subsequent encounter -keep f/u with orthopedics and cont therapeutic exercises   7. Chronic anticoagulation -on eliquis, has had several labs while hospitalized  8. Postoperative  hypothyroidism -cont levothyroxine 11mcg daily before breakfast  Labs/tests ordered:  No orders of the defined types were placed in this encounter.    Atalia Litzinger L. Elek Holderness, D.O. [facility] [address], [place] [address] Cell Phone (Mon-Fri 8am-5pm):  [phone] On Call:  [phone] & follow prompts after 5pm & weekends Office Phone:  [phone] Office Fax:  [phone]

## 2016-03-02 NOTE — Patient Instructions (Signed)
Deborah Randall , Thank you for taking time to come for your Medicare Wellness Visit. I appreciate your ongoing commitment to your health goals. Please review the following plan we discussed and let me know if I can assist you in the future.   These are the goals we discussed: Goals    . Increase water intake          Starting 03/02/16, I will attempt to drink 3 bottles of water per day, instead of 1-2 bottles.       This is a list of the screening recommended for you and due dates:  Health Maintenance  Topic Date Due  . Tetanus Vaccine  01/24/2020  . Flu Shot  Completed  . DEXA scan (bone density measurement)  Completed  . Shingles Vaccine  Completed  . Pneumonia vaccines  Completed   Preventive Care for Adults  A healthy lifestyle and preventive care can promote health and wellness. Preventive health guidelines for adults include the following key practices.  . A routine yearly physical is a good way to check with your health care provider about your health and preventive screening. It is a chance to share any concerns and updates on your health and to receive a thorough exam.  . Visit your dentist for a routine exam and preventive care every 6 months. Brush your teeth twice a day and floss once a day. Good oral hygiene prevents tooth decay and gum disease.  . The frequency of eye exams is based on your age, health, family medical history, use  of contact lenses, and other factors. Follow your health care provider's ecommendations for frequency of eye exams.  . Eat a healthy diet. Foods like vegetables, fruits, whole grains, low-fat dairy products, and lean protein foods contain the nutrients you need without too many calories. Decrease your intake of foods high in solid fats, added sugars, and salt. Eat the right amount of calories for you. Get information about a proper diet from your health care provider, if necessary.  . Regular physical exercise is one of the most important things  you can do for your health. Most adults should get at least 150 minutes of moderate-intensity exercise (any activity that increases your heart rate and causes you to sweat) each week. In addition, most adults need muscle-strengthening exercises on 2 or more days a week.  Silver Sneakers may be a benefit available to you. To determine eligibility, you may visit the website: www.silversneakers.com or contact program at 4134095508 Mon-Fri between 8AM-8PM.   . Maintain a healthy weight. The body mass index (BMI) is a screening tool to identify possible weight problems. It provides an estimate of body fat based on height and weight. Your health care provider can find your BMI and can help you achieve or maintain a healthy weight.   For adults 20 years and older: ? A BMI below 18.5 is considered underweight. ? A BMI of 18.5 to 24.9 is normal. ? A BMI of 25 to 29.9 is considered overweight. ? A BMI of 30 and above is considered obese.   . Maintain normal blood lipids and cholesterol levels by exercising and minimizing your intake of saturated fat. Eat a balanced diet with plenty of fruit and vegetables. Blood tests for lipids and cholesterol should begin at age 63 and be repeated every 5 years. If your lipid or cholesterol levels are high, you are over 50, or you are at high risk for heart disease, you may need your cholesterol levels  checked more frequently. Ongoing high lipid and cholesterol levels should be treated with medicines if diet and exercise are not working.  . If you smoke, find out from your health care provider how to quit. If you do not use tobacco, please do not start.  . If you choose to drink alcohol, please do not consume more than 2 drinks per day. One drink is considered to be 12 ounces (355 mL) of beer, 5 ounces (148 mL) of wine, or 1.5 ounces (44 mL) of liquor.  . If you are 71-50 years old, ask your health care provider if you should take aspirin to prevent strokes.  . Use  sunscreen. Apply sunscreen liberally and repeatedly throughout the day. You should seek shade when your shadow is shorter than you. Protect yourself by wearing long sleeves, pants, a wide-brimmed hat, and sunglasses year round, whenever you are outdoors.  . Once a month, do a whole body skin exam, using a mirror to look at the skin on your back. Tell your health care provider of new moles, moles that have irregular borders, moles that are larger than a pencil eraser, or moles that have changed in shape or color.

## 2016-03-02 NOTE — Progress Notes (Signed)
Subjective:   Deborah Randall is a 75 y.o. female who presents for Medicare Annual (Subsequent) preventive examination.  Review of Systems:  N/A Cardiac Risk Factors include: advanced age (>55men, >82 women)     Objective:     Vitals: BP 132/72 (BP Location: Right Arm, Patient Position: Sitting, Cuff Size: Normal)   Pulse 63   Temp 98.3 F (36.8 C) (Oral)   Ht 5' 3.5" (1.613 m)   Wt 173 lb 8 oz (78.7 kg)   SpO2 95%   BMI 30.25 kg/m   Body mass index is 30.25 kg/m.   Tobacco History  Smoking Status  . Never Smoker  Smokeless Tobacco  . Never Used     Counseling given: No   Past Medical History:  Diagnosis Date  . Anxiety   . Atrial fibrillation with rapid ventricular response ([facility]) 07/07/2015  . Bilateral breast cancer ([facility])   . Daily headache   . Depression   . GERD (gastroesophageal reflux disease)   . Hyperlipidemia   . Hypertension   . Hypothyroidism   . Iron deficiency anemia 2000s  . Lumbago   . Nausea alone   . Senile osteoporosis   . Squamous cell carcinoma of forehead    "I think they burned it off"   Past Surgical History:  Procedure Laterality Date  . ABDOMINAL HYSTERECTOMY  1970s  . BREAST BIOPSY Bilateral   . BREAST LUMPECTOMY Right 2002   "w/lymph nodes removed"  . CARDIOVERSION N/A 08/19/2015   Procedure: CARDIOVERSION;  Surgeon: Sanda Klein, MD;  Location: [facility];  Service: Cardiovascular;  Laterality: N/A;  . CATARACT EXTRACTION W/ INTRAOCULAR LENS  IMPLANT, BILATERAL Bilateral 2000s  . DILATION AND CURETTAGE OF UTERUS  ~ 1968  . ELBOW FRACTURE SURGERY Left 2011   "has 2 screws in it"  . EXCISIONAL HEMORRHOIDECTOMY  ~ 1970  . FRACTURE SURGERY  02/12/2016   Right Wrist  . HEMIARTHROPLASTY HIP Left 2011   "had ball replaced"  . INGUINAL HERNIA REPAIR Right 1980s?  [name] MASTECTOMY Left 2005  . ORIF WRIST FRACTURE Left 02/12/2016   Procedure: OPEN REDUCTION INTERNAL FIXATION LEFT DISTAL RADIUS;  Surgeon: Dayna Barker, MD;   Location: [facility];  Service: Plastics;  Laterality: Left;  . TOTAL THYROIDECTOMY  1972  . WRIST FRACTURE SURGERY Right    "has a plate and 7 screws in there"   Family History  Problem Relation Age of Onset  . Cancer Father   . Heart attack Brother   . Mental illness Son   . Other Brother   . Other Brother   . Cancer Sister   . Heart disease Sister   . Hypertension Sister   . Other Brother   . Diabetes Brother   . COPD Brother   . Mental illness Son    History  Sexual Activity  . Sexual activity: No    Outpatient Encounter Prescriptions as of 03/02/2016  Medication Sig  . acetaminophen (TYLENOL) 325 MG tablet Take 2 tablets (650 mg total) by mouth every 4 (four) hours as needed for headache or mild pain.  [name] alendronate (FOSAMAX) 70 MG tablet Take one tablet by mouth weekly with 8 ounces water, stay upright for 30 minutes after taking, For Osteoporosis (Patient taking differently: Take 70 mg by mouth once a week. Saturday)  . ALPRAZolam (XANAX) 0.5 MG tablet Take 1 tablet (0.5 mg total) by mouth daily. For anxiety  . apixaban (ELIQUIS) 5 MG TABS tablet Take 1 tablet (5 mg  total) by mouth 2 (two) times daily.  . Cholecalciferol (VITAMIN D3) 2000 UNITS TABS Take 1 tablet by mouth daily.   [name] levothyroxine (SYNTHROID, LEVOTHROID) 137 MCG tablet Take one tablet by mouth 30 minutes before breakfast once daily for thyroid  . metoprolol (LOPRESSOR) 50 MG tablet Take 1 tablet (50 mg total) by mouth 2 (two) times daily.  [name] oxyCODONE-acetaminophen (ROXICET) 5-325 MG tablet Take 1-2 tablets by mouth every 4 (four) hours as needed for severe pain.  . pravastatin (PRAVACHOL) 40 MG tablet Take 1 tablet by mouth every evening with meal  . ranitidine (ZANTAC) 75 MG tablet Take 75 mg by mouth 2 (two) times daily as needed (For Heartburn).   . sertraline (ZOLOFT) 25 MG tablet Take 2 tablets (50 mg total) by mouth daily.  [name] triamterene-hydrochlorothiazide (MAXZIDE-25) 37.5-25 MG tablet Take 0.5 tablets  by mouth daily.  . [DISCONTINUED] diphenhydrAMINE (SOMINEX) 25 MG tablet Take 25 mg by mouth at bedtime as needed for sleep. Take 1 tablets once daily   No facility-administered encounter medications on file as of 03/02/2016.     Activities of Daily Living In your present state of health, do you have any difficulty performing the following activities: 03/02/2016 02/12/2016  Hearing? N N  Vision? N N  Difficulty concentrating or making decisions? N N  Walking or climbing stairs? N N  Dressing or bathing? N N  Doing errands, shopping? N N  Preparing Food and eating ? N -  Using the Toilet? N -  In the past six months, have you accidently leaked urine? N -  Do you have problems with loss of bowel control? N -  Managing your Medications? Y -  Managing your Finances? Y -  Housekeeping or managing your Housekeeping? Y -  Some recent data might be hidden    Patient Care Team: Gayland Curry, DO as PCP - General (Geriatric Medicine) Sanda Klein, MD as Consulting Physician (Cardiology) Amy Martinique, MD as Consulting Physician (Dermatology)    Assessment:     Exercise Activities and Dietary recommendations Current Exercise Habits: The patient does not participate in regular exercise at present, Type of exercise: Other - see comments (Works at the [employer], 1-2 days per week), Intensity: Not Applicable  Goals    . Increase water intake          Starting 03/02/16, I will attempt to drink 3 bottles of water per day, instead of 1-2 bottles.      Fall Risk Fall Risk  03/02/2016 11/22/2015 02/19/2015 08/03/2014 07/02/2014  Falls in the past year? Yes No No No No  Number falls in past yr: 1 - - - -  Injury with Fall? Yes - - - -  Risk for fall due to : Impaired vision - - - -  Follow up Falls evaluation completed;Education provided - - - -   Depression Screen PHQ 2/9 Scores 03/02/2016 11/22/2015 02/19/2015 07/02/2014  PHQ - 2 Score - 0 0 0  Exception Documentation  Medical reason - - -     Cognitive Testing MMSE - Mini Mental State Exam 03/02/2016 02/19/2015 09/25/2013  Orientation to time 5 5 5   Orientation to Place 5 5 5   Registration 3 3 3   Attention/ Calculation 5 5 5   Recall 3 3 2   Language- name 2 objects 2 2 2   Language- repeat 1 1 1   Language- follow 3 step command 3 3 3   Language- read & follow direction 1 1 1  Write a sentence 1 1 1   Copy design - 1 1  Total score - 30 29   **Passed Clock Test** Immunization History  Administered Date(s) Administered  . Influenza,inj,Quad PF,36+ Mos 03/14/2013, 03/26/2014, 02/19/2015  . Pneumococcal Conjugate-13 08/02/2013, 08/03/2014  . Pneumococcal Polysaccharide-23 05/22/2009  . Zoster 09/25/2013   Screening Tests Health Maintenance  Topic Date Due  . TETANUS/TDAP  01/24/2020  . INFLUENZA VACCINE  Completed  . DEXA SCAN  Completed  . ZOSTAVAX  Completed  . PNA vac Low Risk Adult  Completed      Plan:    During the course of the visit the patient was educated and counseled about the following appropriate screening and preventive services:   Vaccines to include Pneumoccal, Influenza, Hepatitis B, Td, Zostavax, HCV  Electrocardiogram  Cardiovascular Disease  Colorectal cancer screening  Bone density screening  Diabetes screening  Glaucoma screening  Mammography/PAP  Nutrition counseling   Patient Instructions (the written plan) was given to the patient.   Allyn Kenner, LPN  [id number]

## 2016-03-06 ENCOUNTER — Other Ambulatory Visit: Payer: Self-pay | Admitting: *Deleted

## 2016-03-06 DIAGNOSIS — E038 Other specified hypothyroidism: Secondary | ICD-10-CM

## 2016-03-06 MED ORDER — LEVOTHYROXINE SODIUM 137 MCG PO TABS: ORAL_TABLET | ORAL | 1 refills | Status: DC

## 2016-03-06 NOTE — Telephone Encounter (Signed)
[facility] 

## 2016-03-13 DIAGNOSIS — S52532D Colles' fracture of left radius, subsequent encounter for closed fracture with routine healing: Secondary | ICD-10-CM | POA: Diagnosis not present

## 2016-03-15 ENCOUNTER — Encounter: Payer: Self-pay | Admitting: Cardiovascular Disease

## 2016-03-15 ENCOUNTER — Ambulatory Visit (INDEPENDENT_AMBULATORY_CARE_PROVIDER_SITE_OTHER): Payer: Commercial Managed Care - HMO | Admitting: Cardiovascular Disease

## 2016-03-15 VITALS — BP 142/72 | HR 56 | Ht 63.5 in | Wt 174.4 lb

## 2016-03-15 DIAGNOSIS — Z7901 Long term (current) use of anticoagulants: Secondary | ICD-10-CM

## 2016-03-15 DIAGNOSIS — W19XXXS Unspecified fall, sequela: Secondary | ICD-10-CM

## 2016-03-15 DIAGNOSIS — I481 Persistent atrial fibrillation: Secondary | ICD-10-CM

## 2016-03-15 DIAGNOSIS — E785 Hyperlipidemia, unspecified: Secondary | ICD-10-CM | POA: Diagnosis not present

## 2016-03-15 DIAGNOSIS — I10 Essential (primary) hypertension: Secondary | ICD-10-CM | POA: Diagnosis not present

## 2016-03-15 DIAGNOSIS — I4819 Other persistent atrial fibrillation: Secondary | ICD-10-CM

## 2016-03-15 NOTE — Patient Instructions (Signed)
Dr Croitoru recommends that you schedule a follow-up appointment in 1 year. You will receive a reminder letter in the mail two months in advance. If you don't receive a letter, please call our office to schedule the follow-up appointment.  If you need a refill on your cardiac medications before your next appointment, please call your pharmacy. 

## 2016-03-15 NOTE — Progress Notes (Signed)
Patient ID: Deborah Randall, female   DOB: 28-Oct-1940, 75 y.o.   MRN: [id number]    Cardiology Office Note    Date:  03/15/2016   ID:  Deborah Randall, DOB 27-Oct-1940, MRN [id number]  PCP:  Hollace Kinnier, DO  Cardiologist:  Sanda Klein, MD   Chief Complaint  Patient presents with  . Follow-up    patient reports no complaints    History of Present Illness:  Deborah Randall is a 75 y.o. female with recurrent but infrequent paroxysmal atrial fibrillation with rapid ventricular response, initially diagnosed in 2011, s/p cardioversion performed on August 19, 2015. She felt much better after return to normal rhythm, with more energy. She has not had any bleeding complications and is compliant with anticoagulation. She did have a mechanical fall tripping down a couple of steps in her home and suffered a left radial fracture that required surgery. She denies any interim problems with palpitations, dizziness, syncope, dyspnea or angina. She has mild chronic ankle edema. She has well treated hypertension and hypothyroidism and hypercholesterolemia. She has a remote history of left mastectomy and radiation therapy 15 years ago. She has preserved left ventricular systolic function and a moderately dilated left atrium.   Past Medical History:  Diagnosis Date  . Anxiety   . Atrial fibrillation with rapid ventricular response ([place]) 07/07/2015  . Bilateral breast cancer ([place])   . Daily headache   . Depression   . GERD (gastroesophageal reflux disease)   . Hyperlipidemia   . Hypertension   . Hypothyroidism   . Iron deficiency anemia 2000s  . Lumbago   . Nausea alone   . Senile osteoporosis   . Squamous cell carcinoma of forehead    "I think they burned it off"    Past Surgical History:  Procedure Laterality Date  . ABDOMINAL HYSTERECTOMY  1970s  . BREAST BIOPSY Bilateral   . BREAST LUMPECTOMY Right 2002   "w/lymph nodes removed"  . CARDIOVERSION N/A 08/19/2015   Procedure: CARDIOVERSION;  Surgeon:  Sanda Klein, MD;  Location: [facility] [facility];  Service: Cardiovascular;  Laterality: N/A;  . CATARACT EXTRACTION W/ INTRAOCULAR LENS  IMPLANT, BILATERAL Bilateral 2000s  . DILATION AND CURETTAGE OF UTERUS  ~ 1968  . ELBOW FRACTURE SURGERY Left 2011   "has 2 screws in it"  . EXCISIONAL HEMORRHOIDECTOMY  ~ 1970  . FRACTURE SURGERY  02/12/2016   Right Wrist  . HEMIARTHROPLASTY HIP Left 2011   "had ball replaced"  . INGUINAL HERNIA REPAIR Right 1980s?  [name] MASTECTOMY Left 2005  . ORIF WRIST FRACTURE Left 02/12/2016   Procedure: OPEN REDUCTION INTERNAL FIXATION LEFT DISTAL RADIUS;  Surgeon: Dayna Barker, MD;  Location: [facility];  Service: Plastics;  Laterality: Left;  . TOTAL THYROIDECTOMY  1972  . WRIST FRACTURE SURGERY Right    "has a plate and 7 screws in there"    Current Medications: Outpatient Medications Prior to Visit  Medication Sig Dispense Refill  . acetaminophen (TYLENOL) 325 MG tablet Take 2 tablets (650 mg total) by mouth every 4 (four) hours as needed for headache or mild pain.    [name] alendronate (FOSAMAX) 70 MG tablet Take one tablet by mouth weekly with 8 ounces water, stay upright for 30 minutes after taking, For Osteoporosis (Patient taking differently: Take 70 mg by mouth once a week. Saturday) 4 tablet 6  . ALPRAZolam (XANAX) 0.5 MG tablet Take 1 tablet (0.5 mg total) by mouth daily. For anxiety 90 tablet 1  . apixaban (ELIQUIS)  5 MG TABS tablet Take 1 tablet (5 mg total) by mouth 2 (two) times daily. 60 tablet 11  . Cholecalciferol (VITAMIN D3) 2000 UNITS TABS Take 1 tablet by mouth daily.     [name] levothyroxine (SYNTHROID, LEVOTHROID) 137 MCG tablet Take one tablet by mouth 30 minutes before breakfast once daily for thyroid 30 tablet 1  . metoprolol (LOPRESSOR) 50 MG tablet Take 1 tablet (50 mg total) by mouth 2 (two) times daily. 60 tablet 11  . oxyCODONE-acetaminophen (ROXICET) 5-325 MG tablet Take 1-2 tablets by mouth every 4 (four) hours as needed for severe pain. 30 tablet 0    . pravastatin (PRAVACHOL) 40 MG tablet Take 1 tablet by mouth every evening with meal 30 tablet 5  . ranitidine (ZANTAC) 75 MG tablet Take 75 mg by mouth 2 (two) times daily as needed (For Heartburn).     . sertraline (ZOLOFT) 25 MG tablet Take 2 tablets (50 mg total) by mouth daily. 180 tablet 1  . triamterene-hydrochlorothiazide (MAXZIDE-25) 37.5-25 MG tablet Take 0.5 tablets by mouth daily. 30 tablet 3   No facility-administered medications prior to visit.      Allergies:   Review of patient's allergies indicates no known allergies.   Social History   Social History  . Marital status: Widowed    Spouse name: N/A  . Number of children: N/A  . Years of education: N/A   Occupational History  . 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 Smoking status: Never Smoker  . Smokeless tobacco: Never Used  . Alcohol use No  . Drug use: No  . Sexual activity: No   Other Topics Concern  . None   Social History Narrative   Lives alone   Widow   2 sons , one son decease, one son disable    Works at [employer] in [employer]   Enjoys going places with friends   Never smoked   Alcohol none   Exercise -walk, works in yard      Family History:  The patient's family history includes COPD in her brother; Cancer in her father and sister; Diabetes in her brother; Heart attack in her brother; Heart disease in her sister; Hypertension in her sister; Mental illness in her son and son; Other in her brother, brother, and brother.   ROS:   Please see the history of present illness.    ROS All other systems reviewed and are negative.   PHYSICAL EXAM:   VS:  BP (!) 142/72   Pulse (!) 56   Ht 5' 3.5" (1.613 m)   Wt 174 lb 6.4 oz (79.1 kg)   BMI 30.41 kg/m    GEN: Well nourished, well developed, in no acute distress  HEENT: normal  Neck: no JVD, carotid bruits, or masses Cardiac: RRR; no murmurs, rubs, or gallops,no edema  Respiratory:  clear to  auscultation bilaterally, normal work of breathing GI: soft, nontender, nondistended, + BS MS: no deformity or atrophy, the left wrist is slightly swollen compared to the right  Skin: warm and dry, no rash Neuro:  Alert and Oriented x 3, Strength and sensation are intact Psych: euthymic mood, full affect  Wt Readings from Last 3 Encounters:  03/15/16 174 lb 6.4 oz (79.1 kg)  03/02/16 173 lb (78.5 kg)  03/02/16 173 lb 8 oz (78.7 kg)      Studies/Labs Reviewed:   EKG:  EKG is ordered today.  The ekg ordered today  demonstrates Mild sinus bradycardia, QTC 424 ms  Recent Labs: 11/22/2015: ALT 11; TSH 3.580 02/12/2016: BUN 11; Creatinine, Ser 0.92; Hemoglobin 12.0; Platelets 129; Potassium 4.4; Sodium 134   Lipid Panel    Component Value Date/Time   CHOL 155 11/22/2015 0858   TRIG 122 11/22/2015 0858   HDL 46 11/22/2015 0858   CHOLHDL 3.4 11/22/2015 0858   CHOLHDL 3.1 07/08/2015 0535   VLDL 17 07/08/2015 0535   LDLCALC 85 11/22/2015 0858      ASSESSMENT:    1. Persistent atrial fibrillation ([place])   2. Chronic anticoagulation   3. Essential hypertension, benign   4. Hyperlipidemia with target LDL less than 100   5. Fall, sequela      PLAN:  In order of problems listed above:  1. AFib: No clinically detected recurrence of atrial fibrillation following elective cardioversion in March. Her episodes are infrequent and antiarrhythmic therapy does not appear to be justified at this time. Continued lifelong anticoagulation therapy is recommended (CHADSVasc score 4: age 80, gender, HTN). 2. Anticoagulation: No bleeding problem. She has only had 1 fall in the last several years. 3. HTN: Well controlled 4. HLP: good lipid profile on current statin regimen 5. Fall: Discussed ways to mitigate fall risks in her home. Asked her to report falls promptly and immediately seek medical attention if she has overt bleeding or any head injury since she is taking anticoagulant    Medication  Adjustments/Labs and Tests Ordered: Current medicines are reviewed at length with the patient today.  Concerns regarding medicines are outlined above.  Medication changes, Labs and Tests ordered today are listed in the Patient Instructions below. Patient Instructions  Dr Sallyanne Kuster recommends that you schedule a follow-up appointment in 1 year. You will receive a reminder letter in the mail two months in advance. If you don't receive a letter, please call our office to schedule the follow-up appointment.  If you need a refill on your cardiac medications before your next appointment, please call your pharmacy.    Signed, Sanda Klein, MD  03/15/2016 8:43 AM    [facility] [address], [place], [place]  [address] Phone: [phone]; Fax: [phone]

## 2016-04-04 ENCOUNTER — Other Ambulatory Visit: Payer: Self-pay | Admitting: *Deleted

## 2016-04-04 DIAGNOSIS — I10 Essential (primary) hypertension: Secondary | ICD-10-CM

## 2016-04-04 MED ORDER — TRIAMTERENE-HCTZ 37.5-25 MG PO TABS: 0.5000 | ORAL_TABLET | Freq: Every day | ORAL | 3 refills | Status: DC

## 2016-04-04 NOTE — Telephone Encounter (Signed)
[facility] 

## 2016-05-03 ENCOUNTER — Other Ambulatory Visit: Payer: Self-pay | Admitting: *Deleted

## 2016-05-03 DIAGNOSIS — E038 Other specified hypothyroidism: Secondary | ICD-10-CM

## 2016-05-03 MED ORDER — LEVOTHYROXINE SODIUM 137 MCG PO TABS: ORAL_TABLET | ORAL | 1 refills | Status: DC

## 2016-05-03 NOTE — Telephone Encounter (Signed)
[facility] 

## 2016-06-12 ENCOUNTER — Other Ambulatory Visit: Payer: Self-pay | Admitting: *Deleted

## 2016-06-12 MED ORDER — PRAVASTATIN SODIUM 40 MG PO TABS: ORAL_TABLET | ORAL | 1 refills | Status: DC

## 2016-06-12 NOTE — Telephone Encounter (Signed)
[facility] 

## 2016-07-05 ENCOUNTER — Other Ambulatory Visit: Payer: Self-pay | Admitting: *Deleted

## 2016-07-05 DIAGNOSIS — E038 Other specified hypothyroidism: Secondary | ICD-10-CM

## 2016-07-05 MED ORDER — LEVOTHYROXINE SODIUM 137 MCG PO TABS: ORAL_TABLET | ORAL | 1 refills | Status: DC

## 2016-07-05 NOTE — Telephone Encounter (Signed)
[facility] 

## 2016-07-07 ENCOUNTER — Other Ambulatory Visit: Payer: Self-pay | Admitting: *Deleted

## 2016-07-10 ENCOUNTER — Other Ambulatory Visit: Payer: Self-pay | Admitting: *Deleted

## 2016-07-10 MED ORDER — METOPROLOL TARTRATE 50 MG PO TABS: 50.0000 mg | ORAL_TABLET | Freq: Two times a day (BID) | ORAL | 6 refills | Status: DC

## 2016-07-10 MED ORDER — APIXABAN 5 MG PO TABS: 5.0000 mg | ORAL_TABLET | Freq: Two times a day (BID) | ORAL | 6 refills | Status: DC

## 2016-07-10 MED ORDER — ALPRAZOLAM 0.5 MG PO TABS: 0.5000 mg | ORAL_TABLET | Freq: Every day | ORAL | 1 refills | Status: DC

## 2016-07-10 NOTE — Telephone Encounter (Signed)
Patient requested refill. Printed and faxed to pharmacy.

## 2016-07-18 DIAGNOSIS — L821 Other seborrheic keratosis: Secondary | ICD-10-CM | POA: Diagnosis not present

## 2016-07-18 DIAGNOSIS — L820 Inflamed seborrheic keratosis: Secondary | ICD-10-CM | POA: Diagnosis not present

## 2016-07-18 DIAGNOSIS — Z85828 Personal history of other malignant neoplasm of skin: Secondary | ICD-10-CM | POA: Diagnosis not present

## 2016-07-18 DIAGNOSIS — L570 Actinic keratosis: Secondary | ICD-10-CM | POA: Diagnosis not present

## 2016-07-18 DIAGNOSIS — D225 Melanocytic nevi of trunk: Secondary | ICD-10-CM | POA: Diagnosis not present

## 2016-07-18 DIAGNOSIS — D1801 Hemangioma of skin and subcutaneous tissue: Secondary | ICD-10-CM | POA: Diagnosis not present

## 2016-08-21 ENCOUNTER — Other Ambulatory Visit: Payer: Self-pay | Admitting: *Deleted

## 2016-08-21 DIAGNOSIS — F3289 Other specified depressive episodes: Secondary | ICD-10-CM

## 2016-08-21 MED ORDER — SERTRALINE HCL 25 MG PO TABS: 50.0000 mg | ORAL_TABLET | Freq: Every day | ORAL | 1 refills | Status: DC

## 2016-08-21 NOTE — Telephone Encounter (Signed)
[facility] 

## 2016-08-31 ENCOUNTER — Encounter: Payer: Self-pay | Admitting: Internal Medicine

## 2016-08-31 ENCOUNTER — Ambulatory Visit (INDEPENDENT_AMBULATORY_CARE_PROVIDER_SITE_OTHER): Payer: Medicare HMO | Admitting: Internal Medicine

## 2016-08-31 VITALS — BP 110/60 | HR 56 | Temp 98.0°F | Wt 176.0 lb

## 2016-08-31 DIAGNOSIS — Z7901 Long term (current) use of anticoagulants: Secondary | ICD-10-CM

## 2016-08-31 DIAGNOSIS — E785 Hyperlipidemia, unspecified: Secondary | ICD-10-CM

## 2016-08-31 DIAGNOSIS — E890 Postprocedural hypothyroidism: Secondary | ICD-10-CM | POA: Diagnosis not present

## 2016-08-31 DIAGNOSIS — M810 Age-related osteoporosis without current pathological fracture: Secondary | ICD-10-CM | POA: Diagnosis not present

## 2016-08-31 DIAGNOSIS — I10 Essential (primary) hypertension: Secondary | ICD-10-CM | POA: Diagnosis not present

## 2016-08-31 DIAGNOSIS — I4891 Unspecified atrial fibrillation: Secondary | ICD-10-CM | POA: Diagnosis not present

## 2016-08-31 DIAGNOSIS — F3341 Major depressive disorder, recurrent, in partial remission: Secondary | ICD-10-CM | POA: Diagnosis not present

## 2016-08-31 NOTE — Progress Notes (Signed)
Location:  [facility] Provider:  Aldwin Micalizzi L. Mariea Clonts, D.O., C.M.D.  Code Status: full code Goals of Care:  Advanced Directives 08/31/2016  Does Patient Have a Medical Advance Directive? No  Type of Advance Directive -  Does patient want to make changes to medical advance directive? -  Copy of [place] in Chart? -  Would patient like information on creating a medical advance directive? No - Patient declined   Chief Complaint  Patient presents with  . Medical Management of Chronic Issues    67mth follow-up    HPI: Patient is a 76 y.o. female seen today for medical management of chronic diseases.    No new things.  Is back to working at the [employer].  Had not had recurrent afib since cardioversion in March (as of October).  No palpitations or dizziness since either.  No antiarrhythmic was felt to be needed.  On eliquis.  No major bruises or bleeding.    No more falls.  Last labs sept '17.  Says spirits are good.  Her son is still sick with his bipolar, but she's trying to deal with it everyday.  Takes one xanax daily and on zoloft.  Very seldom has to take zantac for her GERD.  She's not sure what the triggers are.    BP well controlled on lopressor bid, maxzide.  Last lipid panel at goal in 9/17.  On pravachol.  TSH normal in July '17.  On levothyroxine 149mcg.    Osteoporosis: on vitamin D and was on fosamax.    Past Medical History:  Diagnosis Date  . Anxiety   . Atrial fibrillation with rapid ventricular response ([place]) 07/07/2015  . Bilateral breast cancer ([place])   . Daily headache   . Depression   . GERD (gastroesophageal reflux disease)   . Hyperlipidemia   . Hypertension   . Hypothyroidism   . Iron deficiency anemia 2000s  . Lumbago   . Nausea alone   . Senile osteoporosis   . Squamous cell carcinoma of forehead    "I think they burned it off"    Past Surgical History:  Procedure Laterality Date  . ABDOMINAL HYSTERECTOMY  1970s  .  BREAST BIOPSY Bilateral   . BREAST LUMPECTOMY Right 2002   "w/lymph nodes removed"  . CARDIOVERSION N/A 08/19/2015   Procedure: CARDIOVERSION;  Surgeon: Sanda Klein, MD;  Location: [place];  Service: Cardiovascular;  Laterality: N/A;  . CATARACT EXTRACTION W/ INTRAOCULAR LENS  IMPLANT, BILATERAL Bilateral 2000s  . DILATION AND CURETTAGE OF UTERUS  ~ 1968  . ELBOW FRACTURE SURGERY Left 2011   "has 2 screws in it"  . EXCISIONAL HEMORRHOIDECTOMY  ~ 1970  . FRACTURE SURGERY  02/12/2016   Right Wrist  . HEMIARTHROPLASTY HIP Left 2011   "had ball replaced"  . INGUINAL HERNIA REPAIR Right 1980s?  [name] MASTECTOMY Left 2005  . ORIF WRIST FRACTURE Left 02/12/2016   Procedure: OPEN REDUCTION INTERNAL FIXATION LEFT DISTAL RADIUS;  Surgeon: Dayna Barker, MD;  Location: [facility];  Service: Plastics;  Laterality: Left;  . TOTAL THYROIDECTOMY  1972  . WRIST FRACTURE SURGERY Right    "has a plate and 7 screws in there"    No Known Allergies  Allergies as of 08/31/2016   No Known Allergies     Medication List       Accurate as of 08/31/16 10:12 AM. Always use your most recent med list.  acetaminophen 325 MG tablet Commonly known as:  TYLENOL Take 2 tablets (650 mg total) by mouth every 4 (four) hours as needed for headache or mild pain.   ALPRAZolam 0.5 MG tablet Commonly known as:  XANAX Take 1 tablet (0.5 mg total) by mouth daily. For anxiety   apixaban 5 MG Tabs tablet Commonly known as:  ELIQUIS Take 1 tablet (5 mg total) by mouth 2 (two) times daily.   levothyroxine 137 MCG tablet Commonly known as:  SYNTHROID, LEVOTHROID Take one tablet by mouth 30 minutes before breakfast once daily for thyroid   metoprolol 50 MG tablet Commonly known as:  LOPRESSOR Take 1 tablet (50 mg total) by mouth 2 (two) times daily.   pravastatin 40 MG tablet Commonly known as:  PRAVACHOL Take 1 tablet by mouth every evening with meal   ranitidine 75 MG tablet Commonly known as:   ZANTAC Take 75 mg by mouth 2 (two) times daily as needed (For Heartburn).   sertraline 25 MG tablet Commonly known as:  ZOLOFT Take 2 tablets (50 mg total) by mouth daily.   triamterene-hydrochlorothiazide 37.5-25 MG tablet Commonly known as:  MAXZIDE-25 Take 0.5 tablets by mouth daily.   Vitamin D3 2000 units Tabs Take 1 tablet by mouth daily.       Review of Systems:  Review of Systems  Constitutional: Negative for chills and fever.  HENT: Negative for hearing loss.   Eyes: Negative for blurred vision.  Respiratory: Negative for shortness of breath.   Cardiovascular: Negative for chest pain and palpitations.  Gastrointestinal: Negative for abdominal pain, blood in stool, constipation, diarrhea and melena.  Genitourinary: Negative for dysuria, frequency and urgency.  Musculoskeletal: Positive for joint pain.       Left hip/thigh  Skin: Negative for itching and rash.  Neurological: Negative for dizziness and loss of consciousness.  Endo/Heme/Allergies: Does not bruise/bleed easily.  Psychiatric/Behavioral: Positive for depression. Negative for memory loss. The patient is nervous/anxious. The patient does not have insomnia.     Health Maintenance  Topic Date Due  . INFLUENZA VACCINE  12/20/2016  . TETANUS/TDAP  01/24/2020  . DEXA SCAN  Completed  . PNA vac Low Risk Adult  Completed    Physical Exam: Vitals:   08/31/16 1002  BP: 110/60  Pulse: (!) 56  Temp: 98 F (36.7 C)  TempSrc: Oral  SpO2: 96%  Weight: 176 lb (79.8 kg)   Body mass index is 30.69 kg/m. Physical Exam  Constitutional: She is oriented to person, place, and time. She appears well-developed and well-nourished. No distress.  Cardiovascular: Normal rate, regular rhythm, normal heart sounds and intact distal pulses.   Pulmonary/Chest: Effort normal and breath sounds normal. No respiratory distress.  Musculoskeletal:  Limping with left leg; right hip tender when lays on it  Neurological: She is  alert and oriented to person, place, and time.  Skin: Skin is warm and dry. Capillary refill takes less than 2 seconds.  Psychiatric: She has a normal mood and affect.    Labs reviewed: Basic Metabolic Panel:  Recent Labs  11/22/15 0858 02/11/16 0713 02/12/16 0514  NA 142 134* 134*  K 4.4 3.7 4.4  CL 101 100* 98*  CO2 26 24 30   GLUCOSE 86 115* 120*  BUN 22 16 11   CREATININE 1.07* 1.01* 0.92  CALCIUM 8.8 8.9 8.7*  TSH 3.580  --   --    Liver Function Tests:  Recent Labs  11/22/15 0858  AST 18  ALT 11  ALKPHOS 59  BILITOT 0.5  PROT 6.1  ALBUMIN 3.9   No results for input(s): LIPASE, AMYLASE in the last 8760 hours. No results for input(s): AMMONIA in the last 8760 hours. CBC:  Recent Labs  11/22/15 0858 02/11/16 0713 02/12/16 0514  WBC 6.8 10.8* 9.9  NEUTROABS 3.2  --   --   HGB  --  12.8 12.0  HCT 41.2 40.2 39.2  MCV 86 87.2 89.3  PLT 153 163 129*   Lipid Panel:  Recent Labs  11/22/15 0858  CHOL 155  HDL 46  LDLCALC 85  TRIG 122  CHOLHDL 3.4   Lab Results  Component Value Date   HGBA1C 5.8 (H) 11/22/2015   Assessment/Plan 1. Senile osteoporosis - was on fosamax in the past, but stopped, remains on vitamin D and does walking at [employer] for work - DG Bone Density; Future - COMPLETE METABOLIC PANEL WITH GFR; Future  2. Atrial fibrillation with rapid ventricular response ([facility]) -cont eliquis and lopressor  -f/u labs before next visit due to eliquis  3. Essential hypertension, benign - bp well controlled, cont same regimen and monitor, no dizziness - COMPLETE METABOLIC PANEL WITH GFR; Future  4. Postoperative hypothyroidism - cont levothyroxine therapy - TSH; Future  5. Chronic anticoagulation -continues on eliquis therapy and tolerating well - CBC with Differential/Platelet; Future  6. Hyperlipidemia with target LDL less than 100 -lipids at goal in sept last year, cont pravachol - Lipid panel; Future  7. Recurrent major  depressive disorder, in partial remission ([place]) -cont zoloft and daily xanax for anxiety due to caregiver stress (son with difficult to manage bipolar disorder)  Labs/tests ordered:   Orders Placed This Encounter  Procedures  . DG Bone Density    Standing Status:   Future    Standing Expiration Date:   10/31/2017    Scheduling Instructions:     After July    Order Specific Question:   Reason for Exam (SYMPTOM  OR DIAGNOSIS REQUIRED)    Answer:   senile osteoporosis, last one was 2012, on vitamin D, weightbearing exercise    Order Specific Question:   Preferred imaging location?    Answer:   [facility]  . CBC with Differential/Platelet    Standing Status:   Future    Standing Expiration Date:   05/02/2017  . COMPLETE METABOLIC PANEL WITH GFR    Standing Status:   Future    Standing Expiration Date:   05/02/2017  . Lipid panel    Standing Status:   Future    Standing Expiration Date:   05/02/2017  . TSH    Standing Status:   Future    Standing Expiration Date:   05/02/2017   Next appt: 01/05/2017 med mgt, labs and bone density before   Honestii Marton L. Maie Kesinger, D.O. [facility] [address], [place] [address] Cell Phone (Mon-Fri 8am-5pm):  [phone] On Call:  [phone] & follow prompts after 5pm & weekends Office Phone:  [phone] Office Fax:  [phone]

## 2016-09-06 ENCOUNTER — Other Ambulatory Visit: Payer: Self-pay | Admitting: *Deleted

## 2016-09-06 DIAGNOSIS — E038 Other specified hypothyroidism: Secondary | ICD-10-CM

## 2016-09-06 MED ORDER — LEVOTHYROXINE SODIUM 137 MCG PO TABS: ORAL_TABLET | ORAL | 3 refills | Status: DC

## 2016-09-06 NOTE — Telephone Encounter (Signed)
[facility] 

## 2016-10-23 ENCOUNTER — Encounter: Payer: Self-pay | Admitting: Internal Medicine

## 2016-12-04 ENCOUNTER — Other Ambulatory Visit: Payer: Self-pay | Admitting: *Deleted

## 2016-12-04 DIAGNOSIS — I10 Essential (primary) hypertension: Secondary | ICD-10-CM

## 2016-12-04 MED ORDER — TRIAMTERENE-HCTZ 37.5-25 MG PO TABS: 0.5000 | ORAL_TABLET | Freq: Every day | ORAL | 3 refills | Status: DC

## 2016-12-04 NOTE — Telephone Encounter (Signed)
[facility] 

## 2017-01-01 ENCOUNTER — Other Ambulatory Visit: Payer: Medicare HMO

## 2017-01-04 ENCOUNTER — Other Ambulatory Visit: Payer: Self-pay

## 2017-01-04 DIAGNOSIS — E038 Other specified hypothyroidism: Secondary | ICD-10-CM

## 2017-01-04 MED ORDER — ALPRAZOLAM 0.5 MG PO TABS: 0.5000 mg | ORAL_TABLET | Freq: Every day | ORAL | 0 refills | Status: DC

## 2017-01-04 MED ORDER — LEVOTHYROXINE SODIUM 137 MCG PO TABS: ORAL_TABLET | ORAL | 3 refills | Status: DC

## 2017-01-04 NOTE — Telephone Encounter (Signed)
Refill requests were received for alprazolam 0.5 mg tablet and levothyroxin 137 mcg. Rx were called into the pharmacy.

## 2017-01-05 ENCOUNTER — Ambulatory Visit: Payer: Medicare HMO | Admitting: Internal Medicine

## 2017-01-11 ENCOUNTER — Other Ambulatory Visit: Payer: Self-pay | Admitting: *Deleted

## 2017-01-11 MED ORDER — PRAVASTATIN SODIUM 40 MG PO TABS: ORAL_TABLET | ORAL | 1 refills | Status: DC

## 2017-01-11 NOTE — Telephone Encounter (Signed)
[facility] 

## 2017-01-15 DIAGNOSIS — Z85828 Personal history of other malignant neoplasm of skin: Secondary | ICD-10-CM | POA: Diagnosis not present

## 2017-01-15 DIAGNOSIS — D225 Melanocytic nevi of trunk: Secondary | ICD-10-CM | POA: Diagnosis not present

## 2017-01-15 DIAGNOSIS — D1801 Hemangioma of skin and subcutaneous tissue: Secondary | ICD-10-CM | POA: Diagnosis not present

## 2017-01-15 DIAGNOSIS — L821 Other seborrheic keratosis: Secondary | ICD-10-CM | POA: Diagnosis not present

## 2017-01-15 DIAGNOSIS — L570 Actinic keratosis: Secondary | ICD-10-CM | POA: Diagnosis not present

## 2017-01-29 ENCOUNTER — Ambulatory Visit (INDEPENDENT_AMBULATORY_CARE_PROVIDER_SITE_OTHER): Payer: Medicare HMO | Admitting: Physician Assistant

## 2017-01-29 ENCOUNTER — Encounter: Payer: Self-pay | Admitting: Physician Assistant

## 2017-01-29 VITALS — BP 155/92 | HR 55 | Ht 64.0 in | Wt 175.4 lb

## 2017-01-29 DIAGNOSIS — I480 Paroxysmal atrial fibrillation: Secondary | ICD-10-CM

## 2017-01-29 DIAGNOSIS — Z7901 Long term (current) use of anticoagulants: Secondary | ICD-10-CM

## 2017-01-29 DIAGNOSIS — I10 Essential (primary) hypertension: Secondary | ICD-10-CM | POA: Diagnosis not present

## 2017-01-29 MED ORDER — AMLODIPINE BESYLATE 5 MG PO TABS: 5.0000 mg | ORAL_TABLET | Freq: Every day | ORAL | 5 refills | Status: DC

## 2017-01-29 NOTE — Progress Notes (Signed)
Cardiology Office Note   Date:  01/29/2017   ID:  Deborah, Randall 1940-09-20, MRN [id number]  PCP:  Gayland Curry, DO  Cardiologist:  Dr. Sallyanne Kuster, 03/15/2016 Barrett, Suanne Marker, PA-C   History of Present Illness: Deborah Randall is a 76 y.o. female with a history of PAF dx 2011, w/ DCCV 2017, on Eliquis, HTN, HLD, hypothyroid, EF nl on echo w/ mod LA dilat 06/2015, breast CA s/p L mastectomy/XRT, nl MV 06/2015  Deborah Randall presents for cardiology follow up.   She feels she is doing pretty well. She does not have any palpitations, does not think she has had any afib.   However, she gets light-headed at times. She will stagger a little, then be ok. She denies presyncope or syncope. The symptoms seem to be orthostatic in nature. It happens several times/week.   She has headaches in the top of her head that go down the L side of her head a couple of times/week. She may get some nausea with this.   Her BP is high today, she took am meds about 8 am. She does not check her BP at home.   She has some DOE, when she walks, no recent change. She does not do many stairs. She does not wake with lower extremity edema, but gets it sometimes during the day.  She works part-time at the [employer], and will be on her feet all day when she does. On those days, she gets the most swelling.  She has had no chest pain with exertion. She will get some pain in her left scapular area when she is lifting boxes and doing heavy work in usual. She feels this is probably musculoskeletal in origin.   Past Medical History:  Diagnosis Date  . Anxiety   . Atrial fibrillation with rapid ventricular response ([place]) 07/07/2015  . Bilateral breast cancer ([place])   . Daily headache   . Depression   . GERD (gastroesophageal reflux disease)   . Hyperlipidemia   . Hypertension   . Hypothyroidism   . Iron deficiency anemia 2000s  . Lumbago   . Nausea alone   . Senile osteoporosis   . Squamous cell carcinoma of  forehead    "I think they burned it off"    Past Surgical History:  Procedure Laterality Date  . ABDOMINAL HYSTERECTOMY  1970s  . BREAST BIOPSY Bilateral   . BREAST LUMPECTOMY Right 2002   "w/lymph nodes removed"  . CARDIOVERSION N/A 08/19/2015   Procedure: CARDIOVERSION;  Surgeon: Sanda Klein, MD;  Location: [facility];  Service: Cardiovascular;  Laterality: N/A;  . CATARACT EXTRACTION W/ INTRAOCULAR LENS  IMPLANT, BILATERAL Bilateral 2000s  . DILATION AND CURETTAGE OF UTERUS  ~ 1968  . ELBOW FRACTURE SURGERY Left 2011   "has 2 screws in it"  . EXCISIONAL HEMORRHOIDECTOMY  ~ 1970  . FRACTURE SURGERY  02/12/2016   Right Wrist  . HEMIARTHROPLASTY HIP Left 2011   "had ball replaced"  . INGUINAL HERNIA REPAIR Right 1980s?  [name] MASTECTOMY Left 2005  . ORIF WRIST FRACTURE Left 02/12/2016   Procedure: OPEN REDUCTION INTERNAL FIXATION LEFT DISTAL RADIUS;  Surgeon: Dayna Barker, MD;  Location: [facility];  Service: Plastics;  Laterality: Left;  . TOTAL THYROIDECTOMY  1972  . WRIST FRACTURE SURGERY Right    "has a plate and 7 screws in there"    Current Outpatient Prescriptions  Medication Sig Dispense Refill  . acetaminophen (TYLENOL) 325 MG tablet Take  2 tablets (650 mg total) by mouth every 4 (four) hours as needed for headache or mild pain.    [name] ALPRAZolam (XANAX) 0.5 MG tablet Take 1 tablet (0.5 mg total) by mouth daily. For anxiety 30 tablet 0  . apixaban (ELIQUIS) 5 MG TABS tablet Take 1 tablet (5 mg total) by mouth 2 (two) times daily. 60 tablet 6  . Cholecalciferol (VITAMIN D3) 2000 UNITS TABS Take 1 tablet by mouth daily.     [name] levothyroxine (SYNTHROID, LEVOTHROID) 137 MCG tablet Take one tablet by mouth 30 minutes before breakfast once daily for thyroid 30 tablet 3  . metoprolol (LOPRESSOR) 50 MG tablet Take 1 tablet (50 mg total) by mouth 2 (two) times daily. 60 tablet 6  . pravastatin (PRAVACHOL) 40 MG tablet Take 1 tablet by mouth every evening with meal 90 tablet 1  .  ranitidine (ZANTAC) 75 MG tablet Take 75 mg by mouth 2 (two) times daily as needed (For Heartburn).     . sertraline (ZOLOFT) 25 MG tablet Take 2 tablets (50 mg total) by mouth daily. 180 tablet 1  . triamterene-hydrochlorothiazide (MAXZIDE-25) 37.5-25 MG tablet Take 0.5 tablets by mouth daily. 30 tablet 3   No current facility-administered medications for this visit.     Allergies:   Patient has no known allergies.    Social History:  The patient  reports that she has never smoked. She has never used smokeless tobacco. She reports that she does not drink alcohol or use drugs.   Family History:  The patient's family history includes COPD in her brother; Cancer in her father and sister; Diabetes in her brother; Heart attack in her brother; Heart disease in her sister; Hypertension in her sister; Mental illness in her son and son; Other in her brother, brother, and brother.    ROS:  Please see the history of present illness. All other systems are reviewed and negative.   Orthostatic VS Position BP HR  Lying 150/91 51  Sitting 187/84 53  Standing 155/92 55    PHYSICAL EXAM: VS:  BP (!) 178/98   Pulse (!) 53   Ht 5\' 4"  (1.626 m)   Wt 175 lb 6.4 oz (79.6 kg)   BMI 30.11 kg/m  , BMI Body mass index is 30.11 kg/m. GEN: Well nourished, well developed, female in no acute distress  HEENT: normal for age  Neck: no JVD, no carotid bruit, no masses Cardiac: RRR; soft murmur, no rubs, or gallops Respiratory:  clear to auscultation bilaterally, normal work of breathing GI: soft, nontender, nondistended, + BS MS: no deformity or atrophy; no edema; distal pulses are 2+ in all 4 extremities   Skin: warm and dry, no rash Neuro:  Strength and sensation are intact Psych: euthymic mood, full affect   EKG:  EKG is ordered today. The ekg ordered today demonstrates sinus bradycardia, heart rate 53, possible LVH 2017 ECG showed sinus bradycardia with heart rate 53, higher amplitude of the lateral  QRS complexes but otherwise no change  ECHO 07/08/2015 - Left ventricle: The cavity size was normal. Wall thickness was   increased in a pattern of mild LVH. Systolic function was normal.   The estimated ejection fraction was in the range of 60% to 65%.   The study is not technically sufficient to allow evaluation of LV   diastolic function. - Mitral valve: Mildly thickened leaflets . There was moderate   regurgitation. - Left atrium: Moderately dilated at 38 ml/m2. - Right atrium: The  atrium was at the upper limits of normal in size. - Tricuspid valve: There was mild regurgitation. - Pulmonary arteries: PA peak pressure: 26 mm Hg (S). - Inferior vena cava: The vessel was normal in size. The   respirophasic diameter changes were in the normal range (>= 50%),   consistent with normal central venous pressure. Impressions: - LVEF 60-65%, mild LVH, normal wall motion, thickend mitral   leaflets with moderate regurgitation, moderate LAE, upper normal   RA size, mild TR, normal RVSP.  MYOVIEW: 07/15/2015 Study Highlights   The left ventricular ejection fraction is normal (55-65%).  Nuclear stress EF: 57%.  There was no ST segment deviation noted during stress.  The study is normal.   Normal stress nuclear study with no ischemia or infarction; EF 57 with normal wall motion.     Recent Labs: 02/12/2016: BUN 11; Creatinine, Ser 0.92; Hemoglobin 12.0; Platelets 129; Potassium 4.4; Sodium 134    Lipid Panel    Component Value Date/Time   CHOL 155 11/22/2015 0858   TRIG 122 11/22/2015 0858   HDL 46 11/22/2015 0858   CHOLHDL 3.4 11/22/2015 0858   CHOLHDL 3.1 07/08/2015 0535   VLDL 17 07/08/2015 0535   LDLCALC 85 11/22/2015 0858     Wt Readings from Last 3 Encounters:  01/29/17 175 lb 6.4 oz (79.6 kg)  08/31/16 176 lb (79.8 kg)  03/15/16 174 lb 6.4 oz (79.1 kg)     Other studies Reviewed: Additional studies/ records that were reviewed today include: Office notes,  hospital records and testing.  ASSESSMENT AND PLAN:  1.  Paroxysmal atrial fibrillation: She is having no palpitations, does not think she has had any atrial fibrillation. She gets lightheaded at times, but orthostatic vital signs were negative today. Continue current metoprolol dose, she has had a resting HR in the low 50s for a long time.   2. Hypertension: Her blood pressure is very high today. She does not check it at home and does not know what it has been running. She seems a little anxious and may have some whitecoat hypertension, but her blood pressure was elevated on recheck. Add amlodipine 5 mg qd.  3. Chronic anticoagulation: No bleeding issues on Eliquis.   4. L scapular pain: there is no deformity or tenderness in that area. She will get it when she is lifting heavy boxes but it goes away in a day or so. She is encouraged to limit lifting heavy boxes as much as possible, and let us know if she has any chest pain or other symptoms with exertion.   Current medicines are reviewed at length with the patient today.  The patient does not have concerns regarding medicines.  The following changes have been made:  Add amlodipine  Labs/ tests ordered today include:  No orders of the defined types were placed in this encounter.    Disposition:   FU with Dr. Sallyanne Kuster   Signed, Lenoard Aden  01/29/2017 10:11 AM    [facility] Phone: [phone]; Fax: [phone]  This note was written with the assistance of speech recognition software. Please excuse any transcriptional errors.

## 2017-01-29 NOTE — Patient Instructions (Signed)
Medication Instructions:  START AMLODIPINE 5MG  DAILY Your physician recommends that you continue  your current medications as directed. Please refer to the Current Medication list given to you today.  If you need a refill on your cardiac medications before your next appointment, please call your pharmacy.  Follow-Up: Your physician wants you to follow-up in: 12 MONTHS -OR- SOONER (AS NEEDED) WITH DR [id number]. You should receive a reminder letter in the mail two months in advance. If you do not receive a letter, please call our office July 2019 to schedule the September 20119 follow-up appointment.   Thank you for choosing [facility]!!

## 2017-01-29 NOTE — Progress Notes (Signed)
Thanks, MCr 

## 2017-02-05 ENCOUNTER — Other Ambulatory Visit: Payer: Medicare HMO

## 2017-02-05 DIAGNOSIS — M810 Age-related osteoporosis without current pathological fracture: Secondary | ICD-10-CM | POA: Diagnosis not present

## 2017-02-05 DIAGNOSIS — E785 Hyperlipidemia, unspecified: Secondary | ICD-10-CM | POA: Diagnosis not present

## 2017-02-05 DIAGNOSIS — I10 Essential (primary) hypertension: Secondary | ICD-10-CM

## 2017-02-05 DIAGNOSIS — Z7901 Long term (current) use of anticoagulants: Secondary | ICD-10-CM

## 2017-02-05 DIAGNOSIS — E890 Postprocedural hypothyroidism: Secondary | ICD-10-CM | POA: Diagnosis not present

## 2017-02-05 LAB — LIPID PANEL
Cholesterol: 202 mg/dL — ABNORMAL HIGH (ref <200)
HDL: 47 mg/dL — ABNORMAL LOW (ref >50)
LDL Cholesterol (Calc): 130 mg/dL (calc) — ABNORMAL HIGH
Non-HDL Cholesterol (Calc): 155 mg/dL (calc) — ABNORMAL HIGH (ref <130)
Total CHOL/HDL Ratio: 4.3 (calc) (ref <5.0)
Triglycerides: 140 mg/dL (ref <150)

## 2017-02-05 LAB — COMPLETE METABOLIC PANEL WITH GFR
AG Ratio: 1.7 (calc) (ref 1.0–2.5)
ALT: 16 U/L (ref 6–29)
AST: 21 U/L (ref 10–35)
Albumin: 4 g/dL (ref 3.6–5.1)
Alkaline phosphatase (APISO): 61 U/L (ref 33–130)
BUN/Creatinine Ratio: 14 (calc) (ref 6–22)
BUN: 14 mg/dL (ref 7–25)
CO2: 26 mmol/L (ref 20–32)
Calcium: 8.9 mg/dL (ref 8.6–10.4)
Chloride: 104 mmol/L (ref 98–110)
Creat: 1.03 mg/dL — ABNORMAL HIGH (ref 0.60–0.93)
GFR, Est African American: 61 mL/min/{1.73_m2} (ref >=60)
GFR, Est Non African American: 52 mL/min/{1.73_m2} — ABNORMAL LOW (ref >=60)
Globulin: 2.4 g/dL (calc) (ref 1.9–3.7)
Glucose, Bld: 92 mg/dL (ref 65–99)
Potassium: 4 mmol/L (ref 3.5–5.3)
Sodium: 139 mmol/L (ref 135–146)
Total Bilirubin: 0.6 mg/dL (ref 0.2–1.2)
Total Protein: 6.4 g/dL (ref 6.1–8.1)

## 2017-02-05 LAB — CBC WITH DIFFERENTIAL/PLATELET
Basophils Absolute: 72 cells/uL (ref 0–200)
Basophils Relative: 1.1 %
Eosinophils Absolute: 228 cells/uL (ref 15–500)
Eosinophils Relative: 3.5 %
HCT: 41.6 % (ref 35.0–45.0)
Hemoglobin: 13.6 g/dL (ref 11.7–15.5)
Lymphs Abs: 2301 cells/uL (ref 850–3900)
MCH: 27.4 pg (ref 27.0–33.0)
MCHC: 32.7 g/dL (ref 32.0–36.0)
MCV: 83.9 fL (ref 80.0–100.0)
MPV: 9.9 fL (ref 7.5–12.5)
Monocytes Relative: 9.4 %
Neutro Abs: 3289 cells/uL (ref 1500–7800)
Neutrophils Relative %: 50.6 %
Platelets: 174 10*3/uL (ref 140–400)
RBC: 4.96 10*6/uL (ref 3.80–5.10)
RDW: 14.7 % (ref 11.0–15.0)
Total Lymphocyte: 35.4 %
WBC mixed population: 611 cells/uL (ref 200–950)
WBC: 6.5 10*3/uL (ref 3.8–10.8)

## 2017-02-05 LAB — TSH: TSH: 9.72 mIU/L — ABNORMAL HIGH (ref 0.40–4.50)

## 2017-02-06 ENCOUNTER — Other Ambulatory Visit: Payer: Self-pay | Admitting: *Deleted

## 2017-02-06 MED ORDER — ALPRAZOLAM 0.5 MG PO TABS: 0.5000 mg | ORAL_TABLET | Freq: Every day | ORAL | 0 refills | Status: DC

## 2017-02-06 NOTE — Telephone Encounter (Signed)
[facility] 

## 2017-02-08 ENCOUNTER — Encounter: Payer: Self-pay | Admitting: Internal Medicine

## 2017-02-08 ENCOUNTER — Ambulatory Visit (INDEPENDENT_AMBULATORY_CARE_PROVIDER_SITE_OTHER): Payer: Medicare HMO | Admitting: Internal Medicine

## 2017-02-08 VITALS — BP 128/78 | HR 64 | Temp 98.1°F | Wt 174.0 lb

## 2017-02-08 DIAGNOSIS — F3341 Major depressive disorder, recurrent, in partial remission: Secondary | ICD-10-CM

## 2017-02-08 DIAGNOSIS — Z23 Encounter for immunization: Secondary | ICD-10-CM | POA: Diagnosis not present

## 2017-02-08 DIAGNOSIS — E785 Hyperlipidemia, unspecified: Secondary | ICD-10-CM | POA: Diagnosis not present

## 2017-02-08 DIAGNOSIS — M810 Age-related osteoporosis without current pathological fracture: Secondary | ICD-10-CM

## 2017-02-08 DIAGNOSIS — E038 Other specified hypothyroidism: Secondary | ICD-10-CM

## 2017-02-08 DIAGNOSIS — I4891 Unspecified atrial fibrillation: Secondary | ICD-10-CM

## 2017-02-08 DIAGNOSIS — Z7901 Long term (current) use of anticoagulants: Secondary | ICD-10-CM

## 2017-02-08 DIAGNOSIS — I10 Essential (primary) hypertension: Secondary | ICD-10-CM | POA: Diagnosis not present

## 2017-02-08 MED ORDER — LEVOTHYROXINE SODIUM 150 MCG PO TABS: ORAL_TABLET | ORAL | 3 refills | Status: DC

## 2017-02-08 MED ORDER — PRAVASTATIN SODIUM 40 MG PO TABS: 40.0000 mg | ORAL_TABLET | Freq: Every day | ORAL | 1 refills | Status: DC

## 2017-02-08 NOTE — Progress Notes (Signed)
Location:  [facility] clinic Provider:  Kristoph Sattler L. Mariea Clonts, D.O., C.M.D.  Code Status: full code Goals of Care:  Advanced Directives 02/08/2017  Does Patient Have a Medical Advance Directive? No  Type of Advance Directive -  Does patient want to make changes to medical advance directive? -  Copy of [place] in Chart? -  Would patient like information on creating a medical advance directive? No - Patient declined   Chief Complaint  Patient presents with  . Medical Management of Chronic Issues    28mth follow-up    HPI: Patient is a 76 y.o. female seen today for medical management of chronic diseases.    Osteoporosis:  Was on vitamin D and fosamax.  Now off fosamax.  Was to get bone density study, but never went.  Says she does not know if she wants to get the test or take anymore medication.  I could not get her to answer me with certainty.  Hypothyroidism:  Thinks she has only missed one day of thyroid medication.  She has been taking it with her other pills. Discussed dosing should be separate first thing in the morning and she will try that.  Reports years of being on higher doses like 140mcg, but lately she's required less. Has had some headaches.  HTN:  bp at goal.  Saw cardiology recently and bp very high that day.  Amlodipine 5mg  daily was added then.    Paroxysmal afib:  Stable on metoprolol and anticoagulation with eliquis.  Renal function   Hyperlipidemia:  Forgets the cholesterol pill in the evening.  Discussed moving to the morning to avoid forgetting it.  On pravachol.    Depression:  Doing ok.    Got her flu shot today.  Past Medical History:  Diagnosis Date  . Anxiety   . Atrial fibrillation with rapid ventricular response ([place]) 07/07/2015  . Bilateral breast cancer ([place])   . Daily headache   . Depression   . GERD (gastroesophageal reflux disease)   . Hyperlipidemia   . Hypertension   . Hypothyroidism   . Iron deficiency anemia 2000s  . Lumbago     . Nausea alone   . Senile osteoporosis   . Squamous cell carcinoma of forehead    "I think they burned it off"    Past Surgical History:  Procedure Laterality Date  . ABDOMINAL HYSTERECTOMY  1970s  . BREAST BIOPSY Bilateral   . BREAST LUMPECTOMY Right 2002   "w/lymph nodes removed"  . CARDIOVERSION N/A 08/19/2015   Procedure: CARDIOVERSION;  Surgeon: Sanda Klein, MD;  Location: [facility];  Service: Cardiovascular;  Laterality: N/A;  . CATARACT EXTRACTION W/ INTRAOCULAR LENS  IMPLANT, BILATERAL Bilateral 2000s  . DILATION AND CURETTAGE OF UTERUS  ~ 1968  . ELBOW FRACTURE SURGERY Left 2011   "has 2 screws in it"  . EXCISIONAL HEMORRHOIDECTOMY  ~ 1970  . FRACTURE SURGERY  02/12/2016   Right Wrist  . HEMIARTHROPLASTY HIP Left 2011   "had ball replaced"  . INGUINAL HERNIA REPAIR Right 1980s?  [name] MASTECTOMY Left 2005  . ORIF WRIST FRACTURE Left 02/12/2016   Procedure: OPEN REDUCTION INTERNAL FIXATION LEFT DISTAL RADIUS;  Surgeon: Dayna Barker, MD;  Location: [facility];  Service: Plastics;  Laterality: Left;  . TOTAL THYROIDECTOMY  1972  . WRIST FRACTURE SURGERY Right    "has a plate and 7 screws in there"    No Known Allergies  Outpatient Encounter Prescriptions as of 02/08/2017  Medication Sig  .  acetaminophen (TYLENOL) 325 MG tablet Take 2 tablets (650 mg total) by mouth every 4 (four) hours as needed for headache or mild pain.  [name] ALPRAZolam (XANAX) 0.5 MG tablet Take 1 tablet (0.5 mg total) by mouth daily. For anxiety  . amLODipine (NORVASC) 5 MG tablet Take 1 tablet (5 mg total) by mouth daily.  [name] apixaban (ELIQUIS) 5 MG TABS tablet Take 1 tablet (5 mg total) by mouth 2 (two) times daily.  . Cholecalciferol (VITAMIN D3) 2000 UNITS TABS Take 1 tablet by mouth daily.   [name] levothyroxine (SYNTHROID, LEVOTHROID) 137 MCG tablet Take one tablet by mouth 30 minutes before breakfast once daily for thyroid  . metoprolol (LOPRESSOR) 50 MG tablet Take 1 tablet (50 mg total) by mouth 2  (two) times daily.  . pravastatin (PRAVACHOL) 40 MG tablet Take 1 tablet by mouth every evening with meal  . ranitidine (ZANTAC) 75 MG tablet Take 75 mg by mouth 2 (two) times daily as needed (For Heartburn).   . sertraline (ZOLOFT) 25 MG tablet Take 2 tablets (50 mg total) by mouth daily.  [name] triamterene-hydrochlorothiazide (MAXZIDE-25) 37.5-25 MG tablet Take 0.5 tablets by mouth daily.   No facility-administered encounter medications on file as of 02/08/2017.     Review of Systems:  Review of Systems  Constitutional: Negative for chills, fever and malaise/fatigue.  HENT: Negative for congestion.   Eyes: Negative for blurred vision.  Respiratory: Negative for cough and shortness of breath.   Cardiovascular: Negative for chest pain, palpitations and leg swelling.  Gastrointestinal: Negative for abdominal pain, blood in stool, constipation and melena.  Genitourinary: Negative for dysuria.  Musculoskeletal: Negative for falls and joint pain.  Skin: Negative for itching and rash.  Neurological: Positive for headaches. Negative for weakness.  Endo/Heme/Allergies: Bruises/bleeds easily.  Psychiatric/Behavioral: Positive for depression. Negative for memory loss. The patient is nervous/anxious. The patient does not have insomnia.     Health Maintenance  Topic Date Due  . INFLUENZA VACCINE  12/20/2016  . TETANUS/TDAP  01/24/2020  . DEXA SCAN  Completed  . PNA vac Low Risk Adult  Completed    Physical Exam: Vitals:   02/08/17 0827  BP: 128/78  Pulse: 64  Temp: 98.1 F (36.7 C)  TempSrc: Oral  SpO2: 96%  Weight: 174 lb (78.9 kg)   Body mass index is 29.87 kg/m. Physical Exam  Constitutional: She is oriented to person, place, and time. She appears well-developed and well-nourished. No distress.  Cardiovascular: Normal rate, regular rhythm, normal heart sounds and intact distal pulses.   Pulmonary/Chest: Effort normal and breath sounds normal. No respiratory distress.  Abdominal:  Bowel sounds are normal.  Musculoskeletal: Normal range of motion. She exhibits no tenderness.  Neurological: She is alert and oriented to person, place, and time.  Skin: Skin is warm and dry.  Psychiatric: She has a normal mood and affect.    Labs reviewed: Basic Metabolic Panel:  Recent Labs  02/11/16 0713 02/12/16 0514 02/05/17 0836  NA 134* 134* 139  K 3.7 4.4 4.0  CL 100* 98* 104  CO2 24 30 26   GLUCOSE 115* 120* 92  BUN 16 11 14   CREATININE 1.01* 0.92 1.03*  CALCIUM 8.9 8.7* 8.9  TSH  --   --  9.72*   Liver Function Tests:  Recent Labs  02/05/17 0836  AST 21  ALT 16  BILITOT 0.6  PROT 6.4   No results for input(s): LIPASE, AMYLASE in the last 8760 hours. No results for input(s): AMMONIA  in the last 8760 hours. CBC:  Recent Labs  02/11/16 0713 02/12/16 0514 02/05/17 0836  WBC 10.8* 9.9 6.5  NEUTROABS  --   --  3,289  HGB 12.8 12.0 13.6  HCT 40.2 39.2 41.6  MCV 87.2 89.3 83.9  PLT 163 129* 174   Lipid Panel:  Recent Labs  02/05/17 0836  CHOL 202*  HDL 47*  TRIG 140  CHOLHDL 4.3   Lab Results  Component Value Date   HGBA1C 5.8 (H) 11/22/2015    Assessment/Plan 1. Other specified hypothyroidism - increase dose of levothyroxine, take first thing in am separate from other meds and before breakfast - levothyroxine (SYNTHROID, LEVOTHROID) 150 MCG tablet; Take one tablet by mouth 30 minutes before breakfast once daily for thyroid  Dispense: 90 tablet; Refill: 3  2. Need for immunization against influenza - Flu vaccine HIGH DOSE PF (Fluzone High dose)  3. Atrial fibrillation with rapid ventricular response ([facility]) -cont metoprolol and eliquis  4. Senile osteoporosis -cont vitamin D, increase weightbearing exercise  5. Essential hypertension, benign -bp at goal with current therapy since amlodipine was added  6. Chronic anticoagulation -cont eliquis therapy  7. Hyperlipidemia with target LDL less than 100 -cont pravachol, but move to am  so she remembers it  8. Recurrent major depressive disorder, in partial remission ([place]) -cont zoloft therapy  Labs/tests ordered:  Will f/u TSH and lipid day of appt Next appt:  06/11/2017  Syriah Delisi L. Jese Comella, D.O. [facility] [address], [place] [address] Cell Phone (Mon-Fri 8am-5pm):  [phone] On Call:  [phone] & follow prompts after 5pm & weekends Office Phone:  [phone] Office Fax:  [phone]

## 2017-02-08 NOTE — Patient Instructions (Signed)
Please try to walk outside more.

## 2017-02-14 ENCOUNTER — Other Ambulatory Visit: Payer: Self-pay | Admitting: *Deleted

## 2017-02-14 ENCOUNTER — Other Ambulatory Visit: Payer: Self-pay | Admitting: Cardiovascular Disease

## 2017-02-14 DIAGNOSIS — F3289 Other specified depressive episodes: Secondary | ICD-10-CM

## 2017-02-14 MED ORDER — SERTRALINE HCL 25 MG PO TABS
50.0000 mg | ORAL_TABLET | Freq: Every day | ORAL | 1 refills | Status: DC
Start: 2017-02-14 — End: 2017-08-22

## 2017-02-14 NOTE — Telephone Encounter (Signed)
[facility] 

## 2017-03-16 ENCOUNTER — Other Ambulatory Visit: Payer: Self-pay | Admitting: *Deleted

## 2017-03-16 MED ORDER — ALPRAZOLAM 0.5 MG PO TABS
0.5000 mg | ORAL_TABLET | Freq: Every day | ORAL | 0 refills | Status: DC
Start: 2017-03-16 — End: 2017-04-17

## 2017-03-16 NOTE — Telephone Encounter (Signed)
[facility] 

## 2017-04-17 ENCOUNTER — Other Ambulatory Visit: Payer: Self-pay | Admitting: *Deleted

## 2017-04-17 MED ORDER — ALPRAZOLAM 0.5 MG PO TABS: 0.5000 mg | ORAL_TABLET | Freq: Every day | ORAL | 0 refills | Status: DC

## 2017-04-17 NOTE — Telephone Encounter (Signed)
[facility] 

## 2017-05-16 ENCOUNTER — Ambulatory Visit (INDEPENDENT_AMBULATORY_CARE_PROVIDER_SITE_OTHER): Payer: Medicare HMO

## 2017-05-16 VITALS — BP 132/84 | HR 58 | Temp 97.9°F | Ht 64.0 in | Wt 168.0 lb

## 2017-05-16 DIAGNOSIS — Z0000 Encounter for general adult medical examination without abnormal findings: Secondary | ICD-10-CM | POA: Diagnosis not present

## 2017-05-16 MED ORDER — ZOSTER VAC RECOMB ADJUVANTED 50 MCG/0.5ML IM SUSR: 0.5000 mL | Freq: Once | INTRAMUSCULAR | 1 refills | Status: AC

## 2017-05-16 NOTE — Progress Notes (Signed)
Subjective:   Deborah Randall is a 76 y.o. female who presents for Medicare Annual (Subsequent) preventive examination.  Last AWV-03/02/2016    Objective:     Vitals: BP 132/84 (BP Location: Right Arm, Patient Position: Sitting)   Pulse (!) 58   Temp 97.9 F (36.6 C) (Oral)   Ht 5\' 4"  (1.626 m)   Wt 168 lb (76.2 kg)   SpO2 98%   BMI 28.84 kg/m   Body mass index is 28.84 kg/m.  Advanced Directives 05/16/2017 02/08/2017 08/31/2016 03/02/2016 02/12/2016 02/11/2016 11/22/2015  Does Patient Have a Medical Advance Directive? Yes No No Yes Yes Yes No  Type of Paramedic of [place];Living will - - Living will;Healthcare Power of Attorney Living will Living will -  Does patient want to make changes to medical advance directive? No - Patient declined - - No - Patient declined No - Patient declined - -  Copy of [place] in Chart? No - copy requested - - No - copy requested No - copy requested No - copy requested -  Would patient like information on creating a medical advance directive? - No - Patient declined No - Patient declined - - - -    Tobacco Social History   Tobacco Use  Smoking Status Never Smoker  Smokeless Tobacco Never Used     Counseling given: Not Answered   Clinical Intake:  Pre-visit preparation completed: No  Pain : No/denies pain     Nutritional Risks: None Diabetes: No  How often do you need to have someone help you when you read instructions, pamphlets, or other written materials from your doctor or pharmacy?: 1 - Never What is the last grade level you completed in school?: 12th grade  Interpreter Needed?: No  Information entered by :: Tyson Dense, RN  Past Medical History:  Diagnosis Date  . Anxiety   . Atrial fibrillation with rapid ventricular response ([place]) 07/07/2015  . Bilateral breast cancer ([place])   . Daily headache   . Depression   . GERD (gastroesophageal reflux disease)   . Hyperlipidemia   .  Hypertension   . Hypothyroidism   . Iron deficiency anemia 2000s  . Lumbago   . Nausea alone   . Senile osteoporosis   . Squamous cell carcinoma of forehead    "I think they burned it off"   Past Surgical History:  Procedure Laterality Date  . ABDOMINAL HYSTERECTOMY  1970s  . BREAST BIOPSY Bilateral   . BREAST LUMPECTOMY Right 2002   "w/lymph nodes removed"  . CARDIOVERSION N/A 08/19/2015   Procedure: CARDIOVERSION;  Surgeon: Sanda Klein, MD;  Location: [facility];  Service: Cardiovascular;  Laterality: N/A;  . CATARACT EXTRACTION W/ INTRAOCULAR LENS  IMPLANT, BILATERAL Bilateral 2000s  . DILATION AND CURETTAGE OF UTERUS  ~ 1968  . ELBOW FRACTURE SURGERY Left 2011   "has 2 screws in it"  . EXCISIONAL HEMORRHOIDECTOMY  ~ 1970  . FRACTURE SURGERY  02/12/2016   Right Wrist  . HEMIARTHROPLASTY HIP Left 2011   "had ball replaced"  . INGUINAL HERNIA REPAIR Right 1980s?  [name] MASTECTOMY Left 2005  . ORIF WRIST FRACTURE Left 02/12/2016   Procedure: OPEN REDUCTION INTERNAL FIXATION LEFT DISTAL RADIUS;  Surgeon: Dayna Barker, MD;  Location: [facility];  Service: Plastics;  Laterality: Left;  . TOTAL THYROIDECTOMY  1972  . WRIST FRACTURE SURGERY Right    "has a plate and 7 screws in there"   Family History  Problem  Relation Age of Onset  . Cancer Father   . Heart attack Brother   . Mental illness Son   . Other Brother   . Other Brother   . Cancer Sister   . Heart disease Sister   . Hypertension Sister   . Other Brother   . Diabetes Brother   . COPD Brother   . Mental illness Son    Social History   Socioeconomic History  . Marital status: Widowed    Spouse name: None  . Number of children: None  . Years of education: None  . Highest education level: None  Social Needs  . Financial resource strain: Not hard at all  . Food insecurity - worry: Never true  . Food insecurity - inability: Never true  . Transportation needs - medical: No  . Transportation needs - non-medical:  No  Occupational History  . 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 Smoking status: Never Smoker  . Smokeless tobacco: Never Used  Substance and Sexual Activity  . Alcohol use: No  . Drug use: No  . Sexual activity: No  Other Topics Concern  . None  Social History Narrative   Lives alone   Widow   2 sons , one son decease, one son disable    Works at [employer] in [employer]   Enjoys going places with friends   Never smoked   Alcohol none   Exercise -walk, works in yard     Outpatient Encounter Medications as of 05/16/2017  Medication Sig  . acetaminophen (TYLENOL) 325 MG tablet Take 2 tablets (650 mg total) by mouth every 4 (four) hours as needed for headache or mild pain.  [name] ALPRAZolam (XANAX) 0.5 MG tablet Take 1 tablet (0.5 mg total) by mouth daily. For anxiety  . amLODipine (NORVASC) 5 MG tablet Take 1 tablet (5 mg total) by mouth daily.  . Cholecalciferol (VITAMIN D3) 2000 UNITS TABS Take 1 tablet by mouth daily.   [name] ELIQUIS 5 MG TABS tablet TAKE 1 TABLET BY MOUTH TWICE DAILY  . levothyroxine (SYNTHROID, LEVOTHROID) 150 MCG tablet Take one tablet by mouth 30 minutes before breakfast once daily for thyroid  . metoprolol tartrate (LOPRESSOR) 50 MG tablet TAKE 1 TABLET BY MOUTH TWICE DAILY  . pravastatin (PRAVACHOL) 40 MG tablet Take 1 tablet (40 mg total) by mouth daily.  . ranitidine (ZANTAC) 75 MG tablet Take 75 mg by mouth 2 (two) times daily as needed (For Heartburn).   . sertraline (ZOLOFT) 25 MG tablet Take 2 tablets (50 mg total) by mouth daily.  [name] triamterene-hydrochlorothiazide (MAXZIDE-25) 37.5-25 MG tablet Take 0.5 tablets by mouth daily.  [name] Zoster Vaccine Adjuvanted [facility]) injection Inject 0.5 mLs into the muscle once for 1 dose.  . [DISCONTINUED] Zoster Vaccine Adjuvanted [facility]) injection Inject 0.5 mLs into the muscle once.   No facility-administered encounter medications on file as of 05/16/2017.     Activities of Daily  Living In your present state of health, do you have any difficulty performing the following activities: 05/16/2017  Hearing? N  Vision? N  Difficulty concentrating or making decisions? Y  Comment Forgets names  Walking or climbing stairs? N  Dressing or bathing? N  Doing errands, shopping? N  Preparing Food and eating ? N  Using the Toilet? N  In the past six months, have you accidently leaked urine? N  Do you have problems with loss of bowel control? N  Managing your Medications? N  Managing your Finances? N  Housekeeping or managing your Housekeeping? N  Some recent data might be hidden    Patient Care Team: Gayland Curry, DO as PCP - General (Geriatric Medicine) Sanda Klein, MD as Consulting Physician (Cardiology) [place], Amy, MD as Consulting Physician (Dermatology)    Assessment:   This is a routine wellness examination for [facility].  Exercise Activities and Dietary recommendations Current Exercise Habits: The patient does not participate in regular exercise at present, Exercise limited by: None identified  Goals    . Weight (lb) < 160 lb (72.6 kg)     Pt will walk more to lose weight       Fall Risk Fall Risk  05/16/2017 02/08/2017 08/31/2016 03/02/2016 11/22/2015  Falls in the past year? No No No Yes No  Number falls in past yr: - - - 1 -  Injury with Fall? - - - Yes -  Comment - - - Broken Right Wrist -  Risk for fall due to : - - - Impaired vision -  Follow up - - - Falls evaluation completed;Education provided -  Comment - - - Tripped going down 2-3 brick stairs onto concrete flooring -   Is the patient's home free of loose throw rugs in walkways, pet beds, electrical cords, etc?   yes      Grab bars in the bathroom? yes      Handrails on the stairs?   yes      Adequate lighting?   yes  Timed Get Up and Go performed: 32 seconds, fall risk  Depression Screen PHQ 2/9 Scores 05/16/2017 02/08/2017 08/31/2016 03/02/2016  PHQ - 2 Score 0 0 0 -  Exception  Documentation - - - Medical reason     Cognitive Function MMSE - Mini Mental State Exam 05/16/2017 03/02/2016 02/19/2015 09/25/2013  Orientation to time 5 5 5 5   Orientation to Place 5 5 5 5   Registration 3 3 3 3   Attention/ Calculation 5 5 5 5   Recall 1 3 3 2   Language- name 2 objects 2 2 2 2   Language- repeat 1 1 1 1   Language- follow 3 step command 3 3 3 3   Language- read & follow direction 1 1 1 1   Write a sentence 1 1 1 1   Copy design 1 - 1 1  Total score 28 - 30 29        Immunization History  Administered Date(s) Administered  . Influenza, High Dose Seasonal PF 02/08/2017  . Influenza,inj,Quad PF,6+ Mos 03/14/2013, 03/26/2014, 02/19/2015, 03/02/2016  . Pneumococcal Conjugate-13 08/02/2013, 08/03/2014  . Pneumococcal Polysaccharide-23 05/22/2009  . Zoster 09/25/2013    Qualifies for Shingles Vaccine? Yes, educated and prescription sent to pharmacy  Screening Tests Health Maintenance  Topic Date Due  . TETANUS/TDAP  01/24/2020  . INFLUENZA VACCINE  Completed  . DEXA SCAN  Completed  . PNA vac Low Risk Adult  Completed    Cancer Screenings: Lung: Low Dose CT Chest recommended if Age 43-80 years, 30 pack-year currently smoking OR have quit w/in 15years. Patient does not qualify. Breast:  Up to date on Mammogram? Yes   Up to date of Bone Density/Dexa? Yes Colorectal: up to date  Additional Screenings:  Hepatitis B/HIV/Syphillis:Not indicated Hepatitis C Screening: Not indicated     Plan:    I have personally reviewed and addressed the Medicare Annual Wellness questionnaire and have noted the following in the patient's chart:  A. Medical and social history B. Use of alcohol,  tobacco or illicit drugs  C. Current medications and supplements D. Functional ability and status E.  Nutritional status F.  Physical activity G. Advance directives H. List of other physicians I.  Hospitalizations, surgeries, and ER visits in previous 12 months J.   [place] to include hearing, vision, cognitive, depression L. Referrals and appointments - none  In addition, I have reviewed and discussed with patient certain preventive protocols, quality metrics, and best practice recommendations. A written personalized care plan for preventive services as well as general preventive health recommendations were provided to patient.  See attached scanned questionnaire for additional information.   Signed,   Tyson Dense, RN Nurse Health Advisor   Quick Notes   Health Maintenance: Shingrix prescription sent to pharmacy     Abnormal Screen: MMSE 28/30. Passed clock drawing     Patient Concerns: none     Nurse Concerns: none

## 2017-05-16 NOTE — Patient Instructions (Addendum)
Deborah Randall , Thank you for taking time to come for your Medicare Wellness Visit. I appreciate your ongoing commitment to your health goals. Please review the following plan we discussed and let me know if I can assist you in the future.   Screening recommendations/referrals: Colonoscopy excluded, you are over age 77 Mammogram up to date.  Bone Density up to date Recommended yearly ophthalmology/optometry visit for glaucoma screening and checkup Recommended yearly dental visit for hygiene and checkup  Vaccinations: Influenza vaccine up to date. Due 2019 fall season Pneumococcal vaccine up to date Tdap vaccine up to date. Due 01/24/2020 Shingles vaccine due, prescription sent to pharmacy    Advanced directives: Please bring [place] a copy of your living will and health care power of attorney  Conditions/risks identified: none  Next appointment: Dr. Mariea Clonts 06/11/2017 @ 10am             Tyson Dense, RN 05/20/2018 @ 10am   Preventive Care 65 Years and Older, Female Preventive care refers to lifestyle choices and visits with your health care provider that can promote health and wellness. What does preventive care include?  A yearly physical exam. This is also called an annual well check.  Dental exams once or twice a year.  Routine eye exams. Ask your health care provider how often you should have your eyes checked.  Personal lifestyle choices, including:  Daily care of your teeth and gums.  Regular physical activity.  Eating a healthy diet.  Avoiding tobacco and drug use.  Limiting alcohol use.  Practicing safe sex.  Taking low-dose aspirin every day.  Taking vitamin and mineral supplements as recommended by your health care provider. What happens during an annual well check? The services and screenings done by your health care provider during your annual well check will depend on your age, overall health, lifestyle risk factors, and family history of disease. Counseling  Your  health care provider may ask you questions about your:  Alcohol use.  Tobacco use.  Drug use.  Emotional well-being.  Home and relationship well-being.  Sexual activity.  Eating habits.  History of falls.  Memory and ability to understand (cognition).  Work and work Statistician.  Reproductive health. Screening  You may have the following tests or measurements:  Height, weight, and BMI.  Blood pressure.  Lipid and cholesterol levels. These may be checked every 5 years, or more frequently if you are over 77 years old.  Skin check.  Lung cancer screening. You may have this screening every year starting at age 28 if you have a 30-pack-year history of smoking and currently smoke or have quit within the past 15 years.  Fecal occult blood test (FOBT) of the stool. You may have this test every year starting at age 5.  Flexible sigmoidoscopy or colonoscopy. You may have a sigmoidoscopy every 5 years or a colonoscopy every 10 years starting at age 27.  Hepatitis C blood test.  Hepatitis B blood test.  Sexually transmitted disease (STD) testing.  Diabetes screening. This is done by checking your blood sugar (glucose) after you have not eaten for a while (fasting). You may have this done every 1-3 years.  Bone density scan. This is done to screen for osteoporosis. You may have this done starting at age 25.  Mammogram. This may be done every 1-2 years. Talk to your health care provider about how often you should have regular mammograms. Talk with your health care provider about your test results, treatment options, and  if necessary, the need for more tests. Vaccines  Your health care provider may recommend certain vaccines, such as:  Influenza vaccine. This is recommended every year.  Tetanus, diphtheria, and acellular pertussis (Tdap, Td) vaccine. You may need a Td booster every 10 years.  Zoster vaccine. You may need this after age 65.  Pneumococcal 13-valent  conjugate (PCV13) vaccine. One dose is recommended after age 38.  Pneumococcal polysaccharide (PPSV23) vaccine. One dose is recommended after age 75. Talk to your health care provider about which screenings and vaccines you need and how often you need them. This information is not intended to replace advice given to you by your health care provider. Make sure you discuss any questions you have with your health care provider. Document Released: 06/04/2015 Document Revised: 01/26/2016 Document Reviewed: 03/09/2015 Elsevier Interactive Patient Education  2017 [place] Prevention in the Home Falls can cause injuries. They can happen to people of all ages. There are many things you can do to make your home safe and to help prevent falls. What can I do on the outside of my home?  Regularly fix the edges of walkways and driveways and fix any cracks.  Remove anything that might make you trip as you walk through a door, such as a raised step or threshold.  Trim any bushes or trees on the path to your home.  Use bright outdoor lighting.  Clear any walking paths of anything that might make someone trip, such as rocks or tools.  Regularly check to see if handrails are loose or broken. Make sure that both sides of any steps have handrails.  Any raised decks and porches should have guardrails on the edges.  Have any leaves, snow, or ice cleared regularly.  Use sand or salt on walking paths during winter.  Clean up any spills in your garage right away. This includes oil or grease spills. What can I do in the bathroom?  Use night lights.  Install grab bars by the toilet and in the tub and shower. Do not use towel bars as grab bars.  Use non-skid mats or decals in the tub or shower.  If you need to sit down in the shower, use a plastic, non-slip stool.  Keep the floor dry. Clean up any water that spills on the floor as soon as it happens.  Remove soap buildup in the tub or  shower regularly.  Attach bath mats securely with double-sided non-slip rug tape.  Do not have throw rugs and other things on the floor that can make you trip. What can I do in the bedroom?  Use night lights.  Make sure that you have a light by your bed that is easy to reach.  Do not use any sheets or blankets that are too big for your bed. They should not hang down onto the floor.  Have a firm chair that has side arms. You can use this for support while you get dressed.  Do not have throw rugs and other things on the floor that can make you trip. What can I do in the kitchen?  Clean up any spills right away.  Avoid walking on wet floors.  Keep items that you use a lot in easy-to-reach places.  If you need to reach something above you, use a strong step stool that has a grab bar.  Keep electrical cords out of the way.  Do not use floor polish or wax that makes floors slippery. If you  must use wax, use non-skid floor wax.  Do not have throw rugs and other things on the floor that can make you trip. What can I do with my stairs?  Do not leave any items on the stairs.  Make sure that there are handrails on both sides of the stairs and use them. Fix handrails that are broken or loose. Make sure that handrails are as long as the stairways.  Check any carpeting to make sure that it is firmly attached to the stairs. Fix any carpet that is loose or worn.  Avoid having throw rugs at the top or bottom of the stairs. If you do have throw rugs, attach them to the floor with carpet tape.  Make sure that you have a light switch at the top of the stairs and the bottom of the stairs. If you do not have them, ask someone to add them for you. What else can I do to help prevent falls?  Wear shoes that:  Do not have high heels.  Have rubber bottoms.  Are comfortable and fit you well.  Are closed at the toe. Do not wear sandals.  If you use a stepladder:  Make sure that it is fully  opened. Do not climb a closed stepladder.  Make sure that both sides of the stepladder are locked into place.  Ask someone to hold it for you, if possible.  Clearly mark and make sure that you can see:  Any grab bars or handrails.  First and last steps.  Where the edge of each step is.  Use tools that help you move around (mobility aids) if they are needed. These include:  Canes.  Walkers.  Scooters.  Crutches.  Turn on the lights when you go into a dark area. Replace any light bulbs as soon as they burn out.  Set up your furniture so you have a clear path. Avoid moving your furniture around.  If any of your floors are uneven, fix them.  If there are any pets around you, be aware of where they are.  Review your medicines with your doctor. Some medicines can make you feel dizzy. This can increase your chance of falling. Ask your doctor what other things that you can do to help prevent falls. This information is not intended to replace advice given to you by your health care provider. Make sure you discuss any questions you have with your health care provider. Document Released: 03/04/2009 Document Revised: 10/14/2015 Document Reviewed: 06/12/2014 Elsevier Interactive Patient Education  2017 [employer].

## 2017-05-23 ENCOUNTER — Other Ambulatory Visit: Payer: Self-pay | Admitting: Nurse Practitioner

## 2017-05-23 NOTE — Telephone Encounter (Signed)
[facility] 

## 2017-05-23 NOTE — Telephone Encounter (Signed)
rx called into pharmacy

## 2017-06-11 ENCOUNTER — Encounter: Payer: Self-pay | Admitting: Internal Medicine

## 2017-06-11 ENCOUNTER — Ambulatory Visit (INDEPENDENT_AMBULATORY_CARE_PROVIDER_SITE_OTHER): Payer: Medicare HMO | Admitting: Internal Medicine

## 2017-06-11 VITALS — BP 122/78 | HR 54 | Temp 98.0°F | Wt 168.0 lb

## 2017-06-11 DIAGNOSIS — I4891 Unspecified atrial fibrillation: Secondary | ICD-10-CM | POA: Diagnosis not present

## 2017-06-11 DIAGNOSIS — F3341 Major depressive disorder, recurrent, in partial remission: Secondary | ICD-10-CM | POA: Diagnosis not present

## 2017-06-11 DIAGNOSIS — N2889 Other specified disorders of kidney and ureter: Secondary | ICD-10-CM

## 2017-06-11 DIAGNOSIS — N183 Chronic kidney disease, stage 3 unspecified: Secondary | ICD-10-CM

## 2017-06-11 DIAGNOSIS — E039 Hypothyroidism, unspecified: Secondary | ICD-10-CM | POA: Diagnosis not present

## 2017-06-11 DIAGNOSIS — M810 Age-related osteoporosis without current pathological fracture: Secondary | ICD-10-CM

## 2017-06-11 DIAGNOSIS — E785 Hyperlipidemia, unspecified: Secondary | ICD-10-CM | POA: Diagnosis not present

## 2017-06-11 LAB — COMPLETE METABOLIC PANEL WITH GFR
AG Ratio: 1.4 (calc) (ref 1.0–2.5)
ALT: 11 U/L (ref 6–29)
AST: 15 U/L (ref 10–35)
Albumin: 3.9 g/dL (ref 3.6–5.1)
Alkaline phosphatase (APISO): 66 U/L (ref 33–130)
BUN: 17 mg/dL (ref 7–25)
CO2: 31 mmol/L (ref 20–32)
Calcium: 9.4 mg/dL (ref 8.6–10.4)
Chloride: 103 mmol/L (ref 98–110)
Creat: 0.89 mg/dL (ref 0.60–0.93)
GFR, Est African American: 72 mL/min/{1.73_m2} (ref >=60)
GFR, Est Non African American: 63 mL/min/{1.73_m2} (ref >=60)
Globulin: 2.7 g/dL (calc) (ref 1.9–3.7)
Glucose, Bld: 99 mg/dL (ref 65–99)
Potassium: 4.5 mmol/L (ref 3.5–5.3)
Sodium: 141 mmol/L (ref 135–146)
Total Bilirubin: 0.6 mg/dL (ref 0.2–1.2)
Total Protein: 6.6 g/dL (ref 6.1–8.1)

## 2017-06-11 LAB — CBC WITH DIFFERENTIAL/PLATELET
Basophils Absolute: 68 cells/uL (ref 0–200)
Basophils Relative: 0.9 %
Eosinophils Absolute: 210 cells/uL (ref 15–500)
Eosinophils Relative: 2.8 %
HCT: 40.8 % (ref 35.0–45.0)
Hemoglobin: 13.3 g/dL (ref 11.7–15.5)
Lymphs Abs: 2625 cells/uL (ref 850–3900)
MCH: 27.5 pg (ref 27.0–33.0)
MCHC: 32.6 g/dL (ref 32.0–36.0)
MCV: 84.3 fL (ref 80.0–100.0)
MPV: 10.1 fL (ref 7.5–12.5)
Monocytes Relative: 10.1 %
Neutro Abs: 3840 cells/uL (ref 1500–7800)
Neutrophils Relative %: 51.2 %
Platelets: 186 10*3/uL (ref 140–400)
RBC: 4.84 10*6/uL (ref 3.80–5.10)
RDW: 12.2 % (ref 11.0–15.0)
Total Lymphocyte: 35 %
WBC mixed population: 758 cells/uL (ref 200–950)
WBC: 7.5 10*3/uL (ref 3.8–10.8)

## 2017-06-11 LAB — LIPID PANEL
Cholesterol: 174 mg/dL (ref <200)
HDL: 41 mg/dL — ABNORMAL LOW (ref >50)
LDL Cholesterol (Calc): 104 mg/dL (calc) — ABNORMAL HIGH
Non-HDL Cholesterol (Calc): 133 mg/dL (calc) — ABNORMAL HIGH (ref <130)
Total CHOL/HDL Ratio: 4.2 (calc) (ref <5.0)
Triglycerides: 175 mg/dL — ABNORMAL HIGH (ref <150)

## 2017-06-11 LAB — TSH: TSH: 0.05 mIU/L — ABNORMAL LOW (ref 0.40–4.50)

## 2017-06-11 NOTE — Progress Notes (Signed)
Location:  [facility] clinic Provider:  Tiffany L. Mariea Clonts, D.O., C.M.D.  Code Status: Full Code Goals of Care:  Advanced Directives 05/16/2017  Does Patient Have a Medical Advance Directive? Yes  Type of Paramedic of [place];Living will  Does patient want to make changes to medical advance directive? No - Patient declined  Copy of [place] in Chart? No - copy requested  Would patient like information on creating a medical advance directive? -     Chief Complaint  Patient presents with  . Medical Management of Chronic Issues    84mth follow-up    HPI: Patient is a 77 y.o. female seen today for medical management of chronic diseases. Today at visit patient feels better since implementing the changes from Sept. She is currently off from working at the [employer] due to the winter months. She has taking up watching her great grand daughter, who is 19 months old, 5 days a week.   She is lives alone and handles all ADLs and IADLs without difficulty. She says she is improving with feelings of depression and notes rare times of feeling bad. She says her anxiety is better and that she does not need the xanax daily. She states her energy has improved and she is tolerating the increase in her synthroid. She does not have trouble with any of her medicines now that she change the time of her pravachol. She is remembering to take it daily now. She is maintained on eliquis for Afib. She denies signs of bleed stool or urine. Denies nose bleeds.   Past Medical History:  Diagnosis Date  . Anxiety   . Atrial fibrillation with rapid ventricular response ([place]) 07/07/2015  . Bilateral breast cancer ([place])   . Daily headache   . Depression   . GERD (gastroesophageal reflux disease)   . Hyperlipidemia   . Hypertension   . Hypothyroidism   . Iron deficiency anemia 2000s  . Lumbago   . Nausea alone   . Senile osteoporosis   . Squamous cell carcinoma of forehead      "I think they burned it off"    Past Surgical History:  Procedure Laterality Date  . ABDOMINAL HYSTERECTOMY  1970s  . BREAST BIOPSY Bilateral   . BREAST LUMPECTOMY Right 2002   "w/lymph nodes removed"  . CARDIOVERSION N/A 08/19/2015   Procedure: CARDIOVERSION;  Surgeon: Sanda Klein, MD;  Location: [place];  Service: Cardiovascular;  Laterality: N/A;  . CATARACT EXTRACTION W/ INTRAOCULAR LENS  IMPLANT, BILATERAL Bilateral 2000s  . DILATION AND CURETTAGE OF UTERUS  ~ 1968  . ELBOW FRACTURE SURGERY Left 2011   "has 2 screws in it"  . EXCISIONAL HEMORRHOIDECTOMY  ~ 1970  . FRACTURE SURGERY  02/12/2016   Right Wrist  . HEMIARTHROPLASTY HIP Left 2011   "had ball replaced"  . INGUINAL HERNIA REPAIR Right 1980s?  [name] MASTECTOMY Left 2005  . ORIF WRIST FRACTURE Left 02/12/2016   Procedure: OPEN REDUCTION INTERNAL FIXATION LEFT DISTAL RADIUS;  Surgeon: Dayna Barker, MD;  Location: [place];  Service: Plastics;  Laterality: Left;  . TOTAL THYROIDECTOMY  1972  . WRIST FRACTURE SURGERY Right    "has a plate and 7 screws in there"    No Known Allergies  Outpatient Encounter Medications as of 06/11/2017  Medication Sig  . acetaminophen (TYLENOL) 325 MG tablet Take 2 tablets (650 mg total) by mouth every 4 (four) hours as needed for headache or mild pain.  [name]  ALPRAZolam (XANAX) 0.5 MG tablet TAKE 1 TABLET BY MOUTH DAILY FOR ANXIETY  . amLODipine (NORVASC) 5 MG tablet Take 1 tablet (5 mg total) by mouth daily.  . Cholecalciferol (VITAMIN D3) 2000 UNITS TABS Take 1 tablet by mouth daily.   [name] ELIQUIS 5 MG TABS tablet TAKE 1 TABLET BY MOUTH TWICE DAILY  . levothyroxine (SYNTHROID, LEVOTHROID) 150 MCG tablet Take one tablet by mouth 30 minutes before breakfast once daily for thyroid  . metoprolol tartrate (LOPRESSOR) 50 MG tablet TAKE 1 TABLET BY MOUTH TWICE DAILY  . pravastatin (PRAVACHOL) 40 MG tablet Take 1 tablet (40 mg total) by mouth daily.  . ranitidine (ZANTAC) 75 MG tablet Take 75 mg  by mouth 2 (two) times daily as needed (For Heartburn).   . sertraline (ZOLOFT) 25 MG tablet Take 2 tablets (50 mg total) by mouth daily.  [name] triamterene-hydrochlorothiazide (MAXZIDE-25) 37.5-25 MG tablet Take 0.5 tablets by mouth daily.   No facility-administered encounter medications on file as of 06/11/2017.     Review of Systems:  Review of Systems  Constitutional: Negative for chills and fever.  HENT: Negative for congestion and ear pain.   Eyes: Negative for blurred vision.       Wear glasses, needs to be seen  Respiratory: Negative for cough and shortness of breath.   Cardiovascular: Negative for chest pain, palpitations and leg swelling.  Gastrointestinal: Negative for abdominal pain, blood in stool, constipation and vomiting.  Genitourinary: Negative for frequency, hematuria and urgency.  Musculoskeletal: Negative.  Negative for falls.  Neurological: Negative.  Negative for headaches.  Endo/Heme/Allergies: Bruises/bleeds easily.  Psychiatric/Behavioral: Positive for depression. The patient is nervous/anxious.        Depression and anxiety have improved, is feeling it is overall controlled.     Health Maintenance  Topic Date Due  . TETANUS/TDAP  01/24/2020  . INFLUENZA VACCINE  Completed  . DEXA SCAN  Completed  . PNA vac Low Risk Adult  Completed    Physical Exam: Vitals:   06/11/17 1004  BP: 122/78  Pulse: (!) 54  Temp: 98 F (36.7 C)  TempSrc: Oral  SpO2: 95%  Weight: 168 lb (76.2 kg)   Body mass index is 28.84 kg/m. Physical Exam  Constitutional: She is oriented to person, place, and time. She appears well-developed and well-nourished.  HENT:  Head: Normocephalic and atraumatic.  Eyes: Conjunctivae are normal.  Neck: Normal range of motion. Neck supple.  Cardiovascular: Normal rate, regular rhythm, normal heart sounds and intact distal pulses.  Left leg is slightly larger than Right, secondary to the hip fracture.   There is some sock line edema.     Pulmonary/Chest: Effort normal and breath sounds normal.  Abdominal: Soft. Bowel sounds are normal.  Musculoskeletal: Normal range of motion.  Neurological: She is alert and oriented to person, place, and time.  Date, [place] all correct.   Skin: Skin is warm and dry.  Psychiatric: She has a normal mood and affect. Her behavior is normal. Judgment and thought content normal.    Filed Weights   06/11/17 1004  Weight: 168 lb (76.2 kg)    Labs reviewed: Basic Metabolic Panel: Recent Labs    02/05/17 0836  NA 139  K 4.0  CL 104  CO2 26  GLUCOSE 92  BUN 14  CREATININE 1.03*  CALCIUM 8.9  TSH 9.72*   Liver Function Tests: Recent Labs    02/05/17 0836  AST 21  ALT 16  BILITOT 0.6  PROT  6.4   No results for input(s): LIPASE, AMYLASE in the last 8760 hours. No results for input(s): AMMONIA in the last 8760 hours. CBC: Recent Labs    02/05/17 0836  WBC 6.5  NEUTROABS 3,289  HGB 13.6  HCT 41.6  MCV 83.9  PLT 174   Lipid Panel: Recent Labs    02/05/17 0836  CHOL 202*  HDL 47*  TRIG 140  CHOLHDL 4.3   Lab Results  Component Value Date   HGBA1C 5.8 (H) 11/22/2015    Procedures since last visit: No results found.  Assessment/Plan 1. Atrial fibrillation with rapid ventricular response (HCC) Maintained on Metoprolol and Eliquis. She is tolerating this well. She is in rhythm today on exam. She denies signs of bleeding, but notes easy bruising. Will assess hemoglobin and hematocrit.   - CBC with Differential/Platelet  2. Recurrent major depressive disorder, in partial remission ([place]) She notes feeling much improved. She is smiling during communication today. She only has to use of xanax occassionally. She feels Zoloft is helping her greatly. Will maintain these medicines at this time.    3. Hypothyroidism, unspecified type Previous office visit labs demonstrated elevation in TSH. She was also having some  Fatigue. Her dose of synthroid was increased  and she tolerated it well and has noticed some improvement with her energy. Will reassess these changes on TSH level today.  - TSH  4. Hyperlipidemia with target LDL less than 100 Her LDL was elevated on the lipid panel from Sept 2018. She was having trouble remembering to take her pill in the evening. So it was changed to take in the morning. She states this is better and she has not missed a dose. Will assess this regimen change today with lipid panel as patient is fasting. If stable will maintain pravachol at this time. Advised about eating well and avoiding fried, high fat, and baked sweets.  - Lipid panel  5. Senile osteoporosis She is maintained on vitamin D. She moves around and gets some weightbearing with lifting her 20 pound great grand daughter. She is not doing any form of exercise outside of this. But she demonstrates good strength and grip on exam today. Furthermore, she denies falls or trouble walking. She has a hx of Left hip fracture. Encouraged to stay active.   6. Chronic renal insufficiency, stage III (moderate) (HCC) She has had some slight elevation in creatinine over the past years with labs and her GFR was 52.  Will assess current kidney function today.  - CBC with Differential/Platelet - COMPLETE METABOLIC PANEL WITH GFR    Labs/tests ordered:   Orders Placed This Encounter  Procedures  . CBC with Differential/Platelet  . COMPLETE METABOLIC PANEL WITH GFR  . TSH  . Lipid panel    Next appt:  Visit date not found.  10/11/2017    Karen Kays, MSN, RN, DNP [facility] [phone], [place] [address] Cell Phone (Mon-Fri 8am-5pm):  [phone] On Call:  [phone] & follow prompts after 5pm & weekends Office Phone:  [phone] Office Fax:  [phone]

## 2017-08-02 ENCOUNTER — Telehealth: Payer: Self-pay | Admitting: *Deleted

## 2017-08-02 NOTE — Telephone Encounter (Signed)
Patient called and stated that she has had a cough for several weeks, non productive, and a oncoming sinus infection. Wants antibiotic called to pharmacy.  I informed patient we couldn't call in an antibiotic without seeing her first and offered an appointment for in the morning with Janett Billow.  Patient refused appointment and stated that she would call back if it worsened.

## 2017-08-03 ENCOUNTER — Other Ambulatory Visit: Payer: Self-pay | Admitting: *Deleted

## 2017-08-03 ENCOUNTER — Other Ambulatory Visit: Payer: Self-pay | Admitting: Physician Assistant

## 2017-08-03 DIAGNOSIS — I10 Essential (primary) hypertension: Secondary | ICD-10-CM

## 2017-08-03 MED ORDER — TRIAMTERENE-HCTZ 37.5-25 MG PO TABS: 0.5000 | ORAL_TABLET | Freq: Every day | ORAL | 3 refills | Status: DC

## 2017-08-03 NOTE — Telephone Encounter (Signed)
REFILL 

## 2017-08-03 NOTE — Telephone Encounter (Signed)
[facility] 

## 2017-08-09 ENCOUNTER — Other Ambulatory Visit: Payer: Self-pay | Admitting: *Deleted

## 2017-08-09 MED ORDER — ALPRAZOLAM 0.5 MG PO TABS: ORAL_TABLET | ORAL | 0 refills | Status: DC

## 2017-08-09 NOTE — Telephone Encounter (Signed)
Received refill request from [facility]. Phoned.

## 2017-08-22 ENCOUNTER — Other Ambulatory Visit: Payer: Self-pay | Admitting: *Deleted

## 2017-08-22 DIAGNOSIS — F3289 Other specified depressive episodes: Secondary | ICD-10-CM

## 2017-08-22 MED ORDER — SERTRALINE HCL 25 MG PO TABS: 50.0000 mg | ORAL_TABLET | Freq: Every day | ORAL | 1 refills | Status: DC

## 2017-08-22 NOTE — Telephone Encounter (Signed)
[facility] 

## 2017-09-05 ENCOUNTER — Other Ambulatory Visit: Payer: Self-pay | Admitting: Physician Assistant

## 2017-09-05 NOTE — Telephone Encounter (Signed)
Rx(s) sent to pharmacy electronically.  

## 2017-09-24 ENCOUNTER — Other Ambulatory Visit: Payer: Self-pay | Admitting: Cardiovascular Disease

## 2017-09-24 NOTE — Telephone Encounter (Signed)
REFILL 

## 2017-10-11 ENCOUNTER — Ambulatory Visit: Payer: Medicare HMO | Admitting: Internal Medicine

## 2017-10-22 ENCOUNTER — Encounter: Payer: Self-pay | Admitting: Internal Medicine

## 2017-10-22 ENCOUNTER — Ambulatory Visit (INDEPENDENT_AMBULATORY_CARE_PROVIDER_SITE_OTHER): Payer: Medicare HMO | Admitting: Internal Medicine

## 2017-10-22 VITALS — BP 132/70 | HR 50 | Temp 98.2°F | Ht 64.0 in | Wt 163.0 lb

## 2017-10-22 DIAGNOSIS — I481 Persistent atrial fibrillation: Secondary | ICD-10-CM

## 2017-10-22 DIAGNOSIS — I4819 Other persistent atrial fibrillation: Secondary | ICD-10-CM

## 2017-10-22 DIAGNOSIS — Z23 Encounter for immunization: Secondary | ICD-10-CM

## 2017-10-22 DIAGNOSIS — F3342 Major depressive disorder, recurrent, in full remission: Secondary | ICD-10-CM

## 2017-10-22 DIAGNOSIS — M810 Age-related osteoporosis without current pathological fracture: Secondary | ICD-10-CM

## 2017-10-22 DIAGNOSIS — N183 Chronic kidney disease, stage 3 unspecified: Secondary | ICD-10-CM

## 2017-10-22 DIAGNOSIS — I10 Essential (primary) hypertension: Secondary | ICD-10-CM

## 2017-10-22 DIAGNOSIS — E039 Hypothyroidism, unspecified: Secondary | ICD-10-CM | POA: Diagnosis not present

## 2017-10-22 LAB — TSH: TSH: 0.38 mIU/L — ABNORMAL LOW (ref 0.40–4.50)

## 2017-10-22 NOTE — Patient Instructions (Signed)
Please go get your bone density done July or after (order is in for the [facility]).  We will let you know if you need to change your thyroid medication based on your lab today.

## 2017-10-22 NOTE — Progress Notes (Signed)
Location:  [facility] clinic Provider:  Rianna Lukes L. Mariea Clonts, D.O., C.M.D.  Code Status: DNR based on prior discussions, but has not documentation Goals of Care:  Advanced Directives 10/22/2017  Does Patient Have a Medical Advance Directive? No  Type of Advance Directive -  Does patient want to make changes to medical advance directive? -  Copy of [place] in Chart? -  Would patient like information on creating a medical advance directive? No - Patient declined   Chief Complaint  Patient presents with  . Medical Management of Chronic Issues    59mth follow-up    HPI: Patient is a 77 y.o. female seen today for medical management of chronic diseases.    HR 50--typically is in the 50s.  BP well controlled.  Weight loss down 5 lbs.  She has been more active.  She's not as hungry when she's working.    Kept her grandbaby until farmer's market started back.  She has no concerns.    She is due for her bone density in July of this year--order is already in.  She's [facility] the breast center.    She is remembering her cholesterol pill.  LDL had been improving last time.  TSH was low last visit but we kept her on 155mcg to recheck today  Says mood is fine.    No chest pain, palpitations, weakness or fatigue.    Agrees to Td here today.  Past Medical History:  Diagnosis Date  . Anxiety   . Atrial fibrillation with rapid ventricular response ([place]) 07/07/2015  . Bilateral breast cancer ([place])   . Daily headache   . Depression   . GERD (gastroesophageal reflux disease)   . Hyperlipidemia   . Hypertension   . Hypothyroidism   . Iron deficiency anemia 2000s  . Lumbago   . Nausea alone   . Senile osteoporosis   . Squamous cell carcinoma of forehead    "I think they burned it off"    Past Surgical History:  Procedure Laterality Date  . ABDOMINAL HYSTERECTOMY  1970s  . BREAST BIOPSY Bilateral   . BREAST LUMPECTOMY Right 2002   "w/lymph nodes removed"  .  CARDIOVERSION N/A 08/19/2015   Procedure: CARDIOVERSION;  Surgeon: Sanda Klein, MD;  Location: [place];  Service: Cardiovascular;  Laterality: N/A;  . CATARACT EXTRACTION W/ INTRAOCULAR LENS  IMPLANT, BILATERAL Bilateral 2000s  . DILATION AND CURETTAGE OF UTERUS  ~ 1968  . ELBOW FRACTURE SURGERY Left 2011   "has 2 screws in it"  . EXCISIONAL HEMORRHOIDECTOMY  ~ 1970  . FRACTURE SURGERY  02/12/2016   Right Wrist  . HEMIARTHROPLASTY HIP Left 2011   "had ball replaced"  . INGUINAL HERNIA REPAIR Right 1980s?  [name] MASTECTOMY Left 2005  . ORIF WRIST FRACTURE Left 02/12/2016   Procedure: OPEN REDUCTION INTERNAL FIXATION LEFT DISTAL RADIUS;  Surgeon: Dayna Barker, MD;  Location: [place];  Service: Plastics;  Laterality: Left;  . TOTAL THYROIDECTOMY  1972  . WRIST FRACTURE SURGERY Right    "has a plate and 7 screws in there"    No Known Allergies  Outpatient Encounter Medications as of 10/22/2017  Medication Sig  . acetaminophen (TYLENOL) 325 MG tablet Take 2 tablets (650 mg total) by mouth every 4 (four) hours as needed for headache or mild pain.  [name] ALPRAZolam (XANAX) 0.5 MG tablet Take one tablet by mouth once daily as needed for anxiety  . amLODipine (NORVASC) 5 MG tablet Take 1 tablet (  5 mg total) by mouth daily. KEEP OV.  [name] apixaban (ELIQUIS) 5 MG TABS tablet Take 1 tablet (5 mg total) by mouth 2 (two) times daily. KEEP OV.  [name] Cholecalciferol (VITAMIN D3) 2000 UNITS TABS Take 1 tablet by mouth daily.   [name] levothyroxine (SYNTHROID, LEVOTHROID) 150 MCG tablet Take one tablet by mouth 30 minutes before breakfast once daily for thyroid  . metoprolol tartrate (LOPRESSOR) 50 MG tablet Take 1 tablet (50 mg total) by mouth 2 (two) times daily. KEEP OV.  . pravastatin (PRAVACHOL) 40 MG tablet Take 1 tablet (40 mg total) by mouth daily.  . ranitidine (ZANTAC) 75 MG tablet Take 75 mg by mouth 2 (two) times daily as needed (For Heartburn).   . sertraline (ZOLOFT) 25 MG tablet Take 2 tablets (50 mg  total) by mouth daily.  [name] triamterene-hydrochlorothiazide (MAXZIDE-25) 37.5-25 MG tablet Take 0.5 tablets by mouth daily.   No facility-administered encounter medications on file as of 10/22/2017.     Review of Systems:  Review of Systems  Constitutional: Negative for chills, fever and malaise/fatigue.  HENT: Negative for congestion and hearing loss.   Eyes: Negative for blurred vision.  Respiratory: Negative for cough and shortness of breath.   Cardiovascular: Positive for leg swelling. Negative for chest pain, palpitations, orthopnea, claudication and PND.  Gastrointestinal: Negative for abdominal pain, blood in stool, constipation, diarrhea and melena.  Genitourinary: Negative for dysuria.  Musculoskeletal: Negative for falls and joint pain.  Skin: Negative for itching and rash.  Neurological: Negative for dizziness and loss of consciousness.  Endo/Heme/Allergies: Bruises/bleeds easily.  Psychiatric/Behavioral: Negative for depression and memory loss. The patient is not nervous/anxious and does not have insomnia.     Health Maintenance  Topic Date Due  . INFLUENZA VACCINE  12/20/2017  . TETANUS/TDAP  01/24/2020  . DEXA SCAN  Completed  . PNA vac Low Risk Adult  Completed    Physical Exam: Vitals:   10/22/17 0825  BP: 132/70  Pulse: (!) 50  Temp: 98.2 F (36.8 C)  TempSrc: Oral  SpO2: 96%  Weight: 163 lb (73.9 kg)  Height: 5\' 4"  (1.626 m)   Body mass index is 27.98 kg/m. Physical Exam  Constitutional: She is oriented to person, place, and time. She appears well-developed and well-nourished. No distress.  HENT:  Head: Normocephalic and atraumatic.  Eyes:  glasses  Cardiovascular: Intact distal pulses.  Loletha Grayer, irreg irreg; nonpitting edema of bilateral ankles  Pulmonary/Chest: Effort normal and breath sounds normal. No respiratory distress.  Abdominal: Soft. Bowel sounds are normal. She exhibits no distension. There is no tenderness.  Musculoskeletal: Normal range  of motion. She exhibits no tenderness.  Neurological: She is alert and oriented to person, place, and time.  Skin: Skin is warm and dry. Capillary refill takes less than 2 seconds.  Psychiatric: She has a normal mood and affect.    Labs reviewed: Basic Metabolic Panel: Recent Labs    02/05/17 0836 06/11/17 1106  NA 139 141  K 4.0 4.5  CL 104 103  CO2 26 31  GLUCOSE 92 99  BUN 14 17  CREATININE 1.03* 0.89  CALCIUM 8.9 9.4  TSH 9.72* 0.05*   Liver Function Tests: Recent Labs    02/05/17 0836 06/11/17 1106  AST 21 15  ALT 16 11  BILITOT 0.6 0.6  PROT 6.4 6.6   No results for input(s): LIPASE, AMYLASE in the last 8760 hours. No results for input(s): AMMONIA in the last 8760 hours. CBC: Recent Labs  02/05/17 0836 06/11/17 1106  WBC 6.5 7.5  NEUTROABS 3,289 3,840  HGB 13.6 13.3  HCT 41.6 40.8  MCV 83.9 84.3  PLT 174 186   Lipid Panel: Recent Labs    02/05/17 0836 06/11/17 1106  CHOL 202* 174  HDL 47* 41*  LDLCALC 130* 104*  TRIG 140 175*  CHOLHDL 4.3 4.2   Lab Results  Component Value Date   HGBA1C 5.8 (H) 11/22/2015    Assessment/Plan 1. Persistent atrial fibrillation ([place]) -brady and irreg irreg on exam -asymptomatic -continue eliquis for anticoagulation and lopressor for rate control  2. Essential hypertension, benign -bp at goal with current regimen -admits to eating salt which contributes to her edema -works out in the heat so compression hose are not comfortable, but does elevate feet at rest as able  3. Hypothyroidism, unspecified type - TSH was low last check--cont levothyroxine 149mcg daily--taking properly - TSH today to reassess--pt has been feeling better with this dose, but has lost 5 lbs (? Due to too much T4 or increased activity)  4. Senile osteoporosis -cont vitamin D3, some sun at farmer's market and walking exercise -recheck bone density July or after at breast center  5. Chronic renal insufficiency, stage III (moderate)  (HCC) -cont to hydrate, avoid nsaids, bp well controlled, monitor  6. Recurrent major depressive disorder, in full remission ([place]) -cont zoloft therapy which has been very effective along with more time with grandchildren and xanax for anxiety daily as needed (a lot of stress in her family with her son's illness)  7. Need for Td vaccine -given today  Labs/tests ordered:   Orders Placed This Encounter  Procedures  . TSH  Td  Next appt:  4 mos med mgt  Amire Leazer L. Shyane Fossum, D.O. [facility] [address], [place] [address] Cell Phone (Mon-Fri 8am-5pm):  [phone] On Call:  [phone] & follow prompts after 5pm & weekends Office Phone:  [phone] Office Fax:  [phone]

## 2017-10-22 NOTE — Addendum Note (Signed)
Addended by: Despina Hidden on: 10/22/2017 09:21 AM   Modules accepted: Orders

## 2017-11-05 ENCOUNTER — Encounter: Payer: Self-pay | Admitting: *Deleted

## 2017-11-09 ENCOUNTER — Other Ambulatory Visit: Payer: Self-pay | Admitting: *Deleted

## 2017-11-09 MED ORDER — ALPRAZOLAM 0.5 MG PO TABS: ORAL_TABLET | ORAL | 0 refills | Status: DC

## 2017-11-09 NOTE — Telephone Encounter (Signed)
[facility]

## 2018-01-23 ENCOUNTER — Other Ambulatory Visit: Payer: Self-pay | Admitting: *Deleted

## 2018-01-23 MED ORDER — ALPRAZOLAM 0.5 MG PO TABS: ORAL_TABLET | ORAL | 0 refills | Status: DC

## 2018-01-23 NOTE — Telephone Encounter (Signed)
[facility]

## 2018-02-07 ENCOUNTER — Other Ambulatory Visit: Payer: Self-pay | Admitting: *Deleted

## 2018-02-07 DIAGNOSIS — E038 Other specified hypothyroidism: Secondary | ICD-10-CM

## 2018-02-07 MED ORDER — LEVOTHYROXINE SODIUM 150 MCG PO TABS: ORAL_TABLET | ORAL | 3 refills | Status: DC

## 2018-02-07 NOTE — Telephone Encounter (Signed)
[facility]

## 2018-02-11 ENCOUNTER — Ambulatory Visit: Payer: Medicare HMO | Admitting: Cardiovascular Disease

## 2018-02-11 ENCOUNTER — Encounter: Payer: Self-pay | Admitting: Cardiovascular Disease

## 2018-02-11 VITALS — BP 134/68 | HR 54 | Ht 64.0 in | Wt 161.8 lb

## 2018-02-11 DIAGNOSIS — I481 Persistent atrial fibrillation: Secondary | ICD-10-CM | POA: Diagnosis not present

## 2018-02-11 DIAGNOSIS — E7800 Pure hypercholesterolemia, unspecified: Secondary | ICD-10-CM

## 2018-02-11 DIAGNOSIS — Z7901 Long term (current) use of anticoagulants: Secondary | ICD-10-CM

## 2018-02-11 DIAGNOSIS — I4819 Other persistent atrial fibrillation: Secondary | ICD-10-CM

## 2018-02-11 DIAGNOSIS — I10 Essential (primary) hypertension: Secondary | ICD-10-CM

## 2018-02-11 NOTE — Patient Instructions (Signed)
Medication Instructions: Your physician recommends that you continue on your current medications as directed. Please refer to the Current Medication list given to you today.   Follow-Up: Your physician wants you to follow-up in: 1 year with Dr. Sallyanne Kuster. You will receive a reminder letter in the mail two months in advance. If you don't receive a letter, please call our office to schedule the follow-up appointment.   If you need a refill on your cardiac medications before your next appointment, please call your pharmacy.

## 2018-02-11 NOTE — Progress Notes (Signed)
Patient ID: Deborah Randall, female   DOB: 1941-04-06, 77 y.o.   MRN: [id number]    Cardiology Office Note    Date:  02/13/2018   ID:  Karyna, Deborah Randall Sep 17, 1940, MRN [id number]  PCP:  Deborah Curry, DO  Cardiologist:  Deborah Klein, MD   No chief complaint on file.   History of Present Illness:  Deborah Randall is a 77 y.o. female with recurrent but infrequent paroxysmal atrial fibrillation with rapid ventricular response, initially diagnosed in 2011, s/p previous cardioversion (August 19, 2015).   The patient specifically denies any chest pain at rest exertion, dyspnea at rest or with exertion, orthopnea, paroxysmal nocturnal dyspnea, syncope, palpitations, focal neurological deficits, intermittent claudication, lower extremity edema, unexplained weight gain, cough, hemoptysis or wheezing. Denies bleeding, falls, injuries. Lives independently.  She has well treated hypertension and hypothyroidism and hypercholesterolemia. She has a remote history of left mastectomy and radiation therapy 16 years ago. She has preserved left ventricular systolic function and a moderately dilated left atrium.   Past Medical History:  Diagnosis Date  . Anxiety   . Atrial fibrillation with rapid ventricular response ([place]) 07/07/2015  . Bilateral breast cancer ([place])   . Daily headache   . Depression   . GERD (gastroesophageal reflux disease)   . Hyperlipidemia   . Hypertension   . Hypothyroidism   . Iron deficiency anemia 2000s  . Lumbago   . Nausea alone   . Senile osteoporosis   . Squamous cell carcinoma of forehead    "I think they burned it off"    Past Surgical History:  Procedure Laterality Date  . ABDOMINAL HYSTERECTOMY  1970s  . BREAST BIOPSY Bilateral   . BREAST LUMPECTOMY Right 2002   "w/lymph nodes removed"  . CARDIOVERSION N/A 08/19/2015   Procedure: CARDIOVERSION;  Surgeon: Deborah Klein, MD;  Location: [facility] [place];  Service: Cardiovascular;  Laterality: N/A;  . CATARACT EXTRACTION W/  INTRAOCULAR LENS  IMPLANT, BILATERAL Bilateral 2000s  . DILATION AND CURETTAGE OF UTERUS  ~ 1968  . ELBOW FRACTURE SURGERY Left 2011   "has 2 screws in it"  . EXCISIONAL HEMORRHOIDECTOMY  ~ 1970  . FRACTURE SURGERY  02/12/2016   Right Wrist  . HEMIARTHROPLASTY HIP Left 2011   "had ball replaced"  . INGUINAL HERNIA REPAIR Right 1980s?  [name] MASTECTOMY Left 2005  . ORIF WRIST FRACTURE Left 02/12/2016   Procedure: OPEN REDUCTION INTERNAL FIXATION LEFT DISTAL RADIUS;  Surgeon: Dayna Barker, MD;  Location: [facility];  Service: Plastics;  Laterality: Left;  . TOTAL THYROIDECTOMY  1972  . WRIST FRACTURE SURGERY Right    "has a plate and 7 screws in there"    Current Medications: Outpatient Medications Prior to Visit  Medication Sig Dispense Refill  . acetaminophen (TYLENOL) 325 MG tablet Take 2 tablets (650 mg total) by mouth every 4 (four) hours as needed for headache or mild pain.    [name] ALPRAZolam (XANAX) 0.5 MG tablet Take one tablet by mouth once daily as needed for anxiety 30 tablet 0  . amLODipine (NORVASC) 5 MG tablet Take 1 tablet (5 mg total) by mouth daily. KEEP OV. 30 tablet 5  . apixaban (ELIQUIS) 5 MG TABS tablet Take 1 tablet (5 mg total) by mouth 2 (two) times daily. KEEP OV. 60 tablet 5  . Cholecalciferol (VITAMIN D3) 2000 UNITS TABS Take 1 tablet by mouth daily.     [name] levothyroxine (SYNTHROID, LEVOTHROID) 150 MCG tablet Take one tablet by mouth 30  minutes before breakfast once daily for thyroid 90 tablet 3  . metoprolol tartrate (LOPRESSOR) 50 MG tablet Take 1 tablet (50 mg total) by mouth 2 (two) times daily. KEEP OV. 60 tablet 5  . pravastatin (PRAVACHOL) 40 MG tablet Take 1 tablet (40 mg total) by mouth daily. 90 tablet 1  . ranitidine (ZANTAC) 75 MG tablet Take 75 mg by mouth 2 (two) times daily as needed (For Heartburn).     . sertraline (ZOLOFT) 25 MG tablet Take 2 tablets (50 mg total) by mouth daily. 180 tablet 1  . triamterene-hydrochlorothiazide (MAXZIDE-25) 37.5-25 MG  tablet Take 0.5 tablets by mouth daily. 30 tablet 3   No facility-administered medications prior to visit.      Allergies:   Patient has no known allergies.   Social History   Socioeconomic History  . Marital status: Widowed    Spouse name: Not on file  . Number of children: Not on file  . Years of education: Not on file  . Highest education level: Not on file  Occupational History  . 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 Financial resource strain: Not hard at all  . Food insecurity:    Worry: Never true    Inability: Never true  . Transportation needs:    Medical: No    Non-medical: No  Tobacco Use  . Smoking status: Never Smoker  . Smokeless tobacco: Never Used  Substance and Sexual Activity  . Alcohol use: No  . Drug use: No  . Sexual activity: Never  Lifestyle  . Physical activity:    Days per week: 0 days    Minutes per session: 0 min  . Stress: Not at all  Relationships  . Social connections:    Talks on phone: More than three times a week    Gets together: Three times a week    Attends religious service: 1 to 4 times per year    Active member of club or organization: Yes    Attends meetings of clubs or organizations: Never    Relationship status: Widowed  Other Topics Concern  . Not on file  Social History Narrative   Lives alone   Widow   2 sons , one son decease, one son disable    Works at [employer] in [employer]   Enjoys going places with friends   Never smoked   Alcohol none   Exercise -walk, works in yard      Family History:  The patient's family history includes COPD in her brother; Cancer in her father and sister; Diabetes in her brother; Heart attack in her brother; Heart disease in her sister; Hypertension in her sister; Mental illness in her son and son; Other in her brother, brother, and brother.   ROS:   Please see the history of present illness.    ROS All other systems reviewed and are  negative.   PHYSICAL EXAM:   VS:  BP 134/68 (BP Location: Left Arm, Patient Position: Sitting, Cuff Size: Normal)   Pulse (!) 54   Ht 5\' 4"  (1.626 m)   Wt 161 lb 12.8 oz (73.4 kg)   BMI 27.77 kg/m     General: Alert, oriented x3, no distress, minimally overweight Head: no evidence of trauma, PERRL, EOMI, no exophtalmos or lid lag, no myxedema, no xanthelasma; normal ears, nose and oropharynx Neck: normal jugular venous pulsations and no hepatojugular reflux; brisk carotid pulses without delay and no carotid bruits Chest:  clear to auscultation, no signs of consolidation by percussion or palpation, normal fremitus, symmetrical and full respiratory excursions Cardiovascular: normal position and quality of the apical impulse, regular rhythm, normal first and second heart sounds, no murmurs, rubs or gallops Abdomen: no tenderness or distention, no masses by palpation, no abnormal pulsatility or arterial bruits, normal bowel sounds, no hepatosplenomegaly Extremities: no clubbing, cyanosis or edema; 2+ radial, ulnar and brachial pulses bilaterally; 2+ right femoral, posterior tibial and dorsalis pedis pulses; 2+ left femoral, posterior tibial and dorsalis pedis pulses; no subclavian or femoral bruits Neurological: grossly nonfocal Psych: Normal mood and affect   Wt Readings from Last 3 Encounters:  02/11/18 161 lb 12.8 oz (73.4 kg)  10/22/17 163 lb (73.9 kg)  06/11/17 168 lb (76.2 kg)      Studies/Labs Reviewed:   EKG:  EKG is ordered today.  The ekg ordered today demonstrates mild sinus bradycardia, mild prolonged PR (212 ms). Borderline QTc (460 ms), otherwise normal.  Recent Labs: 06/11/2017: ALT 11; BUN 17; Creat 0.89; Hemoglobin 13.3; Platelets 186; Potassium 4.5; Sodium 141 10/22/2017: TSH 0.38   Lipid Panel    Component Value Date/Time   CHOL 174 06/11/2017 1106   CHOL 155 11/22/2015 0858   TRIG 175 (H) 06/11/2017 1106   HDL 41 (L) 06/11/2017 1106   HDL 46 11/22/2015 0858    CHOLHDL 4.2 06/11/2017 1106   VLDL 17 07/08/2015 0535   LDLCALC 104 (H) 06/11/2017 1106      ASSESSMENT:    1. Persistent atrial fibrillation ([place])   2. Long term (current) use of anticoagulants   3. Essential hypertension, benign   4. Hypercholesterolemia      PLAN:  In order of problems listed above:  1. AFib: no clinically significant events in over 2 years. Her episodes are infrequent and antiarrhythmic therapy does not appear to be justified at this time. Compliant with anticoagulation therapy, which is recommended lifelong (CHADSVasc score 4: age 53, gender, HTN). 2. Anticoagulation: No bleeding problem. No falls recently. Asked her to report falls promptly and immediately seek medical attention if she has overt bleeding or any head injury since she is taking anticoagulant 3. HTN: fair control. 4. HLP: accepatble LDL-C (no known vascular disease) on current statin regimen    Medication Adjustments/Labs and Tests Ordered: Current medicines are reviewed at length with the patient today.  Concerns regarding medicines are outlined above.  Medication changes, Labs and Tests ordered today are listed in the Patient Instructions below. Patient Instructions  Medication Instructions: Your physician recommends that you continue on your current medications as directed. Please refer to the Current Medication list given to you today.   Follow-Up: Your physician wants you to follow-up in: 1 year with Dr. Sallyanne Kuster. You will receive a reminder letter in the mail two months in advance. If you don't receive a letter, please call our office to schedule the follow-up appointment.   If you need a refill on your cardiac medications before your next appointment, please call your pharmacy.     Signed, Deborah Klein, MD  02/13/2018 3:44 PM    [address], [place], [place]  [address] Phone: [phone]; Fax: [phone]

## 2018-02-21 ENCOUNTER — Other Ambulatory Visit: Payer: Self-pay | Admitting: Internal Medicine

## 2018-02-21 DIAGNOSIS — F3289 Other specified depressive episodes: Secondary | ICD-10-CM

## 2018-03-07 ENCOUNTER — Ambulatory Visit: Payer: Medicare HMO | Admitting: Internal Medicine

## 2018-03-08 ENCOUNTER — Ambulatory Visit
Admission: RE | Admit: 2018-03-08 | Discharge: 2018-03-08 | Disposition: A | Discharge status: Home / Self Care | Payer: Medicare HMO | Source: Ambulatory Visit | Attending: Internal Medicine | Admitting: Internal Medicine

## 2018-03-08 DIAGNOSIS — M810 Age-related osteoporosis without current pathological fracture: Secondary | ICD-10-CM

## 2018-03-08 DIAGNOSIS — Z780 Asymptomatic menopausal state: Secondary | ICD-10-CM | POA: Diagnosis not present

## 2018-03-25 ENCOUNTER — Other Ambulatory Visit: Payer: Self-pay | Admitting: Cardiovascular Disease

## 2018-04-04 ENCOUNTER — Encounter: Payer: Self-pay | Admitting: Internal Medicine

## 2018-04-04 ENCOUNTER — Ambulatory Visit (INDEPENDENT_AMBULATORY_CARE_PROVIDER_SITE_OTHER): Payer: Medicare HMO | Admitting: Internal Medicine

## 2018-04-04 VITALS — BP 130/86 | HR 50 | Temp 98.4°F | Ht 64.0 in | Wt 160.0 lb

## 2018-04-04 DIAGNOSIS — E7800 Pure hypercholesterolemia, unspecified: Secondary | ICD-10-CM | POA: Diagnosis not present

## 2018-04-04 DIAGNOSIS — E039 Hypothyroidism, unspecified: Secondary | ICD-10-CM | POA: Diagnosis not present

## 2018-04-04 DIAGNOSIS — N183 Chronic kidney disease, stage 3 unspecified: Secondary | ICD-10-CM

## 2018-04-04 DIAGNOSIS — I4819 Other persistent atrial fibrillation: Secondary | ICD-10-CM | POA: Diagnosis not present

## 2018-04-04 DIAGNOSIS — M810 Age-related osteoporosis without current pathological fracture: Secondary | ICD-10-CM | POA: Diagnosis not present

## 2018-04-04 DIAGNOSIS — I10 Essential (primary) hypertension: Secondary | ICD-10-CM | POA: Diagnosis not present

## 2018-04-04 DIAGNOSIS — Z23 Encounter for immunization: Secondary | ICD-10-CM | POA: Diagnosis not present

## 2018-04-04 DIAGNOSIS — N2889 Other specified disorders of kidney and ureter: Secondary | ICD-10-CM

## 2018-04-04 LAB — COMPLETE METABOLIC PANEL WITH GFR
AG Ratio: 1.4 (calc) (ref 1.0–2.5)
ALT: 10 U/L (ref 6–29)
AST: 15 U/L (ref 10–35)
Albumin: 4.1 g/dL (ref 3.6–5.1)
Alkaline phosphatase (APISO): 75 U/L (ref 33–130)
BUN/Creatinine Ratio: 12 (calc) (ref 6–22)
BUN: 13 mg/dL (ref 7–25)
CO2: 30 mmol/L (ref 20–32)
Calcium: 9.7 mg/dL (ref 8.6–10.4)
Chloride: 102 mmol/L (ref 98–110)
Creat: 1.06 mg/dL — ABNORMAL HIGH (ref 0.60–0.93)
GFR, Est African American: 58 mL/min/{1.73_m2} — ABNORMAL LOW (ref >=60)
GFR, Est Non African American: 50 mL/min/{1.73_m2} — ABNORMAL LOW (ref >=60)
Globulin: 2.9 g/dL (calc) (ref 1.9–3.7)
Glucose, Bld: 90 mg/dL (ref 65–99)
Potassium: 4.8 mmol/L (ref 3.5–5.3)
Sodium: 140 mmol/L (ref 135–146)
Total Bilirubin: 0.6 mg/dL (ref 0.2–1.2)
Total Protein: 7 g/dL (ref 6.1–8.1)

## 2018-04-04 LAB — CBC WITH DIFFERENTIAL/PLATELET
Basophils Absolute: 95 cells/uL (ref 0–200)
Basophils Relative: 0.9 %
Eosinophils Absolute: 307 cells/uL (ref 15–500)
Eosinophils Relative: 2.9 %
HCT: 40 % (ref 35.0–45.0)
Hemoglobin: 12.4 g/dL (ref 11.7–15.5)
Lymphs Abs: 2311 cells/uL (ref 850–3900)
MCH: 24.7 pg — ABNORMAL LOW (ref 27.0–33.0)
MCHC: 31 g/dL — ABNORMAL LOW (ref 32.0–36.0)
MCV: 79.5 fL — ABNORMAL LOW (ref 80.0–100.0)
MPV: 10 fL (ref 7.5–12.5)
Monocytes Relative: 9.3 %
Neutro Abs: 6901 cells/uL (ref 1500–7800)
Neutrophils Relative %: 65.1 %
Platelets: 234 10*3/uL (ref 140–400)
RBC: 5.03 10*6/uL (ref 3.80–5.10)
RDW: 14.6 % (ref 11.0–15.0)
Total Lymphocyte: 21.8 %
WBC mixed population: 986 cells/uL — ABNORMAL HIGH (ref 200–950)
WBC: 10.6 10*3/uL (ref 3.8–10.8)

## 2018-04-04 LAB — LIPID PANEL
Cholesterol: 216 mg/dL — ABNORMAL HIGH (ref <200)
HDL: 47 mg/dL — ABNORMAL LOW (ref >50)
LDL Cholesterol (Calc): 139 mg/dL (calc) — ABNORMAL HIGH
Non-HDL Cholesterol (Calc): 169 mg/dL (calc) — ABNORMAL HIGH (ref <130)
Total CHOL/HDL Ratio: 4.6 (calc) (ref <5.0)
Triglycerides: 167 mg/dL — ABNORMAL HIGH (ref <150)

## 2018-04-04 LAB — TSH: TSH: 0.95 mIU/L (ref 0.40–4.50)

## 2018-04-04 MED ORDER — TRIAMTERENE-HCTZ 37.5-25 MG PO TABS: 0.5000 | ORAL_TABLET | Freq: Every day | ORAL | 3 refills | Status: DC

## 2018-04-04 MED ORDER — ALENDRONATE SODIUM 70 MG PO TABS: 70.0000 mg | ORAL_TABLET | ORAL | 11 refills | Status: DC

## 2018-04-04 NOTE — Progress Notes (Signed)
Location:  [facility] clinic Provider:  Reagyn Facemire L. Mariea Clonts, D.O., C.M.D.  Code Status: DNR Goals of Care:  Advanced Directives 04/04/2018  Does Patient Have a Medical Advance Directive? Yes  Type of Advance Directive Living will  Does patient want to make changes to medical advance directive? No - Patient declined  Copy of [place] in Chart? -  Would patient like information on creating a medical advance directive? -   Chief Complaint  Patient presents with  . Medical Management of Chronic Issues    4 month follow-up and discuss BMD   . Immunizations    Flu vaccine     HPI: Patient is a 77 y.o. female seen today for medical management of chronic diseases.    She had been on fosamax in the past--she had been on it so long that she needed the break.  She was tolerating the fosamax well before.  Agrees to resume.  She had one fall since her last appt.  She got up during the night to go to the bathroom.  When she stood, she started to fall, she fell into the shoebox by the bed.  The corner of it stabbed her in the jaw.  She got all red from her jaw into her neck.  No other injury fortunately.  Reports no dizziness first.  Her son checks in on her every few days now.  Discussed life alert button.  Says she's ok right now.  Educated on slowly changing positions so her body can equilibrate.  Has not been using zantac daily--it's been occasional.  Educated about recall and alternatives.  Past Medical History:  Diagnosis Date  . Anxiety   . Atrial fibrillation with rapid ventricular response ([place]) 07/07/2015  . Bilateral breast cancer ([place])   . Daily headache   . Depression   . GERD (gastroesophageal reflux disease)   . Hyperlipidemia   . Hypertension   . Hypothyroidism   . Iron deficiency anemia 2000s  . Lumbago   . Nausea alone   . Senile osteoporosis   . Squamous cell carcinoma of forehead    "I think they burned it off"    Past Surgical History:  Procedure  Laterality Date  . ABDOMINAL HYSTERECTOMY  1970s  . BREAST BIOPSY Bilateral   . BREAST LUMPECTOMY Right 2002   "w/lymph nodes removed"  . CARDIOVERSION N/A 08/19/2015   Procedure: CARDIOVERSION;  Surgeon: Sanda Klein, MD;  Location: [facility];  Service: Cardiovascular;  Laterality: N/A;  . CATARACT EXTRACTION W/ INTRAOCULAR LENS  IMPLANT, BILATERAL Bilateral 2000s  . DILATION AND CURETTAGE OF UTERUS  ~ 1968  . ELBOW FRACTURE SURGERY Left 2011   "has 2 screws in it"  . EXCISIONAL HEMORRHOIDECTOMY  ~ 1970  . FRACTURE SURGERY  02/12/2016   Right Wrist  . HEMIARTHROPLASTY HIP Left 2011   "had ball replaced"  . INGUINAL HERNIA REPAIR Right 1980s?  [name] MASTECTOMY Left 2005  . ORIF WRIST FRACTURE Left 02/12/2016   Procedure: OPEN REDUCTION INTERNAL FIXATION LEFT DISTAL RADIUS;  Surgeon: Dayna Barker, MD;  Location: [facility];  Service: Plastics;  Laterality: Left;  . TOTAL THYROIDECTOMY  1972  . WRIST FRACTURE SURGERY Right    "has a plate and 7 screws in there"    No Known Allergies  Outpatient Encounter Medications as of 04/04/2018  Medication Sig  . acetaminophen (TYLENOL) 325 MG tablet Take 2 tablets (650 mg total) by mouth every 4 (four) hours as needed for headache or mild  pain.  [name] ALPRAZolam (XANAX) 0.5 MG tablet Take one tablet by mouth once daily as needed for anxiety  . amLODipine (NORVASC) 5 MG tablet TAKE 1 TABLET BY MOUTH DAILY  . Cholecalciferol (VITAMIN D3) 2000 UNITS TABS Take 1 tablet by mouth daily.   [name] ELIQUIS 5 MG TABS tablet TAKE 1 TABLET BY MOUTH TWICE DAILY  . levothyroxine (SYNTHROID, LEVOTHROID) 150 MCG tablet Take one tablet by mouth 30 minutes before breakfast once daily for thyroid  . metoprolol tartrate (LOPRESSOR) 50 MG tablet TAKE 1 TABLET BY MOUTH TWICE DAILY  . pravastatin (PRAVACHOL) 40 MG tablet Take 1 tablet (40 mg total) by mouth daily.  . ranitidine (ZANTAC) 75 MG tablet Take 75 mg by mouth 2 (two) times daily as needed (For Heartburn).   .  sertraline (ZOLOFT) 25 MG tablet TAKE 2 TABLETS BY MOUTH DAILY  . triamterene-hydrochlorothiazide (MAXZIDE-25) 37.5-25 MG tablet Take 0.5 tablets by mouth daily.   No facility-administered encounter medications on file as of 04/04/2018.     Review of Systems:  Review of Systems  Constitutional: Negative for chills and fever.  HENT: Negative for congestion.   Eyes: Negative for blurred vision.  Respiratory: Negative for shortness of breath.   Cardiovascular: Negative for chest pain, palpitations and leg swelling.  Gastrointestinal: Positive for heartburn. Negative for abdominal pain, blood in stool, constipation, diarrhea, melena, nausea and vomiting.       Occasional gerd  Genitourinary: Negative for dysuria.  Musculoskeletal: Positive for falls. Negative for joint pain.  Neurological: Negative for dizziness and loss of consciousness.  Endo/Heme/Allergies: Bruises/bleeds easily.  Psychiatric/Behavioral: Negative for depression and memory loss. The patient is not nervous/anxious and does not have insomnia.     Health Maintenance  Topic Date Due  . INFLUENZA VACCINE  12/20/2017  . TETANUS/TDAP  10/23/2027  . DEXA SCAN  Completed  . PNA vac Low Risk Adult  Completed    Physical Exam: Vitals:   04/04/18 0754  BP: 130/86  Pulse: (!) 50  Temp: 98.4 F (36.9 C)  TempSrc: Oral  SpO2: 98%  Weight: 160 lb (72.6 kg)  Height: 5\' 4"  (1.626 m)   Body mass index is 27.46 kg/m. Physical Exam  Constitutional: She is oriented to person, place, and time. She appears well-developed and well-nourished. No distress.  HENT:  Head: Normocephalic and atraumatic.  Cardiovascular: Normal rate, regular rhythm, normal heart sounds and intact distal pulses.  Regular to me today  Pulmonary/Chest: Effort normal and breath sounds normal. No respiratory distress.  Abdominal: Bowel sounds are normal.  Musculoskeletal: Normal range of motion.  Neurological: She is alert and oriented to person,  place, and time.  Skin: Skin is warm and dry.  Psychiatric: She has a normal mood and affect.  Smiling some today    Labs reviewed: Basic Metabolic Panel: Recent Labs    06/11/17 1106 10/22/17 0906  NA 141  --   K 4.5  --   CL 103  --   CO2 31  --   GLUCOSE 99  --   BUN 17  --   CREATININE 0.89  --   CALCIUM 9.4  --   TSH 0.05* 0.38*   Liver Function Tests: Recent Labs    06/11/17 1106  AST 15  ALT 11  BILITOT 0.6  PROT 6.6   No results for input(s): LIPASE, AMYLASE in the last 8760 hours. No results for input(s): AMMONIA in the last 8760 hours. CBC: Recent Labs    06/11/17 1106  WBC 7.5  NEUTROABS 3,840  HGB 13.3  HCT 40.8  MCV 84.3  PLT 186   Lipid Panel: Recent Labs    06/11/17 1106  CHOL 174  HDL 41*  LDLCALC 104*  TRIG 175*  CHOLHDL 4.2   Lab Results  Component Value Date   HGBA1C 5.8 (H) 11/22/2015    Procedures since last visit: Dg Bone Density  Result Date: 03/08/2018 EXAM: DUAL X-RAY ABSORPTIOMETRY (DXA) FOR BONE MINERAL DENSITY IMPRESSION: Referring Physician:  Hollace Kinnier Your patient completed a BMD test using Lunar IDXA DXA system ( analysis version: 16 ) manufactured by [employer]. Technologist: AW PATIENT: Name: Deborah Randall, Deborah Randall Patient ID: [id number] Birth Date: 01-13-1941 Height: 63.0 in. Sex: Female Measured: 03/08/2018 Weight: 162.0 lbs. Indications: Advanced Age, Breast Cancer History, Caucasian, Depression, Estrogen Deficient, Family Hist. (Parent hip fracture), Family History of Osteoporosis, Hypothyroid, Hysterectomy, Left hip replaced, Levothyroxine, Osteoporosis (733), Postmenopausal, Zoloft Fractures: Left Hip, Left wrist, Right wrist, Rt forearm Treatments: Vitamin D (E933.5) ASSESSMENT: The BMD measured at AP Spine L2-L3 is 0.828 g/cm2 with a T-score of -3.1. This patient is considered osteoporotic according to [facility]) criteria. There has been a statistically significant decrease in BMD of Right hip  since prior exam dated 08/09/2010. The scan quality is good. DXA exam performed on prior Hologic device measured only unilateral hip (Total Mean was not measured) Therefore current or prior Total Mean cannot be compared. L-1 and L-4 wwere excluded due to degenerative changes. Site Region Measured Date Measured Age YA BMD Significant CHANGE T-score AP Spine    L2-L3  03/08/2018    77.3         -3.1    0.828 g/cm2 Right Femur Total  03/08/2018    78.3         -2.9    0.645 g/cm2 * Right Femur Total  08/09/2010    70.7         -2.2    0.728 g/cm2 World Health Organization [facility]) criteria for post-menopausal, Caucasian Women: Normal       T-score at or above -1 SD Osteopenia   T-score between -1 and -2.5 SD Osteoporosis T-score at or below -2.5 SD RECOMMENDATION: 1. All patients should optimize calcium and vitamin D intake. 2. Consider FDA approved medical therapies in postmenopausal women and men aged 71 years and older, based on the following: a. A hip or vertebral (clinical or morphometric) fracture b. T- score < or = -2.5 at the femoral neck or spine after appropriate evaluation to exclude secondary causes c. Low bone mass (T-score between -1.0 and -2.5 at the femoral neck or spine) and a 10 year probability of a hip fracture > or = 3% or a 10 year probability of a major osteoporosis-related fracture > or = 20% based on the [place]-adapted WHO algorithm d. Clinician judgment and/or patient preferences may indicate treatment for people with 10-year fracture probabilities above or below these levels FOLLOW-UP: People with diagnosed cases of osteoporosis or at high risk for fracture should have regular bone mineral density tests. For patients eligible for Medicare, routine testing is allowed once every 2 years. The testing frequency can be increased to one year for patients who have rapidly progressing disease, those who are receiving or discontinuing medical therapy to restore bone mass, or have additional risk factors. I  have reviewed this report and agree with the above findings. [facility] Radiology Electronically Signed   By: Franki Cabot M.D.   On: 03/08/2018 09:54  Assessment/Plan 1. Senile osteoporosis - had prior wrist fx and also has bone density with t score c/w osteoporosis - has been on a break from tx, but will now resume as she tolerated it well - alendronate (FOSAMAX) 70 MG tablet; Take 1 tablet (70 mg total) by mouth every 7 (seven) days. Take with a full glass of water on an empty stomach.  Dispense: 4 tablet; Refill: 11  2. Essential hypertension, benign - bp controlled with current therapy, refills provided - triamterene-hydrochlorothiazide (MAXZIDE-25) 37.5-25 MG tablet; Take 0.5 tablets by mouth daily.  Dispense: 45 tablet; Refill: 3  3. Hypothyroidism, unspecified type -levels near normal last check, reassess - TSH  4. Persistent atrial fibrillation - seems to be in sinus today, continues on eliquis and lopressor - CBC with Differential/Platelet  5. Chronic renal insufficiency, stage III (moderate) (HCC) - Avoid nephrotoxic agents like nsaids, dose adjust renally excreted meds, hydrate. - CBC with Differential/Platelet - COMPLETE METABOLIC PANEL WITH GFR  6. Hypercholesterolemia - last lipid a bit elevated, does not have known cad, cont pravachol - Lipid panel  7. Need for influenza vaccination - Flu vaccine HIGH DOSE PF (Fluzone High dose)  Labs/tests ordered:   Orders Placed This Encounter  Procedures  . Flu vaccine HIGH DOSE PF (Fluzone High dose)  . CBC with Differential/Platelet  . COMPLETE METABOLIC PANEL WITH GFR  . Lipid panel  . TSH   Next appt:  05/20/2018  Adlai Nieblas L. Jase Himmelberger, D.O. [facility] [address], [place] [address] Cell Phone (Mon-Fri 8am-5pm):  [phone] On Call:  [phone] & follow prompts after 5pm & weekends Office Phone:  [phone] Office Fax:  [phone]

## 2018-04-05 ENCOUNTER — Telehealth: Payer: Self-pay

## 2018-04-05 MED ORDER — ROSUVASTATIN CALCIUM 10 MG PO TABS: 10.0000 mg | ORAL_TABLET | Freq: Every day | ORAL | 3 refills | Status: DC

## 2018-04-05 NOTE — Telephone Encounter (Signed)
Medication list has been updated and refill was sent to pharmacy. Patient was notified.

## 2018-04-05 NOTE — Telephone Encounter (Signed)
-----   Message from Gayland Curry, DO sent at 04/05/2018  1:24 PM EST ----- Ok, let's stop pravachol 40mg  and begin crestor 10mg  in its place.  Please send in and notify her.

## 2018-05-20 ENCOUNTER — Ambulatory Visit (INDEPENDENT_AMBULATORY_CARE_PROVIDER_SITE_OTHER): Payer: Medicare HMO

## 2018-05-20 VITALS — BP 122/62 | HR 71 | Temp 98.6°F | Ht 64.0 in | Wt 164.0 lb

## 2018-05-20 DIAGNOSIS — Z0000 Encounter for general adult medical examination without abnormal findings: Secondary | ICD-10-CM | POA: Diagnosis not present

## 2018-05-20 MED ORDER — ZOSTER VAC RECOMB ADJUVANTED 50 MCG/0.5ML IM SUSR: 0.5000 mL | Freq: Once | INTRAMUSCULAR | 1 refills | Status: AC

## 2018-05-20 NOTE — Patient Instructions (Addendum)
[name] , Thank you for taking time to come for your Medicare Wellness Visit. I appreciate your ongoing commitment to your health goals. Please review the following plan we discussed and let me know if I can assist you in the future.   Screening recommendations/referrals: Colonoscopy excluded, over age 78 Mammogram excluded, over age 14 Bone Density up to date Recommended yearly ophthalmology/optometry visit for glaucoma screening and checkup Recommended yearly dental visit for hygiene and checkup  Vaccinations: Influenza vaccine up to date Pneumococcal vaccine up to date, completed Tdap vaccine up to date, due 10/23/2027 Shingles vaccine due, ordered to pharmacy    Advanced directives: Please bring [place] a copy of your living will and/or health care power of attorney  Conditions/risks identified: none  Next appointment: Dr. Mariea Clonts 09/23/2018 @ 8:30am             Medicare Wellness Visit 05/26/2019 @ 10:45am   Preventive Care 65 Years and Older, Female Preventive care refers to lifestyle choices and visits with your health care provider that can promote health and wellness. What does preventive care include?  A yearly physical exam. This is also called an annual well check.  Dental exams once or twice a year.  Routine eye exams. Ask your health care provider how often you should have your eyes checked.  Personal lifestyle choices, including:  Daily care of your teeth and gums.  Regular physical activity.  Eating a healthy diet.  Avoiding tobacco and drug use.  Limiting alcohol use.  Practicing safe sex.  Taking low-dose aspirin every day.  Taking vitamin and mineral supplements as recommended by your health care provider. What happens during an annual well check? The services and screenings done by your health care provider during your annual well check will depend on your age, overall health, lifestyle risk factors, and family history of disease. Counseling  Your health  care provider may ask you questions about your:  Alcohol use.  Tobacco use.  Drug use.  Emotional well-being.  Home and relationship well-being.  Sexual activity.  Eating habits.  History of falls.  Memory and ability to understand (cognition).  Work and work Statistician.  Reproductive health. Screening  You may have the following tests or measurements:  Height, weight, and BMI.  Blood pressure.  Lipid and cholesterol levels. These may be checked every 5 years, or more frequently if you are over 74 years old.  Skin check.  Lung cancer screening. You may have this screening every year starting at age 60 if you have a 30-pack-year history of smoking and currently smoke or have quit within the past 15 years.  Fecal occult blood test (FOBT) of the stool. You may have this test every year starting at age 65.  Flexible sigmoidoscopy or colonoscopy. You may have a sigmoidoscopy every 5 years or a colonoscopy every 10 years starting at age 30.  Hepatitis C blood test.  Hepatitis B blood test.  Sexually transmitted disease (STD) testing.  Diabetes screening. This is done by checking your blood sugar (glucose) after you have not eaten for a while (fasting). You may have this done every 1-3 years.  Bone density scan. This is done to screen for osteoporosis. You may have this done starting at age 61.  Mammogram. This may be done every 1-2 years. Talk to your health care provider about how often you should have regular mammograms. Talk with your health care provider about your test results, treatment options, and if necessary, the need for more  tests. Vaccines  Your health care provider may recommend certain vaccines, such as:  Influenza vaccine. This is recommended every year.  Tetanus, diphtheria, and acellular pertussis (Tdap, Td) vaccine. You may need a Td booster every 10 years.  Zoster vaccine. You may need this after age 8.  Pneumococcal 13-valent conjugate  (PCV13) vaccine. One dose is recommended after age 32.  Pneumococcal polysaccharide (PPSV23) vaccine. One dose is recommended after age 69. Talk to your health care provider about which screenings and vaccines you need and how often you need them. This information is not intended to replace advice given to you by your health care provider. Make sure you discuss any questions you have with your health care provider. Document Released: 06/04/2015 Document Revised: 01/26/2016 Document Reviewed: 03/09/2015 Elsevier Interactive Patient Education  2017 [place] Prevention in the Home Falls can cause injuries. They can happen to people of all ages. There are many things you can do to make your home safe and to help prevent falls. What can I do on the outside of my home?  Regularly fix the edges of walkways and driveways and fix any cracks.  Remove anything that might make you trip as you walk through a door, such as a raised step or threshold.  Trim any bushes or trees on the path to your home.  Use bright outdoor lighting.  Clear any walking paths of anything that might make someone trip, such as rocks or tools.  Regularly check to see if handrails are loose or broken. Make sure that both sides of any steps have handrails.  Any raised decks and porches should have guardrails on the edges.  Have any leaves, snow, or ice cleared regularly.  Use sand or salt on walking paths during winter.  Clean up any spills in your garage right away. This includes oil or grease spills. What can I do in the bathroom?  Use night lights.  Install grab bars by the toilet and in the tub and shower. Do not use towel bars as grab bars.  Use non-skid mats or decals in the tub or shower.  If you need to sit down in the shower, use a plastic, non-slip stool.  Keep the floor dry. Clean up any water that spills on the floor as soon as it happens.  Remove soap buildup in the tub or shower  regularly.  Attach bath mats securely with double-sided non-slip rug tape.  Do not have throw rugs and other things on the floor that can make you trip. What can I do in the bedroom?  Use night lights.  Make sure that you have a light by your bed that is easy to reach.  Do not use any sheets or blankets that are too big for your bed. They should not hang down onto the floor.  Have a firm chair that has side arms. You can use this for support while you get dressed.  Do not have throw rugs and other things on the floor that can make you trip. What can I do in the kitchen?  Clean up any spills right away.  Avoid walking on wet floors.  Keep items that you use a lot in easy-to-reach places.  If you need to reach something above you, use a strong step stool that has a grab bar.  Keep electrical cords out of the way.  Do not use floor polish or wax that makes floors slippery. If you must use wax, use non-skid floor  wax.  Do not have throw rugs and other things on the floor that can make you trip. What can I do with my stairs?  Do not leave any items on the stairs.  Make sure that there are handrails on both sides of the stairs and use them. Fix handrails that are broken or loose. Make sure that handrails are as long as the stairways.  Check any carpeting to make sure that it is firmly attached to the stairs. Fix any carpet that is loose or worn.  Avoid having throw rugs at the top or bottom of the stairs. If you do have throw rugs, attach them to the floor with carpet tape.  Make sure that you have a light switch at the top of the stairs and the bottom of the stairs. If you do not have them, ask someone to add them for you. What else can I do to help prevent falls?  Wear shoes that:  Do not have high heels.  Have rubber bottoms.  Are comfortable and fit you well.  Are closed at the toe. Do not wear sandals.  If you use a stepladder:  Make sure that it is fully  opened. Do not climb a closed stepladder.  Make sure that both sides of the stepladder are locked into place.  Ask someone to hold it for you, if possible.  Clearly mark and make sure that you can see:  Any grab bars or handrails.  First and last steps.  Where the edge of each step is.  Use tools that help you move around (mobility aids) if they are needed. These include:  Canes.  Walkers.  Scooters.  Crutches.  Turn on the lights when you go into a dark area. Replace any light bulbs as soon as they burn out.  Set up your furniture so you have a clear path. Avoid moving your furniture around.  If any of your floors are uneven, fix them.  If there are any pets around you, be aware of where they are.  Review your medicines with your doctor. Some medicines can make you feel dizzy. This can increase your chance of falling. Ask your doctor what other things that you can do to help prevent falls. This information is not intended to replace advice given to you by your health care provider. Make sure you discuss any questions you have with your health care provider. Document Released: 03/04/2009 Document Revised: 10/14/2015 Document Reviewed: 06/12/2014 Elsevier Interactive Patient Education  2017 [employer].

## 2018-05-20 NOTE — Progress Notes (Signed)
Subjective:   Deborah Randall is a 77 y.o. female who presents for Medicare Annual (Subsequent) preventive examination.  Last AWV-05/16/2017      Objective:     Vitals: BP 122/62 (BP Location: Left Arm, Patient Position: Sitting)   Pulse 71   Temp 98.6 F (37 C) (Oral)   Ht 5\' 4"  (1.626 m)   Wt 164 lb (74.4 kg)   SpO2 98%   BMI 28.15 kg/m   Body mass index is 28.15 kg/m.  Advanced Directives 05/20/2018 04/04/2018 10/22/2017 05/16/2017 02/08/2017 08/31/2016 03/02/2016  Does Patient Have a Medical Advance Directive? Yes Yes No Yes No No Yes  Type of Advance Directive Living will Living will - [place];Living will - - Living will;Healthcare Power of Attorney  Does patient want to make changes to medical advance directive? No - Patient declined No - Patient declined - No - Patient declined - - No - Patient declined  Copy of Healthcare Power of Attorney in Chart? - - - No - copy requested - - No - copy requested  Would patient like information on creating a medical advance directive? - - No - Patient declined - No - Patient declined No - Patient declined -    Tobacco Social History   Tobacco Use  Smoking Status Never Smoker  Smokeless Tobacco Never Used     Counseling given: Not Answered   Clinical Intake:  Pre-visit preparation completed: No  Pain : No/denies pain     Diabetes: No  How often do you need to have someone help you when you read instructions, pamphlets, or other written materials from your doctor or pharmacy?: 1 - Never What is the last grade level you completed in school?: 12th grade  Interpreter Needed?: No  Information entered by :: Tyson Dense, Rn  Past Medical History:  Diagnosis Date  . Anxiety   . Atrial fibrillation with rapid ventricular response ([place]) 07/07/2015  . Bilateral breast cancer ([place])   . Daily headache   . Depression   . GERD (gastroesophageal reflux disease)   . Hyperlipidemia   . Hypertension   .  Hypothyroidism   . Iron deficiency anemia 2000s  . Lumbago   . Nausea alone   . Senile osteoporosis   . Squamous cell carcinoma of forehead    "I think they burned it off"   Past Surgical History:  Procedure Laterality Date  . ABDOMINAL HYSTERECTOMY  1970s  . BREAST BIOPSY Bilateral   . BREAST LUMPECTOMY Right 2002   "w/lymph nodes removed"  . CARDIOVERSION N/A 08/19/2015   Procedure: CARDIOVERSION;  Surgeon: Sanda Klein, MD;  Location: [place];  Service: Cardiovascular;  Laterality: N/A;  . CATARACT EXTRACTION W/ INTRAOCULAR LENS  IMPLANT, BILATERAL Bilateral 2000s  . DILATION AND CURETTAGE OF UTERUS  ~ 1968  . ELBOW FRACTURE SURGERY Left 2011   "has 2 screws in it"  . EXCISIONAL HEMORRHOIDECTOMY  ~ 1970  . FRACTURE SURGERY  02/12/2016   Right Wrist  . HEMIARTHROPLASTY HIP Left 2011   "had ball replaced"  . INGUINAL HERNIA REPAIR Right 1980s?  [name] MASTECTOMY Left 2005  . ORIF WRIST FRACTURE Left 02/12/2016   Procedure: OPEN REDUCTION INTERNAL FIXATION LEFT DISTAL RADIUS;  Surgeon: Dayna Barker, MD;  Location: [place];  Service: Plastics;  Laterality: Left;  . TOTAL THYROIDECTOMY  1972  . WRIST FRACTURE SURGERY Right    "has a plate and 7 screws in there"   Family History  Problem Relation Age  of Onset  . Cancer Father   . Heart attack Brother   . Mental illness Son   . Other Brother   . Other Brother   . Cancer Sister   . Heart disease Sister   . Hypertension Sister   . Other Brother   . Diabetes Brother   . COPD Brother   . Mental illness Son    Social History   Socioeconomic History  . Marital status: Widowed    Spouse name: Not on file  . Number of children: Not on file  . Years of education: Not on file  . Highest education level: Not on file  Occupational History  . 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 Financial resource strain: Not hard at all  . Food insecurity:    Worry: Never true    Inability: Never true  .  Transportation needs:    Medical: No    Non-medical: No  Tobacco Use  . Smoking status: Never Smoker  . Smokeless tobacco: Never Used  Substance and Sexual Activity  . Alcohol use: No  . Drug use: No  . Sexual activity: Never  Lifestyle  . Physical activity:    Days per week: 0 days    Minutes per session: 0 min  . Stress: Not at all  Relationships  . Social connections:    Talks on phone: More than three times a week    Gets together: Three times a week    Attends religious service: 1 to 4 times per year    Active member of club or organization: Yes    Attends meetings of clubs or organizations: Never    Relationship status: Widowed  Other Topics Concern  . Not on file  Social History Narrative   Lives alone   Widow   2 sons , one son decease, one son disable    Works at [employer] in [employer]   Enjoys going places with friends   Never smoked   Alcohol none   Exercise -walk, works in yard     Outpatient Encounter Medications as of 05/20/2018  Medication Sig  . acetaminophen (TYLENOL) 325 MG tablet Take 2 tablets (650 mg total) by mouth every 4 (four) hours as needed for headache or mild pain.  [name] alendronate (FOSAMAX) 70 MG tablet Take 1 tablet (70 mg total) by mouth every 7 (seven) days. Take with a full glass of water on an empty stomach.  . ALPRAZolam (XANAX) 0.5 MG tablet Take one tablet by mouth once daily as needed for anxiety  . amLODipine (NORVASC) 5 MG tablet TAKE 1 TABLET BY MOUTH DAILY  . Cholecalciferol (VITAMIN D3) 2000 UNITS TABS Take 1 tablet by mouth daily.   [name] ELIQUIS 5 MG TABS tablet TAKE 1 TABLET BY MOUTH TWICE DAILY  . levothyroxine (SYNTHROID, LEVOTHROID) 150 MCG tablet Take one tablet by mouth 30 minutes before breakfast once daily for thyroid  . metoprolol tartrate (LOPRESSOR) 50 MG tablet TAKE 1 TABLET BY MOUTH TWICE DAILY  . ranitidine (ZANTAC) 75 MG tablet Take 75 mg by mouth 2 (two) times daily as needed (For Heartburn).   .  rosuvastatin (CRESTOR) 10 MG tablet Take 1 tablet (10 mg total) by mouth daily.  . sertraline (ZOLOFT) 25 MG tablet TAKE 2 TABLETS BY MOUTH DAILY  . triamterene-hydrochlorothiazide (MAXZIDE-25) 37.5-25 MG tablet Take 0.5 tablets by mouth daily.  [name] Zoster Vaccine Adjuvanted [facility]) injection Inject 0.5 mLs into the muscle once for  1 dose.  . [DISCONTINUED] Zoster Vaccine Adjuvanted [facility]) injection Inject 0.5 mLs into the muscle once.   No facility-administered encounter medications on file as of 05/20/2018.     Activities of Daily Living In your present state of health, do you have any difficulty performing the following activities: 05/20/2018  Hearing? N  Vision? N  Difficulty concentrating or making decisions? N  Walking or climbing stairs? N  Dressing or bathing? N  Doing errands, shopping? N  Preparing Food and eating ? N  Using the Toilet? N  In the past six months, have you accidently leaked urine? N  Do you have problems with loss of bowel control? N  Managing your Medications? N  Managing your Finances? N  Housekeeping or managing your Housekeeping? N  Some recent data might be hidden    Patient Care Team: Gayland Curry, DO as PCP - General (Geriatric Medicine) Sanda Klein, MD as Consulting Physician (Cardiology) [place], Amy, MD as Consulting Physician (Dermatology)    Assessment:   This is a routine wellness examination for [facility].  Exercise Activities and Dietary recommendations Current Exercise Habits: The patient does not participate in regular exercise at present, Exercise limited by: None identified  Goals    . Increase water intake     Starting 03/02/16, I will attempt to drink 3 bottles of water per day, instead of 1-2 bottles.    . Weight (lb) < 160 lb (72.6 kg)     Pt will walk more to lose weight       Fall Risk Fall Risk  05/20/2018 04/04/2018 10/22/2017 06/11/2017 05/16/2017  Falls in the past year? 0 1 No No No  Number falls in past yr:  - 0 - - -  Injury with Fall? - 0 - - -  Comment - - - - -  Risk for fall due to : - - - - -  Follow up - - - - -  Comment - - - - -   Is the patient's home free of loose throw rugs in walkways, pet beds, electrical cords, etc?   yes      Grab bars in the bathroom? yes      Handrails on the stairs?   yes      Adequate lighting?   yes  Depression Screen PHQ 2/9 Scores 05/20/2018 10/22/2017 06/11/2017 05/16/2017  PHQ - 2 Score 0 0 0 0  Exception Documentation - - - -     Cognitive Function MMSE - Mini Mental State Exam 05/16/2017 03/02/2016 02/19/2015 09/25/2013  Orientation to time 5 5 5 5   Orientation to Place 5 5 5 5   Registration 3 3 3 3   Attention/ Calculation 5 5 5 5   Recall 1 3 3 2   Language- name 2 objects 2 2 2 2   Language- repeat 1 1 1 1   Language- follow 3 step command 3 3 3 3   Language- read & follow direction 1 1 1 1   Write a sentence 1 1 1 1   Copy design 1 - 1 1  Total score 28 - 30 29        Immunization History  Administered Date(s) Administered  . Influenza, High Dose Seasonal PF 02/08/2017, 04/04/2018  . Influenza,inj,Quad PF,6+ Mos 03/14/2013, 03/26/2014, 02/19/2015, 03/02/2016  . Pneumococcal Conjugate-13 08/02/2013, 08/03/2014  . Pneumococcal Polysaccharide-23 05/22/2009  . Td 10/22/2017  . Zoster 09/25/2013    Qualifies for Shingles Vaccine? Yes, educated and ordered to pharmacy  Screening Tests Health Maintenance  Topic Date Due  . TETANUS/TDAP  10/23/2027  . INFLUENZA VACCINE  Completed  . DEXA SCAN  Completed  . PNA vac Low Risk Adult  Completed    Cancer Screenings: Lung: Low Dose CT Chest recommended if Age 68-80 years, 30 pack-year currently smoking OR have quit w/in 15years. Patient does not qualify. Breast:  Up to date on Mammogram? Yes   Up to date of Bone Density/Dexa? Yes Colorectal: up to date  Additional Screenings:  Hepatitis C Screening: declined     Plan:    I have personally reviewed and addressed the Medicare Annual  Wellness questionnaire and have noted the following in the patient's chart:  A. Medical and social history B. Use of alcohol, tobacco or illicit drugs  C. Current medications and supplements D. Functional ability and status E.  Nutritional status F.  Physical activity G. Advance directives H. List of other physicians I.  Hospitalizations, surgeries, and ER visits in previous 12 months J.  [place] to include hearing, vision, cognitive, depression L. Referrals and appointments - none  In addition, I have reviewed and discussed with patient certain preventive protocols, quality metrics, and best practice recommendations. A written personalized care plan for preventive services as well as general preventive health recommendations were provided to patient.  See attached scanned questionnaire for additional information.   Signed,   Tyson Dense, RN Nurse Health Advisor  Patient Concerns: None

## 2018-07-03 ENCOUNTER — Other Ambulatory Visit: Payer: Self-pay | Admitting: Internal Medicine

## 2018-07-03 NOTE — Telephone Encounter (Signed)
rx called into pharmacy

## 2018-08-05 ENCOUNTER — Other Ambulatory Visit: Payer: Self-pay | Admitting: Internal Medicine

## 2018-08-30 ENCOUNTER — Other Ambulatory Visit: Payer: Self-pay | Admitting: Internal Medicine

## 2018-08-30 DIAGNOSIS — F3289 Other specified depressive episodes: Secondary | ICD-10-CM

## 2018-10-01 ENCOUNTER — Other Ambulatory Visit: Payer: Medicare HMO

## 2018-10-03 ENCOUNTER — Ambulatory Visit: Payer: Medicare HMO | Admitting: Internal Medicine

## 2018-11-23 DIAGNOSIS — R030 Elevated blood-pressure reading, without diagnosis of hypertension: Secondary | ICD-10-CM | POA: Diagnosis not present

## 2018-11-23 DIAGNOSIS — R110 Nausea: Secondary | ICD-10-CM | POA: Diagnosis not present

## 2018-11-23 DIAGNOSIS — R1083 Colic: Secondary | ICD-10-CM | POA: Diagnosis not present

## 2018-11-23 DIAGNOSIS — R42 Dizziness and giddiness: Secondary | ICD-10-CM | POA: Diagnosis not present

## 2018-11-23 DIAGNOSIS — Z20828 Contact with and (suspected) exposure to other viral communicable diseases: Secondary | ICD-10-CM | POA: Diagnosis not present

## 2018-11-29 ENCOUNTER — Other Ambulatory Visit: Payer: Self-pay | Admitting: Internal Medicine

## 2018-11-29 NOTE — Telephone Encounter (Signed)
Last filled 07/03/18 in epic

## 2019-01-30 ENCOUNTER — Other Ambulatory Visit: Payer: Self-pay | Admitting: Internal Medicine

## 2019-02-07 ENCOUNTER — Other Ambulatory Visit: Payer: Self-pay | Admitting: Internal Medicine

## 2019-02-07 DIAGNOSIS — E038 Other specified hypothyroidism: Secondary | ICD-10-CM

## 2019-03-03 ENCOUNTER — Other Ambulatory Visit: Payer: Self-pay | Admitting: Cardiovascular Disease

## 2019-03-03 ENCOUNTER — Other Ambulatory Visit: Payer: Self-pay | Admitting: Internal Medicine

## 2019-03-03 DIAGNOSIS — F3289 Other specified depressive episodes: Secondary | ICD-10-CM

## 2019-03-19 ENCOUNTER — Other Ambulatory Visit: Payer: Self-pay | Admitting: Cardiovascular Disease

## 2019-03-20 ENCOUNTER — Other Ambulatory Visit: Payer: Self-pay

## 2019-03-20 ENCOUNTER — Ambulatory Visit (INDEPENDENT_AMBULATORY_CARE_PROVIDER_SITE_OTHER): Payer: Medicare HMO | Admitting: Internal Medicine

## 2019-03-20 ENCOUNTER — Encounter: Payer: Self-pay | Admitting: Internal Medicine

## 2019-03-20 VITALS — BP 118/66 | HR 64 | Temp 98.6°F | Ht 64.0 in | Wt 164.4 lb

## 2019-03-20 DIAGNOSIS — E039 Hypothyroidism, unspecified: Secondary | ICD-10-CM | POA: Diagnosis not present

## 2019-03-20 DIAGNOSIS — Z23 Encounter for immunization: Secondary | ICD-10-CM | POA: Diagnosis not present

## 2019-03-20 DIAGNOSIS — N1831 Chronic kidney disease, stage 3a: Secondary | ICD-10-CM

## 2019-03-20 DIAGNOSIS — I4819 Other persistent atrial fibrillation: Secondary | ICD-10-CM

## 2019-03-20 DIAGNOSIS — I10 Essential (primary) hypertension: Secondary | ICD-10-CM

## 2019-03-20 DIAGNOSIS — E7800 Pure hypercholesterolemia, unspecified: Secondary | ICD-10-CM | POA: Diagnosis not present

## 2019-03-20 DIAGNOSIS — I480 Paroxysmal atrial fibrillation: Secondary | ICD-10-CM

## 2019-03-20 LAB — COMPLETE METABOLIC PANEL WITH GFR
AG Ratio: 1.5 (calc) (ref 1.0–2.5)
ALT: 11 U/L (ref 6–29)
AST: 19 U/L (ref 10–35)
Albumin: 4 g/dL (ref 3.6–5.1)
Alkaline phosphatase (APISO): 56 U/L (ref 37–153)
BUN: 17 mg/dL (ref 7–25)
CO2: 29 mmol/L (ref 20–32)
Calcium: 9.3 mg/dL (ref 8.6–10.4)
Chloride: 101 mmol/L (ref 98–110)
Creat: 0.91 mg/dL (ref 0.60–0.93)
GFR, Est African American: 70 mL/min/{1.73_m2} (ref >=60)
GFR, Est Non African American: 60 mL/min/{1.73_m2} (ref >=60)
Globulin: 2.7 g/dL (calc) (ref 1.9–3.7)
Glucose, Bld: 93 mg/dL (ref 65–99)
Potassium: 4.3 mmol/L (ref 3.5–5.3)
Sodium: 139 mmol/L (ref 135–146)
Total Bilirubin: 0.6 mg/dL (ref 0.2–1.2)
Total Protein: 6.7 g/dL (ref 6.1–8.1)

## 2019-03-20 LAB — CBC WITH DIFFERENTIAL/PLATELET
Absolute Monocytes: 803 cells/uL (ref 200–950)
Basophils Absolute: 94 cells/uL (ref 0–200)
Basophils Relative: 1.2 %
Eosinophils Absolute: 234 cells/uL (ref 15–500)
Eosinophils Relative: 3 %
HCT: 38.3 % (ref 35.0–45.0)
Hemoglobin: 12.2 g/dL (ref 11.7–15.5)
Lymphs Abs: 2371 cells/uL (ref 850–3900)
MCH: 26.3 pg — ABNORMAL LOW (ref 27.0–33.0)
MCHC: 31.9 g/dL — ABNORMAL LOW (ref 32.0–36.0)
MCV: 82.7 fL (ref 80.0–100.0)
MPV: 10.4 fL (ref 7.5–12.5)
Monocytes Relative: 10.3 %
Neutro Abs: 4298 cells/uL (ref 1500–7800)
Neutrophils Relative %: 55.1 %
Platelets: 218 10*3/uL (ref 140–400)
RBC: 4.63 10*6/uL (ref 3.80–5.10)
RDW: 14 % (ref 11.0–15.0)
Total Lymphocyte: 30.4 %
WBC: 7.8 10*3/uL (ref 3.8–10.8)

## 2019-03-20 LAB — LIPID PANEL
Cholesterol: 138 mg/dL (ref <200)
HDL: 43 mg/dL — ABNORMAL LOW (ref >=50)
LDL Cholesterol (Calc): 73 mg/dL (calc)
Non-HDL Cholesterol (Calc): 95 mg/dL (calc) (ref <130)
Total CHOL/HDL Ratio: 3.2 (calc) (ref <5.0)
Triglycerides: 135 mg/dL (ref <150)

## 2019-03-20 LAB — TSH: TSH: 1.85 mIU/L (ref 0.40–4.50)

## 2019-03-20 NOTE — Patient Instructions (Addendum)
Please bring [place] a copy of your living will next time.    Please check our blood pressure if you feel lightheaded or dizzy.  Let's try to keep your blood pressure between 120-140 on the top number.  Let me know if you find it to be lower than that.

## 2019-03-20 NOTE — Progress Notes (Signed)
Location:  [facility] clinic Provider:  Jannat Rosemeyer L. Mariea Clonts, D.O., C.M.D.  Code Status: DNR Goals of Care:  Advanced Directives 03/20/2019  Does Patient Have a Medical Advance Directive? Yes  Type of Advance Directive Living will  Does patient want to make changes to medical advance directive? No - Patient declined  Copy of [place] in Chart? -  Would patient like information on creating a medical advance directive? -   Chief Complaint  Patient presents with  . Medical Management of Chronic Issues    Last seen 04/2018, no concerns  . Immunizations    Flu shot given today    HPI: Patient is a 78 y.o. female seen today for medical management of chronic diseases.    She is still working 5 days a week at the [employer].   She enjoys being outside.    Has a headache occasionally.  Mild headache--thinks maybe sinus.  Sometimes weather-related.  They ease off through the day.    No chest pain or palpitations.  Not in afib today on EKG.    No falls lately.    BP at goal.  Occasionally lightheadedness.  Sometimes when she first wakes up, but can be different times.    Her back hurts occasionally, but not daily.  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    Past Medical History:  Diagnosis Date  . Anxiety   . Atrial fibrillation with rapid ventricular response ([place]) 07/07/2015  . Bilateral breast cancer ([place])   . Daily headache   . Depression   . GERD (gastroesophageal reflux disease)   . Hyperlipidemia   . Hypertension   . Hypothyroidism   . Iron deficiency anemia 2000s  . Lumbago   . Nausea alone   . Senile osteoporosis   . Squamous cell carcinoma of forehead    "I think they burned it off"    Past Surgical History:  Procedure Laterality Date  . ABDOMINAL HYSTERECTOMY  1970s  . BREAST BIOPSY Bilateral   . BREAST LUMPECTOMY Right 2002   "w/lymph nodes removed"  . CARDIOVERSION N/A 08/19/2015   Procedure: CARDIOVERSION;  Surgeon: Sanda Klein, MD;  Location: [place];  Service: Cardiovascular;  Laterality: N/A;  . CATARACT EXTRACTION W/ INTRAOCULAR LENS  IMPLANT, BILATERAL Bilateral 2000s  . DILATION AND CURETTAGE OF UTERUS  ~ 1968  . ELBOW FRACTURE SURGERY Left 2011   "has 2 screws in it"  . EXCISIONAL HEMORRHOIDECTOMY  ~ 1970  . FRACTURE SURGERY  02/12/2016   Right Wrist  . HEMIARTHROPLASTY HIP Left 2011   "had ball replaced"  . INGUINAL HERNIA REPAIR Right 1980s?  [name] MASTECTOMY Left 2005  . ORIF WRIST FRACTURE Left 02/12/2016   Procedure: OPEN REDUCTION INTERNAL FIXATION LEFT DISTAL RADIUS;  Surgeon: Dayna Barker, MD;  Location: [place];  Service: Plastics;  Laterality: Left;  . TOTAL THYROIDECTOMY  1972  . WRIST FRACTURE SURGERY Right    "has a plate and 7 screws in there"    No Known Allergies  Outpatient Encounter Medications as of 03/20/2019  Medication Sig  . acetaminophen (TYLENOL) 325 MG tablet Take 2 tablets (650 mg total) by mouth every 4 (four) hours as needed for headache or mild pain.  [name] alendronate (FOSAMAX) 70 MG tablet Take 1 tablet (70 mg total) by mouth every 7 (seven) days. Take with a full glass of water on an empty stomach.  . ALPRAZolam (XANAX) 0.5 MG tablet  Take 0.5 mg by mouth daily as needed for anxiety.  [name] amLODipine (NORVASC) 5 MG tablet Take 1 tablet (5 mg total) by mouth daily. *NEEDS OFFICE VISIT FOR FURTHER REFILLS*  . apixaban (ELIQUIS) 5 MG TABS tablet Take 1 tablet (5 mg total) by mouth 2 (two) times daily. OFFICE VISIT NEEDED  . Cholecalciferol (VITAMIN D3) 2000 UNITS TABS Take 1 tablet by mouth daily.   [name] ibuprofen (ADVIL) 200 MG tablet Take 200 mg by mouth daily as needed for headache or mild pain.  [name] levothyroxine (SYNTHROID) 150 MCG tablet TAKE 1 TABLET BY MOUTH ONCE DAILY 30 MINUTES BEFORE BREAKFAST  . metoprolol tartrate (LOPRESSOR) 50 MG tablet Take 1 tablet (50 mg total) by mouth 2 (two) times daily. OFFICE VISIT NEEDED  .  rosuvastatin (CRESTOR) 10 MG tablet TAKE 1 TABLET BY MOUTH DAILY  . sertraline (ZOLOFT) 25 MG tablet TAKE 2 TABLETS BY MOUTH DAILY  . triamterene-hydrochlorothiazide (MAXZIDE-25) 37.5-25 MG tablet Take 0.5 tablets by mouth daily.  . [DISCONTINUED] ALPRAZolam (XANAX) 0.5 MG tablet TAKE 1 TABLET BY MOUTH DAILY FOR ANXIETY  . [DISCONTINUED] ranitidine (ZANTAC) 75 MG tablet Take 75 mg by mouth 2 (two) times daily as needed (For Heartburn).    No facility-administered encounter medications on file as of 03/20/2019.     Review of Systems:  Review of Systems  Constitutional: Positive for malaise/fatigue. Negative for chills and fever.  HENT: Negative for hearing loss.   Eyes: Negative for blurred vision.  Respiratory: Negative for cough and shortness of breath.   Cardiovascular: Negative for chest pain, palpitations and leg swelling.  Gastrointestinal: Positive for constipation. Negative for abdominal pain, blood in stool, diarrhea and melena.  Genitourinary: Negative for dysuria.  Musculoskeletal: Positive for back pain. Negative for falls and joint pain.  Skin: Negative for itching and rash.  Neurological: Negative for dizziness and loss of consciousness.  Endo/Heme/Allergies: Does not bruise/bleed easily.  Psychiatric/Behavioral: Positive for depression. Negative for memory loss. The patient is nervous/anxious. The patient does not have insomnia.     Health Maintenance  Topic Date Due  . TETANUS/TDAP  10/23/2027  . INFLUENZA VACCINE  Completed  . DEXA SCAN  Completed  . PNA vac Low Risk Adult  Completed    Physical Exam: Vitals:   03/20/19 0927  BP: 118/66  Pulse: 64  Temp: 98.6 F (37 C)  TempSrc: Oral  SpO2: 96%  Weight: 164 lb 6.4 oz (74.6 kg)  Height: 5\' 4"  (1.626 m)   Body mass index is 28.22 kg/m. Physical Exam Vitals signs reviewed.  Constitutional:      General: She is not in acute distress.    Appearance: Normal appearance. She is not toxic-appearing.  HENT:      Head: Normocephalic and atraumatic.  Eyes:     Extraocular Movements: Extraocular movements intact.     Pupils: Pupils are equal, round, and reactive to light.  Cardiovascular:     Rate and Rhythm: Regular rhythm. Bradycardia present.     Heart sounds: Normal heart sounds.  Pulmonary:     Effort: Pulmonary effort is normal.     Breath sounds: Normal breath sounds. No wheezing, rhonchi or rales.  Abdominal:     General: Bowel sounds are normal.  Musculoskeletal: Normal range of motion.     Right lower leg: No edema.     Left lower leg: No edema.     Comments: nonpitting  Skin:    General: Skin is warm and dry.  Neurological:  General: No focal deficit present.     Mental Status: She is alert and oriented to person, place, and time.  Psychiatric:        Mood and Affect: Mood normal.        Behavior: Behavior normal.     Labs reviewed: Basic Metabolic Panel: Recent Labs    04/04/18 0855  NA 140  K 4.8  CL 102  CO2 30  GLUCOSE 90  BUN 13  CREATININE 1.06*  CALCIUM 9.7  TSH 0.95   Liver Function Tests: Recent Labs    04/04/18 0855  AST 15  ALT 10  BILITOT 0.6  PROT 7.0   No results for input(s): LIPASE, AMYLASE in the last 8760 hours. No results for input(s): AMMONIA in the last 8760 hours. CBC: Recent Labs    04/04/18 0855  WBC 10.6  NEUTROABS 6,901  HGB 12.4  HCT 40.0  MCV 79.5*  PLT 234   Lipid Panel: Recent Labs    04/04/18 0855  CHOL 216*  HDL 47*  LDLCALC 139*  TRIG 167*  CHOLHDL 4.6   Lab Results  Component Value Date   HGBA1C 5.8 (H) 11/22/2015    Procedures since last visit: No results found.  Assessment/Plan 1. Paroxysmal atrial fibrillation (HCC) - today in sinus brady  - EKG 12-Lead  2. Essential hypertension, benign - bp well controlled, but may be low - she will check her bp at home and let me know if she has lows  3. Hypothyroidism, unspecified type - cont current levothyroxine - TSH  4.  Chronic renal  impairment, stage 3a - cont to avoid regular nsaid use, hydrate well - CBC with Differential/Platelet - COMPLETE METABOLIC PANEL WITH GFR  6. Hypercholesterolemia - f/u lab:  She is fasting - Lipid panel  7. Need for influenza vaccination - Flu Vaccine QUAD High Dose(Fluad)  Labs/tests ordered:  Lab Orders     CBC with Differential/Platelet     COMPLETE METABOLIC PANEL WITH GFR     Lipid panel     TSH  Next appt:  6 mos med mgt  Yoltzin Barg L. Ryaan Vanwagoner, D.O. [facility] [address], [place] [address] Cell Phone (Mon-Fri 8am-5pm):  [phone] On Call:  [phone] & follow prompts after 5pm & weekends Office Phone:  [phone] Office Fax:  [phone]

## 2019-04-03 ENCOUNTER — Other Ambulatory Visit: Payer: Self-pay | Admitting: Internal Medicine

## 2019-04-03 DIAGNOSIS — I10 Essential (primary) hypertension: Secondary | ICD-10-CM

## 2019-04-21 ENCOUNTER — Other Ambulatory Visit: Payer: Self-pay | Admitting: Cardiovascular Disease

## 2019-05-06 ENCOUNTER — Other Ambulatory Visit: Payer: Self-pay | Admitting: Cardiovascular Disease

## 2019-05-06 DIAGNOSIS — R05 Cough: Secondary | ICD-10-CM | POA: Diagnosis not present

## 2019-05-06 DIAGNOSIS — Z20828 Contact with and (suspected) exposure to other viral communicable diseases: Secondary | ICD-10-CM | POA: Diagnosis not present

## 2019-05-06 NOTE — Telephone Encounter (Signed)
Refill Request.  

## 2019-05-14 ENCOUNTER — Ambulatory Visit: Payer: Medicare HMO | Admitting: Cardiovascular Disease

## 2019-05-26 ENCOUNTER — Ambulatory Visit (INDEPENDENT_AMBULATORY_CARE_PROVIDER_SITE_OTHER): Payer: Medicare HMO | Admitting: Family

## 2019-05-26 ENCOUNTER — Other Ambulatory Visit: Payer: Self-pay

## 2019-05-26 ENCOUNTER — Encounter: Payer: Self-pay | Admitting: Family

## 2019-05-26 VITALS — BP 158/86 | HR 55

## 2019-05-26 DIAGNOSIS — Z0000 Encounter for general adult medical examination without abnormal findings: Secondary | ICD-10-CM

## 2019-05-26 NOTE — Progress Notes (Signed)
This service is provided via telemedicine  No vital signs collected/recorded due to the encounter was a telemedicine visit.   Location of patient (ex: home, work):  Home   Patient consents to a telephone visit: Yes  Location of the provider (ex: office, home):  Office   Name of any referring provider:  Hollace Kinnier, D. O   Names of all persons participating in the telemedicine service and their role in the encounter:  Marlowe Sax, NP, Ruthell Rummage CMA and patient  Time spent on call:  Ruthell Rummage CMA spent 9 minutes on phone with patient

## 2019-05-26 NOTE — Progress Notes (Signed)
Subjective:   Deborah Randall is a 79 y.o. female who presents for Medicare Annual (Subsequent) preventive examination.  Review of Systems:  Cardiac Risk Factors include: advanced age (>12men, >69 women);hypertension;sedentary lifestyle;dyslipidemia     Objective:     Vitals: BP (!) 158/86   Pulse (!) 55   There is no height or weight on file to calculate BMI.  Advanced Directives 05/26/2019 03/20/2019 05/20/2018 04/04/2018 10/22/2017 05/16/2017 02/08/2017  Does Patient Have a Medical Advance Directive? Yes Yes Yes Yes No Yes No  Type of Advance Directive Living will Living will Living will Living will - [place];Living will -  Does patient want to make changes to medical advance directive? No - Patient declined No - Patient declined No - Patient declined No - Patient declined - No - Patient declined -  Copy of [place] in Chart? - - - - - No - copy requested -  Would patient like information on creating a medical advance directive? - - - - No - Patient declined - No - Patient declined    Tobacco Social History   Tobacco Use  Smoking Status Never Smoker  Smokeless Tobacco Never Used     Counseling given: Not Answered   Clinical Intake:  Pre-visit preparation completed: No  Pain : No/denies pain     BMI - recorded: 28.22 Nutritional Status: BMI 25 -29 Overweight Nutritional Risks: None Diabetes: No  How often do you need to have someone help you when you read instructions, pamphlets, or other written materials from your doctor or pharmacy?: 1 - Never What is the last grade level you completed in school?: 12 Grade  Interpreter Needed?: No  Information entered by :: Lavaun Greenfield FNP-C  Past Medical History:  Diagnosis Date  . Anxiety   . Atrial fibrillation with rapid ventricular response ([place]) 07/07/2015  . Bilateral breast cancer ([place])   . Daily headache   . Depression   . GERD (gastroesophageal reflux disease)   .  Hyperlipidemia   . Hypertension   . Hypothyroidism   . Iron deficiency anemia 2000s  . Lumbago   . Nausea alone   . Senile osteoporosis   . Squamous cell carcinoma of forehead    "I think they burned it off"   Past Surgical History:  Procedure Laterality Date  . ABDOMINAL HYSTERECTOMY  1970s  . BREAST BIOPSY Bilateral   . BREAST LUMPECTOMY Right 2002   "w/lymph nodes removed"  . CARDIOVERSION N/A 08/19/2015   Procedure: CARDIOVERSION;  Surgeon: Sanda Klein, MD;  Location: [place];  Service: Cardiovascular;  Laterality: N/A;  . CATARACT EXTRACTION W/ INTRAOCULAR LENS  IMPLANT, BILATERAL Bilateral 2000s  . DILATION AND CURETTAGE OF UTERUS  ~ 1968  . ELBOW FRACTURE SURGERY Left 2011   "has 2 screws in it"  . EXCISIONAL HEMORRHOIDECTOMY  ~ 1970  . FRACTURE SURGERY  02/12/2016   Right Wrist  . HEMIARTHROPLASTY HIP Left 2011   "had ball replaced"  . INGUINAL HERNIA REPAIR Right 1980s?  [name] MASTECTOMY Left 2005  . ORIF WRIST FRACTURE Left 02/12/2016   Procedure: OPEN REDUCTION INTERNAL FIXATION LEFT DISTAL RADIUS;  Surgeon: Dayna Barker, MD;  Location: [place];  Service: Plastics;  Laterality: Left;  . TOTAL THYROIDECTOMY  1972  . WRIST FRACTURE SURGERY Right    "has a plate and 7 screws in there"   Family History  Problem Relation Age of Onset  . Cancer Father   . Heart attack Brother   .  Mental illness Son   . Other Brother   . Other Brother   . Cancer Sister   . Heart disease Sister   . Hypertension Sister   . Other Brother   . Diabetes Brother   . COPD Brother   . Mental illness Son    Social History   Socioeconomic History  . Marital status: Widowed    Spouse name: Not on file  . Number of children: Not on file  . Years of education: Not on file  . Highest education level: Not on file  Occupational History  . 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 Smoking status: Never Smoker  . Smokeless tobacco: Never Used  Substance and Sexual  Activity  . Alcohol use: No  . Drug use: No  . Sexual activity: Never  Other Topics Concern  . Not on file  Social History Narrative   Lives alone   Widow   2 sons , one son decease, one son disable    Works at [employer] in [employer]   Enjoys going places with friends   Never smoked   Alcohol none   Exercise -walk, works in yard    Social Determinants of Radio broadcast assistant Strain:   . Difficulty of Paying Living Expenses: Not on file  Food Insecurity:   . Worried About Charity fundraiser in the Last Year: Not on file  . Ran Out of Food in the Last Year: Not on file  Transportation Needs:   . Lack of Transportation (Medical): Not on file  . Lack of Transportation (Non-Medical): Not on file  Physical Activity:   . Days of Exercise per Week: Not on file  . Minutes of Exercise per Session: Not on file  Stress:   . Feeling of Stress : Not on file  Social Connections:   . Frequency of Communication with Friends and Family: Not on file  . Frequency of Social Gatherings with Friends and Family: Not on file  . Attends Religious Services: Not on file  . Active Member of Clubs or Organizations: Not on file  . Attends Archivist Meetings: Not on file  . Marital Status: Not on file    Outpatient Encounter Medications as of 05/26/2019  Medication Sig  . acetaminophen (TYLENOL) 325 MG tablet Take 2 tablets (650 mg total) by mouth every 4 (four) hours as needed for headache or mild pain.  [name] alendronate (FOSAMAX) 70 MG tablet Take 1 tablet (70 mg total) by mouth every 7 (seven) days. Take with a full glass of water on an empty stomach.  . ALPRAZolam (XANAX) 0.5 MG tablet Take 0.5 mg by mouth daily as needed for anxiety.  [name] amLODipine (NORVASC) 5 MG tablet TAKE 1 TABLET BY MOUTH DAILY  . Cholecalciferol (VITAMIN D3) 2000 UNITS TABS Take 1 tablet by mouth daily.   [name] ELIQUIS 5 MG TABS tablet TAKE 1 TABLET BY MOUTH TWICE DAILY  . ibuprofen (ADVIL) 200 MG tablet  Take 200 mg by mouth daily as needed for headache or mild pain.  [name] levothyroxine (SYNTHROID) 150 MCG tablet TAKE 1 TABLET BY MOUTH ONCE DAILY 30 MINUTES BEFORE BREAKFAST  . metoprolol tartrate (LOPRESSOR) 50 MG tablet TAKE 1 TABLET BY MOUTH TWICE DAILY  . rosuvastatin (CRESTOR) 10 MG tablet TAKE 1 TABLET BY MOUTH DAILY  . sertraline (ZOLOFT) 25 MG tablet TAKE 2 TABLETS BY MOUTH DAILY  . triamterene-hydrochlorothiazide (MAXZIDE-25) 37.5-25 MG tablet TAKE 1/2  TABLET BY MOUTH DAILY   No facility-administered encounter medications on file as of 05/26/2019.    Activities of Daily Living In your present state of health, do you have any difficulty performing the following activities: 05/26/2019  Hearing? N  Vision? N  Difficulty concentrating or making decisions? N  Walking or climbing stairs? N  Dressing or bathing? N  Doing errands, shopping? N  Preparing Food and eating ? N  Using the Toilet? N  In the past six months, have you accidently leaked urine? N  Do you have problems with loss of bowel control? N  Managing your Medications? N  Managing your Finances? N  Housekeeping or managing your Housekeeping? N  Some recent data might be hidden    Patient Care Team: Gayland Curry, DO as PCP - General (Geriatric Medicine) Sanda Klein, MD as Consulting Physician (Cardiology) Martinique, Amy, MD as Consulting Physician (Dermatology)    Assessment:   This is a routine wellness examination for [facility].  Exercise Activities and Dietary recommendations Current Exercise Habits: The patient does not participate in regular exercise at present  Goals    . Increase water intake     Starting 03/02/16, I will attempt to drink 3 bottles of water per day, instead of 1-2 bottles.    . Weight (lb) < 160 lb (72.6 kg)     Pt will walk more to lose weight       Fall Risk Fall Risk  05/26/2019 03/20/2019 05/20/2018 04/04/2018 10/22/2017  Falls in the past year? 0 0 0 1 No  Number falls in past yr: 0 - -  0 -  Injury with Fall? 0 - - 0 -  Comment - - - - -  Risk for fall due to : - - - - -  Follow up - - - - -  Comment - - - - -   Is the patient's home free of loose throw rugs in walkways, pet beds, electrical cords, etc?   no      Grab bars in the bathroom? no      Handrails on the stairs?   yes      Adequate lighting?   yes  Depression Screen PHQ 2/9 Scores 05/26/2019 03/20/2019 05/20/2018 10/22/2017  PHQ - 2 Score 0 0 0 0  Exception Documentation - - - -     Cognitive Function MMSE - Mini Mental State Exam 05/16/2017 03/02/2016 02/19/2015 09/25/2013  Orientation to time 5 5 5 5   Orientation to Place 5 5 5 5   Registration 3 3 3 3   Attention/ Calculation 5 5 5 5   Recall 1 3 3 2   Language- name 2 objects 2 2 2 2   Language- repeat 1 1 1 1   Language- follow 3 step command 3 3 3 3   Language- read & follow direction 1 1 1 1   Write a sentence 1 1 1 1   Copy design 1 - 1 1  Total score 28 - 30 29     6CIT Screen 05/26/2019  What Year? 0 points  What month? 0 points  What time? 0 points  Count back from 20 0 points  Months in reverse 0 points  Repeat phrase 0 points  Total Score 0    Immunization History  Administered Date(s) Administered  . Fluad Quad(high Dose 65+) 03/20/2019  . Influenza, High Dose Seasonal PF 02/08/2017, 04/04/2018  . Influenza,inj,Quad PF,6+ Mos 03/14/2013, 03/26/2014, 02/19/2015, 03/02/2016  . Pneumococcal Conjugate-13 08/02/2013, 08/03/2014  . Pneumococcal Polysaccharide-23  05/22/2009  . Td 10/22/2017  . Zoster 09/25/2013  . Zoster Recombinat (Shingrix) 05/20/2018, 11/06/2018    Qualifies for Shingles Vaccine? Up to date   Screening Tests Health Maintenance  Topic Date Due  . TETANUS/TDAP  10/23/2027  . INFLUENZA VACCINE  Completed  . DEXA SCAN  Completed  . PNA vac Low Risk Adult  Completed    Cancer Screenings: Lung: Low Dose CT Chest recommended if Age 48-80 years, 30 pack-year currently smoking OR have quit w/in 15years. Patient does not  qualify. Breast:  Up to date on Mammogram? No   Up to date of Bone Density/Dexa? Yes Colorectal:N/A   Additional Screenings: Hepatitis C Screening: low risk   Plan:   I have personally reviewed and noted the following in the patient's chart:   . Medical and social history . Use of alcohol, tobacco or illicit drugs  . Current medications and supplements . Functional ability and status . Nutritional status . Physical activity . Advanced directives . List of other physicians . Hospitalizations, surgeries, and ER visits in previous 12 months . Vitals . Screenings to include cognitive, depression, and falls . Referrals and appointments  In addition, I have reviewed and discussed with patient certain preventive protocols, quality metrics, and best practice recommendations. A written personalized care plan for preventive services as well as general preventive health recommendations were provided to patient.  Sandrea Hughs, NP  05/26/2019

## 2019-05-26 NOTE — Patient Instructions (Signed)
Deborah Randall , Thank you for taking time to come for your Medicare Wellness Visit. I appreciate your ongoing commitment to your health goals. Please review the following plan we discussed and let me know if I can assist you in the future.   Screening recommendations/referrals: Colonoscopy:  Mammogram: Up to date  Bone Density : Up to date  Recommended yearly ophthalmology/optometry visit for glaucoma screening and checkup Recommended yearly dental visit for hygiene and checkup  Vaccinations: Influenza vaccine : Up to date  Pneumococcal vaccine : Up to date  Tdap vaccine : Up to date due 10/23/2027 Shingles vaccine : Up to date   Advanced directives: Yes   Conditions/risks identified: Advance age female > 31 yrs,Hypertension,sedentary lifestyle,dyslipidemia   Next appointment: 1 year    Preventive Care 33 Years and Older, Female Preventive care refers to lifestyle choices and visits with your health care provider that can promote health and wellness. What does preventive care include?  A yearly physical exam. This is also called an annual well check.  Dental exams once or twice a year.  Routine eye exams. Ask your health care provider how often you should have your eyes checked.  Personal lifestyle choices, including:  Daily care of your teeth and gums.  Regular physical activity.  Eating a healthy diet.  Avoiding tobacco and drug use.  Limiting alcohol use.  Practicing safe sex.  Taking low-dose aspirin every day.  Taking vitamin and mineral supplements as recommended by your health care provider. What happens during an annual well check? The services and screenings done by your health care provider during your annual well check will depend on your age, overall health, lifestyle risk factors, and family history of disease. Counseling  Your health care provider may ask you questions about your:  Alcohol use.  Tobacco use.  Drug use.  Emotional well-being.  Home  and relationship well-being.  Sexual activity.  Eating habits.  History of falls.  Memory and ability to understand (cognition).  Work and work Statistician.  Reproductive health. Screening  You may have the following tests or measurements:  Height, weight, and BMI.  Blood pressure.  Lipid and cholesterol levels. These may be checked every 5 years, or more frequently if you are over 38 years old.  Skin check.  Lung cancer screening. You may have this screening every year starting at age 70 if you have a 30-pack-year history of smoking and currently smoke or have quit within the past 15 years.  Fecal occult blood test (FOBT) of the stool. You may have this test every year starting at age 16.  Flexible sigmoidoscopy or colonoscopy. You may have a sigmoidoscopy every 5 years or a colonoscopy every 10 years starting at age 28.  Hepatitis C blood test.  Hepatitis B blood test.  Sexually transmitted disease (STD) testing.  Diabetes screening. This is done by checking your blood sugar (glucose) after you have not eaten for a while (fasting). You may have this done every 1-3 years.  Bone density scan. This is done to screen for osteoporosis. You may have this done starting at age 67.  Mammogram. This may be done every 1-2 years. Talk to your health care provider about how often you should have regular mammograms. Talk with your health care provider about your test results, treatment options, and if necessary, the need for more tests. Vaccines  Your health care provider may recommend certain vaccines, such as:  Influenza vaccine. This is recommended every year.  Tetanus, diphtheria,  and acellular pertussis (Tdap, Td) vaccine. You may need a Td booster every 10 years.  Zoster vaccine. You may need this after age 22.  Pneumococcal 13-valent conjugate (PCV13) vaccine. One dose is recommended after age 1.  Pneumococcal polysaccharide (PPSV23) vaccine. One dose is recommended  after age 77. Talk to your health care provider about which screenings and vaccines you need and how often you need them. This information is not intended to replace advice given to you by your health care provider. Make sure you discuss any questions you have with your health care provider. Document Released: 06/04/2015 Document Revised: 01/26/2016 Document Reviewed: 03/09/2015 Elsevier Interactive Patient Education  2017 [place] Prevention in the Home Falls can cause injuries. They can happen to people of all ages. There are many things you can do to make your home safe and to help prevent falls. What can I do on the outside of my home?  Regularly fix the edges of walkways and driveways and fix any cracks.  Remove anything that might make you trip as you walk through a door, such as a raised step or threshold.  Trim any bushes or trees on the path to your home.  Use bright outdoor lighting.  Clear any walking paths of anything that might make someone trip, such as rocks or tools.  Regularly check to see if handrails are loose or broken. Make sure that both sides of any steps have handrails.  Any raised decks and porches should have guardrails on the edges.  Have any leaves, snow, or ice cleared regularly.  Use sand or salt on walking paths during winter.  Clean up any spills in your garage right away. This includes oil or grease spills. What can I do in the bathroom?  Use night lights.  Install grab bars by the toilet and in the tub and shower. Do not use towel bars as grab bars.  Use non-skid mats or decals in the tub or shower.  If you need to sit down in the shower, use a plastic, non-slip stool.  Keep the floor dry. Clean up any water that spills on the floor as soon as it happens.  Remove soap buildup in the tub or shower regularly.  Attach bath mats securely with double-sided non-slip rug tape.  Do not have throw rugs and other things on the floor  that can make you trip. What can I do in the bedroom?  Use night lights.  Make sure that you have a light by your bed that is easy to reach.  Do not use any sheets or blankets that are too big for your bed. They should not hang down onto the floor.  Have a firm chair that has side arms. You can use this for support while you get dressed.  Do not have throw rugs and other things on the floor that can make you trip. What can I do in the kitchen?  Clean up any spills right away.  Avoid walking on wet floors.  Keep items that you use a lot in easy-to-reach places.  If you need to reach something above you, use a strong step stool that has a grab bar.  Keep electrical cords out of the way.  Do not use floor polish or wax that makes floors slippery. If you must use wax, use non-skid floor wax.  Do not have throw rugs and other things on the floor that can make you trip. What can I do with my  stairs?  Do not leave any items on the stairs.  Make sure that there are handrails on both sides of the stairs and use them. Fix handrails that are broken or loose. Make sure that handrails are as long as the stairways.  Check any carpeting to make sure that it is firmly attached to the stairs. Fix any carpet that is loose or worn.  Avoid having throw rugs at the top or bottom of the stairs. If you do have throw rugs, attach them to the floor with carpet tape.  Make sure that you have a light switch at the top of the stairs and the bottom of the stairs. If you do not have them, ask someone to add them for you. What else can I do to help prevent falls?  Wear shoes that:  Do not have high heels.  Have rubber bottoms.  Are comfortable and fit you well.  Are closed at the toe. Do not wear sandals.  If you use a stepladder:  Make sure that it is fully opened. Do not climb a closed stepladder.  Make sure that both sides of the stepladder are locked into place.  Ask someone to hold it  for you, if possible.  Clearly mark and make sure that you can see:  Any grab bars or handrails.  First and last steps.  Where the edge of each step is.  Use tools that help you move around (mobility aids) if they are needed. These include:  Canes.  Walkers.  Scooters.  Crutches.  Turn on the lights when you go into a dark area. Replace any light bulbs as soon as they burn out.  Set up your furniture so you have a clear path. Avoid moving your furniture around.  If any of your floors are uneven, fix them.  If there are any pets around you, be aware of where they are.  Review your medicines with your doctor. Some medicines can make you feel dizzy. This can increase your chance of falling. Ask your doctor what other things that you can do to help prevent falls. This information is not intended to replace advice given to you by your health care provider. Make sure you discuss any questions you have with your health care provider. Document Released: 03/04/2009 Document Revised: 10/14/2015 Document Reviewed: 06/12/2014 Elsevier Interactive Patient Education  2017 [employer].

## 2019-05-27 ENCOUNTER — Other Ambulatory Visit: Payer: Self-pay | Admitting: Cardiovascular Disease

## 2019-06-03 DIAGNOSIS — R05 Cough: Secondary | ICD-10-CM | POA: Diagnosis not present

## 2019-06-03 DIAGNOSIS — R591 Generalized enlarged lymph nodes: Secondary | ICD-10-CM | POA: Diagnosis not present

## 2019-06-03 DIAGNOSIS — Z20828 Contact with and (suspected) exposure to other viral communicable diseases: Secondary | ICD-10-CM | POA: Diagnosis not present

## 2019-06-05 ENCOUNTER — Other Ambulatory Visit: Payer: Self-pay | Admitting: Cardiovascular Disease

## 2019-06-30 ENCOUNTER — Other Ambulatory Visit: Payer: Self-pay | Admitting: Cardiovascular Disease

## 2019-07-11 ENCOUNTER — Other Ambulatory Visit: Payer: Self-pay | Admitting: Cardiovascular Disease

## 2019-07-11 NOTE — Telephone Encounter (Signed)
78 F, 74.6 kg, SCr 0.91, NOV [place] 3/21

## 2019-07-23 ENCOUNTER — Other Ambulatory Visit: Payer: Self-pay

## 2019-07-23 ENCOUNTER — Ambulatory Visit: Payer: Medicare HMO | Admitting: Cardiovascular Disease

## 2019-07-23 ENCOUNTER — Encounter: Payer: Self-pay | Admitting: Cardiovascular Disease

## 2019-07-23 VITALS — BP 106/62 | HR 85 | Ht 64.0 in | Wt 165.0 lb

## 2019-07-23 DIAGNOSIS — E7800 Pure hypercholesterolemia, unspecified: Secondary | ICD-10-CM | POA: Diagnosis not present

## 2019-07-23 DIAGNOSIS — I10 Essential (primary) hypertension: Secondary | ICD-10-CM

## 2019-07-23 DIAGNOSIS — I4819 Other persistent atrial fibrillation: Secondary | ICD-10-CM | POA: Diagnosis not present

## 2019-07-23 DIAGNOSIS — Z7901 Long term (current) use of anticoagulants: Secondary | ICD-10-CM

## 2019-07-23 NOTE — Patient Instructions (Signed)

## 2019-07-23 NOTE — Progress Notes (Signed)
Patient ID: Deborah Randall, female   DOB: [birth year], 79 y.o.   MRN: [id number]    Cardiology Office Note    Date:  07/23/2019   ID:  Sani, Diersen [birth year], MRN [id number]  PCP:  Gayland Curry, DO  Cardiologist:  Sanda Klein, MD   Chief Complaint  Patient presents with  . Atrial Fibrillation    History of Present Illness:  Deborah Randall is a 79 y.o. female with recurrent but infrequent paroxysmal atrial fibrillation with rapid ventricular response, initially diagnosed in 2011, s/p previous cardioversion (August 19, 2015).   She presents in atrial fibrillation today, but is completely oblivious to the arrhythmia.  She is well rate controlled with a heart rate in the 80s.  The patient specifically denies any chest pain at rest exertion, dyspnea at rest or with exertion, orthopnea, paroxysmal nocturnal dyspnea, syncope, palpitations, focal neurological deficits, intermittent claudication, lower extremity edema (other than occasional mild ankle swelling at the end of the day), unexplained weight gain, cough, hemoptysis or wheezing.  She continues to live independently and takes care of her housework.  She has to rest before she can finish cleaning the old house, but this has not really changed compared to a year ago.  Her son and her brother live in homes in the same vicinity.  She is having some dental problems and will probably have to have 3 roots extracted.  She asked about anticoagulation around that time.  She has well treated hypertension and hypothyroidism and hypercholesterolemia. She has a remote history of left mastectomy and radiation therapy 16 years ago. She has preserved left ventricular systolic function and a moderately dilated left atrium.   Past Medical History:  Diagnosis Date  . Anxiety   . Atrial fibrillation with rapid ventricular response ([place]) 07/07/2015  . Bilateral breast cancer ([place])   . Daily headache   . Depression   . GERD (gastroesophageal reflux  disease)   . Hyperlipidemia   . Hypertension   . Hypothyroidism   . Iron deficiency anemia 2000s  . Lumbago   . Nausea alone   . Senile osteoporosis   . Squamous cell carcinoma of forehead    "I think they burned it off"    Past Surgical History:  Procedure Laterality Date  . ABDOMINAL HYSTERECTOMY  1970s  . BREAST BIOPSY Bilateral   . BREAST LUMPECTOMY Right 2002   "w/lymph nodes removed"  . CARDIOVERSION N/A 08/19/2015   Procedure: CARDIOVERSION;  Surgeon: Sanda Klein, MD;  Location: [facility];  Service: Cardiovascular;  Laterality: N/A;  . CATARACT EXTRACTION W/ INTRAOCULAR LENS  IMPLANT, BILATERAL Bilateral 2000s  . DILATION AND CURETTAGE OF UTERUS  ~ 1968  . ELBOW FRACTURE SURGERY Left 2011   "has 2 screws in it"  . EXCISIONAL HEMORRHOIDECTOMY  ~ 1970  . FRACTURE SURGERY  02/12/2016   Right Wrist  . HEMIARTHROPLASTY HIP Left 2011   "had ball replaced"  . INGUINAL HERNIA REPAIR Right 1980s?  [name] MASTECTOMY Left 2005  . ORIF WRIST FRACTURE Left 02/12/2016   Procedure: OPEN REDUCTION INTERNAL FIXATION LEFT DISTAL RADIUS;  Surgeon: Dayna Barker, MD;  Location: [facility];  Service: Plastics;  Laterality: Left;  . TOTAL THYROIDECTOMY  1972  . WRIST FRACTURE SURGERY Right    "has a plate and 7 screws in there"    Current Medications: Outpatient Medications Prior to Visit  Medication Sig Dispense Refill  . acetaminophen (TYLENOL) 325 MG tablet Take 2 tablets (650 mg total) by  mouth every 4 (four) hours as needed for headache or mild pain.    [name] alendronate (FOSAMAX) 70 MG tablet Take 1 tablet (70 mg total) by mouth every 7 (seven) days. Take with a full glass of water on an empty stomach. 4 tablet 11  . ALPRAZolam (XANAX) 0.5 MG tablet Take 0.5 mg by mouth daily as needed for anxiety.    [name] amLODipine (NORVASC) 5 MG tablet Take 1 tablet (5 mg total) by mouth daily. 30 tablet 0  . Cholecalciferol (VITAMIN D3) 2000 UNITS TABS Take 1 tablet by mouth daily.     [name] ELIQUIS 5 MG TABS  tablet TAKE 1 TABLET BY MOUTH TWICE DAILY 180 tablet 0  . ibuprofen (ADVIL) 200 MG tablet Take 200 mg by mouth daily as needed for headache or mild pain.    [name] levothyroxine (SYNTHROID) 150 MCG tablet TAKE 1 TABLET BY MOUTH ONCE DAILY 30 MINUTES BEFORE BREAKFAST 90 tablet 3  . metoprolol tartrate (LOPRESSOR) 50 MG tablet Take 1 tablet (50 mg total) by mouth 2 (two) times daily. 60 tablet 2  . rosuvastatin (CRESTOR) 10 MG tablet TAKE 1 TABLET BY MOUTH DAILY 90 tablet 1  . sertraline (ZOLOFT) 25 MG tablet TAKE 2 TABLETS BY MOUTH DAILY 180 tablet 1  . triamterene-hydrochlorothiazide (MAXZIDE-25) 37.5-25 MG tablet TAKE 1/2 TABLET BY MOUTH DAILY 45 tablet 3   No facility-administered medications prior to visit.     Allergies:   Patient has no known allergies.   Social History   Socioeconomic History  . Marital status: Widowed    Spouse name: Not on file  . Number of children: Not on file  . Years of education: Not on file  . Highest education level: Not on file  Occupational History  . 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 Smoking status: Never Smoker  . Smokeless tobacco: Never Used  Substance and Sexual Activity  . Alcohol use: No  . Drug use: No  . Sexual activity: Never  Other Topics Concern  . Not on file  Social History Narrative   Lives alone   Widow   2 sons , one son decease, one son disable    Works at [employer] in [employer]   Enjoys going places with friends   Never smoked   Alcohol none   Exercise -walk, works in yard    Social Determinants of Radio broadcast assistant Strain:   . Difficulty of Paying Living Expenses: Not on file  Food Insecurity:   . Worried About Charity fundraiser in the Last Year: Not on file  . Ran Out of Food in the Last Year: Not on file  Transportation Needs:   . Lack of Transportation (Medical): Not on file  . Lack of Transportation (Non-Medical): Not on file  Physical Activity:   . Days of  Exercise per Week: Not on file  . Minutes of Exercise per Session: Not on file  Stress:   . Feeling of Stress : Not on file  Social Connections:   . Frequency of Communication with Friends and Family: Not on file  . Frequency of Social Gatherings with Friends and Family: Not on file  . Attends Religious Services: Not on file  . Active Member of Clubs or Organizations: Not on file  . Attends Archivist Meetings: Not on file  . Marital Status: Not on file     Family History:  The patient's family history includes COPD  in her brother; Cancer in her father and sister; Diabetes in her brother; Heart attack in her brother; Heart disease in her sister; Hypertension in her sister; Mental illness in her son and son; Other in her brother, brother, and brother.   ROS:   Please see the history of present illness.    ROS All other systems are reviewed and are negative.  PHYSICAL EXAM:   VS:  BP 106/62   Pulse 85   Ht 5\' 4"  (1.626 m)   Wt 165 lb (74.8 kg)   BMI 28.32 kg/m      General: Alert, oriented x3, no distress, mildly overweight Head: no evidence of trauma, PERRL, EOMI, no exophtalmos or lid lag, no myxedema, no xanthelasma; normal ears, nose and oropharynx Neck: normal jugular venous pulsations and no hepatojugular reflux; brisk carotid pulses without delay and no carotid bruits Chest: clear to auscultation, no signs of consolidation by percussion or palpation, normal fremitus, symmetrical and full respiratory excursions Cardiovascular: normal position and quality of the apical impulse, irregular rhythm, normal first and second heart sounds, no murmurs, rubs or gallops Abdomen: no tenderness or distention, no masses by palpation, no abnormal pulsatility or arterial bruits, normal bowel sounds, no hepatosplenomegaly Extremities: no clubbing, cyanosis or edema; 2+ radial, ulnar and brachial pulses bilaterally; 2+ right femoral, posterior tibial and dorsalis pedis pulses; 2+ left  femoral, posterior tibial and dorsalis pedis pulses; no subclavian or femoral bruits Neurological: grossly nonfocal Psych: Normal mood and affect    Wt Readings from Last 3 Encounters:  07/23/19 165 lb (74.8 kg)  03/20/19 164 lb 6.4 oz (74.6 kg)  05/20/18 164 lb (74.4 kg)      Studies/Labs Reviewed:   EKG:  EKG is ordered today.  It shows atrial fibrillation with controlled rate.  QTc 452 ms  Recent Labs: 03/20/2019: ALT 11; BUN 17; Creat 0.91; Hemoglobin 12.2; Platelets 218; Potassium 4.3; Sodium 139; TSH 1.85   Lipid Panel    Component Value Date/Time   CHOL 138 03/20/2019 1039   CHOL 155 11/22/2015 0858   TRIG 135 03/20/2019 1039   HDL 43 (L) 03/20/2019 1039   HDL 46 11/22/2015 0858   CHOLHDL 3.2 03/20/2019 1039   VLDL 17 07/08/2015 0535   LDLCALC 73 03/20/2019 1039      ASSESSMENT:    1. Persistent atrial fibrillation ([place])   2. Long term (current) use of anticoagulants   3. Essential hypertension, benign   4. Hypercholesterolemia      PLAN:  In order of problems listed above:  1. AFib: Patient asymptomatic, well rate controlled atrial fibrillation today.  Unclear whether we just caught her during a paroxysmal episode or whether this could be persistent.  Either way cardioversion/antiarrhythmics do not appear to be justified.. Compliant with anticoagulation therapy, which is recommended lifelong (CHADSVasc score 4: age 54, gender, HTN). 2. Anticoagulation: She denies falls, injuries or bleeding. 3. HTN: Blood pressure is a little low today, but she rarely complains of dizziness.  At home when she checked her blood pressure typically runs in the 120s.  If she becomes more symptomatic I would recommend reducing the dose of amlodipine. 4. HLP: All lipid parameters in desirable range.    Medication Adjustments/Labs and Tests Ordered: Current medicines are reviewed at length with the patient today.  Concerns regarding medicines are outlined above.  Medication  changes, Labs and Tests ordered today are listed in the Patient Instructions below. Patient Instructions  Medication Instructions:  No changes *If you need  a refill on your cardiac medications before your next appointment, please call your pharmacy*   Lab Work: None ordered If you have labs (blood work) drawn today and your tests are completely normal, you will receive your results only by: [name] MyChart Message (if you have MyChart) OR . A paper copy in the mail If you have any lab test that is abnormal or we need to change your treatment, we will call you to review the results.   Testing/Procedures: None ordered   Follow-Up: At [facility], you and your health needs are our priority.  As part of our continuing mission to provide you with exceptional heart care, we have created designated Provider Care Teams.  These Care Teams include your primary Cardiologist (physician) and Advanced Practice Providers (APPs -  Physician Assistants and Nurse Practitioners) who all work together to provide you with the care you need, when you need it.  We recommend signing up for the patient portal called "MyChart".  Sign up information is provided on this After Visit Summary.  MyChart is used to connect with patients for Virtual Visits (Telemedicine).  Patients are able to view lab/test results, encounter notes, upcoming appointments, etc.  Non-urgent messages can be sent to your provider as well.   To learn more about what you can do with MyChart, go to [email or web address].    Your next appointment:   12 month(s)  The format for your next appointment:   In Person  Provider:   You may see Sanda Klein, MD or one of the following Advanced Practice Providers on your designated Care Team:    Almyra Deforest, PA-C  Fabian Sharp, [place] or   Roby Lofts, PA-C      Signed, Sanda Klein, MD  07/23/2019 [address], [place],    [id number] Phone: [phone]; Fax: [phone]

## 2019-07-28 ENCOUNTER — Other Ambulatory Visit: Payer: Self-pay | Admitting: Internal Medicine

## 2019-07-28 ENCOUNTER — Other Ambulatory Visit: Payer: Self-pay | Admitting: Physician Assistant

## 2019-08-11 DIAGNOSIS — Z20828 Contact with and (suspected) exposure to other viral communicable diseases: Secondary | ICD-10-CM | POA: Diagnosis not present

## 2019-08-11 DIAGNOSIS — R05 Cough: Secondary | ICD-10-CM | POA: Diagnosis not present

## 2019-08-11 DIAGNOSIS — J209 Acute bronchitis, unspecified: Secondary | ICD-10-CM | POA: Diagnosis not present

## 2019-08-11 DIAGNOSIS — J069 Acute upper respiratory infection, unspecified: Secondary | ICD-10-CM | POA: Diagnosis not present

## 2019-09-04 ENCOUNTER — Other Ambulatory Visit: Payer: Self-pay | Admitting: Internal Medicine

## 2019-09-04 DIAGNOSIS — F3289 Other specified depressive episodes: Secondary | ICD-10-CM

## 2019-09-04 NOTE — Telephone Encounter (Signed)
Patient has a followup in the office on 09/15/19. Other than AWV has not been seen in the office since 03/20/19. Only gave a 65-month supply in the event that dosing needs to be changed at [facility].   Alert for taking sertraline and Aleve.

## 2019-09-15 ENCOUNTER — Ambulatory Visit (INDEPENDENT_AMBULATORY_CARE_PROVIDER_SITE_OTHER): Payer: Medicare HMO | Admitting: Internal Medicine

## 2019-09-15 ENCOUNTER — Encounter: Payer: Self-pay | Admitting: Internal Medicine

## 2019-09-15 ENCOUNTER — Other Ambulatory Visit: Payer: Self-pay

## 2019-09-15 VITALS — BP 118/62 | HR 41 | Temp 97.5°F | Ht 64.0 in | Wt 172.0 lb

## 2019-09-15 DIAGNOSIS — R739 Hyperglycemia, unspecified: Secondary | ICD-10-CM

## 2019-09-15 DIAGNOSIS — E039 Hypothyroidism, unspecified: Secondary | ICD-10-CM | POA: Diagnosis not present

## 2019-09-15 DIAGNOSIS — R0600 Dyspnea, unspecified: Secondary | ICD-10-CM | POA: Diagnosis not present

## 2019-09-15 DIAGNOSIS — I4811 Longstanding persistent atrial fibrillation: Secondary | ICD-10-CM

## 2019-09-15 DIAGNOSIS — E7800 Pure hypercholesterolemia, unspecified: Secondary | ICD-10-CM | POA: Diagnosis not present

## 2019-09-15 DIAGNOSIS — N1831 Chronic kidney disease, stage 3a: Secondary | ICD-10-CM

## 2019-09-15 DIAGNOSIS — I509 Heart failure, unspecified: Secondary | ICD-10-CM

## 2019-09-15 DIAGNOSIS — J209 Acute bronchitis, unspecified: Secondary | ICD-10-CM

## 2019-09-15 DIAGNOSIS — R0609 Other forms of dyspnea: Secondary | ICD-10-CM

## 2019-09-15 DIAGNOSIS — R001 Bradycardia, unspecified: Secondary | ICD-10-CM

## 2019-09-15 MED ORDER — FUROSEMIDE 40 MG PO TABS: 40.0000 mg | ORAL_TABLET | Freq: Every day | ORAL | 0 refills | Status: DC

## 2019-09-15 NOTE — Progress Notes (Signed)
Thanks, I will try to expedite her echo. I'm guessing her HR was not really 41? Alanda Slim, can you please follow up on echo scheduling)

## 2019-09-15 NOTE — Progress Notes (Signed)
Location:  [facility] clinic Provider:  Ericia Moxley L. Mariea Clonts, D.O., C.M.D.  Goals of Care:  Advanced Directives 09/15/2019  Does Patient Have a Medical Advance Directive? No  Type of Advance Directive -  Does patient want to make changes to medical advance directive? -  Copy of [place] in Chart? -  Would patient like information on creating a medical advance directive? No - Patient declined     Chief Complaint  Patient presents with  . Medical Management of Chronic Issues    6 month follow up    HPI: Patient is a 79 y.o. female seen today for medical management of chronic diseases.   She was not feeling well in March.  Went to urgent care to make sure she did not have the virus on 3/22.  She had a CXR--was treated for bronchitis b/c xray did not show pneumonia--got zpak and albuterol.  She is still using albuterol some--maybe once a day.  Not coughing much anymore.  Hiatal hernia showed up on her xray.    Legs used to swell and go down at night, but they're not going down now.  At least going on 3 days, maybe a week.  They are so swollen she can hardly get her pants pulled up.  She is dyspneic when walking or picks up something.  No chest pain.  Has not noticed any palpitations.  Weight is up 7 lbs since early March when she saw Dr. Sallyanne Kuster. HR running low at 41 upon arrival--irregular.(pulse ox reading not accurate) Knees are aching and kept her up last night--swollen even into thighs.   Does use ibuprofen some for pain.    Had both of her covid vaccines.    Past Medical History:  Diagnosis Date  . Anxiety   . Atrial fibrillation with rapid ventricular response ([place]) 07/07/2015  . Bilateral breast cancer ([place])   . Daily headache   . Depression   . GERD (gastroesophageal reflux disease)   . Hyperlipidemia   . Hypertension   . Hypothyroidism   . Iron deficiency anemia 2000s  . Lumbago   . Nausea alone   . Senile osteoporosis   . Squamous cell carcinoma of  forehead    "I think they burned it off"    Past Surgical History:  Procedure Laterality Date  . ABDOMINAL HYSTERECTOMY  1970s  . BREAST BIOPSY Bilateral   . BREAST LUMPECTOMY Right 2002   "w/lymph nodes removed"  . CARDIOVERSION N/A 08/19/2015   Procedure: CARDIOVERSION;  Surgeon: Sanda Klein, MD;  Location: [facility];  Service: Cardiovascular;  Laterality: N/A;  . CATARACT EXTRACTION W/ INTRAOCULAR LENS  IMPLANT, BILATERAL Bilateral 2000s  . DILATION AND CURETTAGE OF UTERUS  ~ 1968  . ELBOW FRACTURE SURGERY Left 2011   "has 2 screws in it"  . EXCISIONAL HEMORRHOIDECTOMY  ~ 1970  . FRACTURE SURGERY  02/12/2016   Right Wrist  . HEMIARTHROPLASTY HIP Left 2011   "had ball replaced"  . INGUINAL HERNIA REPAIR Right 1980s?  [name] MASTECTOMY Left 2005  . ORIF WRIST FRACTURE Left 02/12/2016   Procedure: OPEN REDUCTION INTERNAL FIXATION LEFT DISTAL RADIUS;  Surgeon: Dayna Barker, MD;  Location: [facility];  Service: Plastics;  Laterality: Left;  . TOTAL THYROIDECTOMY  1972  . WRIST FRACTURE SURGERY Right    "has a plate and 7 screws in there"    No Known Allergies  Outpatient Encounter Medications as of 09/15/2019  Medication Sig  . acetaminophen (TYLENOL) 325 MG tablet  Take 2 tablets (650 mg total) by mouth every 4 (four) hours as needed for headache or mild pain.  [name] albuterol (VENTOLIN HFA) 108 (90 Base) MCG/ACT inhaler Inhale 1 puff into the lungs 3 (three) times daily as needed for shortness of breath.  [name] alendronate (FOSAMAX) 70 MG tablet Take 1 tablet (70 mg total) by mouth every 7 (seven) days. Take with a full glass of water on an empty stomach.  . ALPRAZolam (XANAX) 0.5 MG tablet Take 0.5 mg by mouth daily as needed for anxiety.  [name] amLODipine (NORVASC) 5 MG tablet TAKE 1 TABLET BY MOUTH DAILY  . Cholecalciferol (VITAMIN D3) 2000 UNITS TABS Take 1 tablet by mouth daily.   [name] ELIQUIS 5 MG TABS tablet TAKE 1 TABLET BY MOUTH TWICE DAILY  . ibuprofen (ADVIL) 200 MG tablet Take 200 mg  by mouth daily as needed for headache or mild pain.  [name] levothyroxine (SYNTHROID) 150 MCG tablet TAKE 1 TABLET BY MOUTH ONCE DAILY 30 MINUTES BEFORE BREAKFAST  . metoprolol tartrate (LOPRESSOR) 50 MG tablet Take 1 tablet (50 mg total) by mouth 2 (two) times daily.  . rosuvastatin (CRESTOR) 10 MG tablet TAKE 1 TABLET BY MOUTH DAILY  . sertraline (ZOLOFT) 25 MG tablet TAKE 2 TABLETS BY MOUTH DAILY  . triamterene-hydrochlorothiazide (MAXZIDE-25) 37.5-25 MG tablet TAKE 1/2 TABLET BY MOUTH DAILY   No facility-administered encounter medications on file as of 09/15/2019.    Review of Systems:  Review of Systems  Constitutional: Positive for malaise/fatigue. Negative for chills and fever.       Wt gain   HENT: Positive for hearing loss.   Eyes: Negative for blurred vision.  Respiratory: Positive for shortness of breath. Negative for cough, sputum production and wheezing.   Cardiovascular: Positive for leg swelling. Negative for chest pain and palpitations.  Gastrointestinal: Negative for abdominal pain.  Genitourinary: Negative for dysuria.  Musculoskeletal: Positive for joint pain. Negative for falls.  Neurological: Negative for dizziness and loss of consciousness.  Endo/Heme/Allergies: Bruises/bleeds easily.  Psychiatric/Behavioral: Negative for depression. The patient is nervous/anxious. The patient does not have insomnia.     Health Maintenance  Topic Date Due  . INFLUENZA VACCINE  12/21/2019  . TETANUS/TDAP  10/23/2027  . DEXA SCAN  Completed  . COVID-19 Vaccine  Completed  . PNA vac Low Risk Adult  Completed    Physical Exam: Vitals:   09/15/19 0827  BP: 118/62  Pulse: (!) 41  Temp: (!) 97.5 F (36.4 C)  TempSrc: Temporal  SpO2: 98%  Weight: 172 lb (78 kg)  Height: 5\' 4"  (1.626 m)   Body mass index is 29.52 kg/m. Physical Exam Vitals reviewed.  Constitutional:      General: She is not in acute distress.    Appearance: Normal appearance. She is not toxic-appearing.   HENT:     Head: Normocephalic and atraumatic.  Cardiovascular:     Rate and Rhythm: Rhythm irregular.     Heart sounds: Murmur present.  Pulmonary:     Effort: Pulmonary effort is normal.     Breath sounds: Normal breath sounds. No wheezing or rales.     Comments: Dyspneic coming into office from waiting room to exam room and exam room to lab (small office) Abdominal:     General: Bowel sounds are normal.  Musculoskeletal:     Right lower leg: Edema present.     Left lower leg: Edema present.     Comments: 3+ edema of bilateral LEs to above knees  Skin:    General: Skin is warm and dry.  Neurological:     General: No focal deficit present.     Mental Status: She is alert and oriented to person, place, and time.     Cranial Nerves: No cranial nerve deficit.     Motor: No weakness.     Gait: Gait normal.  Psychiatric:     Comments: Chronic flat affect     Labs reviewed: Basic Metabolic Panel: Recent Labs    03/20/19 1039  NA 139  K 4.3  CL 101  CO2 29  GLUCOSE 93  BUN 17  CREATININE 0.91  CALCIUM 9.3  TSH 1.85   Liver Function Tests: Recent Labs    03/20/19 1039  AST 19  ALT 11  BILITOT 0.6  PROT 6.7   No results for input(s): LIPASE, AMYLASE in the last 8760 hours. No results for input(s): AMMONIA in the last 8760 hours. CBC: Recent Labs    03/20/19 1039  WBC 7.8  NEUTROABS 4,298  HGB 12.2  HCT 38.3  MCV 82.7  PLT 218   Lipid Panel: Recent Labs    03/20/19 1039  CHOL 138  HDL 43*  LDLCALC 73  TRIG 135  CHOLHDL 3.2   Lab Results  Component Value Date   HGBA1C 5.8 (H) 11/22/2015    Procedures since last visit: EKG afib at 87bpm, no acute ischemia or infarct   Assessment/Plan 1. Acute congestive heart failure, unspecified heart failure type ([place]) - continue normal maxzide for potassium  - weigh daily and call me back thurs with progress on weight, swelling, dyspnea - add lasix for the next three days  - furosemide (LASIX) 40 MG  tablet; Take 1 tablet (40 mg total) by mouth daily. For 3 days  Dispense: 30 tablet; Refill: 0 - f/u with Dr. Sallyanne Kuster for echo  2. Dyspnea on minimal exertion - suspect chf related, but also will reassess thyroid and renal function, liver - CBC with Differential/Platelet - Brain Natriuretic Peptide - EKG 12-Lead - furosemide (LASIX) 40 MG tablet; Take 1 tablet (40 mg total) by mouth daily. For 3 days  Dispense: 30 tablet; Refill: 0  3. . Acute bronchitis, unspecified organism -resolved s/p zpak and albuterol  4. Longstanding persistent atrial fibrillation (HCC) - rate appears to be controlled, was doing great early march when seen at cardiology - TSH  6. Chronic renal impairment, stage 3a -Avoid nephrotoxic agents like nsaids, dose adjust renally excreted meds, hydrate. - COMPLETE METABOLIC PANEL WITH GFR  7. Hypothyroidism, unspecified type - cont levothyroxine therapy, check tsh today - TSH  8. Hypercholesterolemia - cont crestor therapy - Lipid panel  9. Hyperglycemia - f/u lab - Hemoglobin A1c   Labs/tests ordered:   Orders Placed This Encounter  Procedures  . CBC with Differential/Platelet  . COMPLETE METABOLIC PANEL WITH GFR  . Lipid panel  . TSH  . Hemoglobin A1c  . Brain Natriuretic Peptide  . EKG 12-Lead    Next appt:  4 mos med mgt, come fasting for labs and prn   Concepcion Kirkpatrick L. Bosten Newstrom, D.O. [facility] [address], [place] [address] Cell Phone (Mon-Fri 8am-5pm):  [phone] On Call:  [phone] & follow prompts after 5pm & weekends Office Phone:  [phone] Office Fax:  [phone]

## 2019-09-15 NOTE — Addendum Note (Signed)
Addended by: Gayland Curry on: 09/15/2019 01:17 PM   Modules accepted: Orders

## 2019-09-15 NOTE — Patient Instructions (Signed)
It looks like you are having some volume overload likely from heart failure. I have taken some labs to figure out why this is going on.  We are checking your blood counts, kidneys, thyroid and a heart failure marker.    I'd like you to take a new diuretic (lasix aka furosemide) along with all of your usual medications for the next three days (start when you pick it up today).  It will make you urinate frequently so be prepared for that.   I'd like you to weigh yourself daily going forward to monitor for fluid build-up and so we know if the lasix works for you.  Please call on Thursday to let Charlene know how your weight is running and if the swelling is coming down.  I would like you to follow-up with Dr. Lurline Del office again asap for a repeat echocardiogram (ultrasound of your heart).

## 2019-09-15 NOTE — Progress Notes (Signed)
EKG with afib at 87bpm (on avg).  No acute ischemia or infarct.

## 2019-09-16 LAB — COMPLETE METABOLIC PANEL WITH GFR
AG Ratio: 1.4 (calc) (ref 1.0–2.5)
ALT: 15 U/L (ref 6–29)
AST: 25 U/L (ref 10–35)
Albumin: 3.9 g/dL (ref 3.6–5.1)
Alkaline phosphatase (APISO): 62 U/L (ref 37–153)
BUN/Creatinine Ratio: 16 (calc) (ref 6–22)
BUN: 19 mg/dL (ref 7–25)
CO2: 26 mmol/L (ref 20–32)
Calcium: 9 mg/dL (ref 8.6–10.4)
Chloride: 104 mmol/L (ref 98–110)
Creat: 1.19 mg/dL — ABNORMAL HIGH (ref 0.60–0.93)
GFR, Est African American: 50 mL/min/{1.73_m2} — ABNORMAL LOW (ref >=60)
GFR, Est Non African American: 43 mL/min/{1.73_m2} — ABNORMAL LOW (ref >=60)
Globulin: 2.7 g/dL (calc) (ref 1.9–3.7)
Glucose, Bld: 99 mg/dL (ref 65–99)
Potassium: 3.9 mmol/L (ref 3.5–5.3)
Sodium: 140 mmol/L (ref 135–146)
Total Bilirubin: 0.6 mg/dL (ref 0.2–1.2)
Total Protein: 6.6 g/dL (ref 6.1–8.1)

## 2019-09-16 LAB — CBC WITH DIFFERENTIAL/PLATELET
Absolute Monocytes: 842 cells/uL (ref 200–950)
Basophils Absolute: 101 cells/uL (ref 0–200)
Basophils Relative: 1.3 %
Eosinophils Absolute: 351 cells/uL (ref 15–500)
Eosinophils Relative: 4.5 %
HCT: 35.3 % (ref 35.0–45.0)
Hemoglobin: 10.6 g/dL — ABNORMAL LOW (ref 11.7–15.5)
Lymphs Abs: 2207 cells/uL (ref 850–3900)
MCH: 24.3 pg — ABNORMAL LOW (ref 27.0–33.0)
MCHC: 30 g/dL — ABNORMAL LOW (ref 32.0–36.0)
MCV: 81 fL (ref 80.0–100.0)
MPV: 9.9 fL (ref 7.5–12.5)
Monocytes Relative: 10.8 %
Neutro Abs: 4298 cells/uL (ref 1500–7800)
Neutrophils Relative %: 55.1 %
Platelets: 170 10*3/uL (ref 140–400)
RBC: 4.36 10*6/uL (ref 3.80–5.10)
RDW: 14.5 % (ref 11.0–15.0)
Total Lymphocyte: 28.3 %
WBC: 7.8 10*3/uL (ref 3.8–10.8)

## 2019-09-16 LAB — TSH: TSH: 5.11 mIU/L — ABNORMAL HIGH (ref 0.40–4.50)

## 2019-09-16 LAB — LIPID PANEL
Cholesterol: 99 mg/dL (ref <200)
HDL: 35 mg/dL — ABNORMAL LOW (ref >=50)
LDL Cholesterol (Calc): 45 mg/dL (calc)
Non-HDL Cholesterol (Calc): 64 mg/dL (calc) (ref <130)
Total CHOL/HDL Ratio: 2.8 (calc) (ref <5.0)
Triglycerides: 100 mg/dL (ref <150)

## 2019-09-16 LAB — BRAIN NATRIURETIC PEPTIDE: Brain Natriuretic Peptide: 356 pg/mL — ABNORMAL HIGH (ref <100)

## 2019-09-16 LAB — HEMOGLOBIN A1C
Hgb A1c MFr Bld: 6 % of total Hgb — ABNORMAL HIGH (ref <5.7)
Mean Plasma Glucose: 126 (calc)
eAG (mmol/L): 7 (calc)

## 2019-09-16 NOTE — Progress Notes (Signed)
She is now a bit anemic but this may be due to increased volume in her system. Kidney function is not as good which may be b/c of her heart not pumping as well  we need the echo for more info. Liver and electrolytes were normal Cholesterol is at goal Thyroid is off again--looks like she either missed doses, was taking with other meds or needs a higher dose--please check with her about this.  I may need to adjust her levothyroxine. Sugar average increased to 6 from 5.8 Heart failure marker is elevated, but not severely.  I'm copying Dr. Loletha Grayer on her results

## 2019-09-16 NOTE — Progress Notes (Signed)
Phew! Thanks [employer]

## 2019-09-17 ENCOUNTER — Other Ambulatory Visit: Payer: Self-pay | Admitting: Cardiovascular Disease

## 2019-09-26 ENCOUNTER — Other Ambulatory Visit: Payer: Self-pay | Admitting: Internal Medicine

## 2019-09-26 NOTE — Telephone Encounter (Signed)
Request sent to Dr. Mariea Clonts

## 2019-09-30 ENCOUNTER — Other Ambulatory Visit: Payer: Self-pay

## 2019-09-30 ENCOUNTER — Ambulatory Visit (HOSPITAL_COMMUNITY): Payer: Medicare HMO | Attending: Cardiovascular Disease

## 2019-09-30 DIAGNOSIS — I509 Heart failure, unspecified: Secondary | ICD-10-CM | POA: Diagnosis not present

## 2019-09-30 DIAGNOSIS — R0609 Other forms of dyspnea: Secondary | ICD-10-CM

## 2019-09-30 DIAGNOSIS — R0600 Dyspnea, unspecified: Secondary | ICD-10-CM | POA: Diagnosis not present

## 2019-09-30 DIAGNOSIS — I4811 Longstanding persistent atrial fibrillation: Secondary | ICD-10-CM | POA: Insufficient documentation

## 2019-09-30 NOTE — Progress Notes (Signed)
Deborah Randall's heart is pumping well.  If it's relaxing well could not be assessed. The top chambers of her heart are enlarged from the atrial fibrillation she's had.  She has some backflow through her mitral valve and tricuspid valve.  She has some calcification of her aortic valve.  My impression is that none of these can explain her overt fluid retention she had at her appt.   At her visit, I'd asked her to take the lasix for three days and start weighing herself.  How has that gone?   I'm going to copy Dr. Sallyanne Kuster and see what his thoughts are, as well.

## 2019-10-09 ENCOUNTER — Other Ambulatory Visit: Payer: Self-pay | Admitting: Internal Medicine

## 2019-10-09 DIAGNOSIS — F3289 Other specified depressive episodes: Secondary | ICD-10-CM

## 2019-10-09 NOTE — Telephone Encounter (Signed)
High risk or very high risk warning populated when attempting to refill medication. RX request sent to PCP for review and approval if warranted.   

## 2019-10-15 ENCOUNTER — Other Ambulatory Visit: Payer: Self-pay

## 2019-10-16 ENCOUNTER — Other Ambulatory Visit: Payer: Self-pay | Admitting: Internal Medicine

## 2019-10-16 DIAGNOSIS — T887XXA Unspecified adverse effect of drug or medicament, initial encounter: Secondary | ICD-10-CM

## 2019-10-16 DIAGNOSIS — R0609 Other forms of dyspnea: Secondary | ICD-10-CM

## 2019-10-16 DIAGNOSIS — I509 Heart failure, unspecified: Secondary | ICD-10-CM

## 2019-10-16 MED ORDER — APIXABAN 5 MG PO TABS: 5.0000 mg | ORAL_TABLET | Freq: Two times a day (BID) | ORAL | 0 refills | Status: DC

## 2019-10-17 NOTE — Addendum Note (Signed)
Addended by: Logan Bores on: 10/17/2019 10:53 AM   Modules accepted: Orders

## 2019-10-17 NOTE — Telephone Encounter (Signed)
Please advise, last RX had a note for patient to take for 3 days.  Is this medication meant for long-term use?

## 2019-10-17 NOTE — Telephone Encounter (Signed)
Spoke with patient, scheduled appointment for next Wednesday to check BMP, patient aware she does not have to fast for we are following up on potassium and it does not require that she be fasting.  Future order placed for BMP   Patient verbalized understanding of bringing weight readings to labs appointment next week for Dr.Reed to review

## 2019-10-17 NOTE — Telephone Encounter (Signed)
Ok to renew the lasix for her.  I'd like her to come in next week to get her BMP lab checked to make sure her potassium is ok since she's taking this regularly.  Please also ask her to continue weighing daily and drop [place] a copy of those readings when she gets the lab done.  Thanks.

## 2019-10-22 ENCOUNTER — Other Ambulatory Visit: Payer: Self-pay

## 2019-10-22 ENCOUNTER — Other Ambulatory Visit: Payer: Medicare HMO

## 2019-10-22 DIAGNOSIS — E876 Hypokalemia: Secondary | ICD-10-CM

## 2019-10-22 DIAGNOSIS — R0609 Other forms of dyspnea: Secondary | ICD-10-CM

## 2019-10-22 DIAGNOSIS — I509 Heart failure, unspecified: Secondary | ICD-10-CM | POA: Diagnosis not present

## 2019-10-22 DIAGNOSIS — R0600 Dyspnea, unspecified: Secondary | ICD-10-CM | POA: Diagnosis not present

## 2019-10-22 DIAGNOSIS — T887XXA Unspecified adverse effect of drug or medicament, initial encounter: Secondary | ICD-10-CM | POA: Diagnosis not present

## 2019-10-22 LAB — BASIC METABOLIC PANEL
BUN/Creatinine Ratio: 14 (calc) (ref 6–22)
BUN: 16 mg/dL (ref 7–25)
CO2: 33 mmol/L — ABNORMAL HIGH (ref 20–32)
Calcium: 9 mg/dL (ref 8.6–10.4)
Chloride: 98 mmol/L (ref 98–110)
Creat: 1.17 mg/dL — ABNORMAL HIGH (ref 0.60–0.93)
Glucose, Bld: 93 mg/dL (ref 65–99)
Potassium: 3.2 mmol/L — ABNORMAL LOW (ref 3.5–5.3)
Sodium: 141 mmol/L (ref 135–146)

## 2019-10-22 MED ORDER — POTASSIUM CHLORIDE CRYS ER 20 MEQ PO TBCR: 40.0000 meq | EXTENDED_RELEASE_TABLET | Freq: Every day | ORAL | 1 refills | Status: DC

## 2019-10-22 NOTE — Progress Notes (Signed)
Potassium is low with the two diuretics.  I recommend she begin kcl 54meq daily to replace it.  Please send to her pharmacy

## 2019-12-17 ENCOUNTER — Other Ambulatory Visit: Payer: Self-pay | Admitting: Internal Medicine

## 2019-12-17 ENCOUNTER — Other Ambulatory Visit: Payer: Self-pay | Admitting: Cardiovascular Disease

## 2019-12-17 DIAGNOSIS — R0609 Other forms of dyspnea: Secondary | ICD-10-CM

## 2019-12-17 DIAGNOSIS — I509 Heart failure, unspecified: Secondary | ICD-10-CM

## 2020-01-15 ENCOUNTER — Other Ambulatory Visit: Payer: Self-pay

## 2020-01-15 ENCOUNTER — Encounter: Payer: Self-pay | Admitting: Internal Medicine

## 2020-01-15 ENCOUNTER — Ambulatory Visit (INDEPENDENT_AMBULATORY_CARE_PROVIDER_SITE_OTHER): Payer: Medicare HMO | Admitting: Internal Medicine

## 2020-01-15 VITALS — BP 118/62 | HR 67 | Temp 98.1°F | Ht 64.0 in | Wt 149.4 lb

## 2020-01-15 DIAGNOSIS — N1831 Chronic kidney disease, stage 3a: Secondary | ICD-10-CM

## 2020-01-15 DIAGNOSIS — E039 Hypothyroidism, unspecified: Secondary | ICD-10-CM

## 2020-01-15 DIAGNOSIS — H8112 Benign paroxysmal vertigo, left ear: Secondary | ICD-10-CM

## 2020-01-15 DIAGNOSIS — H8302 Labyrinthitis, left ear: Secondary | ICD-10-CM | POA: Diagnosis not present

## 2020-01-15 DIAGNOSIS — I4811 Longstanding persistent atrial fibrillation: Secondary | ICD-10-CM | POA: Diagnosis not present

## 2020-01-15 DIAGNOSIS — I509 Heart failure, unspecified: Secondary | ICD-10-CM

## 2020-01-15 DIAGNOSIS — I10 Essential (primary) hypertension: Secondary | ICD-10-CM

## 2020-01-15 LAB — CBC WITH DIFFERENTIAL/PLATELET
Absolute Monocytes: 816 cells/uL (ref 200–950)
Basophils Absolute: 72 cells/uL (ref 0–200)
Basophils Relative: 0.9 %
Eosinophils Absolute: 208 cells/uL (ref 15–500)
Eosinophils Relative: 2.6 %
HCT: 39.6 % (ref 35.0–45.0)
Hemoglobin: 12.3 g/dL (ref 11.7–15.5)
Lymphs Abs: 2464 cells/uL (ref 850–3900)
MCH: 24.8 pg — ABNORMAL LOW (ref 27.0–33.0)
MCHC: 31.1 g/dL — ABNORMAL LOW (ref 32.0–36.0)
MCV: 80 fL (ref 80.0–100.0)
MPV: 10.6 fL (ref 7.5–12.5)
Monocytes Relative: 10.2 %
Neutro Abs: 4440 cells/uL (ref 1500–7800)
Neutrophils Relative %: 55.5 %
Platelets: 175 10*3/uL (ref 140–400)
RBC: 4.95 10*6/uL (ref 3.80–5.10)
RDW: 16.9 % — ABNORMAL HIGH (ref 11.0–15.0)
Total Lymphocyte: 30.8 %
WBC: 8 10*3/uL (ref 3.8–10.8)

## 2020-01-15 LAB — BASIC METABOLIC PANEL
BUN/Creatinine Ratio: 11 (calc) (ref 6–22)
BUN: 14 mg/dL (ref 7–25)
CO2: 33 mmol/L — ABNORMAL HIGH (ref 20–32)
Calcium: 9.6 mg/dL (ref 8.6–10.4)
Chloride: 97 mmol/L — ABNORMAL LOW (ref 98–110)
Creat: 1.24 mg/dL — ABNORMAL HIGH (ref 0.60–0.88)
Glucose, Bld: 94 mg/dL (ref 65–99)
Potassium: 3.4 mmol/L — ABNORMAL LOW (ref 3.5–5.3)
Sodium: 139 mmol/L (ref 135–146)

## 2020-01-15 LAB — TSH: TSH: 0.49 mIU/L (ref 0.40–4.50)

## 2020-01-15 MED ORDER — AMOXICILLIN-POT CLAVULANATE 875-125 MG PO TABS: 1.0000 | ORAL_TABLET | Freq: Two times a day (BID) | ORAL | 0 refills | Status: DC

## 2020-01-15 NOTE — Progress Notes (Signed)
Location:  [facility] clinic Provider:  Linder Prajapati L. Mariea Clonts, D.O., C.M.D.  Code Status: DNR Goals of Care:  Advanced Directives 01/15/2020  Does Patient Have a Medical Advance Directive? Yes  Type of Advance Directive -  Does patient want to make changes to medical advance directive? No - Patient declined  Copy of [place] in Chart? -  Would patient like information on creating a medical advance directive? -     Chief Complaint  Patient presents with  . Medical Management of Chronic Issues    4 month follow up  . Acute Visit    dizzy and nausea for several weeks  . Health Maintenance    influenza high dose in not in stock     HPI: Patient is a 79 y.o. female seen today for medical management of chronic diseases.    Some days when she gets up in the mornings, she's woozy-like ad has to hold onto something to get her balance.  She has had a few days where her head felt funny and her balance was off.  She did not go outside or drive or anything.  If she feels that way, she takes benadryl and a nausea pill and feels some better.  Sometimes the dizzy is a spinning.  Felt like top of her head was moving the one day.  Sat on the side of her bed a while and able to get up.  Also feels lightheaded.  A little staggering when walking.  No palpitations.  Left eat itches a lot.  No visual changes.  No falls.   She does drink a lot of water during the day.  Most nausea is with the dizziness.    Some days appetite is good, but other days, she jsut doesn't know what she wants to eat.  Says she eats fairly good.  Weight dropped 172 to 149.  Almost lost 20 lbs of fluid when lasix was started.  She'd been short of breath then and couldn't carry heavy things.  Had to hold railing to pull herself up.  That all got better since taking lasix.  Feet and legs still swell some but nothing like they were.    She worked at the [employer] and then watched her grandson.  He started kindergarten  now.  Past Medical History:  Diagnosis Date  . Anxiety   . Atrial fibrillation with rapid ventricular response ([place]) 07/07/2015  . Bilateral breast cancer ([place])   . Daily headache   . Depression   . GERD (gastroesophageal reflux disease)   . Hyperlipidemia   . Hypertension   . Hypothyroidism   . Iron deficiency anemia 2000s  . Lumbago   . Nausea alone   . Senile osteoporosis   . Squamous cell carcinoma of forehead    "I think they burned it off"    Past Surgical History:  Procedure Laterality Date  . ABDOMINAL HYSTERECTOMY  1970s  . BREAST BIOPSY Bilateral   . BREAST LUMPECTOMY Right 2002   "w/lymph nodes removed"  . CARDIOVERSION N/A 08/19/2015   Procedure: CARDIOVERSION;  Surgeon: Sanda Klein, MD;  Location: [facility];  Service: Cardiovascular;  Laterality: N/A;  . CATARACT EXTRACTION W/ INTRAOCULAR LENS  IMPLANT, BILATERAL Bilateral 2000s  . DILATION AND CURETTAGE OF UTERUS  ~ 1968  . ELBOW FRACTURE SURGERY Left 2011   "has 2 screws in it"  . EXCISIONAL HEMORRHOIDECTOMY  ~ 1970  . FRACTURE SURGERY  02/12/2016   Right Wrist  .  HEMIARTHROPLASTY HIP Left 2011   "had ball replaced"  . INGUINAL HERNIA REPAIR Right 1980s?  [name] MASTECTOMY Left 2005  . ORIF WRIST FRACTURE Left 02/12/2016   Procedure: OPEN REDUCTION INTERNAL FIXATION LEFT DISTAL RADIUS;  Surgeon: Dayna Barker, MD;  Location: [facility];  Service: Plastics;  Laterality: Left;  . TOTAL THYROIDECTOMY  1972  . WRIST FRACTURE SURGERY Right    "has a plate and 7 screws in there"    No Known Allergies  Outpatient Encounter Medications as of 01/15/2020  Medication Sig  . sertraline (ZOLOFT) 25 MG tablet TAKE 2 TABLETS BY MOUTH DAILY  . acetaminophen (TYLENOL) 325 MG tablet Take 2 tablets (650 mg total) by mouth every 4 (four) hours as needed for headache or mild pain.  [name] albuterol (VENTOLIN HFA) 108 (90 Base) MCG/ACT inhaler Inhale 1 puff into the lungs 3 (three) times daily as needed for shortness of breath.  [name]  alendronate (FOSAMAX) 70 MG tablet Take 1 tablet (70 mg total) by mouth every 7 (seven) days. Take with a full glass of water on an empty stomach.  . ALPRAZolam (XANAX) 0.5 MG tablet TAKE 1 TABLET BY MOUTH DAILY FOR ANXIETY  . amLODipine (NORVASC) 5 MG tablet TAKE 1 TABLET BY MOUTH DAILY  . apixaban (ELIQUIS) 5 MG TABS tablet Take 1 tablet (5 mg total) by mouth 2 (two) times daily.  . Cholecalciferol (VITAMIN D3) 2000 UNITS TABS Take 1 tablet by mouth daily.   . furosemide (LASIX) 40 MG tablet TAKE 1 TABLET BY MOUTH DAILY  . ibuprofen (ADVIL) 200 MG tablet Take 200 mg by mouth daily as needed for headache or mild pain.  [name] levothyroxine (SYNTHROID) 150 MCG tablet TAKE 1 TABLET BY MOUTH ONCE DAILY 30 MINUTES BEFORE BREAKFAST  . metoprolol tartrate (LOPRESSOR) 50 MG tablet TAKE 1 TABLET BY MOUTH TWICE DAILY  . potassium chloride SA (KLOR-CON) 20 MEQ tablet Take 2 tablets (40 mEq total) by mouth daily.  . rosuvastatin (CRESTOR) 10 MG tablet TAKE 1 TABLET BY MOUTH DAILY  . triamterene-hydrochlorothiazide (MAXZIDE-25) 37.5-25 MG tablet TAKE 1/2 TABLET BY MOUTH DAILY   No facility-administered encounter medications on file as of 01/15/2020.    Review of Systems:  Review of Systems  Constitutional: Positive for malaise/fatigue. Negative for chills and fever.  HENT: Negative for congestion.   Eyes: Negative for blurred vision.       Glasses  Respiratory: Negative for cough and shortness of breath.   Cardiovascular: Negative for chest pain and palpitations.       Edema much improved  Gastrointestinal: Negative for abdominal pain.  Genitourinary: Negative for dysuria.  Musculoskeletal: Negative for falls.  Skin: Negative for rash.  Neurological: Positive for dizziness. Negative for tingling, sensory change, focal weakness, weakness and headaches.  Psychiatric/Behavioral: Positive for depression. Negative for memory loss. The patient is nervous/anxious. The patient does not have insomnia.      Health Maintenance  Topic Date Due  . INFLUENZA VACCINE  12/21/2019  . TETANUS/TDAP  10/23/2027  . DEXA SCAN  Completed  . COVID-19 Vaccine  Completed  . PNA vac Low Risk Adult  Completed    Physical Exam: Vitals:   01/15/20 0920  BP: 118/62  Pulse: 67  Temp: 98.1 F (36.7 C)  TempSrc: Temporal  SpO2: 98%  Weight: 149 lb 6.4 oz (67.8 kg)  Height: 5\' 4"  (1.626 m)   Body mass index is 25.64 kg/m. Physical Exam Vitals reviewed.  Constitutional:      General: She is  not in acute distress.    Appearance: Normal appearance. She is not toxic-appearing.     Comments: Weight down, swelling improved  HENT:     Right Ear: Tympanic membrane, ear canal and external ear normal.     Left Ear: Ear canal and external ear normal.     Ears:     Comments: Slight dullness of left TM with effusion Eyes:     Comments: Glasses, left nystagmus  Cardiovascular:     Rate and Rhythm: Rhythm irregular.     Heart sounds: No murmur heard.   Pulmonary:     Effort: Pulmonary effort is normal.     Breath sounds: Normal breath sounds. No wheezing, rhonchi or rales.  Abdominal:     General: Bowel sounds are normal.     Palpations: Abdomen is soft.  Musculoskeletal:        General: Normal range of motion.     Right lower leg: Edema present.     Left lower leg: Edema present.     Comments: Edema nonpitting, larger legs  Skin:    General: Skin is warm and dry.  Neurological:     General: No focal deficit present.     Mental Status: She is alert and oriented to person, place, and time. Mental status is at baseline.     Cranial Nerves: No cranial nerve deficit.     Motor: No weakness.     Coordination: Coordination normal.     Gait: Gait normal.  Psychiatric:        Mood and Affect: Mood normal.        Behavior: Behavior normal.     Labs reviewed: Basic Metabolic Panel: Recent Labs    03/20/19 1039 09/15/19 0934 10/22/19 0811  NA 139 140 141  K 4.3 3.9 3.2*  CL 101 104 98   CO2 29 26 33*  GLUCOSE 93 99 93  BUN 17 19 16   CREATININE 0.91 1.19* 1.17*  CALCIUM 9.3 9.0 9.0  TSH 1.85 5.11*  --    Liver Function Tests: Recent Labs    03/20/19 1039 09/15/19 0934  AST 19 25  ALT 11 15  BILITOT 0.6 0.6  PROT 6.7 6.6   No results for input(s): LIPASE, AMYLASE in the last 8760 hours. No results for input(s): AMMONIA in the last 8760 hours. CBC: Recent Labs    03/20/19 1039 09/15/19 0934  WBC 7.8 7.8  NEUTROABS 4,298 4,298  HGB 12.2 10.6*  HCT 38.3 35.3  MCV 82.7 81.0  PLT 218 170   Lipid Panel: Recent Labs    03/20/19 1039 09/15/19 0934  CHOL 138 99  HDL 43* 35*  LDLCALC 73 45  TRIG 135 100  CHOLHDL 3.2 2.8   Lab Results  Component Value Date   HGBA1C 6.0 (H) 09/15/2019     Assessment/Plan 1. Benign paroxysmal positional nystagmus, left -suspect due to effusion in left ear -treat with short course of abx (augmentin) and hope for resolution, if persists, may need PT, also r/o electrolyte abnormalities -this was tough to decipher b/c she said yes to a dizziness description for vertigo, cerebellar and orthostasis/lightheadedness -not orthostatic -will also keep her off the maxzide b/c she was on two diuretics and could have been getting dry - CBC with Differential/Platelet - Basic metabolic panel - TSH  2. Chronic renal impairment, stage 3a -Avoid nephrotoxic agents like nsaids, dose adjust renally excreted meds, hydrate. - Basic metabolic panel  3. Hypothyroidism, unspecified type -f/u TSH  4. Chronic congestive heart failure, unspecified heart failure type ([place]) - appears euvolemic now, cont off maxzide, keep lasix going - Basic metabolic panel  5. Essential hypertension, benign -bp controlled - Basic metabolic panel  6. Longstanding persistent atrial fibrillation (HCC) -cont eliquis NOAC and metoprolol  7. Infection of left inner ear - amoxicillin-clavulanate (AUGMENTIN) 875-125 MG tablet; Take 1 tablet by mouth 2  (two) times daily.  Dispense: 20 tablet; Refill: 0  Labs/tests ordered:   Lab Orders     CBC with Differential/Platelet     Basic metabolic panel     TSH  Next appt:  05/27/2020   Ronell Boldin L. Heidie Krall, D.O. [facility] [address], [place] [address] Cell Phone (Mon-Fri 8am-5pm):  [phone] On Call:  [phone] & follow prompts after 5pm & weekends Office Phone:  [phone] Office Fax:  [phone]

## 2020-01-15 NOTE — Patient Instructions (Signed)
Benign Positional Vertigo Vertigo is the feeling that you or your surroundings are moving when they are not. Benign positional vertigo is the most common form of vertigo. This is usually a harmless condition (benign). This condition is positional. This means that symptoms are triggered by certain movements and positions. This condition can be dangerous if it occurs while you are doing something that could cause harm to you or others. This includes activities such as driving or operating machinery. What are the causes? In many cases, the cause of this condition is not known. It may be caused by a disturbance in an area of the inner ear that helps your brain to sense movement and balance. This disturbance can be caused by:  Viral infection (labyrinthitis).  Head injury.  Repetitive motion, such as jumping, dancing, or running. What increases the risk? You are more likely to develop this condition if:  You are a woman.  You are 50 years of age or older. What are the signs or symptoms? Symptoms of this condition usually happen when you move your head or your eyes in different directions. Symptoms may start suddenly, and usually last for less than a minute. They include:  Loss of balance and falling.  Feeling like you are spinning or moving.  Feeling like your surroundings are spinning or moving.  Nausea and vomiting.  Blurred vision.  Dizziness.  Involuntary eye movement (nystagmus). Symptoms can be mild and cause only minor problems, or they can be severe and interfere with daily life. Episodes of benign positional vertigo may return (recur) over time. Symptoms may improve over time. How is this diagnosed? This condition may be diagnosed based on:  Your medical history.  Physical exam of the head, neck, and ears.  Tests, such as: ? MRI. ? CT scan. ? Eye movement tests. Your health care provider may ask you to change positions quickly while he or she watches you for symptoms  of benign positional vertigo, such as nystagmus. Eye movement may be tested with a variety of exams that are designed to evaluate or stimulate vertigo. ? An electroencephalogram (EEG). This records electrical activity in your brain. ? Hearing tests. You may be referred to a health care provider who specializes in ear, nose, and throat (ENT) problems (otolaryngologist) or a provider who specializes in disorders of the nervous system (neurologist). How is this treated?  This condition may be treated in a session in which your health care provider moves your head in specific positions to adjust your inner ear back to normal. Treatment for this condition may take several sessions. Surgery may be needed in severe cases, but this is rare. In some cases, benign positional vertigo may resolve on its own in 2-4 weeks. Follow these instructions at home: Safety  Move slowly. Avoid sudden body or head movements or certain positions, as told by your health care provider.  Avoid driving until your health care provider says it is safe for you to do so.  Avoid operating heavy machinery until your health care provider says it is safe for you to do so.  Avoid doing any tasks that would be dangerous to you or others if vertigo occurs.  If you have trouble walking or keeping your balance, try using a cane for stability. If you feel dizzy or unstable, sit down right away.  Return to your normal activities as told by your health care provider. Ask your health care provider what activities are safe for you. General instructions  Take over-the-counter   and prescription medicines only as told by your health care provider.  Drink enough fluid to keep your urine pale yellow.  Keep all follow-up visits as told by your health care provider. This is important. Contact a health care provider if:  You have a fever.  Your condition gets worse or you develop new symptoms.  Your family or friends notice any  behavioral changes.  You have nausea or vomiting that gets worse.  You have numbness or a "pins and needles" sensation. Get help right away if you:  Have difficulty speaking or moving.  Are always dizzy.  Faint.  Develop severe headaches.  Have weakness in your legs or arms.  Have changes in your hearing or vision.  Develop a stiff neck.  Develop sensitivity to light. Summary  Vertigo is the feeling that you or your surroundings are moving when they are not. Benign positional vertigo is the most common form of vertigo.  The cause of this condition is not known. It may be caused by a disturbance in an area of the inner ear that helps your brain to sense movement and balance.  Symptoms include loss of balance and falling, feeling that you or your surroundings are moving, nausea and vomiting, and blurred vision.  This condition can be diagnosed based on symptoms, physical exam, and other tests, such as MRI, CT scan, eye movement tests, and hearing tests.  Follow safety instructions as told by your health care provider. You will also be told when to contact your health care provider in case of problems. This information is not intended to replace advice given to you by your health care provider. Make sure you discuss any questions you have with your health care provider. Document Revised: 10/17/2017 Document Reviewed: 10/17/2017 Elsevier Patient Education  2020 [employer]  

## 2020-01-16 ENCOUNTER — Other Ambulatory Visit: Payer: Self-pay | Admitting: Internal Medicine

## 2020-01-16 NOTE — Telephone Encounter (Signed)
79yo Female 67.8kg A.Fib Last OV 07/23/19 Scr = 1.24

## 2020-01-16 NOTE — Progress Notes (Signed)
Blood counts have normalized. Kidney function has declined and potassium is too low with both diuretics.  Stop the triamterene/hctz.   I'd like for her to come back in for a bp and weight check and another BMP in one week to make sure her blood pressure and weight do not go up too much without the medication.

## 2020-01-19 ENCOUNTER — Ambulatory Visit (INDEPENDENT_AMBULATORY_CARE_PROVIDER_SITE_OTHER): Payer: Medicare HMO | Admitting: Internal Medicine

## 2020-01-19 ENCOUNTER — Other Ambulatory Visit: Payer: Self-pay

## 2020-01-19 ENCOUNTER — Encounter: Payer: Self-pay | Admitting: Internal Medicine

## 2020-01-19 VITALS — BP 122/82 | HR 87 | Temp 97.7°F | Wt 149.6 lb

## 2020-01-19 DIAGNOSIS — I10 Essential (primary) hypertension: Secondary | ICD-10-CM | POA: Diagnosis not present

## 2020-01-19 DIAGNOSIS — I509 Heart failure, unspecified: Secondary | ICD-10-CM | POA: Diagnosis not present

## 2020-01-19 DIAGNOSIS — N1831 Chronic kidney disease, stage 3a: Secondary | ICD-10-CM

## 2020-01-19 DIAGNOSIS — H8302 Labyrinthitis, left ear: Secondary | ICD-10-CM

## 2020-01-19 DIAGNOSIS — H8112 Benign paroxysmal vertigo, left ear: Secondary | ICD-10-CM | POA: Diagnosis not present

## 2020-01-19 NOTE — Patient Instructions (Signed)
Be very careful walking while you are dizzy. Drink plenty of water.   We will call you with your lab results. Eat yogurt with the antibiotic. Please call me back if you are staying dizzy after you finish the antibiotics so you can come back in.

## 2020-01-19 NOTE — Progress Notes (Signed)
Location:  [facility] clinic Provider:  Phinley Schall L. Mariea Clonts, D.O., C.M.D.  Goals of Care:  Advanced Directives 01/19/2020  Does Patient Have a Medical Advance Directive? No  Type of Advance Directive -  Does patient want to make changes to medical advance directive? No - Patient declined  Copy of [place] in Chart? -  Would patient like information on creating a medical advance directive? -     Chief Complaint  Patient presents with  . Acute Visit     follow up on weight check , blood pressure check and BMP    HPI: Patient is a 79 y.o. female seen today for follow-up on weight, bp and bmp.  Her head feels a little groggy.  She is still off maxzide. Wt up a little (less than a lb since last week).  BP is still great.  She is getting diarrhea with the augmentin.  Recommended probiotic yogurt with her antibiotics.    Past Medical History:  Diagnosis Date  . Anxiety   . Atrial fibrillation with rapid ventricular response ([place]) 07/07/2015  . Bilateral breast cancer ([place])   . Daily headache   . Depression   . GERD (gastroesophageal reflux disease)   . Hyperlipidemia   . Hypertension   . Hypothyroidism   . Iron deficiency anemia 2000s  . Lumbago   . Nausea alone   . Senile osteoporosis   . Squamous cell carcinoma of forehead    "I think they burned it off"    Past Surgical History:  Procedure Laterality Date  . ABDOMINAL HYSTERECTOMY  1970s  . BREAST BIOPSY Bilateral   . BREAST LUMPECTOMY Right 2002   "w/lymph nodes removed"  . CARDIOVERSION N/A 08/19/2015   Procedure: CARDIOVERSION;  Surgeon: Sanda Klein, MD;  Location: [facility];  Service: Cardiovascular;  Laterality: N/A;  . CATARACT EXTRACTION W/ INTRAOCULAR LENS  IMPLANT, BILATERAL Bilateral 2000s  . DILATION AND CURETTAGE OF UTERUS  ~ 1968  . ELBOW FRACTURE SURGERY Left 2011   "has 2 screws in it"  . EXCISIONAL HEMORRHOIDECTOMY  ~ 1970  . FRACTURE SURGERY  02/12/2016   Right Wrist  .  HEMIARTHROPLASTY HIP Left 2011   "had ball replaced"  . INGUINAL HERNIA REPAIR Right 1980s?  [name] MASTECTOMY Left 2005  . ORIF WRIST FRACTURE Left 02/12/2016   Procedure: OPEN REDUCTION INTERNAL FIXATION LEFT DISTAL RADIUS;  Surgeon: Dayna Barker, MD;  Location: [facility];  Service: Plastics;  Laterality: Left;  . TOTAL THYROIDECTOMY  1972  . WRIST FRACTURE SURGERY Right    "has a plate and 7 screws in there"    No Known Allergies  Outpatient Encounter Medications as of 01/19/2020  Medication Sig  . acetaminophen (TYLENOL) 325 MG tablet Take 2 tablets (650 mg total) by mouth every 4 (four) hours as needed for headache or mild pain.  [name] albuterol (VENTOLIN HFA) 108 (90 Base) MCG/ACT inhaler Inhale 1 puff into the lungs 3 (three) times daily as needed for shortness of breath.  [name] alendronate (FOSAMAX) 70 MG tablet Take 1 tablet (70 mg total) by mouth every 7 (seven) days. Take with a full glass of water on an empty stomach.  . ALPRAZolam (XANAX) 0.5 MG tablet TAKE 1 TABLET BY MOUTH DAILY FOR ANXIETY  . amLODipine (NORVASC) 5 MG tablet TAKE 1 TABLET BY MOUTH DAILY  . amoxicillin-clavulanate (AUGMENTIN) 875-125 MG tablet Take 1 tablet by mouth 2 (two) times daily.  . Cholecalciferol (VITAMIN D3) 2000 UNITS TABS Take 1 tablet  by mouth daily.   [name] ELIQUIS 5 MG TABS tablet TAKE 1 TABLET BY MOUTH TWICE DAILY  . furosemide (LASIX) 40 MG tablet TAKE 1 TABLET BY MOUTH DAILY  . ibuprofen (ADVIL) 200 MG tablet Take 200 mg by mouth daily as needed for headache or mild pain.  [name] levothyroxine (SYNTHROID) 150 MCG tablet TAKE 1 TABLET BY MOUTH ONCE DAILY 30 MINUTES BEFORE BREAKFAST  . metoprolol tartrate (LOPRESSOR) 50 MG tablet TAKE 1 TABLET BY MOUTH TWICE DAILY  . potassium chloride SA (KLOR-CON) 20 MEQ tablet Take 2 tablets (40 mEq total) by mouth daily.  . rosuvastatin (CRESTOR) 10 MG tablet TAKE 1 TABLET BY MOUTH DAILY  . sertraline (ZOLOFT) 25 MG tablet TAKE 2 TABLETS BY MOUTH DAILY   No  facility-administered encounter medications on file as of 01/19/2020.    Review of Systems:  Review of Systems  Constitutional: Negative for chills and fever.  HENT: Negative for congestion and sore throat.   Eyes: Negative for blurred vision.  Respiratory: Negative for cough and shortness of breath.   Cardiovascular: Negative for chest pain, palpitations and leg swelling.  Gastrointestinal: Positive for diarrhea. Negative for abdominal pain, blood in stool, constipation and melena.  Genitourinary: Negative for dysuria.  Musculoskeletal: Negative for falls and joint pain.  Skin: Negative for itching and rash.  Neurological: Positive for dizziness. Negative for loss of consciousness and weakness.       Unsteady walking  Endo/Heme/Allergies: Bruises/bleeds easily.  Psychiatric/Behavioral: Negative for memory loss.    Health Maintenance  Topic Date Due  . INFLUENZA VACCINE  01/19/2020 (Originally 12/21/2019)  . TETANUS/TDAP  10/23/2027  . DEXA SCAN  Completed  . COVID-19 Vaccine  Completed  . PNA vac Low Risk Adult  Completed    Physical Exam: Vitals:   01/19/20 1045  BP: 122/82  Pulse: 87  Temp: 97.7 F (36.5 C)  TempSrc: Temporal  SpO2: 95%   There is no height or weight on file to calculate BMI. Physical Exam Vitals reviewed.  Constitutional:      General: She is not in acute distress.    Appearance: Normal appearance. She is not toxic-appearing.  Cardiovascular:     Rate and Rhythm: Rhythm irregular.     Heart sounds: No murmur heard.   Pulmonary:     Effort: Pulmonary effort is normal.     Breath sounds: Normal breath sounds. No wheezing, rhonchi or rales.  Abdominal:     General: Bowel sounds are normal.  Musculoskeletal:        General: Normal range of motion.     Cervical back: Neck supple.     Right lower leg: No edema.     Left lower leg: No edema.     Comments: Ankles not pitting; enlarged ankles  Skin:    General: Skin is warm and dry.   Neurological:     General: No focal deficit present.     Mental Status: She is alert and oriented to person, place, and time.     Cranial Nerves: No cranial nerve deficit.     Sensory: No sensory deficit.     Motor: No weakness.     Coordination: Coordination normal.     Gait: Gait abnormal.     Deep Tendon Reflexes: Reflexes normal.  Psychiatric:        Mood and Affect: Mood normal.     Labs reviewed: Basic Metabolic Panel: Recent Labs    03/20/19 1039 03/20/19 1039 09/15/19 0934 10/22/19 0811 01/15/20  1009  NA 139   < > 140 141 139  K 4.3   < > 3.9 3.2* 3.4*  CL 101   < > 104 98 97*  CO2 29   < > 26 33* 33*  GLUCOSE 93   < > 99 93 94  BUN 17   < > 19 16 14   CREATININE 0.91   < > 1.19* 1.17* 1.24*  CALCIUM 9.3   < > 9.0 9.0 9.6  TSH 1.85  --  5.11*  --  0.49   < > = values in this interval not displayed.   Liver Function Tests: Recent Labs    03/20/19 1039 09/15/19 0934  AST 19 25  ALT 11 15  BILITOT 0.6 0.6  PROT 6.7 6.6   No results for input(s): LIPASE, AMYLASE in the last 8760 hours. No results for input(s): AMMONIA in the last 8760 hours. CBC: Recent Labs    03/20/19 1039 09/15/19 0934 01/15/20 1009  WBC 7.8 7.8 8.0  NEUTROABS 4,298 4,298 4,440  HGB 12.2 10.6* 12.3  HCT 38.3 35.3 39.6  MCV 82.7 81.0 80.0  PLT 218 170 175   Lipid Panel: Recent Labs    03/20/19 1039 09/15/19 0934  CHOL 138 99  HDL 43* 35*  LDLCALC 73 45  TRIG 135 100  CHOLHDL 3.2 2.8   Lab Results  Component Value Date   HGBA1C 6.0 (H) 09/15/2019   Assessment/Plan 1. Benign paroxysmal positional nystagmus, left -ongoing, complete abx for inner ear infection and if no better afterward, will refer for therapy  2. Chronic renal impairment, stage 3a -Avoid nephrotoxic agents like nsaids, dose adjust renally excreted meds, hydrate. - Basic metabolic panel  3. Chronic congestive heart failure, unspecified heart failure type ([place]) - appears euvolemic, I was concerned  she may have been dry initially with two diuretics - Basic metabolic panel  4. Infection of left inner ear -finish off augmentin course, take with yogurt to help prevent diarrhea -if not getting better with dizziness, call back  5. Essential hypertension, benign -bp at goal with current therapy, cont same regimen, stay off maxzide  Labs/tests ordered:   Lab Orders     Basic metabolic panel  Next appt:  05/17/2020   Greenly Rarick L. Deamonte Sayegh, D.O. [facility] [address], [place] [address] Cell Phone (Mon-Fri 8am-5pm):  [phone] On Call:  [phone] & follow prompts after 5pm & weekends Office Phone:  [phone] Office Fax:  [phone]

## 2020-01-20 LAB — BASIC METABOLIC PANEL
BUN/Creatinine Ratio: 12 (calc) (ref 6–22)
BUN: 12 mg/dL (ref 7–25)
CO2: 31 mmol/L (ref 20–32)
Calcium: 9.6 mg/dL (ref 8.6–10.4)
Chloride: 103 mmol/L (ref 98–110)
Creat: 1.02 mg/dL — ABNORMAL HIGH (ref 0.60–0.88)
Glucose, Bld: 101 mg/dL — ABNORMAL HIGH (ref 65–99)
Potassium: 4.5 mmol/L (ref 3.5–5.3)
Sodium: 141 mmol/L (ref 135–146)

## 2020-01-20 NOTE — Progress Notes (Signed)
Kidney function looks a lot better off maxzide so do not restart it.

## 2020-02-09 ENCOUNTER — Other Ambulatory Visit: Payer: Self-pay | Admitting: Internal Medicine

## 2020-02-09 DIAGNOSIS — E038 Other specified hypothyroidism: Secondary | ICD-10-CM

## 2020-02-10 ENCOUNTER — Other Ambulatory Visit: Payer: Self-pay

## 2020-02-16 ENCOUNTER — Other Ambulatory Visit: Payer: Self-pay | Admitting: Internal Medicine

## 2020-02-16 DIAGNOSIS — R0609 Other forms of dyspnea: Secondary | ICD-10-CM

## 2020-02-16 DIAGNOSIS — I509 Heart failure, unspecified: Secondary | ICD-10-CM

## 2020-02-16 NOTE — Telephone Encounter (Signed)
rx sent to pharmacy by e-script  

## 2020-04-13 ENCOUNTER — Other Ambulatory Visit: Payer: Self-pay | Admitting: Family

## 2020-04-13 DIAGNOSIS — F3289 Other specified depressive episodes: Secondary | ICD-10-CM

## 2020-04-22 ENCOUNTER — Other Ambulatory Visit: Payer: Self-pay | Admitting: Internal Medicine

## 2020-04-22 DIAGNOSIS — R0600 Dyspnea, unspecified: Secondary | ICD-10-CM

## 2020-04-22 DIAGNOSIS — R0609 Other forms of dyspnea: Secondary | ICD-10-CM

## 2020-04-22 DIAGNOSIS — I509 Heart failure, unspecified: Secondary | ICD-10-CM

## 2020-05-17 ENCOUNTER — Ambulatory Visit: Payer: Medicare HMO | Admitting: Internal Medicine

## 2020-05-27 ENCOUNTER — Ambulatory Visit (INDEPENDENT_AMBULATORY_CARE_PROVIDER_SITE_OTHER): Payer: Medicare HMO | Admitting: Family

## 2020-05-27 ENCOUNTER — Encounter: Payer: Self-pay | Admitting: Family

## 2020-05-27 ENCOUNTER — Telehealth: Payer: Self-pay

## 2020-05-27 ENCOUNTER — Other Ambulatory Visit: Payer: Self-pay

## 2020-05-27 DIAGNOSIS — Z0000 Encounter for general adult medical examination without abnormal findings: Secondary | ICD-10-CM | POA: Diagnosis not present

## 2020-05-27 NOTE — Patient Instructions (Signed)
Deborah Randall , Thank you for taking time to come for your Medicare Wellness Visit. I appreciate your ongoing commitment to your health goals. Please review the following plan we discussed and let me know if I can assist you in the future.   Screening recommendations/referrals: Colonoscopy: N/A  Mammogram: Up to date  Bone Density: Ordered today Imaging center will you for appointment  Recommended yearly ophthalmology/optometry visit for glaucoma screening and checkup Recommended yearly dental visit for hygiene and checkup  Vaccinations: Influenza vaccine :Up to date  Pneumococcal vaccine : Up to date  Tdap vaccine : Up to date  Shingles vaccine : Up to date    Advanced directives: No   Conditions/risks identified: Advance Age female > 64 yrs,Hypertension   Next appointment: 1 year   Preventive Care 57 Years and Older, Female Preventive care refers to lifestyle choices and visits with your health care provider that can promote health and wellness. What does preventive care include?  A yearly physical exam. This is also called an annual well check.  Dental exams once or twice a year.  Routine eye exams. Ask your health care provider how often you should have your eyes checked.  Personal lifestyle choices, including:  Daily care of your teeth and gums.  Regular physical activity.  Eating a healthy diet.  Avoiding tobacco and drug use.  Limiting alcohol use.  Practicing safe sex.  Taking low-dose aspirin every day.  Taking vitamin and mineral supplements as recommended by your health care provider. What happens during an annual well check? The services and screenings done by your health care provider during your annual well check will depend on your age, overall health, lifestyle risk factors, and family history of disease. Counseling  Your health care provider may ask you questions about your:  Alcohol use.  Tobacco use.  Drug use.  Emotional well-being.  Home  and relationship well-being.  Sexual activity.  Eating habits.  History of falls.  Memory and ability to understand (cognition).  Work and work Astronomer.  Reproductive health. Screening  You may have the following tests or measurements:  Height, weight, and BMI.  Blood pressure.  Lipid and cholesterol levels. These may be checked every 5 years, or more frequently if you are over 20 years old.  Skin check.  Lung cancer screening. You may have this screening every year starting at age 26 if you have a 30-pack-year history of smoking and currently smoke or have quit within the past 15 years.  Fecal occult blood test (FOBT) of the stool. You may have this test every year starting at age 29.  Flexible sigmoidoscopy or colonoscopy. You may have a sigmoidoscopy every 5 years or a colonoscopy every 10 years starting at age 44.  Hepatitis C blood test.  Hepatitis B blood test.  Sexually transmitted disease (STD) testing.  Diabetes screening. This is done by checking your blood sugar (glucose) after you have not eaten for a while (fasting). You may have this done every 1-3 years.  Bone density scan. This is done to screen for osteoporosis. You may have this done starting at age 55.  Mammogram. This may be done every 1-2 years. Talk to your health care provider about how often you should have regular mammograms. Talk with your health care provider about your test results, treatment options, and if necessary, the need for more tests. Vaccines  Your health care provider may recommend certain vaccines, such as:  Influenza vaccine. This is recommended every year.  Tetanus, diphtheria, and acellular pertussis (Tdap, Td) vaccine. You may need a Td booster every 10 years.  Zoster vaccine. You may need this after age 30.  Pneumococcal 13-valent conjugate (PCV13) vaccine. One dose is recommended after age 24.  Pneumococcal polysaccharide (PPSV23) vaccine. One dose is recommended  after age 12. Talk to your health care provider about which screenings and vaccines you need and how often you need them. This information is not intended to replace advice given to you by your health care provider. Make sure you discuss any questions you have with your health care provider. Document Released: 06/04/2015 Document Revised: 01/26/2016 Document Reviewed: 03/09/2015 Elsevier Interactive Patient Education  2017 [employer] Prevention in the Home Falls can cause injuries. They can happen to people of all ages. There are many things you can do to make your home safe and to help prevent falls. What can I do on the outside of my home?  Regularly fix the edges of walkways and driveways and fix any cracks.  Remove anything that might make you trip as you walk through a door, such as a raised step or threshold.  Trim any bushes or trees on the path to your home.  Use bright outdoor lighting.  Clear any walking paths of anything that might make someone trip, such as rocks or tools.  Regularly check to see if handrails are loose or broken. Make sure that both sides of any steps have handrails.  Any raised decks and porches should have guardrails on the edges.  Have any leaves, snow, or ice cleared regularly.  Use sand or salt on walking paths during winter.  Clean up any spills in your garage right away. This includes oil or grease spills. What can I do in the bathroom?  Use night lights.  Install grab bars by the toilet and in the tub and shower. Do not use towel bars as grab bars.  Use non-skid mats or decals in the tub or shower.  If you need to sit down in the shower, use a plastic, non-slip stool.  Keep the floor dry. Clean up any water that spills on the floor as soon as it happens.  Remove soap buildup in the tub or shower regularly.  Attach bath mats securely with double-sided non-slip rug tape.  Do not have throw rugs and other things on the floor  that can make you trip. What can I do in the bedroom?  Use night lights.  Make sure that you have a light by your bed that is easy to reach.  Do not use any sheets or blankets that are too big for your bed. They should not hang down onto the floor.  Have a firm chair that has side arms. You can use this for support while you get dressed.  Do not have throw rugs and other things on the floor that can make you trip. What can I do in the kitchen?  Clean up any spills right away.  Avoid walking on wet floors.  Keep items that you use a lot in easy-to-reach places.  If you need to reach something above you, use a strong step stool that has a grab bar.  Keep electrical cords out of the way.  Do not use floor polish or wax that makes floors slippery. If you must use wax, use non-skid floor wax.  Do not have throw rugs and other things on the floor that can make you trip. What can I do  with my stairs?  Do not leave any items on the stairs.  Make sure that there are handrails on both sides of the stairs and use them. Fix handrails that are broken or loose. Make sure that handrails are as long as the stairways.  Check any carpeting to make sure that it is firmly attached to the stairs. Fix any carpet that is loose or worn.  Avoid having throw rugs at the top or bottom of the stairs. If you do have throw rugs, attach them to the floor with carpet tape.  Make sure that you have a light switch at the top of the stairs and the bottom of the stairs. If you do not have them, ask someone to add them for you. What else can I do to help prevent falls?  Wear shoes that:  Do not have high heels.  Have rubber bottoms.  Are comfortable and fit you well.  Are closed at the toe. Do not wear sandals.  If you use a stepladder:  Make sure that it is fully opened. Do not climb a closed stepladder.  Make sure that both sides of the stepladder are locked into place.  Ask someone to hold it  for you, if possible.  Clearly mark and make sure that you can see:  Any grab bars or handrails.  First and last steps.  Where the edge of each step is.  Use tools that help you move around (mobility aids) if they are needed. These include:  Canes.  Walkers.  Scooters.  Crutches.  Turn on the lights when you go into a dark area. Replace any light bulbs as soon as they burn out.  Set up your furniture so you have a clear path. Avoid moving your furniture around.  If any of your floors are uneven, fix them.  If there are any pets around you, be aware of where they are.  Review your medicines with your doctor. Some medicines can make you feel dizzy. This can increase your chance of falling. Ask your doctor what other things that you can do to help prevent falls. This information is not intended to replace advice given to you by your health care provider. Make sure you discuss any questions you have with your health care provider. Document Released: 03/04/2009 Document Revised: 10/14/2015 Document Reviewed: 06/12/2014 Elsevier Interactive Patient Education  2017 [employer].

## 2020-05-27 NOTE — Telephone Encounter (Signed)
Deborah Randall, Deborah Randall are scheduled for a virtual visit with your provider today.    Just as we do with appointments in the office, we must obtain your consent to participate.  Your consent will be active for this visit and any virtual visit you may have with one of our providers in the next 365 days.    If you have a MyChart account, I can also send a copy of this consent to you electronically.  All virtual visits are billed to your insurance company just like a traditional visit in the office.  As this is a virtual visit, video technology does not allow for your provider to perform a traditional examination.  This may limit your provider's ability to fully assess your condition.  If your provider identifies any concerns that need to be evaluated in person or the need to arrange testing such as labs, EKG, etc, we will make arrangements to do so.    Although advances in technology are sophisticated, we cannot ensure that it will always work on either your end or our end.  If the connection with a video visit is poor, we may have to switch to a telephone visit.  With either a video or telephone visit, we are not always able to ensure that we have a secure connection.   I need to obtain your verbal consent now.   Are you willing to proceed with your visit today?   Deborah Randall has provided verbal consent on 05/27/2020 for a virtual visit (video or telephone).   Dicky Doe, CMA 05/27/2020  11:14 AM

## 2020-05-27 NOTE — Progress Notes (Signed)
This service is provided via telemedicine  No vital signs collected/recorded due to the encounter was a telemedicine visit.   Location of patient (ex: home, work): Home.  Patient consents to a telephone visit: Yes.  Location of the provider (ex: office, home):  [facility].  Name of any referring provider: Kermit Balo, DO   Names of all persons participating in the telemedicine service and their role in the encounter: Patient, Meda Klinefelter, RMA, Hamsa Laurich, Carilyn Goodpasture, NP.    Time spent on call: 8 minutes spent on the phone with Medical Assistant.      Subjective:   Deborah Randall is a 80 y.o. female who presents for Medicare Annual (Subsequent) preventive examination.  Review of Systems     Cardiac Risk Factors include: advanced age (>63men, >38 women);hypertension     Objective:    There were no vitals filed for this visit. There is no height or weight on file to calculate BMI.  Advanced Directives 05/27/2020 01/19/2020 01/15/2020 09/15/2019 05/26/2019 03/20/2019 05/20/2018  Does Patient Have a Medical Advance Directive? No No Yes No Yes Yes Yes  Type of Advance Directive - - - - Living will Living will Living will  Does patient want to make changes to medical advance directive? - No - Patient declined No - Patient declined - No - Patient declined No - Patient declined No - Patient declined  Copy of Healthcare Power of Attorney in Chart? - - - - - - -  Would patient like information on creating a medical advance directive? No - Patient declined - - No - Patient declined - - -    Current Medications (verified) Outpatient Encounter Medications as of 05/27/2020  Medication Sig  . acetaminophen (TYLENOL) 325 MG tablet Take 2 tablets (650 mg total) by mouth every 4 (four) hours as needed for headache or mild pain.  [name] albuterol (VENTOLIN HFA) 108 (90 Base) MCG/ACT inhaler Inhale 1 puff into the lungs 3 (three) times daily as needed for shortness of breath.  [name] alendronate  (FOSAMAX) 70 MG tablet Take 1 tablet (70 mg total) by mouth every 7 (seven) days. Take with a full glass of water on an empty stomach.  . ALPRAZolam (XANAX) 0.5 MG tablet TAKE 1 TABLET BY MOUTH DAILY FOR ANXIETY  . amLODipine (NORVASC) 5 MG tablet TAKE 1 TABLET BY MOUTH DAILY  . amoxicillin-clavulanate (AUGMENTIN) 875-125 MG tablet Take 1 tablet by mouth 2 (two) times daily.  . Cholecalciferol (VITAMIN D3) 2000 UNITS TABS Take 1 tablet by mouth daily.   [name] ELIQUIS 5 MG TABS tablet TAKE 1 TABLET BY MOUTH TWICE DAILY  . furosemide (LASIX) 40 MG tablet TAKE 1 TABLET BY MOUTH DAILY  . ibuprofen (ADVIL) 200 MG tablet Take 200 mg by mouth daily as needed for headache or mild pain.  [name] levothyroxine (SYNTHROID) 150 MCG tablet TAKE 1 TABLET BY MOUTH DAILY 30 MINUTES BEFORE BREAKFAST  . metoprolol tartrate (LOPRESSOR) 50 MG tablet TAKE 1 TABLET BY MOUTH TWICE DAILY  . potassium chloride SA (KLOR-CON) 20 MEQ tablet Take 2 tablets (40 mEq total) by mouth daily.  . rosuvastatin (CRESTOR) 10 MG tablet TAKE 1 TABLET BY MOUTH DAILY  . sertraline (ZOLOFT) 25 MG tablet TAKE 2 TABLETS BY MOUTH DAILY   No facility-administered encounter medications on file as of 05/27/2020.    Allergies (verified) Patient has no known allergies.   History: Past Medical History:  Diagnosis Date  . Anxiety   . Atrial fibrillation with  rapid ventricular response ([place]) 07/07/2015  . Bilateral breast cancer ([place])   . Daily headache   . Depression   . GERD (gastroesophageal reflux disease)   . Hyperlipidemia   . Hypertension   . Hypothyroidism   . Iron deficiency anemia 2000s  . Lumbago   . Nausea alone   . Senile osteoporosis   . Squamous cell carcinoma of forehead    "I think they burned it off"   Past Surgical History:  Procedure Laterality Date  . ABDOMINAL HYSTERECTOMY  1970s  . BREAST BIOPSY Bilateral   . BREAST LUMPECTOMY Right 2002   "w/lymph nodes removed"  . CARDIOVERSION N/A 08/19/2015   Procedure:  CARDIOVERSION;  Surgeon: Sanda Klein, MD;  Location: [facility];  Service: Cardiovascular;  Laterality: N/A;  . CATARACT EXTRACTION W/ INTRAOCULAR LENS  IMPLANT, BILATERAL Bilateral 2000s  . DILATION AND CURETTAGE OF UTERUS  ~ 1968  . ELBOW FRACTURE SURGERY Left 2011   "has 2 screws in it"  . EXCISIONAL HEMORRHOIDECTOMY  ~ 1970  . FRACTURE SURGERY  02/12/2016   Right Wrist  . HEMIARTHROPLASTY HIP Left 2011   "had ball replaced"  . INGUINAL HERNIA REPAIR Right 1980s?  [name] MASTECTOMY Left 2005  . ORIF WRIST FRACTURE Left 02/12/2016   Procedure: OPEN REDUCTION INTERNAL FIXATION LEFT DISTAL RADIUS;  Surgeon: Dayna Barker, MD;  Location: [facility];  Service: Plastics;  Laterality: Left;  . TOTAL THYROIDECTOMY  1972  . WRIST FRACTURE SURGERY Right    "has a plate and 7 screws in there"   Family History  Problem Relation Age of Onset  . Cancer Father   . Heart attack Brother   . Mental illness Son   . Other Brother   . Other Brother   . Cancer Sister   . Heart disease Sister   . Hypertension Sister   . Other Brother   . Diabetes Brother   . COPD Brother   . Mental illness Son    Social History   Socioeconomic History  . Marital status: Widowed    Spouse name: Not on file  . Number of children: Not on file  . Years of education: Not on file  . Highest education level: Not on file  Occupational History  . 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 Smoking status: Never Smoker  . Smokeless tobacco: Never Used  Vaping Use  . Vaping Use: Never used  Substance and Sexual Activity  . Alcohol use: No  . Drug use: No  . Sexual activity: Never  Other Topics Concern  . Not on file  Social History Narrative   Lives alone   Widow   2 sons , one son decease, one son disable    Works at [employer] in [employer]   Enjoys going places with friends   Never smoked   Alcohol none   Exercise -walk, works in yard    Social Determinants of Adult nurse Strain: Not on [employer] Insecurity: Not on file  Transportation Needs: Not on file  Physical Activity: Not on file  Stress: Not on file  Social Connections: Not on file    Tobacco Counseling Counseling given: Not Answered   Clinical Intake:  Pre-visit preparation completed: No  Pain : No/denies pain     BMI - recorded: 25.68 (latest BMI) Nutritional Status: BMI 25 -29 Overweight Nutritional Risks: Nausea/ vomitting/ diarrhea (has nausea occassinal which is chronic for her) Diabetes: No  How often do you need to have someone help you when you read instructions, pamphlets, or other written materials from your doctor or pharmacy?: 1 - Never What is the last grade level you completed in school?: 12 grade  Diabetic? No   Interpreter Needed?: No  Information entered by :: Eleri Ruben FNP-C   Activities of Daily Living In your present state of health, do you have any difficulty performing the following activities: 05/27/2020  Hearing? N  Vision? N  Difficulty concentrating or making decisions? N  Walking or climbing stairs? Y  Comment uses a cane  Dressing or bathing? N  Doing errands, shopping? N  Preparing Food and eating ? N  Using the Toilet? N  In the past six months, have you accidently leaked urine? N  Do you have problems with loss of bowel control? N  Managing your Medications? N  Managing your Finances? N  Housekeeping or managing your Housekeeping? N  Some recent data might be hidden    Patient Care Team: Gayland Curry, DO as PCP - General (Geriatric Medicine) Sanda Klein, MD as Consulting Physician (Cardiology) Martinique, Amy, MD as Consulting Physician (Dermatology)  Indicate any recent Medical Services you may have received from other than [facility] providers in the past year (date may be approximate).     Assessment:   This is a routine wellness examination for [facility].  Hearing/Vision screen  Hearing Screening   125Hz   250Hz  500Hz  1000Hz  2000Hz  3000Hz  4000Hz  6000Hz  8000Hz   Right ear:           Left ear:           Comments: No Hearing Concerns. Patient doesn't wear hearing aids.  Vision Screening Comments: Some vision concerns. Patient states she needs her eyes checks. Patient states several years since last eye exam.  Dietary issues and exercise activities discussed: Current Exercise Habits: Home exercise routine, Type of exercise: walking, Time (Minutes): 10, Frequency (Times/Week): 5, Weekly Exercise (Minutes/Week): 50, Intensity: Mild, Exercise limited by: respiratory conditions(s)  Goals    . Increase water intake     Starting 03/02/16, I will attempt to drink 3 bottles of water per day, instead of 1-2 bottles.    . Weight (lb) < 160 lb (72.6 kg)     Pt will walk more to lose weight      Depression Screen PHQ 2/9 Scores 05/27/2020 01/19/2020 01/15/2020 05/26/2019 03/20/2019 05/20/2018 10/22/2017  PHQ - 2 Score 0 0 0 0 0 0 0  Exception Documentation - - - - - - -    Fall Risk Fall Risk  05/27/2020 01/19/2020 01/15/2020 09/15/2019 05/26/2019  Falls in the past year? 1 0 0 0 0  Number falls in past yr: 0 0 0 0 0  Injury with Fall? 0 0 0 0 0  Comment - - - - -  Risk for fall due to : - - - - -  Follow up - - - - -  Comment - - - - -    FALL RISK PREVENTION PERTAINING TO THE HOME:  Any stairs in or around the home? Yes  If so, are there any without handrails? No  Home free of loose throw rugs in walkways, pet beds, electrical cords, etc? No  Adequate lighting in your home to reduce risk of falls? No   ASSISTIVE DEVICES UTILIZED TO PREVENT FALLS:  Life alert? No  Use of a cane, walker or w/c? Yes  Grab bars in the bathroom? Yes  Shower  chair or bench in shower? No  Elevated toilet seat or a handicapped toilet? No   TIMED UP AND GO:  Was the test performed? No .  Length of time to ambulate 10 feet: N/A sec.   Gait slow and steady with assistive device  Cognitive Function: MMSE - Mini Mental  State Exam 05/16/2017 03/02/2016 02/19/2015 09/25/2013  Orientation to time 5 5 5 5   Orientation to Place 5 5 5 5   Registration 3 3 3 3   Attention/ Calculation 5 5 5 5   Recall 1 3 3 2   Language- name 2 objects 2 2 2 2   Language- repeat 1 1 1 1   Language- follow 3 step command 3 3 3 3   Language- read & follow direction 1 1 1 1   Write a sentence 1 1 1 1   Copy design 1 - 1 1  Total score 28 - 30 29     6CIT Screen 05/27/2020 05/26/2019  What Year? 0 points 0 points  What month? 0 points 0 points  What time? 0 points 0 points  Count back from 20 0 points 0 points  Months in reverse 0 points 0 points  Repeat phrase 2 points 0 points  Total Score 2 0    Immunizations Immunization History  Administered Date(s) Administered  . Fluad Quad(high Dose 65+) 03/20/2019  . Influenza, High Dose Seasonal PF 02/08/2017, 04/04/2018  . Influenza,inj,Quad PF,6+ Mos 03/14/2013, 03/26/2014, 02/19/2015, 03/02/2016  . PFIZER SARS-COV-2 Vaccination 07/21/2019, 08/13/2019  . Pneumococcal Conjugate-13 08/02/2013, 08/03/2014  . Pneumococcal Polysaccharide-23 05/22/2009  . Td 10/22/2017  . Zoster 09/25/2013  . Zoster Recombinat (Shingrix) 05/20/2018, 11/06/2018    TDAP status: Up to date  Flu Vaccine status: Due, Education has been provided regarding the importance of this vaccine. Advised may receive this vaccine at local pharmacy or Health Dept. Aware to provide a copy of the vaccination record if obtained from local pharmacy or Health Dept. Verbalized acceptance and understanding.  Pneumococcal vaccine status: Up to date  Covid-19 vaccine status: Completed vaccines  Qualifies for Shingles Vaccine? Yes   Zostavax completed Yes   Shingrix Completed?: Yes  Screening Tests Health Maintenance  Topic Date Due  . INFLUENZA VACCINE  12/21/2019  . COVID-19 Vaccine (3 - Booster for Pfizer series) 02/13/2020  . TETANUS/TDAP  10/23/2027  . DEXA SCAN  Completed  . PNA vac Low Risk Adult  Completed     Health Maintenance  Health Maintenance Due  Topic Date Due  . INFLUENZA VACCINE  12/21/2019  . COVID-19 Vaccine (3 - Booster for Pfizer series) 02/13/2020    Colorectal cancer screening: No longer required.   Mammogram status: No longer required due to Advance age .  Bone Density status: Completed 03/08/2018 . Results reflect: Bone density results: OSTEOPOROSIS. Repeat every 2 years.  Lung Cancer Screening: (Low Dose CT Chest recommended if Age 34-80 years, 30 pack-year currently smoking OR have quit w/in 15years.) does not qualify.   Lung Cancer Screening Referral: No   Additional Screening:  Hepatitis C Screening: does qualify; Completed yes  Vision Screening: Recommended annual ophthalmology exams for early detection of glaucoma and other disorders of the eye. Is the patient up to date with their annual eye exam?  No  Who is the provider or what is the name of the office in which the patient attends annual eye exams? Dr.Tanner  If pt is not established with a provider, would they like to be referred to a provider to establish care? No .  Dental Screening: Recommended annual dental exams for proper oral hygiene  Community Resource Referral / Chronic Care Management: CRR required this visit?  No   CCM required this visit?  No      Plan:   - Influenza vaccine  - Advised to get her COVID-19 booster vaccine  I have personally reviewed and noted the following in the patient's chart:   . Medical and social history . Use of alcohol, tobacco or illicit drugs  . Current medications and supplements . Functional ability and status . Nutritional status . Physical activity . Advanced directives . List of other physicians . Hospitalizations, surgeries, and ER visits in previous 12 months . Vitals . Screenings to include cognitive, depression, and falls . Referrals and appointments  In addition, I have reviewed and discussed with patient certain preventive protocols,  quality metrics, and best practice recommendations. A written personalized care plan for preventive services as well as general preventive health recommendations were provided to patient.     Sandrea Hughs, NP   05/27/2020   Nurse Notes: Has upcoming appointment with Dr.Reed 06/02/2019 would like to get her Influenza vaccine during visit.

## 2020-06-03 ENCOUNTER — Other Ambulatory Visit: Payer: Self-pay

## 2020-06-03 ENCOUNTER — Encounter: Payer: Self-pay | Admitting: Internal Medicine

## 2020-06-03 ENCOUNTER — Ambulatory Visit (INDEPENDENT_AMBULATORY_CARE_PROVIDER_SITE_OTHER): Payer: Medicare HMO | Admitting: Internal Medicine

## 2020-06-03 VITALS — BP 112/70 | HR 83 | Temp 96.9°F | Ht 64.0 in | Wt 150.6 lb

## 2020-06-03 DIAGNOSIS — E039 Hypothyroidism, unspecified: Secondary | ICD-10-CM

## 2020-06-03 DIAGNOSIS — I10 Essential (primary) hypertension: Secondary | ICD-10-CM

## 2020-06-03 DIAGNOSIS — I4819 Other persistent atrial fibrillation: Secondary | ICD-10-CM | POA: Diagnosis not present

## 2020-06-03 DIAGNOSIS — I509 Heart failure, unspecified: Secondary | ICD-10-CM

## 2020-06-03 DIAGNOSIS — I872 Venous insufficiency (chronic) (peripheral): Secondary | ICD-10-CM | POA: Diagnosis not present

## 2020-06-03 DIAGNOSIS — Z23 Encounter for immunization: Secondary | ICD-10-CM

## 2020-06-03 DIAGNOSIS — N1831 Chronic kidney disease, stage 3a: Secondary | ICD-10-CM

## 2020-06-03 NOTE — Progress Notes (Signed)
Location:  [facility] clinic Provider:  Kevork Joyce L. Mariea Clonts, D.O., C.M.D.  Goals of Care:  Advanced Directives 06/03/2020  Does Patient Have a Medical Advance Directive? Yes  Type of Paramedic of [place];Living will  Does patient want to make changes to medical advance directive? No - Patient declined  Copy of [place] in Chart? No - copy requested  Would patient like information on creating a medical advance directive? -  Has a living will with DNR she says.  We don't have a copy.   Chief Complaint  Patient presents with  . Medical Management of Chronic Issues    4 month follow-up. Flu vaccine today. Patient is planning on getting covid booster in the near future.     HPI: Patient is a 80 y.o. female seen today for medical management of chronic diseases.    Had a headache earlier but it's better.    Afib:  Thinks her heart is doing ok.  She does get out of breath at times.  Ankles were more swollen yesterday and have not gone all the way back down.  She is on the lasix daily.  Takes at night and gets up several times.  She feels like it works better when she takes it at night.  She is able to get back to sleep.    Blood pressure is good.    No recent wheezing.  Has slight uphill coming back from mailbox at times.  Not daily.    No aches or pains at this time.  Mood has been fine.  Things going ok with family.  Grandson and his family have covid.  Granddaughter has to stay home b/c someone at daycare had covid.  She's not got it though.    Getting flu shot today.  Past Medical History:  Diagnosis Date  . Anxiety   . Atrial fibrillation with rapid ventricular response ([place]) 07/07/2015  . Bilateral breast cancer ([place])   . Daily headache   . Depression   . GERD (gastroesophageal reflux disease)   . Hyperlipidemia   . Hypertension   . Hypothyroidism   . Iron deficiency anemia 2000s  . Lumbago   . Nausea alone   . Senile osteoporosis    . Squamous cell carcinoma of forehead    "I think they burned it off"    Past Surgical History:  Procedure Laterality Date  . ABDOMINAL HYSTERECTOMY  1970s  . BREAST BIOPSY Bilateral   . BREAST LUMPECTOMY Right 2002   "w/lymph nodes removed"  . CARDIOVERSION N/A 08/19/2015   Procedure: CARDIOVERSION;  Surgeon: Sanda Klein, MD;  Location: [place];  Service: Cardiovascular;  Laterality: N/A;  . CATARACT EXTRACTION W/ INTRAOCULAR LENS  IMPLANT, BILATERAL Bilateral 2000s  . DILATION AND CURETTAGE OF UTERUS  ~ 1968  . ELBOW FRACTURE SURGERY Left 2011   "has 2 screws in it"  . EXCISIONAL HEMORRHOIDECTOMY  ~ 1970  . FRACTURE SURGERY  02/12/2016   Right Wrist  . HEMIARTHROPLASTY HIP Left 2011   "had ball replaced"  . INGUINAL HERNIA REPAIR Right 1980s?  [name] MASTECTOMY Left 2005  . ORIF WRIST FRACTURE Left 02/12/2016   Procedure: OPEN REDUCTION INTERNAL FIXATION LEFT DISTAL RADIUS;  Surgeon: Dayna Barker, MD;  Location: [place];  Service: Plastics;  Laterality: Left;  . TOTAL THYROIDECTOMY  1972  . WRIST FRACTURE SURGERY Right    "has a plate and 7 screws in there"    No Known Allergies  Outpatient Encounter  Medications as of 06/03/2020  Medication Sig  . acetaminophen (TYLENOL) 325 MG tablet Take 2 tablets (650 mg total) by mouth every 4 (four) hours as needed for headache or mild pain.  [name] albuterol (VENTOLIN HFA) 108 (90 Base) MCG/ACT inhaler Inhale 1 puff into the lungs 3 (three) times daily as needed for shortness of breath.  . ALPRAZolam (XANAX) 0.5 MG tablet TAKE 1 TABLET BY MOUTH DAILY FOR ANXIETY  . amLODipine (NORVASC) 5 MG tablet TAKE 1 TABLET BY MOUTH DAILY  . Cholecalciferol (VITAMIN D3) 2000 UNITS TABS Take 1 tablet by mouth daily.   . diphenhydrAMINE (BENADRYL) 25 MG tablet Take 25 mg by mouth as needed.  [name] ELIQUIS 5 MG TABS tablet TAKE 1 TABLET BY MOUTH TWICE DAILY  . furosemide (LASIX) 40 MG tablet TAKE 1 TABLET BY MOUTH DAILY  . ibuprofen (ADVIL) 200 MG tablet  Take 200 mg by mouth daily as needed for headache or mild pain.  [name] levothyroxine (SYNTHROID) 150 MCG tablet TAKE 1 TABLET BY MOUTH DAILY 30 MINUTES BEFORE BREAKFAST  . meclizine (ANTIVERT) 12.5 MG tablet Take 12.5 mg by mouth as needed for dizziness (OTC for dizziness).  . metoprolol tartrate (LOPRESSOR) 50 MG tablet TAKE 1 TABLET BY MOUTH TWICE DAILY  . potassium chloride SA (KLOR-CON) 20 MEQ tablet Take 2 tablets (40 mEq total) by mouth daily.  . rosuvastatin (CRESTOR) 10 MG tablet TAKE 1 TABLET BY MOUTH DAILY  . sertraline (ZOLOFT) 25 MG tablet TAKE 2 TABLETS BY MOUTH DAILY  . [DISCONTINUED] alendronate (FOSAMAX) 70 MG tablet Take 1 tablet (70 mg total) by mouth every 7 (seven) days. Take with a full glass of water on an empty stomach.  . [DISCONTINUED] amoxicillin-clavulanate (AUGMENTIN) 875-125 MG tablet Take 1 tablet by mouth 2 (two) times daily.   No facility-administered encounter medications on file as of 06/03/2020.    Review of Systems:  Review of Systems  Constitutional: Positive for malaise/fatigue. Negative for chills and fever.  Eyes: Negative for blurred vision.  Respiratory: Positive for shortness of breath. Negative for cough.   Cardiovascular: Positive for leg swelling. Negative for chest pain, palpitations, orthopnea and PND.  Gastrointestinal: Negative for abdominal pain, blood in stool and melena.  Genitourinary: Negative for dysuria.  Musculoskeletal: Negative for falls and joint pain.  Skin: Negative for itching and rash.  Neurological: Negative for dizziness and loss of consciousness.       Had headache this am but it's gone now  Endo/Heme/Allergies: Bruises/bleeds easily.  Psychiatric/Behavioral: Negative for depression and memory loss. The patient is not nervous/anxious and does not have insomnia.     Health Maintenance  Topic Date Due  . INFLUENZA VACCINE  12/21/2019  . COVID-19 Vaccine (3 - Booster for Pfizer series) 02/13/2020  . TETANUS/TDAP  10/23/2027   . DEXA SCAN  Completed  . PNA vac Low Risk Adult  Completed    Physical Exam: Vitals:   06/03/20 1149  BP: 112/70  Pulse: 83  Temp: (!) 96.9 F (36.1 C)  TempSrc: Temporal  SpO2: 98%  Weight: 150 lb 9.6 oz (68.3 kg)  Height: 5\' 4"  (1.626 m)   Body mass index is 25.85 kg/m. Physical Exam Vitals reviewed.  Constitutional:      Appearance: Normal appearance.  Cardiovascular:     Rate and Rhythm: Rhythm irregular.  Pulmonary:     Effort: Pulmonary effort is normal.     Breath sounds: Normal breath sounds. No rales.  Abdominal:     General: Bowel sounds  are normal. There is no distension.  Musculoskeletal:     Right lower leg: Edema present.     Left lower leg: Edema present.     Comments: Nonpitting and only lower legs  Neurological:     General: No focal deficit present.     Mental Status: She is alert and oriented to person, place, and time.  Psychiatric:        Mood and Affect: Mood normal.     Labs reviewed: Basic Metabolic Panel: Recent Labs    09/15/19 0934 10/22/19 0811 01/15/20 1009 01/19/20 1120  NA 140 141 139 141  K 3.9 3.2* 3.4* 4.5  CL 104 98 97* 103  CO2 26 33* 33* 31  GLUCOSE 99 93 94 101*  BUN 19 16 14 12   CREATININE 1.19* 1.17* 1.24* 1.02*  CALCIUM 9.0 9.0 9.6 9.6  TSH 5.11*  --  0.49  --    Liver Function Tests: Recent Labs    09/15/19 0934  AST 25  ALT 15  BILITOT 0.6  PROT 6.6   No results for input(s): LIPASE, AMYLASE in the last 8760 hours. No results for input(s): AMMONIA in the last 8760 hours. CBC: Recent Labs    09/15/19 0934 01/15/20 1009  WBC 7.8 8.0  NEUTROABS 4,298 4,440  HGB 10.6* 12.3  HCT 35.3 39.6  MCV 81.0 80.0  PLT 170 175   Lipid Panel: Recent Labs    09/15/19 0934  CHOL 99  HDL 35*  LDLCALC 45  TRIG 100  CHOLHDL 2.8   Lab Results  Component Value Date   HGBA1C 6.0 (H) 09/15/2019    Procedures since last visit: No results found.  Assessment/Plan 1. Chronic congestive heart failure,  unspecified heart failure type (HCC) -appears weight is stable and euvolemic, edema appears to be more venous at present - COMPLETE METABOLIC PANEL WITH GFR; Future - CBC with Differential/Platelet; Future  2. Persistent atrial fibrillation (HCC) - rate controlled, cont same regimen and noac - TSH; Future  3. Chronic renal impairment, stage 3a (HCC) -Avoid nephrotoxic agents like nsaids, dose adjust renally excreted meds, hydrate. - COMPLETE METABOLIC PANEL WITH GFR; Future  4. Essential hypertension, benign -bp at goal, cont same regimen and monitor - COMPLETE METABOLIC PANEL WITH GFR; Future  5. Acquired hypothyroidism -cont current levothyroxine and monitor - TSH; Future  6. Chronic venous insufficiency -recommended she use the compression hose she has in the day and elevate feet at rest  7. Need for influenza vaccination - Flu Vaccine QUAD High Dose(Fluad)  Labs/tests ordered:   Orders Placed This Encounter  Procedures  . Flu Vaccine QUAD High Dose(Fluad)  . TSH    Standing Status:   Future    Standing Expiration Date:   03/03/2021    Order Specific Question:   Release to patient    Answer:   Immediate  . COMPLETE METABOLIC PANEL WITH GFR    Standing Status:   Future    Standing Expiration Date:   03/03/2021    Order Specific Question:   Release to patient    Answer:   Immediate  . CBC with Differential/Platelet    Standing Status:   Future    Standing Expiration Date:   03/03/2021    Order Specific Question:   Release to patient    Answer:   Immediate    Next appt:  10/04/2020 med mgt, fasting labs before   Yandell Mcjunkins L. Briahna Pescador, D.O. [facility] [phone]  [address], [place] [address] Cell Phone (Mon-Fri 8am-5pm):  [phone] On Call:  [phone] & follow prompts after 5pm & weekends Office Phone:  [phone] Office Fax:  [phone]

## 2020-06-03 NOTE — Patient Instructions (Addendum)
Please try to remember to bring your living will documents with you next time.    Please wear your compression stockings--put them on in the morning and take off at bedtime.

## 2020-06-23 ENCOUNTER — Other Ambulatory Visit: Payer: Self-pay | Admitting: Internal Medicine

## 2020-06-23 DIAGNOSIS — R0609 Other forms of dyspnea: Secondary | ICD-10-CM

## 2020-06-23 DIAGNOSIS — I509 Heart failure, unspecified: Secondary | ICD-10-CM

## 2020-06-23 DIAGNOSIS — R0600 Dyspnea, unspecified: Secondary | ICD-10-CM

## 2020-06-23 NOTE — Telephone Encounter (Signed)
Patient has request refill on medication "Xanax 0.5mg ", and "Furosemide 40mg ". Last refill on Xanax was 09/26/2019 and Furosemide 40 mg 04/22/2020. Medications pend and sent to PCP Gayland Curry, DO for approval. Please Advise.

## 2020-07-12 ENCOUNTER — Encounter: Payer: Self-pay | Admitting: Internal Medicine

## 2020-07-15 ENCOUNTER — Other Ambulatory Visit: Payer: Self-pay | Admitting: Internal Medicine

## 2020-08-05 ENCOUNTER — Other Ambulatory Visit: Payer: Self-pay | Admitting: Physician Assistant

## 2020-08-05 ENCOUNTER — Other Ambulatory Visit: Payer: Self-pay | Admitting: Internal Medicine

## 2020-08-05 NOTE — Telephone Encounter (Signed)
This is Dr. Croitoru's pt 

## 2020-08-18 ENCOUNTER — Encounter: Payer: Self-pay | Admitting: Cardiovascular Disease

## 2020-08-18 ENCOUNTER — Other Ambulatory Visit: Payer: Self-pay

## 2020-08-18 ENCOUNTER — Ambulatory Visit: Payer: Medicare HMO | Admitting: Cardiovascular Disease

## 2020-08-18 VITALS — BP 110/70 | HR 68 | Ht 64.5 in | Wt 152.4 lb

## 2020-08-18 DIAGNOSIS — I10 Essential (primary) hypertension: Secondary | ICD-10-CM | POA: Diagnosis not present

## 2020-08-18 DIAGNOSIS — Z7901 Long term (current) use of anticoagulants: Secondary | ICD-10-CM

## 2020-08-18 DIAGNOSIS — R600 Localized edema: Secondary | ICD-10-CM

## 2020-08-18 DIAGNOSIS — I4819 Other persistent atrial fibrillation: Secondary | ICD-10-CM | POA: Diagnosis not present

## 2020-08-18 DIAGNOSIS — E7800 Pure hypercholesterolemia, unspecified: Secondary | ICD-10-CM

## 2020-08-18 MED ORDER — AMLODIPINE BESYLATE 2.5 MG PO TABS: 2.5000 mg | ORAL_TABLET | Freq: Every day | ORAL | 3 refills | Status: DC

## 2020-08-18 NOTE — Patient Instructions (Signed)
Medication Instructions:  DECREASE the Amlodipine to 2.5 mg once daily  *If you need a refill on your cardiac medications before your next appointment, please call your pharmacy*   Lab Work: None ordered If you have labs (blood work) drawn today and your tests are completely normal, you will receive your results only by: [name] MyChart Message (if you have MyChart) OR . A paper copy in the mail If you have any lab test that is abnormal or we need to change your treatment, we will call you to review the results.   Testing/Procedures: None ordered   Follow-Up: At [facility], you and your health needs are our priority.  As part of our continuing mission to provide you with exceptional heart care, we have created designated Provider Care Teams.  These Care Teams include your primary Cardiologist (physician) and Advanced Practice Providers (APPs -  Physician Assistants and Nurse Practitioners) who all work together to provide you with the care you need, when you need it.  We recommend signing up for the patient portal called "MyChart".  Sign up information is provided on this After Visit Summary.  MyChart is used to connect with patients for Virtual Visits (Telemedicine).  Patients are able to view lab/test results, encounter notes, upcoming appointments, etc.  Non-urgent messages can be sent to your provider as well.   To learn more about what you can do with MyChart, go to [email or web address].    Your next appointment:   12 month(s)  The format for your next appointment:   In Person  Provider:   You may see Sanda Klein, MD or one of the following Advanced Practice Providers on your designated Care Team:    Almyra Deforest, PA-C  Fabian Sharp, PA-C or   Roby Lofts, [place]

## 2020-08-18 NOTE — Progress Notes (Signed)
Patient ID: Deborah Randall, female   DOB: Dec 01, 1940, 80 y.o.   MRN: [id number]    Cardiology Office Note    Date:  08/19/2020   ID:  Deborah Randall, DOB [birth year], MRN [id number]  PCP:  Sandrea Hughs, NP  Cardiologist:  Sanda Klein, MD   Chief Complaint  Patient presents with  . Edema    History of Present Illness:  Deborah Randall is a 80 y.o. female with recurrent but infrequent paroxysmal atrial fibrillation with rapid ventricular response, initially diagnosed in 2011, s/p previous cardioversion (August 19, 2015).   She complains of ankle swelling that does not go away completely by the following morning, even after overnight rest in bed.  However she does not have any problems with shortness of breath either at rest or with activity and definitely does not have orthopnea or PND.  Her blood pressure has been very well controlled typically around 110/70.  She takes amlodipine and metoprolol.  She is on chronic anticoagulation with Eliquis for atrial fibrillation.  As was the case at her last appointment.  She presents today in atrial fibrillation with controlled ventricular response and is completely oblivious to the arrhythmia.  The patient specifically denies any chest pain at rest exertion, dyspnea at rest or with exertion, orthopnea, paroxysmal nocturnal dyspnea, syncope, palpitations, focal neurological deficits, intermittent claudication, unexplained weight gain, cough, hemoptysis or wheezing.  She continues to live alone and takes care of her own housework although she does have some help.  Her son and brother live next-door.  She has well treated hypertension and hypothyroidism and hypercholesterolemia. She has a remote history of left mastectomy and radiation therapy 16 years ago. She has preserved left ventricular systolic function and a moderately dilated left atrium.   Past Medical History:  Diagnosis Date  . Anxiety   . Atrial fibrillation with rapid ventricular response  ([place]) 07/07/2015  . Bilateral breast cancer ([place])   . Daily headache   . Depression   . GERD (gastroesophageal reflux disease)   . Hyperlipidemia   . Hypertension   . Hypothyroidism   . Iron deficiency anemia 2000s  . Lumbago   . Nausea alone   . Senile osteoporosis   . Squamous cell carcinoma of forehead    "I think they burned it off"    Past Surgical History:  Procedure Laterality Date  . ABDOMINAL HYSTERECTOMY  1970s  . BREAST BIOPSY Bilateral   . BREAST LUMPECTOMY Right 2002   "w/lymph nodes removed"  . CARDIOVERSION N/A 08/19/2015   Procedure: CARDIOVERSION;  Surgeon: Sanda Klein, MD;  Location: [place];  Service: Cardiovascular;  Laterality: N/A;  . CATARACT EXTRACTION W/ INTRAOCULAR LENS  IMPLANT, BILATERAL Bilateral 2000s  . DILATION AND CURETTAGE OF UTERUS  ~ 1968  . ELBOW FRACTURE SURGERY Left 2011   "has 2 screws in it"  . EXCISIONAL HEMORRHOIDECTOMY  ~ 1970  . FRACTURE SURGERY  02/12/2016   Right Wrist  . HEMIARTHROPLASTY HIP Left 2011   "had ball replaced"  . INGUINAL HERNIA REPAIR Right 1980s?  [name] MASTECTOMY Left 2005  . ORIF WRIST FRACTURE Left 02/12/2016   Procedure: OPEN REDUCTION INTERNAL FIXATION LEFT DISTAL RADIUS;  Surgeon: Dayna Barker, MD;  Location: [place];  Service: Plastics;  Laterality: Left;  . TOTAL THYROIDECTOMY  1972  . WRIST FRACTURE SURGERY Right    "has a plate and 7 screws in there"    Current Medications: Outpatient Medications Prior to Visit  Medication Sig Dispense Refill  .  acetaminophen (TYLENOL) 325 MG tablet Take 2 tablets (650 mg total) by mouth every 4 (four) hours as needed for headache or mild pain.    [name] albuterol (VENTOLIN HFA) 108 (90 Base) MCG/ACT inhaler Inhale 1 puff into the lungs 3 (three) times daily as needed for shortness of breath.    . ALPRAZolam (XANAX) 0.5 MG tablet TAKE 1 TABLET BY MOUTH DAILY FOR ANXIETY 30 tablet 0  . Cholecalciferol (VITAMIN D3) 2000 UNITS TABS Take 1 tablet by mouth daily.     [name]  ELIQUIS 5 MG TABS tablet TAKE 1 TABLET BY MOUTH TWICE DAILY 180 tablet 1  . furosemide (LASIX) 40 MG tablet TAKE 1 TABLET BY MOUTH DAILY 30 tablet 1  . ibuprofen (ADVIL) 200 MG tablet Take 200 mg by mouth daily as needed for headache or mild pain.    [name] levothyroxine (SYNTHROID) 150 MCG tablet TAKE 1 TABLET BY MOUTH DAILY 30 MINUTES BEFORE BREAKFAST 90 tablet 3  . meclizine (ANTIVERT) 12.5 MG tablet Take 12.5 mg by mouth as needed for dizziness (OTC for dizziness).    . metoprolol tartrate (LOPRESSOR) 50 MG tablet TAKE 1 TABLET BY MOUTH TWICE DAILY 180 tablet 2  . potassium chloride SA (KLOR-CON) 20 MEQ tablet Take 2 tablets (40 mEq total) by mouth daily. 90 tablet 1  . rosuvastatin (CRESTOR) 10 MG tablet TAKE 1 TABLET BY MOUTH DAILY 90 tablet 1  . sertraline (ZOLOFT) 25 MG tablet TAKE 2 TABLETS BY MOUTH DAILY 60 tablet 5  . amLODipine (NORVASC) 5 MG tablet TAKE 1 TABLET BY MOUTH DAILY 30 tablet 0   No facility-administered medications prior to visit.     Allergies:   Patient has no known allergies.   Social History   Socioeconomic History  . Marital status: Widowed    Spouse name: Not on file  . Number of children: Not on file  . Years of education: Not on file  . Highest education level: Not on file  Occupational History  . 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 Smoking status: Never Smoker  . Smokeless tobacco: Never Used  Vaping Use  . Vaping Use: Never used  Substance and Sexual Activity  . Alcohol use: No  . Drug use: No  . Sexual activity: Never  Other Topics Concern  . Not on file  Social History Narrative   Lives alone   Widow   2 sons , one son decease, one son disable    Works at [employer] in [employer]   Enjoys going places with friends   Never smoked   Alcohol none   Exercise -walk, works in yard    Social Determinants of Radio broadcast assistant Strain: Not on [employer] Insecurity: Not on file  Transportation Needs:  Not on file  Physical Activity: Not on file  Stress: Not on file  Social Connections: Not on file     Family History:  The patient's family history includes Alzheimer's disease in her brother; COPD in her brother; Cancer in her father and sister; Diabetes in her brother; Heart attack in her brother; Heart disease in her sister; Hypertension in her sister; Mental illness in her son and son; Other in her brother, brother, and brother.   ROS:   Please see the history of present illness.    ROS All other systems are reviewed and are negative.   PHYSICAL EXAM:   VS:  BP 110/70   Pulse 68  Ht 5' 4.5" (1.638 m)   Wt 152 lb 6.4 oz (69.1 kg)   BMI 25.76 kg/m     General: Alert, oriented x3, no distress, appears well Head: no evidence of trauma, PERRL, EOMI, no exophtalmos or lid lag, no myxedema, no xanthelasma; normal ears, nose and oropharynx Neck: normal jugular venous pulsations and no hepatojugular reflux; brisk carotid pulses without delay and no carotid bruits Chest: clear to auscultation, no signs of consolidation by percussion or palpation, normal fremitus, symmetrical and full respiratory excursions Cardiovascular: normal position and quality of the apical impulse, regular rhythm, normal first and second heart sounds, no murmurs, rubs or gallops Abdomen: no tenderness or distention, no masses by palpation, no abnormal pulsatility or arterial bruits, normal bowel sounds, no hepatosplenomegaly Extremities: no clubbing, cyanosis;, symmetrical 2+ pedal and ankle edema; 2+ radial, ulnar and brachial pulses bilaterally; 2+ right femoral, posterior tibial and dorsalis pedis pulses; 2+ left femoral, posterior tibial and dorsalis pedis pulses; no subclavian or femoral bruits Neurological: grossly nonfocal Psych: Normal mood and affect   Wt Readings from Last 3 Encounters:  08/18/20 152 lb 6.4 oz (69.1 kg)  06/03/20 150 lb 9.6 oz (68.3 kg)  01/19/20 149 lb 9.6 oz (67.9 kg)       Studies/Labs Reviewed:   EKG:  EKG is ordered today.  It shows atrial fibrillation with controlled ventricular rate and minor nonspecific ST segment changes, QTC 433 ms.  Recent Labs: 09/15/2019: ALT 15; Brain Natriuretic Peptide 356 01/15/2020: Hemoglobin 12.3; Platelets 175; TSH 0.49 01/19/2020: BUN 12; Creat 1.02; Potassium 4.5; Sodium 141   Lipid Panel    Component Value Date/Time   CHOL 99 09/15/2019 0934   CHOL 155 11/22/2015 0858   TRIG 100 09/15/2019 0934   HDL 35 (L) 09/15/2019 0934   HDL 46 11/22/2015 0858   CHOLHDL 2.8 09/15/2019 0934   VLDL 17 07/08/2015 0535   LDLCALC 45 09/15/2019 0934    ASSESSMENT:    1. Persistent atrial fibrillation ([place])   2. Long term (current) use of anticoagulants   3. Essential hypertension, benign   4. Hypercholesterolemia   5. Bilateral leg edema      PLAN:  In order of problems listed above:  1. AFib: Well rate controlled, on anticoagulation, asymptomatic.  May have settled into a pattern of longstanding persistent atrial fibrillation.  Cardioversion and antiarrhythmics do not appear to be indicated.  (CHADSVasc score 4: age 6, gender, HTN). 2. Anticoagulation:  denies bleeding complications. 3. HTN: Reduce the dose of amlodipine to 2.5 mg daily and consider stopping it altogether if her blood pressure remains controlled and the edema persists. 4. HLP: LDL in target range.  Continue rosuvastatin. 5. Edema: She does not have any other features to suggest congestive heart failure.  Her weight is unchanged compared to previous visits.  May simply be a side effect of amlodipine.  Cut back the dose.    Medication Adjustments/Labs and Tests Ordered: Current medicines are reviewed at length with the patient today.  Concerns regarding medicines are outlined above.  Medication changes, Labs and Tests ordered today are listed in the Patient Instructions below. Patient Instructions  Medication Instructions:  DECREASE the Amlodipine to  2.5 mg once daily  *If you need a refill on your cardiac medications before your next appointment, please call your pharmacy*   Lab Work: None ordered If you have labs (blood work) drawn today and your tests are completely normal, you will receive your results only by: [name] MyChart Message (  if you have MyChart) OR . A paper copy in the mail If you have any lab test that is abnormal or we need to change your treatment, we will call you to review the results.   Testing/Procedures: None ordered   Follow-Up: At [facility], you and your health needs are our priority.  As part of our continuing mission to provide you with exceptional heart care, we have created designated Provider Care Teams.  These Care Teams include your primary Cardiologist (physician) and Advanced Practice Providers (APPs -  Physician Assistants and Nurse Practitioners) who all work together to provide you with the care you need, when you need it.  We recommend signing up for the patient portal called "MyChart".  Sign up information is provided on this After Visit Summary.  MyChart is used to connect with patients for Virtual Visits (Telemedicine).  Patients are able to view lab/test results, encounter notes, upcoming appointments, etc.  Non-urgent messages can be sent to your provider as well.   To learn more about what you can do with MyChart, go to [email or web address].    Your next appointment:   12 month(s)  The format for your next appointment:   In Person  Provider:   You may see Sanda Klein, MD or one of the following Advanced Practice Providers on your designated Care Team:    Almyra Deforest, PA-C  Fabian Sharp, [place] or   Roby Lofts, PA-C     Signed, Sanda Klein, MD  08/19/2020 5:24 PM    [facility] [address], [place], [place]  [address] Phone: [phone]; Fax: [phone]

## 2020-08-19 ENCOUNTER — Encounter: Payer: Self-pay | Admitting: Cardiovascular Disease

## 2020-08-20 ENCOUNTER — Other Ambulatory Visit: Payer: Self-pay | Admitting: Internal Medicine

## 2020-08-20 DIAGNOSIS — I509 Heart failure, unspecified: Secondary | ICD-10-CM

## 2020-08-20 DIAGNOSIS — R0600 Dyspnea, unspecified: Secondary | ICD-10-CM

## 2020-08-20 DIAGNOSIS — R0609 Other forms of dyspnea: Secondary | ICD-10-CM

## 2020-09-16 ENCOUNTER — Telehealth: Payer: Self-pay | Admitting: Cardiovascular Disease

## 2020-09-16 ENCOUNTER — Other Ambulatory Visit: Payer: Self-pay

## 2020-09-16 MED ORDER — METOPROLOL TARTRATE 50 MG PO TABS: 50.0000 mg | ORAL_TABLET | Freq: Two times a day (BID) | ORAL | 3 refills | Status: DC

## 2020-09-16 NOTE — Telephone Encounter (Signed)
*  STAT* If patient is at the pharmacy, call can be transferred to refill team.   1. Which medications need to be refilled? (please list name of each medication and dose if known) metoprolol tartrate (LOPRESSOR) 50 MG tablet  2. Which pharmacy/location (including street and city if local pharmacy) is medication to be sent to? [facility] [place].  3. Do they need a 30 day or 90 day supply? 90 day supply

## 2020-09-24 ENCOUNTER — Other Ambulatory Visit: Payer: Medicare HMO

## 2020-09-24 ENCOUNTER — Other Ambulatory Visit: Payer: Self-pay

## 2020-09-24 DIAGNOSIS — H8112 Benign paroxysmal vertigo, left ear: Secondary | ICD-10-CM

## 2020-09-24 DIAGNOSIS — E039 Hypothyroidism, unspecified: Secondary | ICD-10-CM

## 2020-09-24 DIAGNOSIS — N1831 Chronic kidney disease, stage 3a: Secondary | ICD-10-CM

## 2020-09-25 LAB — CBC WITH DIFFERENTIAL/PLATELET
Absolute Monocytes: 626 cells/uL (ref 200–950)
Basophils Absolute: 81 cells/uL (ref 0–200)
Basophils Relative: 1.4 %
Eosinophils Absolute: 151 cells/uL (ref 15–500)
Eosinophils Relative: 2.6 %
HCT: 36.4 % (ref 35.0–45.0)
Hemoglobin: 11.1 g/dL — ABNORMAL LOW (ref 11.7–15.5)
Lymphs Abs: 1665 cells/uL (ref 850–3900)
MCH: 25.9 pg — ABNORMAL LOW (ref 27.0–33.0)
MCHC: 30.5 g/dL — ABNORMAL LOW (ref 32.0–36.0)
MCV: 84.8 fL (ref 80.0–100.0)
MPV: 10.4 fL (ref 7.5–12.5)
Monocytes Relative: 10.8 %
Neutro Abs: 3277 cells/uL (ref 1500–7800)
Neutrophils Relative %: 56.5 %
Platelets: 152 10*3/uL (ref 140–400)
RBC: 4.29 10*6/uL (ref 3.80–5.10)
RDW: 14.1 % (ref 11.0–15.0)
Total Lymphocyte: 28.7 %
WBC: 5.8 10*3/uL (ref 3.8–10.8)

## 2020-09-25 LAB — COMPLETE METABOLIC PANEL WITH GFR
AG Ratio: 1.4 (calc) (ref 1.0–2.5)
ALT: 15 U/L (ref 6–29)
AST: 23 U/L (ref 10–35)
Albumin: 3.8 g/dL (ref 3.6–5.1)
Alkaline phosphatase (APISO): 86 U/L (ref 37–153)
BUN/Creatinine Ratio: 15 (calc) (ref 6–22)
BUN: 19 mg/dL (ref 7–25)
CO2: 31 mmol/L (ref 20–32)
Calcium: 9 mg/dL (ref 8.6–10.4)
Chloride: 101 mmol/L (ref 98–110)
Creat: 1.28 mg/dL — ABNORMAL HIGH (ref 0.60–0.88)
GFR, Est African American: 46 mL/min/{1.73_m2} — ABNORMAL LOW (ref >=60)
GFR, Est Non African American: 39 mL/min/{1.73_m2} — ABNORMAL LOW (ref >=60)
Globulin: 2.8 g/dL (calc) (ref 1.9–3.7)
Glucose, Bld: 90 mg/dL (ref 65–99)
Potassium: 3.5 mmol/L (ref 3.5–5.3)
Sodium: 142 mmol/L (ref 135–146)
Total Bilirubin: 0.8 mg/dL (ref 0.2–1.2)
Total Protein: 6.6 g/dL (ref 6.1–8.1)

## 2020-09-25 LAB — TSH: TSH: 0.21 mIU/L — ABNORMAL LOW (ref 0.40–4.50)

## 2020-09-27 ENCOUNTER — Encounter: Payer: Self-pay | Admitting: Family

## 2020-09-27 ENCOUNTER — Other Ambulatory Visit: Payer: Self-pay

## 2020-09-27 ENCOUNTER — Other Ambulatory Visit: Payer: Medicare HMO

## 2020-09-27 ENCOUNTER — Other Ambulatory Visit: Payer: Self-pay | Admitting: Physician Assistant

## 2020-09-27 ENCOUNTER — Ambulatory Visit (INDEPENDENT_AMBULATORY_CARE_PROVIDER_SITE_OTHER): Payer: Medicare HMO | Admitting: Family

## 2020-09-27 VITALS — BP 114/70 | HR 75 | Temp 96.8°F | Resp 18 | Ht 64.5 in | Wt 148.8 lb

## 2020-09-27 DIAGNOSIS — I509 Heart failure, unspecified: Secondary | ICD-10-CM

## 2020-09-27 DIAGNOSIS — F419 Anxiety disorder, unspecified: Secondary | ICD-10-CM | POA: Diagnosis not present

## 2020-09-27 DIAGNOSIS — I10 Essential (primary) hypertension: Secondary | ICD-10-CM

## 2020-09-27 DIAGNOSIS — I872 Venous insufficiency (chronic) (peripheral): Secondary | ICD-10-CM

## 2020-09-27 DIAGNOSIS — E7800 Pure hypercholesterolemia, unspecified: Secondary | ICD-10-CM | POA: Diagnosis not present

## 2020-09-27 DIAGNOSIS — I4819 Other persistent atrial fibrillation: Secondary | ICD-10-CM

## 2020-09-27 DIAGNOSIS — E038 Other specified hypothyroidism: Secondary | ICD-10-CM | POA: Diagnosis not present

## 2020-09-27 DIAGNOSIS — F3341 Major depressive disorder, recurrent, in partial remission: Secondary | ICD-10-CM

## 2020-09-27 MED ORDER — LEVOTHYROXINE SODIUM 137 MCG PO TABS: ORAL_TABLET | ORAL | 1 refills | Status: DC

## 2020-09-27 MED ORDER — ALPRAZOLAM 0.5 MG PO TABS: ORAL_TABLET | ORAL | 0 refills | Status: DC

## 2020-09-27 NOTE — Progress Notes (Signed)
Provider: Marlowe Sax FNP-C   Tykia Mellone, Nelda Bucks, NP  Patient Care Team: Colandra Ohanian, Nelda Bucks, NP as PCP - General (Family Medicine) Croitoru, Dani Gobble, MD as Consulting Physician (Cardiology) Martinique, Amy, MD as Consulting Physician (Dermatology)  Extended Emergency Contact Information Primary Emergency Contact: [place] of [place] Phone: [phone] Relation: Granddaughter Secondary Emergency Contact: [place]un[place],Jerry "Daphane Shepherd of [place] Phone: [phone] Mobile Phone: [phone] Relation: Brother  Code Status: Full Code  Goals of care: Advanced Directive information Advanced Directives 09/27/2020  Does Patient Have a Medical Advance Directive? Yes  Type of Advance Directive Living will  Does patient want to make changes to medical advance directive? No - Patient declined  Copy of [place] in Chart? -  Would patient like information on creating a medical advance directive? -     Chief Complaint  Patient presents with  . Medical Management of Chronic Issues    4 month follow up.     HPI:  Pt is a 80 y.o. female seen today for 4 month follow up for medical management of chronic diseases.she has a medical history of Hypertension,Hyperlipidemia,Hypothyroidism,AFib,depression,Generalized anxiety,Anemia among other conditions. She denies any acute issues today.  She does not check blood pressure at home.denies any headache,dizziness,vision changes,fatigue,chest tightness,palpitation,chest pain or shortness of breath.    Her weight is down 4 lbs since last visit two months ago.States her weight at home ranges in the 146's - 150's.does not think she has lost weight.Describes appetite as good.  Has had no fall episode.  Recent lab work results reviewed and discussed with patient.    Past Medical History:  Diagnosis Date  . Anxiety   . Atrial fibrillation with rapid ventricular response ([place]) 07/07/2015  . Bilateral  breast cancer ([place])   . Daily headache   . Depression   . GERD (gastroesophageal reflux disease)   . Hyperlipidemia   . Hypertension   . Hypothyroidism   . Iron deficiency anemia 2000s  . Lumbago   . Nausea alone   . Senile osteoporosis   . Squamous cell carcinoma of forehead    "I think they burned it off"   Past Surgical History:  Procedure Laterality Date  . ABDOMINAL HYSTERECTOMY  1970s  . BREAST BIOPSY Bilateral   . BREAST LUMPECTOMY Right 2002   "w/lymph nodes removed"  . CARDIOVERSION N/A 08/19/2015   Procedure: CARDIOVERSION;  Surgeon: Sanda Klein, MD;  Location: [facility];  Service: Cardiovascular;  Laterality: N/A;  . CATARACT EXTRACTION W/ INTRAOCULAR LENS  IMPLANT, BILATERAL Bilateral 2000s  . DILATION AND CURETTAGE OF UTERUS  ~ 1968  . ELBOW FRACTURE SURGERY Left 2011   "has 2 screws in it"  . EXCISIONAL HEMORRHOIDECTOMY  ~ 1970  . FRACTURE SURGERY  02/12/2016   Right Wrist  . HEMIARTHROPLASTY HIP Left 2011   "had ball replaced"  . INGUINAL HERNIA REPAIR Right 1980s?  [name] MASTECTOMY Left 2005  . ORIF WRIST FRACTURE Left 02/12/2016   Procedure: OPEN REDUCTION INTERNAL FIXATION LEFT DISTAL RADIUS;  Surgeon: Dayna Barker, MD;  Location: [facility];  Service: Plastics;  Laterality: Left;  . TOTAL THYROIDECTOMY  1972  . WRIST FRACTURE SURGERY Right    "has a plate and 7 screws in there"    No Known Allergies  Allergies as of 09/27/2020   No Known Allergies     Medication List       Accurate as of Sep 27, 2020  9:54 AM. If you have any questions,  ask your nurse or doctor.        acetaminophen 325 MG tablet Commonly known as: TYLENOL Take 2 tablets (650 mg total) by mouth every 4 (four) hours as needed for headache or mild pain.   albuterol 108 (90 Base) MCG/ACT inhaler Commonly known as: VENTOLIN HFA Inhale 1 puff into the lungs 3 (three) times daily as needed for shortness of breath.   ALPRAZolam 0.5 MG tablet Commonly known as: XANAX TAKE 1 TABLET BY  MOUTH DAILY FOR ANXIETY   amLODipine 2.5 MG tablet Commonly known as: NORVASC Take 1 tablet (2.5 mg total) by mouth daily.   Eliquis 5 MG Tabs tablet Generic drug: apixaban TAKE 1 TABLET BY MOUTH TWICE DAILY   furosemide 40 MG tablet Commonly known as: LASIX TAKE 1 TABLET BY MOUTH DAILY   ibuprofen 200 MG tablet Commonly known as: ADVIL Take 200 mg by mouth daily as needed for headache or mild pain.   levothyroxine 150 MCG tablet Commonly known as: SYNTHROID TAKE 1 TABLET BY MOUTH DAILY 30 MINUTES BEFORE BREAKFAST   meclizine 12.5 MG tablet Commonly known as: ANTIVERT Take 12.5 mg by mouth as needed for dizziness (OTC for dizziness).   metoprolol tartrate 50 MG tablet Commonly known as: LOPRESSOR Take 1 tablet (50 mg total) by mouth 2 (two) times daily.   potassium chloride SA 20 MEQ tablet Commonly known as: KLOR-CON Take 2 tablets (40 mEq total) by mouth daily.   rosuvastatin 10 MG tablet Commonly known as: CRESTOR TAKE 1 TABLET BY MOUTH DAILY   sertraline 25 MG tablet Commonly known as: ZOLOFT TAKE 2 TABLETS BY MOUTH DAILY   Vitamin D3 50 MCG (2000 UT) Tabs Take 1 tablet by mouth daily.       Review of Systems  Constitutional: Negative for appetite change, chills, fatigue, fever and unexpected weight change.  HENT: Negative for congestion, dental problem, ear discharge, ear pain, facial swelling, hearing loss, nosebleeds, postnasal drip, rhinorrhea, sinus pressure, sinus pain, sneezing, sore throat, tinnitus and trouble swallowing.   Eyes: Positive for visual disturbance. Negative for pain, discharge, redness and itching.       Wears eye glasses has appointment with ophthalmologist   Respiratory: Negative for cough, chest tightness, shortness of breath and wheezing.   Cardiovascular: Negative for chest pain, palpitations and leg swelling.  Gastrointestinal: Negative for abdominal distention, abdominal pain, blood in stool, constipation, diarrhea, nausea and  vomiting.  Endocrine: Negative for cold intolerance, heat intolerance, polydipsia, polyphagia and polyuria.  Genitourinary: Negative for difficulty urinating, dysuria, flank pain, frequency and urgency.  Musculoskeletal: Negative for arthralgias, back pain, gait problem, joint swelling, myalgias, neck pain and neck stiffness.  Skin: Negative for color change, pallor, rash and wound.  Neurological: Negative for dizziness, syncope, speech difficulty, weakness, light-headedness, numbness and headaches.  Hematological: Does not bruise/bleed easily.  Psychiatric/Behavioral: Negative for agitation, behavioral problems, confusion, hallucinations, self-injury, sleep disturbance and suicidal ideas. The patient is nervous/anxious.     Immunization History  Administered Date(s) Administered  . Fluad Quad(high Dose 65+) 03/20/2019, 06/03/2020  . Influenza, High Dose Seasonal PF 02/08/2017, 04/04/2018  . Influenza,inj,Quad PF,6+ Mos 03/14/2013, 03/26/2014, 02/19/2015, 03/02/2016  . PFIZER(Purple Top)SARS-COV-2 Vaccination 07/21/2019, 08/13/2019, 07/01/2020  . Pneumococcal Conjugate-13 08/02/2013, 08/03/2014  . Pneumococcal Polysaccharide-23 05/22/2009  . Td 10/22/2017  . Zoster 09/25/2013  . Zoster Recombinat (Shingrix) 05/20/2018, 11/06/2018   Pertinent  Health Maintenance Due  Topic Date Due  . INFLUENZA VACCINE  12/20/2020  . DEXA SCAN  Completed  . PNA  vac Low Risk Adult  Completed   Fall Risk  09/27/2020 06/03/2020 05/27/2020 01/19/2020 01/15/2020  Falls in the past year? 1 0 1 0 0  Number falls in past yr: 1 0 0 0 0  Injury with Fall? 0 0 0 0 0  Comment - - - - -  Risk for fall due to : - - - - -  Follow up - - - - -  Comment - - - - -   Functional Status Survey:    Vitals:   09/27/20 0924  BP: 114/70  Pulse: 75  Resp: 18  Temp: (!) 96.8 F (36 C)  TempSrc: Temporal  SpO2: 98%  Weight: 148 lb 12.8 oz (67.5 kg)  Height: 5' 4.5" (1.638 m)   Body mass index is 25.15  kg/m. Physical Exam Vitals reviewed.  Constitutional:      General: She is not in acute distress.    Appearance: Normal appearance. She is normal weight. She is not ill-appearing or diaphoretic.  HENT:     Head: Normocephalic.     Right Ear: Tympanic membrane, ear canal and external ear normal. There is no impacted cerumen.     Left Ear: Tympanic membrane, ear canal and external ear normal. There is no impacted cerumen.     Nose: Nose normal. No congestion or rhinorrhea.     Mouth/Throat:     Mouth: Mucous membranes are moist.     Pharynx: Oropharynx is clear. No oropharyngeal exudate or posterior oropharyngeal erythema.  Eyes:     General: No scleral icterus.       Right eye: No discharge.        Left eye: No discharge.     Extraocular Movements: Extraocular movements intact.     Conjunctiva/sclera: Conjunctivae normal.     Pupils: Pupils are equal, round, and reactive to light.  Neck:     Vascular: No carotid bruit.  Cardiovascular:     Rate and Rhythm: Normal rate and regular rhythm.     Pulses: Normal pulses.     Heart sounds: Normal heart sounds. No murmur heard. No friction rub. No gallop.   Pulmonary:     Effort: Pulmonary effort is normal. No respiratory distress.     Breath sounds: Normal breath sounds. No wheezing, rhonchi or rales.  Chest:     Chest wall: No tenderness.  Abdominal:     General: Bowel sounds are normal. There is no distension.     Palpations: Abdomen is soft. There is no mass.     Tenderness: There is no abdominal tenderness. There is no right CVA tenderness, left CVA tenderness, guarding or rebound.  Musculoskeletal:        General: No swelling or tenderness. Normal range of motion.     Cervical back: Normal range of motion. No rigidity or tenderness.     Right lower leg: No edema.     Left lower leg: No edema.  Lymphadenopathy:     Cervical: No cervical adenopathy.  Skin:    General: Skin is warm and dry.     Coloration: Skin is not pale.      Findings: No bruising, erythema, lesion or rash.  Neurological:     Mental Status: She is alert and oriented to person, place, and time.     Cranial Nerves: No cranial nerve deficit.     Sensory: No sensory deficit.     Motor: No weakness.     Coordination: Coordination normal.  Gait: Gait normal.  Psychiatric:        Mood and Affect: Mood normal.        Speech: Speech normal.        Behavior: Behavior normal.        Thought Content: Thought content normal.        Judgment: Judgment normal.     Labs reviewed: Recent Labs    01/15/20 1009 01/19/20 1120 09/24/20 1155  NA 139 141 142  K 3.4* 4.5 3.5  CL 97* 103 101  CO2 33* 31 31  GLUCOSE 94 101* 90  BUN '14 12 19  ' CREATININE 1.24* 1.02* 1.28*  CALCIUM 9.6 9.6 9.0   Recent Labs    09/24/20 1155  AST 23  ALT 15  BILITOT 0.8  PROT 6.6   Recent Labs    01/15/20 1009 09/24/20 1155  WBC 8.0 5.8  NEUTROABS 4,440 3,277  HGB 12.3 11.1*  HCT 39.6 36.4  MCV 80.0 84.8  PLT 175 152   Lab Results  Component Value Date   TSH 0.21 (L) 09/24/2020   Lab Results  Component Value Date   HGBA1C 6.0 (H) 09/15/2019   Lab Results  Component Value Date   CHOL 99 09/15/2019   HDL 35 (L) 09/15/2019   LDLCALC 45 09/15/2019   TRIG 100 09/15/2019   CHOLHDL 2.8 09/15/2019    Significant Diagnostic Results in last 30 days:  No results found.  Assessment/Plan 1. Other specified hypothyroidism Lab Results  Component Value Date   TSH 0.21 (L) 09/24/2020  Advised to reduce levothyroxine from 150 mcg to 137 mcg tablet daily. - will recheck TSH level in 8 weeks - levothyroxine (SYNTHROID) 137 MCG tablet; TAKE 1 TABLET BY MOUTH DAILY 30 MINUTES BEFORE BREAKFAST  Dispense: 90 tablet; Refill: 1 - TSH; Future  2. Essential hypertension, benign B/p well controlled. - continue on amlodipine,metoprolol and Furosemide  - CBC with Differential/Platelet; Future - CMP with eGFR([facility]); Future - TSH; Future  3. Persistent  atrial fibrillation (HCC) HR controlled  - continue on metoprolol and Eliquis   4. Hypercholesterolemia LDL at goal  - continue on rosuvastatin  - Lipid panel; Future  5. Chronic congestive heart failure, unspecified heart failure type (HCC) No signs of fluid overload  - continue on Furosemide  - advised to check weight at least three times per week and notify provider for any abrupt weight gain.  6. Chronic venous insufficiency Continue on furosemide   7. Recurrent major depressive disorder, in partial remission (HCC) Mood stable. - continue on sertraline - TSH; Future  8. Anxiety Continue on Alprazolam  - ALPRAZolam (XANAX) 0.5 MG tablet; TAKE 1 TABLET BY MOUTH DAILY FOR ANXIETY  Dispense: 30 tablet; Refill: 0  Family/ staff Communication: Reviewed plan of care with patient verbalized understanding.   Labs/tests ordered:  - CBC with Differential/Platelet - CMP with eGFR([facility]) - TSH - Lipid panel  Next Appointment : 6 months for medical management of chronic issues.Fasting Labs prior to visit. TSH level in 8 weeks.    Sandrea Hughs, NP

## 2020-09-27 NOTE — Patient Instructions (Signed)
-   decreased Levothyroxine from 150 mcg tablet to 137 mcg tablet daily on an empty stomach

## 2020-10-04 ENCOUNTER — Ambulatory Visit: Payer: Medicare HMO | Admitting: Family

## 2020-10-04 ENCOUNTER — Ambulatory Visit: Payer: Medicare HMO | Admitting: Internal Medicine

## 2020-10-12 ENCOUNTER — Other Ambulatory Visit: Payer: Self-pay | Admitting: *Deleted

## 2020-10-12 DIAGNOSIS — F3289 Other specified depressive episodes: Secondary | ICD-10-CM

## 2020-10-12 MED ORDER — SERTRALINE HCL 25 MG PO TABS: 50.0000 mg | ORAL_TABLET | Freq: Every day | ORAL | 3 refills | Status: DC

## 2020-10-12 NOTE — Telephone Encounter (Signed)
Received refill Request from pharmacy Pended Rx and sent to [facility] for approval due to [place]. ([place] out of office)

## 2020-11-15 ENCOUNTER — Telehealth (INDEPENDENT_AMBULATORY_CARE_PROVIDER_SITE_OTHER): Payer: Medicare HMO | Admitting: Family

## 2020-11-15 ENCOUNTER — Other Ambulatory Visit: Payer: Self-pay

## 2020-11-15 ENCOUNTER — Encounter: Payer: Self-pay | Admitting: Family

## 2020-11-15 ENCOUNTER — Telehealth: Payer: Self-pay

## 2020-11-15 ENCOUNTER — Ambulatory Visit
Admission: RE | Admit: 2020-11-15 | Discharge: 2020-11-15 | Disposition: A | Discharge status: Home / Self Care | Payer: Medicare HMO | Source: Ambulatory Visit | Attending: Family | Admitting: Family

## 2020-11-15 DIAGNOSIS — Z20822 Contact with and (suspected) exposure to covid-19: Secondary | ICD-10-CM

## 2020-11-15 DIAGNOSIS — M545 Low back pain, unspecified: Secondary | ICD-10-CM

## 2020-11-15 NOTE — Telephone Encounter (Signed)
Deborah Randall, Deborah Randall are scheduled for a virtual visit with your provider today.    Just as we do with appointments in the office, we must obtain your consent to participate.  Your consent will be active for this visit and any virtual visit you may have with one of our providers in the next 365 days.    If you have a MyChart account, I can also send a copy of this consent to you electronically.  All virtual visits are billed to your insurance company just like a traditional visit in the office.  As this is a virtual visit, video technology does not allow for your provider to perform a traditional examination.  This may limit your provider's ability to fully assess your condition.  If your provider identifies any concerns that need to be evaluated in person or the need to arrange testing such as labs, EKG, etc, we will make arrangements to do so.    Although advances in technology are sophisticated, we cannot ensure that it will always work on either your end or our end.  If the connection with a video visit is poor, we may have to switch to a telephone visit.  With either a video or telephone visit, we are not always able to ensure that we have a secure connection.   I need to obtain your verbal consent now.   Are you willing to proceed with your visit today?   Deborah Randall has provided verbal consent on 11/15/2020 for a virtual visit (video or telephone).   [place], [place] 11/15/2020  11:30 AM

## 2020-11-15 NOTE — Progress Notes (Signed)
This service is provided via telemedicine  No vital signs collected/recorded due to the encounter was a telemedicine visit.   Location of patient (ex: home, work): Home.  Patient consents to a telephone visit: Yes.  Location of the provider (ex: office, home): [employer].  Name of any referring provider: Tarshia Kot, Nelda Bucks, NP   Names of all persons participating in the telemedicine service and their role in the encounter: Patient, [name], [place], [place], Webb Silversmith, NP.    Time spent on call: 8 minutes spent on the phone with Medical Assistant.     Provider: Marlowe Sax FNP-C  Daionna Crossland, Nelda Bucks, NP  Patient Care Team: Chauna Osoria, Nelda Bucks, NP as PCP - General (Family Medicine) Croitoru, Dani Gobble, MD as Consulting Physician (Cardiology) Martinique, Amy, MD as Consulting Physician (Dermatology)  Extended Emergency Contact Information Primary Emergency Contact: [place] of [place] Phone: [phone] Relation: Granddaughter Secondary Emergency Contact: [place],Jerry "Daphane Shepherd of [place] Phone: [phone] Mobile Phone: [phone] Relation: Brother  Code Status:  Full Code  Goals of care: Advanced Directive information Advanced Directives 11/15/2020  Does Patient Have a Medical Advance Directive? Yes  Type of Advance Directive Living will  Does patient want to make changes to medical advance directive? No - Patient declined  Copy of [place] in Chart? -  Would patient like information on creating a medical advance directive? -     Chief Complaint  Patient presents with   Acute Visit    Patient complains of back and right side pain. Recently has been exposed to Covid.     HPI:  Pt is a 80 y.o. female seen today for an acute visit for evaluation of lower back and right side pain.states was reaching down to pick her lid for the commode when she twisted her back few days ago.since then she  has had lower back pain and right side pain.Pain is worst with walking.Also Hurts to lie on right side.has taken Aleve 200 mg tablet twice daily.Has also used Roll on gel with some relief. She denies any leg weakness,numbness or tingling,bowel or urine incontinence.  States was in contact with her son one week ago who tested positive for COVID-19. She has had no symptoms.Has had her COVID-19 vaccine except for her 4 th booster.Aware to get booster at her pharmacy.    Past Medical History:  Diagnosis Date   Anxiety    Atrial fibrillation with rapid ventricular response ([place]) 07/07/2015   Bilateral breast cancer (HCC)    Daily headache    Depression    GERD (gastroesophageal reflux disease)    Hyperlipidemia    Hypertension    Hypothyroidism    Iron deficiency anemia 2000s   Lumbago    Nausea alone    Senile osteoporosis    Squamous cell carcinoma of forehead    "I think they burned it off"   Past Surgical History:  Procedure Laterality Date   ABDOMINAL HYSTERECTOMY  1970s   BREAST BIOPSY Bilateral    BREAST LUMPECTOMY Right 2002   "w/lymph nodes removed"   CARDIOVERSION N/A 08/19/2015   Procedure: CARDIOVERSION;  Surgeon: Sanda Klein, MD;  Location: [facility];  Service: Cardiovascular;  Laterality: N/A;   CATARACT EXTRACTION W/ INTRAOCULAR LENS  IMPLANT, BILATERAL Bilateral 2000s   DILATION AND CURETTAGE OF UTERUS  ~ [place] Left 2011   "has 2 screws in it"   EXCISIONAL HEMORRHOIDECTOMY  ~ 1970  FRACTURE SURGERY  02/12/2016   Right Wrist   HEMIARTHROPLASTY HIP Left 2011   "had ball replaced"   [place] Right 1980s?   MASTECTOMY Left 2005   ORIF WRIST FRACTURE Left 02/12/2016   Procedure: OPEN REDUCTION INTERNAL FIXATION LEFT DISTAL RADIUS;  Surgeon: Dayna Barker, MD;  Location: [facility];  Service: Plastics;  Laterality: Left;   TOTAL THYROIDECTOMY  1972   WRIST FRACTURE SURGERY Right    "has a plate and 7 screws in there"    No  Known Allergies  Outpatient Encounter Medications as of 11/15/2020  Medication Sig   acetaminophen (TYLENOL) 325 MG tablet Take 2 tablets (650 mg total) by mouth every 4 (four) hours as needed for headache or mild pain.   albuterol (VENTOLIN HFA) 108 (90 Base) MCG/ACT inhaler Inhale 1 puff into the lungs 3 (three) times daily as needed for shortness of breath.   ALPRAZolam (XANAX) 0.5 MG tablet TAKE 1 TABLET BY MOUTH DAILY FOR ANXIETY   amLODipine (NORVASC) 2.5 MG tablet Take 1 tablet (2.5 mg total) by mouth daily.   Cholecalciferol (VITAMIN D3) 2000 UNITS TABS Take 1 tablet by mouth daily.    ELIQUIS 5 MG TABS tablet TAKE 1 TABLET BY MOUTH TWICE DAILY   furosemide (LASIX) 40 MG tablet TAKE 1 TABLET BY MOUTH DAILY   ibuprofen (ADVIL) 200 MG tablet Take 200 mg by mouth daily as needed for headache or mild pain.   levothyroxine (SYNTHROID) 137 MCG tablet TAKE 1 TABLET BY MOUTH DAILY 30 MINUTES BEFORE BREAKFAST   meclizine (ANTIVERT) 12.5 MG tablet Take 12.5 mg by mouth as needed for dizziness (OTC for dizziness).   metoprolol tartrate (LOPRESSOR) 50 MG tablet Take 1 tablet (50 mg total) by mouth 2 (two) times daily.   potassium chloride SA (KLOR-CON) 20 MEQ tablet Take 2 tablets (40 mEq total) by mouth daily.   rosuvastatin (CRESTOR) 10 MG tablet TAKE 1 TABLET BY MOUTH DAILY   sertraline (ZOLOFT) 25 MG tablet Take 2 tablets (50 mg total) by mouth daily.   No facility-administered encounter medications on file as of 11/15/2020.    Review of Systems  Constitutional:  Negative for appetite change, chills, fatigue and fever.  Respiratory:  Negative for cough, chest tightness, shortness of breath and wheezing.   Cardiovascular:  Negative for chest pain, palpitations and leg swelling.  Gastrointestinal:  Negative for abdominal distention, abdominal pain, constipation, diarrhea, nausea and vomiting.  Genitourinary:  Negative for decreased urine volume, difficulty urinating, dysuria, flank pain,  frequency and urgency.  Musculoskeletal:  Positive for arthralgias, back pain and gait problem. Negative for neck pain and neck stiffness.  Neurological:  Negative for dizziness, speech difficulty, weakness, light-headedness, numbness and headaches.  Psychiatric/Behavioral:  Negative for agitation, behavioral problems and confusion. The patient is not nervous/anxious.    Immunization History  Administered Date(s) Administered   Fluad Quad(high Dose 65+) 03/20/2019, 06/03/2020   Influenza, High Dose Seasonal PF 02/08/2017, 04/04/2018   Influenza,inj,Quad PF,6+ Mos 03/14/2013, 03/26/2014, 02/19/2015, 03/02/2016   PFIZER(Purple Top)SARS-COV-2 Vaccination 07/21/2019, 08/13/2019, 07/01/2020   Pneumococcal Conjugate-13 08/02/2013, 08/03/2014   Pneumococcal Polysaccharide-23 05/22/2009   Td 10/22/2017   Zoster Recombinat (Shingrix) 05/20/2018, 11/06/2018   Zoster, Live 09/25/2013   Pertinent  Health Maintenance Due  Topic Date Due   INFLUENZA VACCINE  12/20/2020   DEXA SCAN  Completed   PNA vac Low Risk Adult  Completed   Fall Risk  11/15/2020 09/27/2020 06/03/2020 05/27/2020 01/19/2020  Falls in the past year? 0  1 0 1 0  Number falls in past yr: 0 1 0 0 0  Injury with Fall? 0 0 0 0 0  Comment - - - - -  Risk for fall due to : No Fall Risks - - - -  Follow up Falls evaluation completed - - - -  Comment - - - - -   Functional Status Survey:    There were no vitals filed for this visit. There is no height or weight on file to calculate BMI. Physical Exam  Unable to complete on telephone visit.  Labs reviewed: Recent Labs    01/15/20 1009 01/19/20 1120 09/24/20 1155  NA 139 141 142  K 3.4* 4.5 3.5  CL 97* 103 101  CO2 33* 31 31  GLUCOSE 94 101* 90  BUN 14 12 19   CREATININE 1.24* 1.02* 1.28*  CALCIUM 9.6 9.6 9.0   Recent Labs    09/24/20 1155  AST 23  ALT 15  BILITOT 0.8  PROT 6.6   Recent Labs    01/15/20 1009 09/24/20 1155  WBC 8.0 5.8  NEUTROABS 4,440 3,277  HGB  12.3 11.1*  HCT 39.6 36.4  MCV 80.0 84.8  PLT 175 152   Lab Results  Component Value Date   TSH 0.21 (L) 09/24/2020   Lab Results  Component Value Date   HGBA1C 6.0 (H) 09/15/2019   Lab Results  Component Value Date   CHOL 99 09/15/2019   HDL 35 (L) 09/15/2019   LDLCALC 45 09/15/2019   TRIG 100 09/15/2019   CHOLHDL 2.8 09/15/2019    Significant Diagnostic Results in last 30 days:  No results found.  Assessment/Plan  1.Acute right-sided low back pain without sciatica Twisted lower back when trying to lift her commode lid. - continue on Roll on for pain  - Advised to take Extra strength Tylenol 500 mg tablet every 8 hrs as needed for pain  - Address given to get X-ray done at [facility] over [place].Read back X-ray address. - DG Lumbar Spine Complete; Future  2. COVID-19 exposure  Had contact with son who had COVID-19 one week ago.Asymptomatic  - Notify provider for any symptoms - Has completed her COVID-19 vaccine except for 4 the booster.Advised to get booster at the pharmacy.  - continue to wear facial mask and practice hand hygiene. Has competed 5 days without any symptoms no self quarantine.    Family/ staff Communication: Reviewed plan of care with patient verbalized understanding.   Labs/tests ordered: - DG Lumbar Spine Complete; Future  Next Appointment: As needed if symptoms worsen or fail to improve  I connected with  [name] on 11/15/20 by a telephone enabled telemedicine application and verified that I am speaking with the correct person using two identifiers.   I discussed the limitations of evaluation and management by telemedicine. The patient expressed understanding and agreed to proceed.  Spent 12 minutes of non-face to face with patient     Sandrea Hughs, NP

## 2020-11-15 NOTE — Patient Instructions (Addendum)
- Please get Lumbar Spine X-ray at [facility] imaging at [facility] then will call you with results. - continue on Roll on for pain   - Take Extra strength Tylenol 500 mg tablet every 8 hrs as needed for pain   Acute Back Pain, Adult Acute back pain is sudden and usually short-lived. It is often caused by an injury to the muscles and tissues in the back. The injury may result from: A muscle or ligament getting overstretched or torn (strained). Ligaments are tissues that connect bones to each other. Lifting something improperly can cause a back strain. Wear and tear (degeneration) of the spinal disks. Spinal disks are circular tissue that provide cushioning between the bones of the spine (vertebrae). Twisting motions, such as while playing sports or doing yard work. A hit to the back. Arthritis. You may have a physical exam, lab tests, and imaging tests to find the cause ofyour pain. Acute back pain usually goes away with rest and home care. Follow these instructions at home: Managing pain, stiffness, and swelling Treatment may include medicines for pain and inflammation that are taken by mouth or applied to the skin, prescription pain medicine, or muscle relaxants. Take over-the-counter and prescription medicines only as told by your health care provider. Your health care provider may recommend applying ice during the first 24-48 hours after your pain starts. To do this: Put ice in a plastic bag. Place a towel between your skin and the bag. Leave the ice on for 20 minutes, 2-3 times a day. If directed, apply heat to the affected area as often as told by your health care provider. Use the heat source that your health care provider recommends, such as a moist heat pack or a heating pad. Place a towel between your skin and the heat source. Leave the heat on for 20-30 minutes. Remove the heat if your skin turns bright red. This is especially important if you are unable to feel  pain, heat, or cold. You have a greater risk of getting burned. Activity  Do not stay in bed. Staying in bed for more than 1-2 days can delay your recovery. Sit up and stand up straight. Avoid leaning forward when you sit or hunching over when you stand. If you work at a desk, sit close to it so you do not need to lean over. Keep your chin tucked in. Keep your neck drawn back, and keep your elbows bent at a 90-degree angle (right angle). Sit high and close to the steering wheel when you drive. Add lower back (lumbar) support to your car seat, if needed. Take short walks on even surfaces as soon as you are able. Try to increase the length of time you walk each day. Do not sit, drive, or stand in one place for more than 30 minutes at a time. Sitting or standing for long periods of time can put stress on your back. Do not drive or use heavy machinery while taking prescription pain medicine. Use proper lifting techniques. When you bend and lift, use positions that put less stress on your back: Whitlash your knees. Keep the load close to your body. Avoid twisting. Exercise regularly as told by your health care provider. Exercising helps your back heal faster and helps prevent back injuries by keeping muscles strong and flexible. Work with a physical therapist to make a safe exercise program, as recommended by your health care provider. Do any exercises as told by your physical therapist.  Lifestyle Maintain a healthy weight. Extra weight puts stress on your back and makes it difficult to have good posture. Avoid activities or situations that make you feel anxious or stressed. Stress and anxiety increase muscle tension and can make back pain worse. Learn ways to manage anxiety and stress, such as through exercise. General instructions Sleep on a firm mattress in a comfortable position. Try lying on your side with your knees slightly bent. If you lie on your back, put a pillow under your knees. Follow  your treatment plan as told by your health care provider. This may include: Cognitive or behavioral therapy. Acupuncture or massage therapy. Meditation or yoga. Contact a health care provider if: You have pain that is not relieved with rest or medicine. You have increasing pain going down into your legs or buttocks. Your pain does not improve after 2 weeks. You have pain at night. You lose weight without trying. You have a fever or chills. Get help right away if: You develop new bowel or bladder control problems. You have unusual weakness or numbness in your arms or legs. You develop nausea or vomiting. You develop abdominal pain. You feel faint. Summary Acute back pain is sudden and usually short-lived. Use proper lifting techniques. When you bend and lift, use positions that put less stress on your back. Take over-the-counter and prescription medicines and apply heat or ice as directed by your health care provider. This information is not intended to replace advice given to you by your health care provider. Make sure you discuss any questions you have with your healthcare provider. Document Revised: 01/27/2020 Document Reviewed: 01/30/2020 Elsevier Patient Education  2022 [employer].

## 2020-11-16 ENCOUNTER — Other Ambulatory Visit: Payer: Self-pay | Admitting: Family

## 2020-11-16 DIAGNOSIS — M5136 Other intervertebral disc degeneration, lumbar region: Secondary | ICD-10-CM

## 2020-11-16 DIAGNOSIS — S32050S Wedge compression fracture of fifth lumbar vertebra, sequela: Secondary | ICD-10-CM

## 2020-11-23 ENCOUNTER — Ambulatory Visit (INDEPENDENT_AMBULATORY_CARE_PROVIDER_SITE_OTHER): Payer: Medicare HMO | Admitting: Family Medicine

## 2020-11-23 ENCOUNTER — Other Ambulatory Visit: Payer: Self-pay

## 2020-11-23 DIAGNOSIS — M810 Age-related osteoporosis without current pathological fracture: Secondary | ICD-10-CM | POA: Diagnosis not present

## 2020-11-23 DIAGNOSIS — K921 Melena: Secondary | ICD-10-CM | POA: Diagnosis not present

## 2020-11-23 DIAGNOSIS — M545 Low back pain, unspecified: Secondary | ICD-10-CM | POA: Diagnosis not present

## 2020-11-23 MED ORDER — HYDROCODONE-ACETAMINOPHEN 5-325 MG PO TABS: 1.0000 | ORAL_TABLET | Freq: Four times a day (QID) | ORAL | 0 refills | Status: DC | PRN

## 2020-11-23 NOTE — Progress Notes (Signed)
Office Visit Note   Patient: Deborah Randall           Date of Birth: 1941/05/15           MRN: [id number] Visit Date: 11/23/2020 Requested by: Sandrea Hughs, NP [address] [place],  [place] [address] PCP: Sandrea Hughs, NP  Subjective: Chief Complaint  Patient presents with   Lower Back - Pain    Mild compression fracture seen on recent Lsp xrays. A little over a week ago, she lifted the top off the toilet tank -- was heavier than she thought. She had difficulty holding it up and the lid fell back against her right lower abdominal region. She said that area felt like it "just rolled up." No bruising. Has had pain in the back since then -- hurts to lie down, bend over, stand much. In wheelchair today. Reports black stools for the past 3 days. Is on eliquis bid.     HPI: She is here with low back and right lower abdomen pain.  About a week ago she was lifting the lid off the back of the commode.  She felt immediate pain, and the lid started to slip and it hit her in the right lower abdominal area.  She has had severe pain since then, unable to sleep at night.  She has also had black stools for the last couple days.  She is on anticoagulants.  She has a history of osteoporosis, not currently being treated.  She took Fosamax in the past.  Last bone density test was 2019.  She had x-rays done recently which showed a questionable L3 compression fracture.  Patient states that she cannot lie flat.  She has to sit in a chair most of the time.  She denies any bright red blood per rectum.  She does have some troubles with mild shortness of breath.                ROS:   All other systems were reviewed and are negative.  Objective: Vital Signs: There were no vitals taken for this visit.  Physical Exam:  General:  Alert and oriented, in no acute distress. Pulm:  Breathing unlabored. Psy:  Normal mood, congruent affect. Skin: No bruising seen Low back: Mild tenderness to palpation along the L3 and  L4 spinous processes.  Pain in the back is not severe.  No significant pain with internal hip rotation or straight leg raise.  Lower extremity strength and reflexes are normal.  Right lower abdomen is soft, nondistended and I cannot completely reproduce her pain by palpation.    Imaging: No results found.  Assessment & Plan: Right lower back and lower abdominal pain, etiology uncertain.  Could be lumbar compression fracture with nerve impingement.  Possible melena concerning for gastrointestinal bleed. -Hydrocodone for pain.  She has an appoint with her PCP tomorrow so we will go ahead and order CBC today and stool Hemoccult. -If pain does not improve quickly, then lumbar MRI scan.     Procedures: No procedures performed        PMFS History: Patient Active Problem List   Diagnosis Date Noted   Anxiety    Closed fracture of left distal radius 02/11/2016   Persistent atrial fibrillation (HCC)    Chronic anticoagulation 08/11/2015   Chronic renal insufficiency, stage III (moderate) (HCC) 08/11/2015   Chest pain with moderate risk of acute coronary syndrome 07/08/2015   Allergic rhinitis 08/03/2014   Hypercholesterolemia 09/25/2013  Hypothyroidism 09/05/2012   Depression 09/05/2012   Essential hypertension, benign 09/05/2012   Senile osteoporosis 09/05/2012   HX: breast cancer 03/19/2012   Past Medical History:  Diagnosis Date   Anxiety    Atrial fibrillation with rapid ventricular response ([place]) 07/07/2015   Bilateral breast cancer ([place])    Daily headache    Depression    GERD (gastroesophageal reflux disease)    Hyperlipidemia    Hypertension    Hypothyroidism    Iron deficiency anemia 2000s   Lumbago    Nausea alone    Senile osteoporosis    Squamous cell carcinoma of forehead    "I think they burned it off"    Family History  Problem Relation Age of Onset   Cancer Father    Heart attack Brother    Mental illness Son    Other Brother    Other Brother     Cancer Sister    Heart disease Sister    Hypertension Sister    Other Brother    Alzheimer's disease Brother    Diabetes Brother    COPD Brother    Mental illness Son     Past Surgical History:  Procedure Laterality Date   ABDOMINAL HYSTERECTOMY  1970s   BREAST BIOPSY Bilateral    BREAST LUMPECTOMY Right 2002   "w/lymph nodes removed"   CARDIOVERSION N/A 08/19/2015   Procedure: CARDIOVERSION;  Surgeon: Sanda Klein, MD;  Location: [facility];  Service: Cardiovascular;  Laterality: N/A;   CATARACT EXTRACTION W/ INTRAOCULAR LENS  IMPLANT, BILATERAL Bilateral 2000s   DILATION AND CURETTAGE OF UTERUS  ~ [place] Left 2011   "has 2 screws in it"   EXCISIONAL HEMORRHOIDECTOMY  ~ [place]  02/12/2016   Right Wrist   HEMIARTHROPLASTY HIP Left 2011   "had ball replaced"   INGUINAL HERNIA REPAIR Right 1980s?   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

## 2020-11-24 ENCOUNTER — Telehealth: Payer: Self-pay | Admitting: Family Medicine

## 2020-11-24 ENCOUNTER — Other Ambulatory Visit: Payer: Medicare HMO

## 2020-11-24 LAB — CBC WITH DIFFERENTIAL/PLATELET
Absolute Monocytes: 909 cells/uL (ref 200–950)
Basophils Absolute: 90 cells/uL (ref 0–200)
Basophils Relative: 1 %
Eosinophils Absolute: 324 cells/uL (ref 15–500)
Eosinophils Relative: 3.6 %
HCT: 35 % (ref 35.0–45.0)
Hemoglobin: 10.4 g/dL — ABNORMAL LOW (ref 11.7–15.5)
Lymphs Abs: 2493 cells/uL (ref 850–3900)
MCH: 25.2 pg — ABNORMAL LOW (ref 27.0–33.0)
MCHC: 29.7 g/dL — ABNORMAL LOW (ref 32.0–36.0)
MCV: 85 fL (ref 80.0–100.0)
MPV: 10 fL (ref 7.5–12.5)
Monocytes Relative: 10.1 %
Neutro Abs: 5184 cells/uL (ref 1500–7800)
Neutrophils Relative %: 57.6 %
Platelets: 165 10*3/uL (ref 140–400)
RBC: 4.12 10*6/uL (ref 3.80–5.10)
RDW: 15.1 % — ABNORMAL HIGH (ref 11.0–15.0)
Total Lymphocyte: 27.7 %
WBC: 9 10*3/uL (ref 3.8–10.8)

## 2020-11-24 LAB — VITAMIN D 25 HYDROXY (VIT D DEFICIENCY, FRACTURES): Vit D, 25-Hydroxy: 52 ng/mL (ref 30–100)

## 2020-11-24 NOTE — Telephone Encounter (Signed)
I called and advised the patient of the lab results. She does not have a set appointment with her PCP today - it was just a lab visit. The patient cancelled that, since she just had bloodwork. I advised her to get in touch with that office, though, to discuss the anemia and the next steps for this. She voiced understanding. The patient did pick up a hemoccult card kit from [facility] yesterday -- to return it to the lab once completed.

## 2020-11-24 NOTE — Telephone Encounter (Signed)
Labs show anemia, with hemoglobin of 10.4.  This is worse than it was in May but similar to what it was a year ago.  She needs to discuss this with her PCP when she meets with her today.  Vitamin D level looks good at 52 so she should continue with current dosage.

## 2020-11-30 DIAGNOSIS — K921 Melena: Secondary | ICD-10-CM | POA: Diagnosis not present

## 2020-12-01 ENCOUNTER — Other Ambulatory Visit (INDEPENDENT_AMBULATORY_CARE_PROVIDER_SITE_OTHER): Payer: Self-pay | Admitting: Family Medicine

## 2020-12-01 ENCOUNTER — Telehealth: Payer: Self-pay | Admitting: Family Medicine

## 2020-12-01 DIAGNOSIS — K921 Melena: Secondary | ICD-10-CM

## 2020-12-01 LAB — FECAL GLOBIN BY IMMUNOCHEMISTRY
FECAL GLOBIN RESULT:: DETECTED — AB
MICRO NUMBER:: 12108911
SPECIMEN QUALITY:: ADEQUATE

## 2020-12-01 NOTE — Telephone Encounter (Signed)
Tried calling the patient - no answer and voice mail has not been set up yet. Will try again later.

## 2020-12-01 NOTE — Addendum Note (Signed)
Addended byMarlowe Sax C on: 12/01/2020 04:45 PM   Modules accepted: Orders

## 2020-12-01 NOTE — Telephone Encounter (Signed)
I called patient and notified of her lab results. Patient states that she will make appointment with Cardiologist as soon as possible to discuss Eliquis. Patient states she is fine with referral to Gastroenterology.

## 2020-12-01 NOTE — Telephone Encounter (Signed)
Notify patient that stool tested positive for blood ( test done by Dr[facility])  recommend referral to Gastroenterology for further evaluation. Also recommend appointment with Cardiologist as soon as possible to evaluate Eliquis due to blood in the stool.

## 2020-12-01 NOTE — Telephone Encounter (Signed)
Gastroenterology referral ordered.

## 2020-12-01 NOTE — Telephone Encounter (Signed)
Stool tested positive for microscopic blood.  She should contact her PCP - may need GI referral.

## 2020-12-03 ENCOUNTER — Telehealth: Payer: Self-pay | Admitting: Cardiovascular Disease

## 2020-12-03 NOTE — Telephone Encounter (Signed)
Called patient, advised of message from MD.  Patient verbalized understanding.   

## 2020-12-03 NOTE — Telephone Encounter (Signed)
Called patient, she states that her PCP did a stool sample, did have blood in her stool- per referrals they did place the referral as urgent to gastroenterology, but she has not heard anything yet. Her PCP did mention something to her about holding the Eliquis until we find where the bleed is coming from, and to reach out to her Cardiologist.  I advised I would send a message to MD to advise and would call back with recommendations.  Patient verbalized understanding.

## 2020-12-03 NOTE — Telephone Encounter (Signed)
Pt c/o medication issue:  1. Name of Medication:  ELIQUIS 5 MG TABS tablet  2. How are you currently taking this medication (dosage and times per day)?  As prescribed  3. Are you having a reaction (difficulty breathing--STAT)? Yes   4. What is your medication issue? Having problems with bleeding. Had an accident at home where the lid of the back of the toilet was too heavy for her while lifting it causing her to drop it and it fell onto her hip area. She is now hurting on her right side. Having dark stool. Sent in a sample and was advised there is blood in the stool. Medical Doctor suggested possibly stopping blood thinner due to it. Needs to see a gastrologist. Thinks Medical Doctor may be setting that up not to sure. Had xray with orthopedic doctor and was referred back to Dr. Loletha Grayer. Requesting an appointment in regards to this, but Dr. Loletha Grayer is booked out. Please advise.

## 2020-12-08 ENCOUNTER — Other Ambulatory Visit: Payer: Self-pay | Admitting: Family Medicine

## 2020-12-15 ENCOUNTER — Ambulatory Visit: Payer: Medicare HMO | Admitting: Family

## 2021-01-13 ENCOUNTER — Other Ambulatory Visit: Payer: Self-pay | Admitting: Internal Medicine

## 2021-01-13 NOTE — Telephone Encounter (Signed)
Prescription refill request for Eliquis received. Indication:afib Last office visit:croitoru 12/03/20 Scr:1.28 09/24/20 Age: 27fWeight:67.5kg

## 2021-01-14 ENCOUNTER — Emergency Department (HOSPITAL_COMMUNITY): Payer: Medicare HMO

## 2021-01-14 ENCOUNTER — Other Ambulatory Visit: Payer: Self-pay

## 2021-01-14 ENCOUNTER — Emergency Department (HOSPITAL_COMMUNITY)
Admission: EM | Admit: 2021-01-14 | Discharge: 2021-01-14 | Disposition: A | Discharge status: Home / Self Care | Payer: Medicare HMO | Attending: Emergency Medicine | Admitting: Emergency Medicine

## 2021-01-14 ENCOUNTER — Encounter (HOSPITAL_COMMUNITY): Payer: Self-pay

## 2021-01-14 DIAGNOSIS — I10 Essential (primary) hypertension: Secondary | ICD-10-CM | POA: Diagnosis not present

## 2021-01-14 DIAGNOSIS — M25511 Pain in right shoulder: Secondary | ICD-10-CM | POA: Insufficient documentation

## 2021-01-14 DIAGNOSIS — Z96642 Presence of left artificial hip joint: Secondary | ICD-10-CM | POA: Insufficient documentation

## 2021-01-14 DIAGNOSIS — K5730 Diverticulosis of large intestine without perforation or abscess without bleeding: Secondary | ICD-10-CM | POA: Insufficient documentation

## 2021-01-14 DIAGNOSIS — K449 Diaphragmatic hernia without obstruction or gangrene: Secondary | ICD-10-CM | POA: Insufficient documentation

## 2021-01-14 DIAGNOSIS — E039 Hypothyroidism, unspecified: Secondary | ICD-10-CM | POA: Insufficient documentation

## 2021-01-14 DIAGNOSIS — N183 Chronic kidney disease, stage 3 unspecified: Secondary | ICD-10-CM | POA: Insufficient documentation

## 2021-01-14 DIAGNOSIS — M545 Low back pain, unspecified: Secondary | ICD-10-CM

## 2021-01-14 DIAGNOSIS — K828 Other specified diseases of gallbladder: Secondary | ICD-10-CM | POA: Diagnosis not present

## 2021-01-14 DIAGNOSIS — I129 Hypertensive chronic kidney disease with stage 1 through stage 4 chronic kidney disease, or unspecified chronic kidney disease: Secondary | ICD-10-CM | POA: Insufficient documentation

## 2021-01-14 DIAGNOSIS — R109 Unspecified abdominal pain: Secondary | ICD-10-CM | POA: Insufficient documentation

## 2021-01-14 DIAGNOSIS — Z7901 Long term (current) use of anticoagulants: Secondary | ICD-10-CM | POA: Diagnosis not present

## 2021-01-14 DIAGNOSIS — Z85828 Personal history of other malignant neoplasm of skin: Secondary | ICD-10-CM | POA: Diagnosis not present

## 2021-01-14 DIAGNOSIS — Z79899 Other long term (current) drug therapy: Secondary | ICD-10-CM | POA: Diagnosis not present

## 2021-01-14 DIAGNOSIS — I4891 Unspecified atrial fibrillation: Secondary | ICD-10-CM | POA: Insufficient documentation

## 2021-01-14 DIAGNOSIS — Z853 Personal history of malignant neoplasm of breast: Secondary | ICD-10-CM | POA: Diagnosis not present

## 2021-01-14 DIAGNOSIS — K429 Umbilical hernia without obstruction or gangrene: Secondary | ICD-10-CM | POA: Diagnosis not present

## 2021-01-14 LAB — COMPREHENSIVE METABOLIC PANEL
ALT: 19 U/L (ref 0–44)
AST: 26 U/L (ref 15–41)
Albumin: 3.5 g/dL (ref 3.5–5.0)
Alkaline Phosphatase: 82 U/L (ref 38–126)
Anion gap: 9 (ref 5–15)
BUN: 11 mg/dL (ref 8–23)
CO2: 32 mmol/L (ref 22–32)
Calcium: 8.9 mg/dL (ref 8.9–10.3)
Chloride: 100 mmol/L (ref 98–111)
Creatinine, Ser: 1.06 mg/dL — ABNORMAL HIGH (ref 0.44–1.00)
GFR, Estimated: 53 mL/min — ABNORMAL LOW (ref >60)
Glucose, Bld: 105 mg/dL — ABNORMAL HIGH (ref 70–99)
Potassium: 2.8 mmol/L — ABNORMAL LOW (ref 3.5–5.1)
Sodium: 141 mmol/L (ref 135–145)
Total Bilirubin: 1 mg/dL (ref 0.3–1.2)
Total Protein: 6.6 g/dL (ref 6.5–8.1)

## 2021-01-14 LAB — CBC WITH DIFFERENTIAL/PLATELET
Abs Immature Granulocytes: 0.03 10*3/uL (ref 0.00–0.07)
Basophils Absolute: 0.1 10*3/uL (ref 0.0–0.1)
Basophils Relative: 1 %
Eosinophils Absolute: 0.1 10*3/uL (ref 0.0–0.5)
Eosinophils Relative: 1 %
HCT: 38.1 % (ref 36.0–46.0)
Hemoglobin: 11.6 g/dL — ABNORMAL LOW (ref 12.0–15.0)
Immature Granulocytes: 0 %
Lymphocytes Relative: 21 %
Lymphs Abs: 1.9 10*3/uL (ref 0.7–4.0)
MCH: 25.2 pg — ABNORMAL LOW (ref 26.0–34.0)
MCHC: 30.4 g/dL (ref 30.0–36.0)
MCV: 82.8 fL (ref 80.0–100.0)
Monocytes Absolute: 0.9 10*3/uL (ref 0.1–1.0)
Monocytes Relative: 10 %
Neutro Abs: 6.2 10*3/uL (ref 1.7–7.7)
Neutrophils Relative %: 67 %
Platelets: 141 10*3/uL — ABNORMAL LOW (ref 150–400)
RBC: 4.6 MIL/uL (ref 3.87–5.11)
RDW: 16.4 % — ABNORMAL HIGH (ref 11.5–15.5)
WBC: 9.2 10*3/uL (ref 4.0–10.5)
nRBC: 0 % (ref 0.0–0.2)

## 2021-01-14 LAB — URINALYSIS, ROUTINE W REFLEX MICROSCOPIC
Bilirubin Urine: NEGATIVE
Glucose, UA: NEGATIVE mg/dL
Hgb urine dipstick: NEGATIVE
Ketones, ur: NEGATIVE mg/dL
Leukocytes,Ua: NEGATIVE
Nitrite: NEGATIVE
Protein, ur: NEGATIVE mg/dL
Specific Gravity, Urine: 1.016 (ref 1.005–1.030)
pH: 6 (ref 5.0–8.0)

## 2021-01-14 LAB — LIPASE, BLOOD: Lipase: 34 U/L (ref 11–51)

## 2021-01-14 MED ORDER — HYDROCODONE-ACETAMINOPHEN 5-325 MG PO TABS
1.0000 | ORAL_TABLET | Freq: Once | ORAL | Status: AC
  Administered 2021-01-14: 1 via ORAL
  Filled 2021-01-14: qty 1

## 2021-01-14 MED ORDER — POTASSIUM CHLORIDE CRYS ER 20 MEQ PO TBCR
40.0000 meq | EXTENDED_RELEASE_TABLET | Freq: Once | ORAL | Status: AC
  Administered 2021-01-14: 40 meq via ORAL
  Filled 2021-01-14: qty 2

## 2021-01-14 MED ORDER — OXYCODONE HCL 5 MG PO TABS: 5.0000 mg | ORAL_TABLET | ORAL | 0 refills | Status: DC | PRN

## 2021-01-14 MED ORDER — IOHEXOL 350 MG/ML SOLN
100.0000 mL | Freq: Once | INTRAVENOUS | Status: AC | PRN
  Administered 2021-01-14: 100 mL via INTRAVENOUS

## 2021-01-14 MED ORDER — HYDROCODONE-ACETAMINOPHEN 5-325 MG PO TABS
1.0000 | ORAL_TABLET | Freq: Once | ORAL | Status: AC
Start: 2021-01-14 — End: 2021-01-14
  Administered 2021-01-14: 1 via ORAL
  Filled 2021-01-14: qty 1

## 2021-01-14 NOTE — ED Provider Notes (Signed)
Right lower back pain that radiates up to her right flank for the past 3 days.  No known trauma.  No nausea, vomiting, abdominal pain.  No urinary symptoms.  Vitals:   01/14/21 1103 01/14/21 1355  BP: 135/75 132/72  Pulse: 75 74  Resp: 16 16  Temp: 98.5 F (36.9 C)   SpO2: 91% 96%  CT ordered by previous team, will follow up this imaging.  Imaging shows a new L3 superior endplate compression fracture with around 25% height loss.  Also she has a focal muscular abnormality of the gallbladder and a likely IPMN of the pancreas.  Radiology recommends nonemergent MRCP to evaluate both of these incidental findings.  I discussed the incidental findings at bedside with both the patient, husband, and I provided them in written format.  Regarding the patient's new fracture, on my evaluation the patient has no midline spinal tenderness whatsoever.  She has some right inferior thoracic paraspinal muscle tenderness to palpation, but no tenderness at the level of the L3 fracture or in the midline.  I do not believe this is responsible for her symptoms.  Presentation seems most consistent with sprain/strain injury given her reassuring work-up here.  Her LFTs are normal.  Urine is not infected.  No evidence of kidney stone.  I believe that she is stable for discharge from the emergency department with close outpatient follow-up.  We will provide a prescription for analgesics.  I do not believe she needs an emergent spine consultation or spinal brace.  She has no focal neurologic deficits when I reevaluated the patient prior to discharge.  Patient and caregiver at bedside are comfortable with the plan for discharge home and outpatient follow-up.  I encouraged her to take her outpatient potassium that has already been prescribed to her, she was slightly hypokalemic here.  Strict return precautions discussed.   Claud Kelp, MD 01/14/21 [place]    Isla Pence, MD 01/14/21 [phone]

## 2021-01-14 NOTE — Discharge Instructions (Addendum)
You have a new compression fracture of one of your lower back bones.  It is also possible that you have some strain associated with this on your right side.  Please take the pain medication that I have prescribed as prescribed.  Do not take it with alcohol or any other sedating medication such as Xanax or any other opioid pain meds that you may have at home.  You can also take Tylenol and ibuprofen every 6 hours as needed for pain.  I recommend following with your primary care doctor.  Bed rest can make this kind of pain worse, I would encourage you to get up and walk around the house is much as possible.  You may need physical therapy, which can also be prescribed by your primary care doctor.  You had a spot on your pancreas and gallbladder that the radiologist recommend be further evaluated with a special kind of MRI of your abdomen.  This is not an emergent requirement, but an incidental finding that you should make your primary care doctor aware of.

## 2021-01-14 NOTE — ED Notes (Signed)
Patient transported to CT 

## 2021-01-14 NOTE — ED Provider Notes (Signed)
[facility] EMERGENCY DEPARTMENT Provider Note   CSN: [id number] Arrival date & time: 01/14/21  1059     History Chief Complaint  Patient presents with   Back Pain    Deborah Randall is a 80 y.o. female.  Patient is an 80 year old female with a past medical history of hypertension, HLD, hypothyroidism, CKD that is presenting for right lower back pain.  Patient states that she has had back pain for the last 2 months, however, her pain has increased since Wednesday.  She complains of right lower back pain with no midline spinal tenderness.  She states that her right lower back pain radiates up towards her right shoulder.  She denies any falls or trauma.  She denies any fevers, urinary incontinence, bowel incontinence.  She denies any weakness or numbness.  She denies any hematuria, dysuria, changes in urinary frequency.  Patient has no pain with palpation of right lower back.  Patient is able ambulate without complications. Her last bowel movement was today. She denies any constipation.   Patient denies any fevers, chills, headache, neck stiffness, chest pain, shortness of breath, nausea, vomiting, diarrhea, numbness or weakness.  Back Pain Associated symptoms: no abdominal pain, no chest pain, no dysuria, no fever, no headaches, no numbness and no weakness         Past Medical History:  Diagnosis Date   Anxiety    Atrial fibrillation with rapid ventricular response (HCC) 07/07/2015   Bilateral breast cancer ([place])    Daily headache    Depression    GERD (gastroesophageal reflux disease)    Hyperlipidemia    Hypertension    Hypothyroidism    Iron deficiency anemia 2000s   Lumbago    Nausea alone    Senile osteoporosis    Squamous cell carcinoma of forehead    "I think they burned it off"    Patient Active Problem List   Diagnosis Date Noted   Anxiety    Closed fracture of left distal radius 02/11/2016   Persistent atrial fibrillation ([place])    Chronic  anticoagulation 08/11/2015   Chronic renal insufficiency, stage III (moderate) ([place]) 08/11/2015   Chest pain with moderate risk of acute coronary syndrome 07/08/2015   Allergic rhinitis 08/03/2014   Hypercholesterolemia 09/25/2013   Hypothyroidism 09/05/2012   Depression 09/05/2012   Essential hypertension, benign 09/05/2012   Senile osteoporosis 09/05/2012   HX: breast cancer 03/19/2012    Past Surgical History:  Procedure Laterality Date   ABDOMINAL HYSTERECTOMY  1970s   BREAST BIOPSY Bilateral    BREAST LUMPECTOMY Right 2002   "w/lymph nodes removed"   CARDIOVERSION N/A 08/19/2015   Procedure: CARDIOVERSION;  Surgeon: Sanda Klein, MD;  Location: [place];  Service: Cardiovascular;  Laterality: N/A;   CATARACT EXTRACTION W/ INTRAOCULAR LENS  IMPLANT, BILATERAL Bilateral 2000s   DILATION AND CURETTAGE OF UTERUS  ~ [place] Left 2011   "has 2 screws in it"   EXCISIONAL HEMORRHOIDECTOMY  ~ [place]  02/12/2016   Right Wrist   HEMIARTHROPLASTY HIP Left 2011   "had ball replaced"   INGUINAL HERNIA REPAIR Right 1980s?   MASTECTOMY Left 2005   ORIF WRIST FRACTURE Left 02/12/2016   Procedure: OPEN REDUCTION INTERNAL FIXATION LEFT DISTAL RADIUS;  Surgeon: Dayna Barker, MD;  Location: [place];  Service: Plastics;  Laterality: Left;   TOTAL THYROIDECTOMY  1972   WRIST FRACTURE SURGERY Right    "has a plate and 7  screws in there"     OB History   No obstetric history on file.     Family History  Problem Relation Age of Onset   Cancer Father    Heart attack Brother    Mental illness Son    Other Brother    Other Brother    Cancer Sister    Heart disease Sister    Hypertension Sister    Other Brother    Alzheimer's disease Brother    Diabetes Brother    COPD Brother    Mental illness Son     Social History   Tobacco Use   Smoking status: Never   Smokeless tobacco: Never  Vaping Use   Vaping Use: Never used  Substance Use  Topics   Alcohol use: No   Drug use: No    Home Medications Prior to Admission medications   Medication Sig Start Date End Date Taking? Authorizing Provider  ELIQUIS 5 MG TABS tablet TAKE 1 TABLET BY MOUTH TWICE DAILY 01/13/21   Croitoru, Dani Gobble, MD  acetaminophen (TYLENOL) 325 MG tablet Take 2 tablets (650 mg total) by mouth every 4 (four) hours as needed for headache or mild pain. 07/08/15   Erlene Quan, PA-C  albuterol (VENTOLIN HFA) 108 (90 Base) MCG/ACT inhaler Inhale 1 puff into the lungs 3 (three) times daily as needed for shortness of breath.    [provider]  ALPRAZolam (XANAX) 0.5 MG tablet TAKE 1 TABLET BY MOUTH DAILY FOR ANXIETY 09/27/20   Ngetich, Dinah C, NP  amLODipine (NORVASC) 2.5 MG tablet Take 1 tablet (2.5 mg total) by mouth daily. 08/18/20   Croitoru, Mihai, MD  Cholecalciferol (VITAMIN D3) 2000 UNITS TABS Take 1 tablet by mouth daily.     [provider]  furosemide (LASIX) 40 MG tablet TAKE 1 TABLET BY MOUTH DAILY 08/20/20   Ngetich, Dinah C, NP  HYDROcodone-acetaminophen (NORCO/VICODIN) 5-325 MG tablet Take 1 tablet by mouth every 6 (six) hours as needed for moderate pain. 11/23/20   Hilts, Legrand Como, MD  ibuprofen (ADVIL) 200 MG tablet Take 200 mg by mouth daily as needed for headache or mild pain.    [provider]  levothyroxine (SYNTHROID) 137 MCG tablet TAKE 1 TABLET BY MOUTH DAILY 30 MINUTES BEFORE BREAKFAST 09/27/20   Ngetich, Dinah C, NP  meclizine (ANTIVERT) 12.5 MG tablet Take 12.5 mg by mouth as needed for dizziness (OTC for dizziness).    [provider]  metoprolol tartrate (LOPRESSOR) 50 MG tablet Take 1 tablet (50 mg total) by mouth 2 (two) times daily. 09/16/20   Croitoru, Mihai, MD  potassium chloride SA (KLOR-CON) 20 MEQ tablet Take 2 tablets (40 mEq total) by mouth daily. 10/22/19   Reed, Tiffany L, DO  rosuvastatin (CRESTOR) 10 MG tablet TAKE 1 TABLET BY MOUTH DAILY 08/05/20   Ngetich, Dinah C, NP  sertraline (ZOLOFT) 25 MG  tablet Take 2 tablets (50 mg total) by mouth daily. 10/12/20   Mast, Man X, NP    Allergies    Patient has no known allergies.  Review of Systems   Review of Systems  Constitutional:  Negative for chills, diaphoresis, fatigue and fever.  HENT:  Negative for congestion, dental problem, ear discharge, ear pain, facial swelling, hearing loss, nosebleeds, postnasal drip, rhinorrhea, sinus pain, sneezing, sore throat and trouble swallowing.   Eyes:  Negative for pain and visual disturbance.  Respiratory:  Negative for cough, chest tightness, shortness of breath, wheezing and stridor.   Cardiovascular:  Negative for chest pain, palpitations and leg swelling.  Gastrointestinal:  Negative for abdominal distention, abdominal pain, blood in stool, constipation, diarrhea, nausea and vomiting.  Endocrine: Negative for polydipsia and polyuria.  Genitourinary:  Negative for difficulty urinating, dysuria, flank pain, frequency, hematuria, urgency, vaginal bleeding and vaginal discharge.  Musculoskeletal:  Positive for back pain. Negative for myalgias, neck pain and neck stiffness.  Skin:  Negative for rash and wound.  Allergic/Immunologic: Negative for environmental allergies and food allergies.  Neurological:  Negative for dizziness, seizures, syncope, facial asymmetry, speech difficulty, weakness, light-headedness, numbness and headaches.  Psychiatric/Behavioral:  Negative for agitation, behavioral problems and confusion.    Physical Exam Updated Vital Signs BP 135/75 (BP Location: Right Arm)   Pulse 75   Temp 98.5 F (36.9 C) (Oral)   Resp 16   Ht 5' 4.5" (1.638 m)   Wt 64.9 kg   SpO2 91%   BMI 24.17 kg/m   Physical Exam Vitals and nursing note reviewed.  Constitutional:      General: She is not in acute distress.    Appearance: Normal appearance. She is normal weight. She is not ill-appearing.  HENT:     Head: Normocephalic and atraumatic.     Right Ear: External ear normal.     Left  Ear: External ear normal.     Nose: Nose normal. No congestion.     Mouth/Throat:     Mouth: Mucous membranes are moist.     Pharynx: Oropharynx is clear. No oropharyngeal exudate or posterior oropharyngeal erythema.  Eyes:     General: No visual field deficit.    Extraocular Movements: Extraocular movements intact.     Conjunctiva/sclera: Conjunctivae normal.     Pupils: Pupils are equal, round, and reactive to light.  Neck:     Vascular: No carotid bruit.  Cardiovascular:     Rate and Rhythm: Normal rate and regular rhythm.     Pulses: Normal pulses.     Heart sounds: Normal heart sounds. No murmur heard. Pulmonary:     Effort: Pulmonary effort is normal. No respiratory distress.     Breath sounds: Normal breath sounds. No stridor. No wheezing, rhonchi or rales.  Chest:     Chest wall: No tenderness.  Abdominal:     General: Bowel sounds are normal. There is no distension.     Palpations: Abdomen is soft.     Tenderness: There is no abdominal tenderness. There is no right CVA tenderness, left CVA tenderness, guarding or rebound.  Musculoskeletal:        General: No swelling or tenderness. Normal range of motion.     Cervical back: Normal range of motion and neck supple. No rigidity, tenderness or bony tenderness.     Thoracic back: Normal. No tenderness or bony tenderness.     Lumbar back: Normal. No tenderness or bony tenderness.       Back:     Right lower leg: No edema.     Left lower leg: No edema.  Skin:    General: Skin is warm and dry.     Coloration: Skin is not jaundiced.  Neurological:     General: No focal deficit present.     Mental Status: She is alert and oriented to person, place, and time. Mental status is at baseline.     Cranial Nerves: Cranial nerves are intact. No cranial nerve deficit, dysarthria or facial asymmetry.     Sensory: Sensation is intact. No sensory deficit.  Motor: Motor function is intact. No weakness.     Coordination: Coordination  is intact. Finger-Nose-Finger Test normal.     Gait: Gait is intact. Gait normal.  Psychiatric:        Mood and Affect: Mood normal.        Behavior: Behavior normal.        Thought Content: Thought content normal.        Judgment: Judgment normal.    ED Results / Procedures / Treatments   Labs (all labs ordered are listed, but only abnormal results are displayed) Labs Reviewed - No data to display  EKG None  Radiology No results found.  Procedures Procedures   Medications Ordered in ED Medications - No data to display  ED Course  I have reviewed the triage vital signs and the nursing notes.  Pertinent labs & imaging results that were available during my care of the patient were reviewed by me and considered in my medical decision making (see chart for details).    MDM Rules/Calculators/A&P                         Deborah Randall is a 80 y.o. female with a past medical history of hypertension, HLD, hypothyroidism, CKD that is presenting for right lower back pain. Patient is hemodynamically stable and in no acute distress. Patient is able to ambulate with assistance which is her baseline. She denies any midline spinal tenderness. She denies any urinary inconitnence, bowel incontinence, fevers, or chills. She denies any hematuria or dysuria. She denies any falls or trauma. Her pain was controlled with norco. Her BMP showed hypokalemia. She was given oral potassium in the ED. Her CXR showed no acute cardiac or pulmonary abnormality. UA showed no signs of infection. Lipase was negative. CT abd/pelvis ordered to evaluate for kidney stones, hydronephrosis, back injury, gall bladder disease.   Patient states compliance and understanding of the plan. I explained labs and imaging to the patient. No further questions at this time from the patient.  Plan at signout: Follow up CT scan and reevaluate patient.   Patient was signed out to oncoming provider. Please see there documentation for the  rest of ED encounter.   The plan for this patient was discussed with Dr. Reather Converse, who voiced agreement and who oversaw evaluation and treatment of this patient.    Final Clinical Impression(s) / ED Diagnoses Final diagnoses:  None    Rx / DC Orders ED Discharge Orders     None        Doretha Sou, MD 01/14/21 2103    Elnora Morrison, MD 01/15/21 2352

## 2021-01-14 NOTE — ED Triage Notes (Signed)
Pt c/o R sided back pain ongoing for two months. States the pain was exacerbated on Wednesday without cause. Denies limb numbness, no loss of bowels/bladder.

## 2021-01-18 ENCOUNTER — Encounter: Payer: Self-pay | Admitting: Orthopedic Surgery

## 2021-01-18 ENCOUNTER — Other Ambulatory Visit: Payer: Self-pay

## 2021-01-18 ENCOUNTER — Ambulatory Visit (INDEPENDENT_AMBULATORY_CARE_PROVIDER_SITE_OTHER): Payer: Medicare HMO | Admitting: Orthopedic Surgery

## 2021-01-18 VITALS — BP 138/88 | HR 79 | Temp 98.0°F | Resp 16 | Ht 64.5 in | Wt 145.6 lb

## 2021-01-18 DIAGNOSIS — M545 Low back pain, unspecified: Secondary | ICD-10-CM | POA: Diagnosis not present

## 2021-01-18 DIAGNOSIS — R9389 Abnormal findings on diagnostic imaging of other specified body structures: Secondary | ICD-10-CM

## 2021-01-18 MED ORDER — GABAPENTIN 100 MG PO CAPS: 100.0000 mg | ORAL_CAPSULE | Freq: Every day | ORAL | 1 refills | Status: DC

## 2021-01-18 NOTE — Patient Instructions (Signed)
PT referral made  Gabapentin prescribed for nerve pain- it will take a few days to work, may double the dose in 7 days if it is helping- this medication may cause drowsiness  Continue light walking- avoid bending/twisting or pulling movements  Contact Dr. Debara Pickett about scheduling MRI  Tylenol 1000 mg 1-3 times daily

## 2021-01-18 NOTE — Progress Notes (Signed)
Careteam: Patient Care Team: Ngetich, Nelda Bucks, NP as PCP - General (Family Medicine) Croitoru, Dani Gobble, MD as Consulting Physician (Cardiology) [place]ique, Yahsir Wickens, MD as Consulting Physician (Dermatology)  Seen by: Windell Moulding, AGNP-C  PLACE OF SERVICE:  [place] Directive information Does Patient Have a Medical Advance Directive?: No, Would patient like information on creating a medical advance directive?: No - Patient declined  No Known Allergies  Chief Complaint  Patient presents with   Follow-up    Patient complains of continuous back and side pain.     HPI: Patient is a 80 y.o. female seen today for acute visit due to ongoing back and side pain.   08/26 she presented to the ED with low back pain and right sided pain. CXR negative for active cardiopulmonary disease, moderate- large hiatal hernia reported. CT abdomen/pelvis revealed compression fracture of L3 vertebral body superior endplate with [id number] vertebral height loss. Urine analysis unremarkable. No evidence of kidney stones. She reports onset began June 2022. 07/05 she was seen by Dr. Junius Roads, she was given hydrocodone and advised to follow up with PCP due to concerning melena/GI bleed. She has been taking tylenol 500 mg prn for pain. She has also taken advil 400 mg only a few times. Treatment options discussed with patient. At this time she is interested in starting PT and scheduling follow up with Dr. Junius Roads.  She denies chest pain, sob, sciatica, numbness or loss of bowel/bladder.     Review of Systems:  Review of Systems  Constitutional:  Negative for chills, fever, malaise/fatigue and weight loss.  Respiratory:  Negative for cough, shortness of breath and wheezing.   Cardiovascular:  Negative for chest pain and leg swelling.  Genitourinary:  Negative for dysuria, flank pain and frequency.  Musculoskeletal:  Positive for back pain. Negative for falls, myalgias and neck pain.  Neurological:  Negative for dizziness,  tingling and weakness.  Psychiatric/Behavioral:  Negative for depression. The patient is not nervous/anxious.    Past Medical History:  Diagnosis Date   Anxiety    Atrial fibrillation with rapid ventricular response ([place]) 07/07/2015   Bilateral breast cancer (HCC)    Daily headache    Depression    GERD (gastroesophageal reflux disease)    Hyperlipidemia    Hypertension    Hypothyroidism    Iron deficiency anemia 2000s   Lumbago    Nausea alone    Senile osteoporosis    Squamous cell carcinoma of forehead    "I think they burned it off"   Past Surgical History:  Procedure Laterality Date   ABDOMINAL HYSTERECTOMY  1970s   BREAST BIOPSY Bilateral    BREAST LUMPECTOMY Right 2002   "w/lymph nodes removed"   CARDIOVERSION N/A 08/19/2015   Procedure: CARDIOVERSION;  Surgeon: Sanda Klein, MD;  Location: [place];  Service: Cardiovascular;  Laterality: N/A;   CATARACT EXTRACTION W/ INTRAOCULAR LENS  IMPLANT, BILATERAL Bilateral 2000s   DILATION AND CURETTAGE OF UTERUS  ~ [place] Left 2011   "has 2 screws in it"   EXCISIONAL HEMORRHOIDECTOMY  ~ [place]  02/12/2016   Right Wrist   HEMIARTHROPLASTY HIP Left 2011   "had ball replaced"   INGUINAL HERNIA REPAIR Right 1980s?   MASTECTOMY Left 2005   ORIF WRIST FRACTURE Left 02/12/2016   Procedure: OPEN REDUCTION INTERNAL FIXATION LEFT DISTAL RADIUS;  Surgeon: Dayna Barker, MD;  Location: [place];  Service: Plastics;  Laterality: Left;  TOTAL THYROIDECTOMY  1972   WRIST FRACTURE SURGERY Right    "has a plate and 7 screws in there"   Social History:   reports that she has never smoked. She has never used smokeless tobacco. She reports that she does not drink alcohol and does not use drugs.  Family History  Problem Relation Age of Onset   Cancer Father    Heart attack Brother    Mental illness Son    Other Brother    Other Brother    Cancer Sister    Heart disease Sister     Hypertension Sister    Other Brother    Alzheimer's disease Brother    Diabetes Brother    COPD Brother    Mental illness Son     Medications: Patient's Medications  New Prescriptions   No medications on file  Previous Medications   ACETAMINOPHEN (TYLENOL) 325 MG TABLET    Take 2 tablets (650 mg total) by mouth every 4 (four) hours as needed for headache or mild pain.   ALBUTEROL (VENTOLIN HFA) 108 (90 BASE) MCG/ACT INHALER    Inhale 1 puff into the lungs 3 (three) times daily as needed for shortness of breath.   ALPRAZOLAM (XANAX) 0.5 MG TABLET    TAKE 1 TABLET BY MOUTH DAILY FOR ANXIETY   AMLODIPINE (NORVASC) 2.5 MG TABLET    Take 1 tablet (2.5 mg total) by mouth daily.   CHOLECALCIFEROL (VITAMIN D3) 2000 UNITS TABS    Take 1 tablet by mouth daily.    ELIQUIS 5 MG TABS TABLET    TAKE 1 TABLET BY MOUTH TWICE DAILY   FUROSEMIDE (LASIX) 40 MG TABLET    TAKE 1 TABLET BY MOUTH DAILY   IBUPROFEN (ADVIL) 200 MG TABLET    Take 200 mg by mouth daily as needed for headache or mild pain.   LEVOTHYROXINE (SYNTHROID) 137 MCG TABLET    TAKE 1 TABLET BY MOUTH DAILY 30 MINUTES BEFORE BREAKFAST   MECLIZINE (ANTIVERT) 12.5 MG TABLET    Take 12.5 mg by mouth as needed for dizziness (OTC for dizziness).   METOPROLOL TARTRATE (LOPRESSOR) 50 MG TABLET    Take 1 tablet (50 mg total) by mouth 2 (two) times daily.   OXYCODONE (ROXICODONE) 5 MG IMMEDIATE RELEASE TABLET    Take 1 tablet (5 mg total) by mouth every 4 (four) hours as needed for severe pain.   POTASSIUM CHLORIDE SA (KLOR-CON) 20 MEQ TABLET    Take 2 tablets (40 mEq total) by mouth daily.   ROSUVASTATIN (CRESTOR) 10 MG TABLET    TAKE 1 TABLET BY MOUTH DAILY   SERTRALINE (ZOLOFT) 25 MG TABLET    Take 2 tablets (50 mg total) by mouth daily.  Modified Medications   No medications on file  Discontinued Medications   No medications on file    Physical Exam:  There were no vitals filed for this visit. There is no height or weight on file to  calculate BMI. Wt Readings from Last 3 Encounters:  01/14/21 143 lb (64.9 kg)  09/27/20 148 lb 12.8 oz (67.5 kg)  08/18/20 152 lb 6.4 oz (69.1 kg)    Physical Exam Vitals reviewed.  Constitutional:      General: She is not in acute distress. HENT:     Head: Normocephalic.  Cardiovascular:     Rate and Rhythm: Normal rate. Rhythm irregular.     Pulses: Normal pulses.     Heart sounds: Normal heart sounds. No murmur  heard. Pulmonary:     Effort: Pulmonary effort is normal. No respiratory distress.     Breath sounds: Normal breath sounds. No wheezing.  Musculoskeletal:     Cervical back: Normal.     Thoracic back: Normal.     Lumbar back: No swelling, deformity or tenderness. Normal range of motion.     Right lower leg: No edema.     Left lower leg: No edema.     Comments: Mild kyphosis  Skin:    General: Skin is warm and dry.     Capillary Refill: Capillary refill takes less than 2 seconds.  Neurological:     General: No focal deficit present.     Mental Status: She is alert and oriented to person, place, and time. Mental status is at baseline.  Psychiatric:        Mood and Affect: Mood normal.        Behavior: Behavior normal.    Labs reviewed: Basic Metabolic Panel: Recent Labs    01/19/20 1120 09/24/20 1155 01/14/21 1142  NA 141 142 141  K 4.5 3.5 2.8*  CL 103 101 100  CO2 31 31 32  GLUCOSE 101* 90 105*  BUN '12 19 11  '$ CREATININE 1.02* 1.28* 1.06*  CALCIUM 9.6 9.0 8.9  TSH  --  0.21*  --    Liver Function Tests: Recent Labs    09/24/20 1155 01/14/21 1142  AST 23 26  ALT 15 19  ALKPHOS  --  82  BILITOT 0.8 1.0  PROT 6.6 6.6  ALBUMIN  --  3.5   Recent Labs    01/14/21 1142  LIPASE 34   No results for input(s): AMMONIA in the last 8760 hours. CBC: Recent Labs    09/24/20 1155 11/23/20 1057 01/14/21 1142  WBC 5.8 9.0 9.2  NEUTROABS 3,277 5,184 6.2  HGB 11.1* 10.4* 11.6*  HCT 36.4 35.0 38.1  MCV 84.8 85.0 82.8  PLT 152 165 141*   Lipid  Panel: No results for input(s): CHOL, HDL, LDLCALC, TRIG, CHOLHDL, LDLDIRECT in the last 8760 hours. TSH: Recent Labs    09/24/20 1155  TSH 0.21*   A1C: Lab Results  Component Value Date   HGBA1C 6.0 (H) 09/15/2019     Assessment/Plan 1. Acute right-sided low back pain without sciatica - onset June 2022 - 08/26 ED visit due to worsening pain - CXR unremarkable - CT abdomen suggested L3 interval compression fracture and 25% vertebral body height - gabapentin (NEURONTIN) 100 MG capsule; Take 1 capsule (100 mg total) by mouth at bedtime.  Dispense: 60 capsule; Refill: 1 - cont tylenol 1000 mg 1-3x daily - advised to avoid daily Advil use - recommend scheduling appointment with Dr. Junius Roads  - Ambulatory referral to Physical Therapy  2. Abnormal CT scan - 2.2 cm hypo attenuating lesion at tail of pancreas- f/u MRCP recommended on    non-emergent basis - probable focal adenomyomatosis at gallbladder neck also noted- MRCP recommended - results discussed with patient, she reports she will discuss with PCP in future  Total time: 23 minutes. Greater than 50% of total time spent doing patient education on symptom management and medication management regarding back pain.   Next appt: Visit date not found  [place], [facility] [phone]

## 2021-01-26 ENCOUNTER — Other Ambulatory Visit: Payer: Self-pay | Admitting: Family

## 2021-01-26 ENCOUNTER — Telehealth: Payer: Self-pay | Admitting: *Deleted

## 2021-01-26 DIAGNOSIS — F419 Anxiety disorder, unspecified: Secondary | ICD-10-CM

## 2021-01-26 NOTE — Telephone Encounter (Signed)
I would recommend seeing the specialist/ Dr. Junius Roads for further recommendation. If her pain is that severe, I would recommend presenting to the ED for further evaluation.

## 2021-01-26 NOTE — Telephone Encounter (Signed)
RX last refilled in May 2022, # 30/0 refills, and no treatment agreement on file. Notation made on pending appointment in November 2022 to sign treatment agreement

## 2021-01-26 NOTE — Telephone Encounter (Signed)
Tried calling patient. Voicemail not set up and cannot leave message.   Forwarded to [employer] for [place] (PCP)

## 2021-01-26 NOTE — Telephone Encounter (Signed)
Patient called and stated that she was seen on 8/30 for right sided back pain and given Gabapentin. Patient stated that this is not helping and she still has a pain level of 10/10.   Patient stated that she is also still taking the Tylenol with no relief.  She cannot schedule with Dr. Junius Roads or PT because of the pain level and her son is out of town and she has no transportation to appointments.   Patient is requesting pain medication.  Please Advise.

## 2021-01-26 NOTE — Telephone Encounter (Signed)
I agree with Elodia Florence.

## 2021-01-27 NOTE — Telephone Encounter (Signed)
Tried calling patient. Voicemail not set up and cannot leave message.

## 2021-01-27 NOTE — Telephone Encounter (Signed)
Called patient, no answer, and no voicemail. We will need to try again later

## 2021-01-28 NOTE — Telephone Encounter (Signed)
Tried calling patient. Voicemail not set up and cannot leave message.

## 2021-02-02 ENCOUNTER — Other Ambulatory Visit: Payer: Self-pay | Admitting: Family

## 2021-02-04 ENCOUNTER — Encounter: Payer: Self-pay | Admitting: Adult Health

## 2021-02-04 ENCOUNTER — Other Ambulatory Visit: Payer: Self-pay

## 2021-02-04 ENCOUNTER — Ambulatory Visit (INDEPENDENT_AMBULATORY_CARE_PROVIDER_SITE_OTHER): Payer: Medicare HMO | Admitting: Adult Health

## 2021-02-04 VITALS — BP 142/88 | HR 77 | Temp 97.7°F | Wt 150.2 lb

## 2021-02-04 DIAGNOSIS — I10 Essential (primary) hypertension: Secondary | ICD-10-CM

## 2021-02-04 DIAGNOSIS — R262 Difficulty in walking, not elsewhere classified: Secondary | ICD-10-CM | POA: Diagnosis not present

## 2021-02-04 DIAGNOSIS — R109 Unspecified abdominal pain: Secondary | ICD-10-CM | POA: Diagnosis not present

## 2021-02-04 DIAGNOSIS — R9389 Abnormal findings on diagnostic imaging of other specified body structures: Secondary | ICD-10-CM

## 2021-02-04 DIAGNOSIS — E876 Hypokalemia: Secondary | ICD-10-CM

## 2021-02-04 MED ORDER — OXYCODONE HCL 5 MG PO TABS: 5.0000 mg | ORAL_TABLET | Freq: Four times a day (QID) | ORAL | 0 refills | Status: DC | PRN

## 2021-02-04 NOTE — Patient Instructions (Addendum)
Hypokalemia Hypokalemia means that the amount of potassium in the blood is lower than normal. Potassium is a chemical (electrolyte) that helps regulate the amount of fluid in the body. It also stimulates muscle tightening (contraction) and helps nerves work properly. Normally, most of the body's potassium is inside cells, and only a very small amount is in the blood. Because the amount in the blood is so small, minor changes to potassium levels in the blood can be life-threatening. What are the causes? This condition may be caused by: Antibiotic medicine. Diarrhea or vomiting. Taking too much of a medicine that helps you have a bowel movement (laxative) can cause diarrhea and lead to hypokalemia. Chronic kidney disease (CKD). Medicines that help the body get rid of excess fluid (diuretics). Eating disorders, such as bulimia. Low magnesium levels in the body. Sweating a lot. What are the signs or symptoms? Symptoms of this condition include: Weakness. Constipation. Fatigue. Muscle cramps. Mental confusion. Skipped heartbeats or irregular heartbeat (palpitations). Tingling or numbness. How is this diagnosed? This condition is diagnosed with a blood test. How is this treated? This condition may be treated by: Taking potassium supplements by mouth. Adjusting the medicines that you take. Eating more foods that contain a lot of potassium. If your potassium level is very low, you may need to get potassium through an IV and be monitored in the hospital. Follow these instructions at home:  Take over-the-counter and prescription medicines only as told by your health care provider. This includes vitamins and supplements. Eat a healthy diet. A healthy diet includes fresh fruits and vegetables, whole grains, healthy fats, and lean proteins. If instructed, eat more foods that contain a lot of potassium. This includes: Nuts, such as peanuts and pistachios. Seeds, such as sunflower seeds and  pumpkin seeds. Peas, lentils, and lima beans. Whole grain and bran cereals and breads. Fresh fruits and vegetables, such as apricots, avocado, bananas, cantaloupe, kiwi, oranges, tomatoes, asparagus, and potatoes. Orange juice. Tomato juice. Red meats. Yogurt. Keep all follow-up visits as told by your health care provider. This is important. Contact a health care provider if you: Have weakness that gets worse. Feel your heart pounding or racing. Vomit. Have diarrhea. Have diabetes (diabetes mellitus) and you have trouble keeping your blood sugar (glucose) in your target range. Get help right away if you: Have chest pain. Have shortness of breath. Have vomiting or diarrhea that lasts for more than 2 days. Faint. Summary Hypokalemia means that the amount of potassium in the blood is lower than normal. This condition is diagnosed with a blood test. Hypokalemia may be treated by taking potassium supplements, adjusting the medicines that you take, or eating more foods that are high in potassium. If your potassium level is very low, you may need to get potassium through an IV and be monitored in the hospital. This information is not intended to replace advice given to you by your health care provider. Make sure you discuss any questions you have with your health care provider. Document Revised: 12/18/2017 Document Reviewed: 12/19/2017 Elsevier Patient Education  2022 [employer] Inc.  

## 2021-02-04 NOTE — Progress Notes (Signed)
[facility]  Provider: Durenda Age  DNP  Code Status:  Full Code  Goals of Care:  Advanced Directives 01/18/2021  Does Patient Have a Medical Advance Directive? No  Type of Advance Directive -  Does patient want to make changes to medical advance directive? -  Copy of [place] in Chart? -  Would patient like information on creating a medical advance directive? No - Patient declined     Chief Complaint  Patient presents with   Acute Visit    Patient presents today for right side pain. She reports nausea and bloating.    HPI: Patient is a 80 y.o. female seen today for an acute visit for right-sided pain, nausea and bloating. She stated that this started 2.5 months ago. Of note, she was seen in the ED on 01/14/21 for right lower back pain. Imaging showed a new L3 superior endplate compression fracture with around 25% height loss. Also, she has a focal muscular abnormality of the gallbladder and a likely IPMN of the pancreas. It was thought that her pain is most consistent with sprain/strain injury. She was started on Oxycodone 5 mg Q 4 hours PRN. She was noted to have K 2.8 . She is currently on KCL 40 meq daily. She takes Lasix 40 mg daily for CHF.  She stated that her pain is getting worse, now having nausea and bloating. She uses a cane when she walks. She is requesting for a disability parking placard. BP today 140/88. She does not regularly check her BP at home. She takes  Amlodipine 2.5 mg daily and Metoprolol tartrate 50 mg BID for hypertension.   Past Medical History:  Diagnosis Date   Anxiety    Atrial fibrillation with rapid ventricular response ([place]) 07/07/2015   Bilateral breast cancer (HCC)    Daily headache    Depression    GERD (gastroesophageal reflux disease)    Hyperlipidemia    Hypertension    Hypothyroidism    Iron deficiency anemia 2000s   Lumbago    Nausea alone    Senile osteoporosis    Squamous cell carcinoma of forehead     "I think they burned it off"    Past Surgical History:  Procedure Laterality Date   ABDOMINAL HYSTERECTOMY  1970s   BREAST BIOPSY Bilateral    BREAST LUMPECTOMY Right 2002   "w/lymph nodes removed"   CARDIOVERSION N/A 08/19/2015   Procedure: CARDIOVERSION;  Surgeon: Sanda Klein, MD;  Location: [place];  Service: Cardiovascular;  Laterality: N/A;   CATARACT EXTRACTION W/ INTRAOCULAR LENS  IMPLANT, BILATERAL Bilateral 2000s   DILATION AND CURETTAGE OF UTERUS  ~ [place] Left 2011   "has 2 screws in it"   EXCISIONAL HEMORRHOIDECTOMY  ~ [place]  02/12/2016   Right Wrist   HEMIARTHROPLASTY HIP Left 2011   "had ball replaced"   INGUINAL HERNIA REPAIR Right 1980s?   MASTECTOMY Left 2005   ORIF WRIST FRACTURE Left 02/12/2016   Procedure: OPEN REDUCTION INTERNAL FIXATION LEFT DISTAL RADIUS;  Surgeon: Dayna Barker, MD;  Location: [place];  Service: Plastics;  Laterality: Left;   TOTAL THYROIDECTOMY  1972   WRIST FRACTURE SURGERY Right    "has a plate and 7 screws in there"    No Known Allergies  Outpatient Encounter Medications as of 02/04/2021  Medication Sig   acetaminophen (TYLENOL) 325 MG tablet Take 2 tablets (650 mg total) by mouth every 4 (four) hours  as needed for headache or mild pain.   ALPRAZolam (XANAX) 0.5 MG tablet TAKE 1 TABLET BY MOUTH DAILY FOR ANXIETY   amLODipine (NORVASC) 2.5 MG tablet Take 1 tablet (2.5 mg total) by mouth daily.   Cholecalciferol (VITAMIN D3) 2000 UNITS TABS Take 1 tablet by mouth daily.    ELIQUIS 5 MG TABS tablet TAKE 1 TABLET BY MOUTH TWICE DAILY   furosemide (LASIX) 40 MG tablet TAKE 1 TABLET BY MOUTH DAILY   gabapentin (NEURONTIN) 100 MG capsule Take 1 capsule (100 mg total) by mouth at bedtime.   ibuprofen (ADVIL) 200 MG tablet Take 200 mg by mouth daily as needed for headache or mild pain.   levothyroxine (SYNTHROID) 137 MCG tablet TAKE 1 TABLET BY MOUTH DAILY 30 MINUTES BEFORE BREAKFAST   meclizine  (ANTIVERT) 12.5 MG tablet Take 12.5 mg by mouth as needed for dizziness (OTC for dizziness).   metoprolol tartrate (LOPRESSOR) 50 MG tablet Take 1 tablet (50 mg total) by mouth 2 (two) times daily.   oxyCODONE (ROXICODONE) 5 MG immediate release tablet Take 1 tablet (5 mg total) by mouth every 4 (four) hours as needed for severe pain.   potassium chloride SA (KLOR-CON) 20 MEQ tablet Take 2 tablets (40 mEq total) by mouth daily.   rosuvastatin (CRESTOR) 10 MG tablet TAKE 1 TABLET BY MOUTH DAILY   sertraline (ZOLOFT) 25 MG tablet Take 2 tablets (50 mg total) by mouth daily.   albuterol (VENTOLIN HFA) 108 (90 Base) MCG/ACT inhaler Inhale 1 puff into the lungs 3 (three) times daily as needed for shortness of breath.   No facility-administered encounter medications on file as of 02/04/2021.    Review of Systems:  Review of Systems  Constitutional:  Positive for appetite change and chills. Negative for diaphoresis and fever.  HENT:  Negative for congestion.   Eyes: Negative.   Respiratory:  Negative for cough.    Health Maintenance  Topic Date Due   COVID-19 Vaccine (4 - Booster for Pfizer series) 10/29/2020   INFLUENZA VACCINE  12/20/2020   TETANUS/TDAP  10/23/2027   DEXA SCAN  Completed   Zoster Vaccines- Shingrix  Completed   HPV VACCINES  Aged Out    Physical Exam: Vitals:   02/04/21 1554  BP: (!) 142/88  Pulse: 77  Temp: 97.7 F (36.5 C)  SpO2: 97%  Weight: 150 lb 3.2 oz (68.1 kg)   Body mass index is 25.38 kg/m. Physical Exam HENT:     Head: Normocephalic and atraumatic.     Nose: Nose normal.  Eyes:     Conjunctiva/sclera: Conjunctivae normal.  Cardiovascular:     Rate and Rhythm: Normal rate and regular rhythm.  Pulmonary:     Effort: Pulmonary effort is normal.     Breath sounds: Normal breath sounds.  Abdominal:     Palpations: Abdomen is soft.  Musculoskeletal:     Cervical back: Normal range of motion.  Neurological:     Mental Status: She is alert.    Labs reviewed: Basic Metabolic Panel: Recent Labs    09/24/20 1155 01/14/21 1142  NA 142 141  K 3.5 2.8*  CL 101 100  CO2 31 32  GLUCOSE 90 105*  BUN 19 11  CREATININE 1.28* 1.06*  CALCIUM 9.0 8.9  TSH 0.21*  --    Liver Function Tests: Recent Labs    09/24/20 1155 01/14/21 1142  AST 23 26  ALT 15 19  ALKPHOS  --  82  BILITOT 0.8 1.0  PROT 6.6 6.6  ALBUMIN  --  3.5   Recent Labs    01/14/21 1142  LIPASE 34   No results for input(s): AMMONIA in the last 8760 hours. CBC: Recent Labs    09/24/20 1155 11/23/20 1057 01/14/21 1142  WBC 5.8 9.0 9.2  NEUTROABS 3,277 5,184 6.2  HGB 11.1* 10.4* 11.6*  HCT 36.4 35.0 38.1  MCV 84.8 85.0 82.8  PLT 152 165 141*   Lipid Panel: No results for input(s): CHOL, HDL, LDLCALC, TRIG, CHOLHDL, LDLDIRECT in the last 8760 hours. Lab Results  Component Value Date   HGBA1C 6.0 (H) 09/15/2019    Procedures since last visit: DG Chest 2 View  Result Date: 01/14/2021 CLINICAL DATA:  Right back and flank pain EXAM: CHEST - 2 VIEW COMPARISON:  02/11/2016 FINDINGS: Heart size appears mildly enlarged. Atherosclerotic calcification of the aortic knob. Moderate-large hiatal hernia. Chronically coarsened interstitial markings. No focal airspace consolidation, pleural effusion, or pneumothorax. IMPRESSION: 1. No active cardiopulmonary disease. 2. Moderate-large hiatal hernia. Electronically Signed   By: Davina Poke D.O.   On: 01/14/2021 12:25   CT ABDOMEN PELVIS W CONTRAST  Result Date: 01/14/2021 CLINICAL DATA:  Right low back pain radiating up to her right shoulder. Evaluate for stone, abscess. EXAM: CT ABDOMEN AND PELVIS WITH CONTRAST TECHNIQUE: Multidetector CT imaging of the abdomen and pelvis was performed using the standard protocol following bolus administration of intravenous contrast. CONTRAST:  141m OMNIPAQUE IOHEXOL 350 MG/ML SOLN COMPARISON:  None. FINDINGS: Lower chest: No acute abnormality. Subtle reticulation at the  lung bases. Subpleural cystic changes noted the anterolateral aspect of the right lower lobe. Hepatobiliary: No focal liver abnormality. Focal fluid density wall thickening at the neck of the gallbladder, which could represent focal adenomyomatosis (series 6, image 58; series 7, image 64). No gallbladder wall thickening or pericholecystic fluid. No bile duct dilatation. Pancreas: A 2.2 cm oval hypoechoic lesion is noted at the tail of the pancreas (series 3, image 17; series 6, image 54). No dilatation of the main pancreatic duct. Spleen: Normal in size without focal abnormality. Adrenals/Urinary Tract: Adrenal glands are unremarkable. Kidneys are normal, without renal calculi or hydronephrosis. A 0.5 cm partially exophytic round hypodense lesion noted at the lower pole of the left kidney (series 6, image 69). This is too small to characterize though likely represents a benign cyst. The bladder is unremarkable. Stomach/Bowel: Partially visualized large hiatal hernia with the majority of the stomach within the thoracic cavity. Normal appendix. Diverticulosis involving the ascending, descending, and sigmoid colon. No bowel wall thickening, obstruction, or adjacent inflammatory changes. Mild stool burden with majority of the stool in the ascending and transverse colon. Vascular/Lymphatic: Abdominal aortic calcific atherosclerosis. No abdominal aortic aneurysm. No enlarged abdominal or pelvic lymph nodes. Reproductive: Hysterectomy. Other: Small fat containing umbilical hernia.  Left mastectomy. Musculoskeletal: Interval compression fracture of the L3 vertebral body superior endplate with approximately 25% vertebral body height loss. No retropulsion. Left hip prosthesis this is intact and in appropriate position. IMPRESSION: 1. Since prior lumbar spine radiographs performed 11/15/2020, there has been an interval compression fracture of the L3 vertebral body superior endplate with approximately 25% vertebral height loss.  2. No acute abnormality in the abdomen or pelvis. 3. A 2.2 cm hypoattenuating lesion at the tail of the pancreas may represent an IPMN. Recommend further evaluation with MRCP on a non-emergent basis. 4. Probable focal adenomyomatosis at the gallbladder neck, which can also be evaluated on MRCP. 5. Diverticulosis of the ascending, descending, and  sigmoid colon without findings of diverticulitis. 6. Large hiatal hernia. 7.  Aortic Atherosclerosis (ICD10-I70.0). Electronically Signed   By: Ileana Roup M.D.   On: 01/14/2021 16:09    Assessment/Plan  1. Right-sided abdominal pain -  pain is getting worse, with nausea and bloating - has an abnormal CT on 01/14/21, has a focal muscular abnormality of the gallbladder and a likely IPMN of the pancrea - Ambulatory referral to Gastroenterology  2. Hypokalemia -  K 2.8 -  continue KCL 40 meq daily - BMP with eGFR([facility])  3. Essential hypertension, benign -  BP 140/88 -Continue taking amlodipine and metoprolol tartrate -   Monitor BPs and log daily so she can bring to [facility] on her next visit for evaluation -  will need to make an appointment sooner if her BPs are consistently elevated  4. Disability of walking -  form for Disability Parking Placard was filled up and handed to patient     Labs/tests ordered:  BMP with GFR   Next appt:  03/31/2021

## 2021-02-05 LAB — BASIC METABOLIC PANEL WITH GFR
BUN/Creatinine Ratio: 14 (calc) (ref 6–22)
BUN: 13 mg/dL (ref 7–25)
CO2: 25 mmol/L (ref 20–32)
Calcium: 9.1 mg/dL (ref 8.6–10.4)
Chloride: 105 mmol/L (ref 98–110)
Creat: 0.96 mg/dL — ABNORMAL HIGH (ref 0.60–0.95)
Glucose, Bld: 97 mg/dL (ref 65–139)
Potassium: 3.7 mmol/L (ref 3.5–5.3)
Sodium: 141 mmol/L (ref 135–146)
eGFR: 59 mL/min/{1.73_m2} — ABNORMAL LOW (ref >=60)

## 2021-02-05 NOTE — Progress Notes (Signed)
Her K+ 3.7, which is now within normal. She needs to continue taking her KCL as ordered ( KCL ER 20 meq take 2 tabs = 40 meq daily).

## 2021-02-07 ENCOUNTER — Other Ambulatory Visit: Payer: Self-pay

## 2021-02-07 DIAGNOSIS — E876 Hypokalemia: Secondary | ICD-10-CM

## 2021-02-07 MED ORDER — POTASSIUM CHLORIDE CRYS ER 20 MEQ PO TBCR: 40.0000 meq | EXTENDED_RELEASE_TABLET | Freq: Every day | ORAL | 3 refills | Status: DC

## 2021-02-09 ENCOUNTER — Ambulatory Visit: Payer: Medicare HMO | Admitting: Gastroenterology

## 2021-02-09 ENCOUNTER — Encounter: Payer: Self-pay | Admitting: Gastroenterology

## 2021-02-09 VITALS — BP 106/84 | HR 78 | Ht 64.0 in | Wt 143.0 lb

## 2021-02-09 DIAGNOSIS — D649 Anemia, unspecified: Secondary | ICD-10-CM

## 2021-02-09 DIAGNOSIS — R1011 Right upper quadrant pain: Secondary | ICD-10-CM

## 2021-02-09 DIAGNOSIS — R933 Abnormal findings on diagnostic imaging of other parts of digestive tract: Secondary | ICD-10-CM

## 2021-02-09 DIAGNOSIS — K862 Cyst of pancreas: Secondary | ICD-10-CM | POA: Diagnosis not present

## 2021-02-09 DIAGNOSIS — I700 Atherosclerosis of aorta: Secondary | ICD-10-CM | POA: Diagnosis not present

## 2021-02-09 DIAGNOSIS — K449 Diaphragmatic hernia without obstruction or gangrene: Secondary | ICD-10-CM

## 2021-02-09 NOTE — Progress Notes (Signed)
[facility] Consult Note:  History: Deborah Randall 02/09/2021  Referring provider: Sandrea Hughs, NP  Reason for consult/chief complaint: Right upper quandrant pain X 3 months and Constipation   Subjective  HPI:  Deborah Randall was accompanied by her son today and seen for abdominal pain and abnormal findings on GI imaging. Months ago she injured herself trying to fix something on her toilet, the seat came off and hit her on the right side and she fell.  Since then she has had unrelenting right-sided lower chest wall pain that is constant, worse with certain movements or taking a deep breath and often keeping her up at night.  It is limited her mobility and they are frustrated by the perceived lack of information and plan.  She was advised to go to PT but does not think that would be helpful. She tends to be constipated, no black or bloody stool.  Sometimes potassium tablets feel stuck in the neck, but otherwise no solid food dysphagia regurgitation or vomiting. She and her son were told that an MRI would be done for this pain but they do not understand why it has not been.  They also were told that Deborah Randall might need an upper endoscopy and a colonoscopy.  From 01/18/2021 visit at [facility] after an ED visit for back pain: "08/26 she presented to the ED with low back pain and right sided pain. CXR negative for active cardiopulmonary disease, moderate- large hiatal hernia reported. CT abdomen/pelvis revealed compression fracture of L3 vertebral body superior endplate with 29% vertebral height loss. Urine analysis unremarkable. No evidence of kidney stones. She reports onset began June 2022. 07/05 she was seen by Dr. Junius Roads, she was given hydrocodone and advised to follow up with PCP due to concerning melena/GI bleed. She has been taking tylenol 500 mg prn for pain. She has also taken advil 400 mg only a few times. Treatment options discussed with patient. At this time she is  interested in starting PT and scheduling follow up with Dr. Junius Roads. She denies chest pain, sob, sciatica, numbness or loss of bowel/bladder. "  From Dr. Junius Roads office note 11/23/20: "HPI: She is here with low back and right lower abdomen pain.  About a week ago she was lifting the lid off the back of the commode.  She felt immediate pain, and the lid started to slip and it hit her in the right lower abdominal area.  She has had severe pain since then, unable to sleep at night.  She has also had black stools for the last couple days.  She is on anticoagulants.  She has a history of osteoporosis, not currently being treated.  She took Fosamax in the past.  Last bone density test was 2019.  She had x-rays done recently which showed a questionable L3 compression fracture.  Patient states that she cannot lie flat.  She has to sit in a chair most of the time.  She denies any bright red blood per rectum.  She does have some troubles with mild shortness of breath.   Son says stool tested negative (patient brought samples to [facility] lab)  ROS:  Review of Systems  Constitutional:  Negative for appetite change and unexpected weight change.  HENT:  Negative for mouth sores and voice change.   Eyes:  Negative for pain and redness.  Respiratory:  Negative for cough and shortness of breath.   Cardiovascular:  Negative for chest pain and palpitations.  Genitourinary:  Negative for dysuria and hematuria.  Musculoskeletal:  Positive for arthralgias. Negative for myalgias.       Chest wall pain as noted above  Skin:  Negative for pallor and rash.  Neurological:  Negative for weakness and headaches.  Hematological:  Negative for adenopathy.  Psychiatric/Behavioral:         Depression and anxiety    Past Medical History: Past Medical History:  Diagnosis Date   Anxiety    Atrial fibrillation with rapid ventricular response ([place]) 07/07/2015   Bilateral breast cancer (HCC)    Daily headache    Depression    GERD  (gastroesophageal reflux disease)    Hyperlipidemia    Hypertension    Hypothyroidism    Iron deficiency anemia 2000s   Lumbago    Nausea alone    Senile osteoporosis    Squamous cell carcinoma of forehead    "I think they burned it off"     Past Surgical History: Past Surgical History:  Procedure Laterality Date   ABDOMINAL HYSTERECTOMY  1970s   BREAST BIOPSY Bilateral    BREAST LUMPECTOMY Right 2002   "w/lymph nodes removed"   CARDIOVERSION N/A 08/19/2015   Procedure: CARDIOVERSION;  Surgeon: Sanda Klein, MD;  Location: [facility];  Service: Cardiovascular;  Laterality: N/A;   CATARACT EXTRACTION W/ INTRAOCULAR LENS  IMPLANT, BILATERAL Bilateral 2000s   DILATION AND CURETTAGE OF UTERUS  ~ [place] Left 2011   "has 2 screws in it"   EXCISIONAL HEMORRHOIDECTOMY  ~ [place]  02/12/2016   Right Wrist   HEMIARTHROPLASTY HIP Left 2011   "had ball replaced"   INGUINAL HERNIA REPAIR Right 1980s?   MASTECTOMY Left 2005   ORIF WRIST FRACTURE Left 02/12/2016   Procedure: OPEN REDUCTION INTERNAL FIXATION LEFT DISTAL RADIUS;  Surgeon: Dayna Barker, MD;  Location: [facility];  Service: Plastics;  Laterality: Left;   TOTAL THYROIDECTOMY  1972   WRIST FRACTURE SURGERY Right    "has a plate and 7 screws in there"     Family History: Family History  Problem Relation Age of Onset   Cancer Father    Heart attack Brother    Mental illness Son    Other Brother    Other Brother    Cancer Sister    Heart disease Sister    Hypertension Sister    Other Brother    Alzheimer's disease Brother    Diabetes Brother    COPD Brother    Mental illness Son     Social History: 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    Tobacco Use   Smoking status: Never   Smokeless tobacco: Never  Vaping Use   Vaping Use: Never used  Substance and Sexual Activity   Alcohol use: No   Drug use: No   Sexual activity: Never  Other Topics Concern   Not on file  Social History Narrative   Lives alone   Widow   2 sons , one son decease, one son disable    Works at [employer] in [employer]   Enjoys going places with friends   Never smoked   Alcohol none   Exercise -walk, works in yard  Social Determinants of Health   Financial Resource Strain: Not on file  Food Insecurity: Not on file  Transportation Needs: Not on file  Physical Activity: Not on file  Stress: Not on file  Social Connections: Not on file    Allergies: No Known Allergies  Outpatient Meds: Current Outpatient Medications  Medication Sig Dispense Refill   acetaminophen (TYLENOL) 325 MG tablet Take 2 tablets (650 mg total) by mouth every 4 (four) hours as needed for headache or mild pain.     albuterol (VENTOLIN HFA) 108 (90 Base) MCG/ACT inhaler Inhale 1 puff into the lungs 3 (three) times daily as needed for shortness of breath.     ALPRAZolam (XANAX) 0.5 MG tablet TAKE 1 TABLET BY MOUTH DAILY FOR ANXIETY 30 tablet 1   amLODipine (NORVASC) 2.5 MG tablet Take 1 tablet (2.5 mg total) by mouth daily. 90 tablet 3   Cholecalciferol (VITAMIN D3) 2000 UNITS TABS Take 1 tablet by mouth daily.      ELIQUIS 5 MG TABS tablet TAKE 1 TABLET BY MOUTH TWICE DAILY 180 tablet 1   furosemide (LASIX) 40 MG tablet TAKE 1 TABLET BY MOUTH DAILY 90 tablet 1   gabapentin (NEURONTIN) 100 MG capsule Take 1 capsule (100 mg total) by mouth at bedtime. 60 capsule 1   ibuprofen (ADVIL) 200 MG tablet Take 200 mg by mouth daily as needed for headache or mild pain.     levothyroxine (SYNTHROID) 137 MCG tablet TAKE 1 TABLET BY MOUTH DAILY 30 MINUTES BEFORE BREAKFAST 90 tablet 1   meclizine (ANTIVERT) 12.5 MG tablet Take 12.5 mg by mouth as needed for dizziness (OTC  for dizziness).     metoprolol tartrate (LOPRESSOR) 50 MG tablet Take 1 tablet (50 mg total) by mouth 2 (two) times daily. 180 tablet 3   oxyCODONE (ROXICODONE) 5 MG immediate release tablet Take 1 tablet (5 mg total) by mouth every 6 (six) hours as needed for severe pain. 15 tablet 0   potassium chloride SA (KLOR-CON) 20 MEQ tablet Take 2 tablets (40 mEq total) by mouth daily. 180 tablet 3   rosuvastatin (CRESTOR) 10 MG tablet TAKE 1 TABLET BY MOUTH DAILY 90 tablet 0   sertraline (ZOLOFT) 25 MG tablet Take 2 tablets (50 mg total) by mouth daily. 60 tablet 3   No current facility-administered medications for this visit.      ___________________________________________________________________ Objective   Exam:  BP 106/84 (BP Location: Left Arm, Patient Position: Sitting, Cuff Size: Normal)   Pulse 78   Ht 5\' 4"  (1.626 m)   Wt 143 lb (64.9 kg)   SpO2 92%   BMI 24.55 kg/m  Wt Readings from Last 3 Encounters:  02/09/21 143 lb (64.9 kg)  02/04/21 150 lb 3.2 oz (68.1 kg)  01/18/21 145 lb 9.6 oz (66 kg)    General: Sitting in a wheelchair, visibly uncomfortable holding her right side. Gets up slowly with assistance, cannot get on the exam table due to pain and limited mobility. Eyes: sclera anicteric, no redness ENT: oral mucosa moist without lesions, no cervical or supraclavicular lymphadenopathy CV: RRR without murmur, S1/S2, no JVD, no peripheral edema Resp: clear to auscultation bilaterally, normal RR and effort noted GI: soft, right lateral and anterior lower chest wall tenderness , with active bowel sounds. No guarding or palpable organomegaly noted.  Limited abdominal exam due to positioning.  No right upper quadrant abdominal tenderness Skin; warm and dry, no rash or jaundice noted Neuro: awake, alert and oriented x 3.  Normal gross motor function and fluent speech  Labs:  CBC Latest Ref Rng & Units 01/14/2021 11/23/2020 09/24/2020  WBC 4.0 - 10.5 K/uL 9.2 9.0 5.8  Hemoglobin  12.0 - 15.0 g/dL 11.6(L) 10.4(L) 11.1(L)  Hematocrit 36.0 - 46.0 % 38.1 35.0 36.4  Platelets 150 - 400 K/uL 141(L) 165 152   CMP Latest Ref Rng & Units 02/04/2021 01/14/2021 09/24/2020  Glucose 65 - 139 mg/dL 97 105(H) 90  BUN 7 - 25 mg/dL 13 11 19   Creatinine 0.60 - 0.95 mg/dL 0.96(H) 1.06(H) 1.28(H)  Sodium 135 - 146 mmol/L 141 141 142  Potassium 3.5 - 5.3 mmol/L 3.7 2.8(L) 3.5  Chloride 98 - 110 mmol/L 105 100 101  CO2 20 - 32 mmol/L 25 32 31  Calcium 8.6 - 10.4 mg/dL 9.1 8.9 9.0  Total Protein 6.5 - 8.1 g/dL - 6.6 6.6  Total Bilirubin 0.3 - 1.2 mg/dL - 1.0 0.8  Alkaline Phos 38 - 126 U/L - 82 -  AST 15 - 41 U/L - 26 23  ALT 0 - 44 U/L - 19 15   Normal MCV No iron , V37 or folic acid levels  Radiologic Studies:  CLINICAL DATA:  Right low back pain radiating up to her right shoulder. Evaluate for stone, abscess.   EXAM: CT ABDOMEN AND PELVIS WITH CONTRAST   TECHNIQUE: Multidetector CT imaging of the abdomen and pelvis was performed using the standard protocol following bolus administration of intravenous contrast.   CONTRAST:  139mL OMNIPAQUE IOHEXOL 350 MG/ML SOLN   COMPARISON:  None.   FINDINGS: Lower chest: No acute abnormality. Subtle reticulation at the lung bases. Subpleural cystic changes noted the anterolateral aspect of the right lower lobe.   Hepatobiliary: No focal liver abnormality. Focal fluid density wall thickening at the neck of the gallbladder, which could represent focal adenomyomatosis (series 6, image 58; series 7, image 64). No gallbladder wall thickening or pericholecystic fluid. No bile duct dilatation.   Pancreas: A 2.2 cm oval hypoechoic lesion is noted at the tail of the pancreas (series 3, image 17; series 6, image 54). No dilatation of the main pancreatic duct.   Spleen: Normal in size without focal abnormality.   Adrenals/Urinary Tract: Adrenal glands are unremarkable. Kidneys are normal, without renal calculi or hydronephrosis. A  0.5 cm partially exophytic round hypodense lesion noted at the lower pole of the left kidney (series 6, image 69). This is too small to characterize though likely represents a benign cyst. The bladder is unremarkable.   Stomach/Bowel: Partially visualized large hiatal hernia with the majority of the stomach within the thoracic cavity. Normal appendix. Diverticulosis involving the ascending, descending, and sigmoid colon. No bowel wall thickening, obstruction, or adjacent inflammatory changes. Mild stool burden with majority of the stool in the ascending and transverse colon.   Vascular/Lymphatic: Abdominal aortic calcific atherosclerosis. No abdominal aortic aneurysm. No enlarged abdominal or pelvic lymph nodes.   Reproductive: Hysterectomy.   Other: Small fat containing umbilical hernia.  Left mastectomy.   Musculoskeletal: Interval compression fracture of the L3 vertebral body superior endplate with approximately 25% vertebral body height loss. No retropulsion. Left hip prosthesis this is intact and in appropriate position.   IMPRESSION: 1. Since prior lumbar spine radiographs performed 11/15/2020, there has been an interval compression fracture of the L3 vertebral body superior endplate with approximately 25% vertebral height loss. 2. No acute abnormality in the abdomen or pelvis. 3. A 2.2 cm hypoattenuating lesion at the tail of the pancreas may represent  an IPMN. Recommend further evaluation with MRCP on a non-emergent basis. 4. Probable focal adenomyomatosis at the gallbladder neck, which can also be evaluated on MRCP. 5. Diverticulosis of the ascending, descending, and sigmoid colon without findings of diverticulitis. 6. Large hiatal hernia. 7.  Aortic Atherosclerosis (ICD10-I70.0).     Electronically Signed   By: Ileana Roup M.D.   On: 01/14/2021 16:09 _______________________________   May 2021 echocardiogram report  1. Left ventricular ejection fraction, by  estimation, is 55 to 60%. The  left ventricle has normal function. The left ventricle has no regional  wall motion abnormalities. Left ventricular diastolic function could not  be evaluated.   2. Right ventricular systolic function is normal. The right ventricular  size is normal. There is normal pulmonary artery systolic pressure. The  estimated right ventricular systolic pressure is 54.6 mmHg.   3. Left atrial size was severely dilated.   4. Right atrial size was moderately dilated.   5. The mitral valve is grossly normal. Mild to moderate mitral valve  regurgitation. No evidence of mitral stenosis.   6. Tricuspid valve regurgitation is mild to moderate.   7. The aortic valve is tricuspid. Aortic valve regurgitation is not  visualized. Mild to moderate aortic valve sclerosis/calcification is  present, without any evidence of aortic stenosis.   8. The inferior vena cava is normal in size with greater than 50%  respiratory variability, suggesting right atrial pressure of 3 mmHg    Assessment: Encounter Diagnoses  Name Primary?   RUQ pain Yes   Pancreatic cyst    Hiatal hernia    This patient has persistent right-sided chest wall pain after an injury, it is not digestive in nature.  I do not know why it has lasted this long, question rib fracture or intercostal muscle strain/tear. The hiatal hernia, gallbladder and pancreatic findings are all incidental and unrelated to her pain.  She has pill dysphagia but otherwise seems to be asymptomatic from her hiatal hernia and I do not think endoscopic investigation is warranted at this point. Normocytic anemia, needs further work-up with primary care to check iron levels B12 and folate.  Therefore no current indication for endoscopic work-up of anemia.  While an MRI abdomen/MRCP are reasonable to evaluate the gallbladder and pancreatic findings in greater detail, those findings are likely benign and MRI imaging should wait until this patient has  significant improvement in her chest wall pain.  MRI is significantly subjective motion degradation, she currently cannot lay still or periodically breath-hold to get a good-quality MRI abdomen.   Plan: Campbell is referred back to primary care for further attention to her chest wall pain and anemia.  After that, when it is felt that she is able to have MRI noted above, this can also be arranged by primary care and patient referred back to [place] as needed.  Thank you for the courtesy of this consult.  Please call me with any questions or concerns.  Nelida Meuse III  CC: Referring provider noted above

## 2021-02-09 NOTE — Patient Instructions (Signed)
If you are age 81 or older, your body mass index should be between 23-30. Your Body mass index is 24.55 kg/m. If this is out of the aforementioned range listed, please consider follow up with your Primary Care Provider.  If you are age 76 or younger, your body mass index should be between 19-25. Your Body mass index is 24.55 kg/m. If this is out of the aformentioned range listed, please consider follow up with your Primary Care Provider.   __________________________________________________________  The [place] [facility] providers would like to encourage you to use [facility] to communicate with providers for non-urgent requests or questions.  Due to long hold times on the telephone, sending your provider a message by [facility] may be a faster and more efficient way to get a response.  Please allow 48 business hours for a response.  Please remember that this is for non-urgent requests.   It was a pleasure to see you today!  Thank you for trusting me with your gastrointestinal care!

## 2021-02-19 ENCOUNTER — Other Ambulatory Visit: Payer: Self-pay | Admitting: Family

## 2021-02-19 ENCOUNTER — Other Ambulatory Visit: Payer: Self-pay | Admitting: Nurse Practitioner

## 2021-02-19 DIAGNOSIS — I509 Heart failure, unspecified: Secondary | ICD-10-CM

## 2021-02-19 DIAGNOSIS — F3289 Other specified depressive episodes: Secondary | ICD-10-CM

## 2021-02-19 DIAGNOSIS — R0609 Other forms of dyspnea: Secondary | ICD-10-CM

## 2021-03-25 ENCOUNTER — Other Ambulatory Visit: Payer: Self-pay | Admitting: Family

## 2021-03-25 DIAGNOSIS — E038 Other specified hypothyroidism: Secondary | ICD-10-CM

## 2021-03-31 ENCOUNTER — Encounter: Payer: Self-pay | Admitting: Family

## 2021-03-31 ENCOUNTER — Other Ambulatory Visit: Payer: Self-pay

## 2021-03-31 ENCOUNTER — Ambulatory Visit (INDEPENDENT_AMBULATORY_CARE_PROVIDER_SITE_OTHER): Payer: Medicare HMO | Admitting: Family

## 2021-03-31 VITALS — BP 124/88 | HR 65 | Temp 98.0°F | Resp 16 | Ht 64.0 in | Wt 143.0 lb

## 2021-03-31 DIAGNOSIS — Z23 Encounter for immunization: Secondary | ICD-10-CM | POA: Diagnosis not present

## 2021-03-31 DIAGNOSIS — I872 Venous insufficiency (chronic) (peripheral): Secondary | ICD-10-CM

## 2021-03-31 DIAGNOSIS — E039 Hypothyroidism, unspecified: Secondary | ICD-10-CM | POA: Diagnosis not present

## 2021-03-31 DIAGNOSIS — F3341 Major depressive disorder, recurrent, in partial remission: Secondary | ICD-10-CM | POA: Diagnosis not present

## 2021-03-31 DIAGNOSIS — F411 Generalized anxiety disorder: Secondary | ICD-10-CM

## 2021-03-31 DIAGNOSIS — Z79899 Other long term (current) drug therapy: Secondary | ICD-10-CM | POA: Diagnosis not present

## 2021-03-31 DIAGNOSIS — I509 Heart failure, unspecified: Secondary | ICD-10-CM | POA: Diagnosis not present

## 2021-03-31 DIAGNOSIS — I10 Essential (primary) hypertension: Secondary | ICD-10-CM

## 2021-03-31 DIAGNOSIS — I4819 Other persistent atrial fibrillation: Secondary | ICD-10-CM | POA: Diagnosis not present

## 2021-03-31 DIAGNOSIS — E7800 Pure hypercholesterolemia, unspecified: Secondary | ICD-10-CM

## 2021-03-31 DIAGNOSIS — D649 Anemia, unspecified: Secondary | ICD-10-CM | POA: Diagnosis not present

## 2021-03-31 DIAGNOSIS — F32A Depression, unspecified: Secondary | ICD-10-CM | POA: Diagnosis not present

## 2021-03-31 DIAGNOSIS — M5136 Other intervertebral disc degeneration, lumbar region: Secondary | ICD-10-CM

## 2021-03-31 DIAGNOSIS — Z5181 Encounter for therapeutic drug level monitoring: Secondary | ICD-10-CM | POA: Diagnosis not present

## 2021-03-31 DIAGNOSIS — F418 Other specified anxiety disorders: Secondary | ICD-10-CM | POA: Diagnosis not present

## 2021-03-31 NOTE — Progress Notes (Signed)
Provider: Marlowe Sax FNP-C   Ngetich, Nelda Bucks, NP  Patient Care Team: Ngetich, Nelda Bucks, NP as PCP - General (Family Medicine) Sanda Klein, MD as Consulting Physician (Cardiology) Martinique, Amy, MD as Consulting Physician (Dermatology)  Extended Emergency Contact Information Primary Emergency Contact: Miltner,[name] Address: [place] [address] [address] [place] Phone: [phone] Mobile Phone: [phone] Relation: Son Secondary Emergency Contact: [place] Mobile Phone: [phone] Relation: Granddaughter  Code Status:  Full Code  Goals of care: Advanced Directive information Advanced Directives 03/31/2021  Does Patient Have a Medical Advance Directive? No  Type of Advance Directive -  Does patient want to make changes to medical advance directive? -  Copy of [place] in Chart? -  Would patient like information on creating a medical advance directive? No - Patient declined     Chief Complaint  Patient presents with   Medical Management of Chronic Issues    6 month follow up.   Immunizations    Discuss the need for Influenza vaccine, and 2nd Covid Booster    HPI:  Pt is a 80 y.o. female seen today for 6 months follow up for medical management of chronic diseases. Has a medical history of Hypertension,Atrial fibrillation,Acquired Hypothyroidism,hx of breast cancer,chronic congestive Heart failure,chronic venous insufficiency ,Major depression,DDD among other conditions.  States right side pain has improved.No longer taking oxycodone for pain.  Has some pain on the left upper quadrant worst when she leans over or bend. Pain is intermittent.Has nausea sometimes.No changes with eating.   Continues to require Xanax as needed.states helps with anxiety. States B/p at times it's low or high. No palpitation or chest pain or shortness of breath  Appetite has been good.     Past  Medical History:  Diagnosis Date   Anxiety    Atrial fibrillation with rapid ventricular response ([place]) 07/07/2015   Bilateral breast cancer (HCC)    Daily headache    Depression    GERD (gastroesophageal reflux disease)    Hyperlipidemia    Hypertension    Hypothyroidism    Iron deficiency anemia 2000s   Lumbago    Nausea alone    Senile osteoporosis    Squamous cell carcinoma of forehead    "I think they burned it off"   Past Surgical History:  Procedure Laterality Date   ABDOMINAL HYSTERECTOMY  1970s   BREAST BIOPSY Bilateral    BREAST LUMPECTOMY Right 2002   "w/lymph nodes removed"   CARDIOVERSION N/A 08/19/2015   Procedure: CARDIOVERSION;  Surgeon: Sanda Klein, MD;  Location: [facility];  Service: Cardiovascular;  Laterality: N/A;   CATARACT EXTRACTION W/ INTRAOCULAR LENS  IMPLANT, BILATERAL Bilateral 2000s   DILATION AND CURETTAGE OF UTERUS  ~ [place] Left 2011   "has 2 screws in it"   EXCISIONAL HEMORRHOIDECTOMY  ~ [place]  02/12/2016   Right Wrist   HEMIARTHROPLASTY HIP Left 2011   "had ball replaced"   INGUINAL HERNIA REPAIR Right 1980s?   MASTECTOMY Left 2005   ORIF WRIST FRACTURE Left 02/12/2016   Procedure: OPEN REDUCTION INTERNAL FIXATION LEFT DISTAL RADIUS;  Surgeon: Dayna Barker, MD;  Location: [facility];  Service: Plastics;  Laterality: Left;   TOTAL THYROIDECTOMY  1972   WRIST FRACTURE SURGERY Right    "has a plate and 7 screws in there"    No Known  Allergies  Allergies as of 03/31/2021   No Known Allergies      Medication List        Accurate as of March 31, 2021  9:25 AM. If you have any questions, ask your nurse or doctor.          STOP taking these medications    oxyCODONE 5 MG immediate release tablet Commonly known as: Roxicodone Stopped by: Sandrea Hughs, NP       TAKE these medications    acetaminophen 325 MG tablet Commonly known as: TYLENOL Take 2 tablets (650 mg total) by  mouth every 4 (four) hours as needed for headache or mild pain.   albuterol 108 (90 Base) MCG/ACT inhaler Commonly known as: VENTOLIN HFA Inhale 1 puff into the lungs 3 (three) times daily as needed for shortness of breath.   ALPRAZolam 0.5 MG tablet Commonly known as: XANAX TAKE 1 TABLET BY MOUTH DAILY FOR ANXIETY   amLODipine 2.5 MG tablet Commonly known as: NORVASC Take 1 tablet (2.5 mg total) by mouth daily.   Eliquis 5 MG Tabs tablet Generic drug: apixaban TAKE 1 TABLET BY MOUTH TWICE DAILY   furosemide 40 MG tablet Commonly known as: LASIX TAKE 1 TABLET BY MOUTH DAILY   gabapentin 100 MG capsule Commonly known as: NEURONTIN Take 1 capsule (100 mg total) by mouth at bedtime.   ibuprofen 200 MG tablet Commonly known as: ADVIL Take 200 mg by mouth daily as needed for headache or mild pain.   levothyroxine 137 MCG tablet Commonly known as: SYNTHROID TAKE 1 TABLET BY MOUTH DAILY 30 MINUTES BEFORE BREAKFAST   meclizine 12.5 MG tablet Commonly known as: ANTIVERT Take 12.5 mg by mouth as needed for dizziness (OTC for dizziness).   metoprolol tartrate 50 MG tablet Commonly known as: LOPRESSOR Take 1 tablet (50 mg total) by mouth 2 (two) times daily.   potassium chloride SA 20 MEQ tablet Commonly known as: KLOR-CON Take 2 tablets (40 mEq total) by mouth daily.   rosuvastatin 10 MG tablet Commonly known as: CRESTOR TAKE 1 TABLET BY MOUTH DAILY   sertraline 25 MG tablet Commonly known as: ZOLOFT TAKE 2 TABLETS BY MOUTH DAILY   Vitamin D3 50 MCG (2000 UT) Tabs Take 1 tablet by mouth daily.        Review of Systems  Constitutional:  Negative for appetite change, chills, fatigue, fever and unexpected weight change.  HENT:  Negative for congestion, dental problem, ear discharge, ear pain, facial swelling, hearing loss, nosebleeds, postnasal drip, rhinorrhea, sinus pressure, sinus pain, sneezing, sore throat, tinnitus and trouble swallowing.   Eyes:  Positive for  visual disturbance. Negative for pain, discharge, redness and itching.  Respiratory:  Negative for cough, chest tightness, shortness of breath and wheezing.   Cardiovascular:  Negative for chest pain, palpitations and leg swelling.  Gastrointestinal:  Negative for abdominal distention, abdominal pain, blood in stool, constipation, diarrhea, nausea and vomiting.  Endocrine: Negative for cold intolerance, heat intolerance, polydipsia, polyphagia and polyuria.  Genitourinary:  Negative for difficulty urinating, dysuria, flank pain, frequency and urgency.  Musculoskeletal:  Positive for arthralgias and gait problem. Negative for back pain, joint swelling, myalgias, neck pain and neck stiffness.  Skin:  Negative for color change, pallor, rash and wound.  Neurological:  Negative for dizziness, syncope, speech difficulty, weakness, light-headedness, numbness and headaches.  Hematological:  Does not bruise/bleed easily.  Psychiatric/Behavioral:  Negative for agitation, behavioral problems, confusion, hallucinations, self-injury, sleep disturbance and suicidal ideas. The patient  is nervous/anxious.    Immunization History  Administered Date(s) Administered   Fluad Quad(high Dose 65+) 03/20/2019, 06/03/2020   Influenza, High Dose Seasonal PF 02/08/2017, 04/04/2018   Influenza,inj,Quad PF,6+ Mos 03/14/2013, 03/26/2014, 02/19/2015, 03/02/2016   PFIZER(Purple Top)SARS-COV-2 Vaccination 07/21/2019, 08/13/2019, 07/01/2020   Pneumococcal Conjugate-13 08/02/2013, 08/03/2014   Pneumococcal Polysaccharide-23 05/22/2009   Td 10/22/2017   Zoster Recombinat (Shingrix) 05/20/2018, 11/06/2018   Zoster, Live 09/25/2013   Pertinent  Health Maintenance Due  Topic Date Due   INFLUENZA VACCINE  12/20/2020   DEXA SCAN  Completed   Fall Risk 09/27/2020 11/15/2020 01/14/2021 01/18/2021 03/31/2021  Falls in the past year? 1 0 - 0 0  Was there an injury with Fall? 0 0 - 0 0  Was there an injury with Fall? - - - - -  Fall  Risk Category Calculator 2 0 - 0 0  Fall Risk Category Moderate Low - Low Low  Patient Fall Risk Level Moderate fall risk Low fall risk Moderate fall risk Low fall risk Low fall risk  Patient at Risk for Falls Due to - No Fall Risks - No Fall Risks No Fall Risks  Fall risk Follow up - Falls evaluation completed - Falls evaluation completed Falls evaluation completed  Fall risk Follow up - - - - -   Functional Status Survey:    Vitals:   03/31/21 0912  BP: 124/88  Pulse: 65  Resp: 16  Temp: 98 F (36.7 C)  SpO2: 97%  Weight: 143 lb (64.9 kg)  Height: _0  (1.626 m)   Body mass index is 24.55 kg/m. Physical Exam Vitals reviewed.  Constitutional:      General: She is not in acute distress.    Appearance: Normal appearance. She is normal weight. She is not ill-appearing or diaphoretic.  HENT:     Head: Normocephalic.     Right Ear: Tympanic membrane, ear canal and external ear normal. There is no impacted cerumen.     Left Ear: Tympanic membrane, ear canal and external ear normal. There is no impacted cerumen.     Nose: Nose normal. No congestion or rhinorrhea.     Mouth/Throat:     Mouth: Mucous membranes are moist.     Pharynx: Oropharynx is clear. No oropharyngeal exudate or posterior oropharyngeal erythema.  Eyes:     General: No scleral icterus.       Right eye: No discharge.        Left eye: No discharge.     Extraocular Movements: Extraocular movements intact.     Conjunctiva/sclera: Conjunctivae normal.     Pupils: Pupils are equal, round, and reactive to light.  Neck:     Vascular: No carotid bruit.  Cardiovascular:     Rate and Rhythm: Normal rate and regular rhythm.     Pulses: Normal pulses.     Heart sounds: Normal heart sounds. No murmur heard.   No friction rub. No gallop.  Pulmonary:     Effort: Pulmonary effort is normal. No respiratory distress.     Breath sounds: Normal breath sounds. No wheezing, rhonchi or rales.  Chest:     Chest wall: No  tenderness.  Abdominal:     General: Bowel sounds are normal. There is no distension.     Palpations: Abdomen is soft. There is no mass.     Tenderness: There is no abdominal tenderness. There is no right CVA tenderness, left CVA tenderness, guarding or rebound.  Musculoskeletal:  General: No swelling or tenderness. Normal range of motion.     Cervical back: Normal range of motion. No rigidity or tenderness.     Right lower leg: No edema.     Left lower leg: No edema.  Lymphadenopathy:     Cervical: No cervical adenopathy.  Skin:    General: Skin is warm and dry.     Coloration: Skin is not pale.     Findings: No bruising, erythema, lesion or rash.  Neurological:     Mental Status: She is alert and oriented to person, place, and time.     Cranial Nerves: No cranial nerve deficit.     Sensory: No sensory deficit.     Motor: No weakness.     Coordination: Coordination normal.     Gait: Gait abnormal.  Psychiatric:        Mood and Affect: Mood normal.        Speech: Speech normal.        Behavior: Behavior normal.        Thought Content: Thought content normal.        Judgment: Judgment normal.    Labs reviewed: Recent Labs    09/24/20 1155 01/14/21 1142 02/04/21 1625  NA 142 141 141  K 3.5 2.8* 3.7  CL 101 100 105  CO2 31 32 25  GLUCOSE 90 105* 97  BUN _0 CREATININE 1.28* 1.06* 0.96*  CALCIUM 9.0 8.9 9.1   Recent Labs    09/24/20 1155 01/14/21 1142  AST 23 26  ALT 15 19  ALKPHOS  --  82  BILITOT 0.8 1.0  PROT 6.6 6.6  ALBUMIN  --  3.5   Recent Labs    09/24/20 1155 11/23/20 1057 01/14/21 1142  WBC 5.8 9.0 9.2  NEUTROABS 3,277 5,184 6.2  HGB 11.1* 10.4* 11.6*  HCT 36.4 35.0 38.1  MCV 84.8 85.0 82.8  PLT 152 165 141*   Lab Results  Component Value Date   TSH 0.21 (L) 09/24/2020   Lab Results  Component Value Date   HGBA1C 6.0 (H) 09/15/2019   Lab Results  Component Value Date   CHOL 99 09/15/2019   HDL 35 (L) 09/15/2019    LDLCALC 45 09/15/2019   TRIG 100 09/15/2019   CHOLHDL 2.8 09/15/2019    Significant Diagnostic Results in last 30 days:  No results found.  Assessment/Plan  1. Essential hypertension, benign B/p well controlled  Continue on Amlodipine and metoprolol On Eliquis and Rosuvastatin  - CMP with eGFR([facility]) - CBC with Differential/Platelet - CBC with Differential/Platelet; Future - CMP with eGFR([facility]); Future  2. Persistent atrial fibrillation (HCC) Continue on EliQuis  - CBC with Differential/Platelet; Future  3. Chronic congestive heart failure, unspecified heart failure type (HCC) No signs of fluid overload  Continue on Furosemide   4. Acquired hypothyroidism Continue on Levothyroxine on 125 mcg on empty stomach - TSH; Future - TSH  5. Chronic venous insufficiency Continue on Furosemide  - continue compression stockings   6. Recurrent major depressive disorder, in partial remission (HCC) Mood stable  Continue sertraline   7. DDD (degenerative disc disease), lumbar Continue current pain regimen  8. Generalized anxiety disorder Alprazolam effective  Alprazolam use contract renewed.No side effects reported  - Urine Drug Screen w/Alc, no confirm([facility])  9. Hypercholesterolemia Continue Rosuvastatin  Continue dietary modification and exercise as tolerated  - Lipid panel - Lipid panel; Future  10. Need for influenza vaccination Afebrile  Flut shot administered by  CMA no acute reaction reported.  - Flu Vaccine QUAD High Dose(Fluad)  Family/ staff Communication: Reviewed plan of care with patient verbalized understanding   Labs/tests ordered:  - CMP with eGFR([facility]) - CBC with Differential/Platelet - CBC with Differential/Platelet; Future - CMP with eGFR([facility]); Future - Lipid panel - Lipid panel; Future - TSH; Future - TSH - Urine Drug Screen w/Alc, no confirm([facility])  Next Appointment : 6 months for medical management of chronic issues.Fasting Labs prior  to visit.    Sandrea Hughs, NP

## 2021-03-31 NOTE — Patient Instructions (Addendum)
-   Continue current medication  - continue  Extra strengthen Tylenol as needed for pain

## 2021-04-01 ENCOUNTER — Other Ambulatory Visit: Payer: Self-pay | Admitting: Nurse Practitioner

## 2021-04-01 MED ORDER — LEVOTHYROXINE SODIUM 125 MCG PO TABS: 125.0000 ug | ORAL_TABLET | Freq: Every day | ORAL | 3 refills | Status: DC

## 2021-04-04 ENCOUNTER — Encounter: Payer: Self-pay | Admitting: Orthopedic Surgery

## 2021-04-04 ENCOUNTER — Other Ambulatory Visit: Payer: Self-pay

## 2021-04-04 ENCOUNTER — Ambulatory Visit (INDEPENDENT_AMBULATORY_CARE_PROVIDER_SITE_OTHER): Payer: Medicare HMO | Admitting: Orthopedic Surgery

## 2021-04-04 DIAGNOSIS — E876 Hypokalemia: Secondary | ICD-10-CM | POA: Diagnosis not present

## 2021-04-04 DIAGNOSIS — E039 Hypothyroidism, unspecified: Secondary | ICD-10-CM

## 2021-04-04 DIAGNOSIS — D649 Anemia, unspecified: Secondary | ICD-10-CM | POA: Diagnosis not present

## 2021-04-04 NOTE — Progress Notes (Signed)
Careteam: Patient Care Team: Ngetich, Nelda Bucks, NP as PCP - General (Family Medicine) Croitoru, Dani Gobble, MD as Consulting Physician (Cardiology) Martinique, Abdulkarim Eberlin, MD as Consulting Physician (Dermatology)  Seen by: Windell Moulding, AGNP-C  PLACE OF SERVICE:  [place] Directive information Does Patient Have a Medical Advance Directive?: No, Would patient like information on creating a medical advance directive?: No - Patient declined  No Known Allergies  Chief Complaint  Patient presents with   Acute Visit    Patient wants to discuss labs.      HPI: Patient is a 80 y.o. female seen today via telephone to discuss recent lab results.   She has started her new thyroid medication. Discussed need to recheck TSH in 6 weeks.   Reports feeling tired at times. Hemoglobin discussed, denies signs of bleeding. She is willing to have iron studies done with thyroid study in 6 weeks.   Discussed importance of taking potassium supplement daily. She reports missing doses since medication is too big to swallow. Advised her to try taking medication with applesauce or pudding.   Kidney function slightly worse. She admits to not drinking fluids well. Discussed importance of drinking water and avoiding NSAIDS.     Review of Systems:  Review of Systems  Constitutional:  Positive for malaise/fatigue. Negative for chills, fever and weight loss.  Respiratory:  Negative for cough, shortness of breath and wheezing.   Cardiovascular:  Negative for chest pain and leg swelling.  Musculoskeletal:  Negative for falls.  Psychiatric/Behavioral:  Negative for depression. The patient is not nervous/anxious.    Past Medical History:  Diagnosis Date   Anxiety    Atrial fibrillation with rapid ventricular response ([place]) 07/07/2015   Bilateral breast cancer (HCC)    Daily headache    Depression    GERD (gastroesophageal reflux disease)    Hyperlipidemia    Hypertension    Hypothyroidism    Iron  deficiency anemia 2000s   Lumbago    Nausea alone    Senile osteoporosis    Squamous cell carcinoma of forehead    "I think they burned it off"   Past Surgical History:  Procedure Laterality Date   ABDOMINAL HYSTERECTOMY  1970s   BREAST BIOPSY Bilateral    BREAST LUMPECTOMY Right 2002   "w/lymph nodes removed"   CARDIOVERSION N/A 08/19/2015   Procedure: CARDIOVERSION;  Surgeon: Sanda Klein, MD;  Location: [facility];  Service: Cardiovascular;  Laterality: N/A;   CATARACT EXTRACTION W/ INTRAOCULAR LENS  IMPLANT, BILATERAL Bilateral 2000s   DILATION AND CURETTAGE OF UTERUS  ~ [place] Left 2011   "has 2 screws in it"   EXCISIONAL HEMORRHOIDECTOMY  ~ [place]  02/12/2016   Right Wrist   HEMIARTHROPLASTY HIP Left 2011   "had ball replaced"   INGUINAL HERNIA REPAIR Right 1980s?   MASTECTOMY Left 2005   ORIF WRIST FRACTURE Left 02/12/2016   Procedure: OPEN REDUCTION INTERNAL FIXATION LEFT DISTAL RADIUS;  Surgeon: Dayna Barker, MD;  Location: [facility];  Service: Plastics;  Laterality: Left;   TOTAL THYROIDECTOMY  1972   WRIST FRACTURE SURGERY Right    "has a plate and 7 screws in there"   Social History:   reports that she has never smoked. She has never used smokeless tobacco. She reports that she does not drink alcohol and does not use drugs.  Family History  Problem Relation Age of Onset   Cancer Father  Heart attack Brother    Mental illness Son    Other Brother    Other Brother    Cancer Sister    Heart disease Sister    Hypertension Sister    Other Brother    Alzheimer's disease Brother    Diabetes Brother    COPD Brother    Mental illness Son     Medications: Patient's Medications  New Prescriptions   No medications on file  Previous Medications   ACETAMINOPHEN (TYLENOL) 325 MG TABLET    Take 2 tablets (650 mg total) by mouth every 4 (four) hours as needed for headache or mild pain.   ALBUTEROL (VENTOLIN HFA) 108 (90  BASE) MCG/ACT INHALER    Inhale 1 puff into the lungs 3 (three) times daily as needed for shortness of breath.   ALPRAZOLAM (XANAX) 0.5 MG TABLET    TAKE 1 TABLET BY MOUTH DAILY FOR ANXIETY   AMLODIPINE (NORVASC) 2.5 MG TABLET    Take 1 tablet (2.5 mg total) by mouth daily.   CHOLECALCIFEROL (VITAMIN D3) 2000 UNITS TABS    Take 1 tablet by mouth daily.    ELIQUIS 5 MG TABS TABLET    TAKE 1 TABLET BY MOUTH TWICE DAILY   FUROSEMIDE (LASIX) 40 MG TABLET    TAKE 1 TABLET BY MOUTH DAILY   GABAPENTIN (NEURONTIN) 100 MG CAPSULE    Take 1 capsule (100 mg total) by mouth at bedtime.   IBUPROFEN (ADVIL) 200 MG TABLET    Take 200 mg by mouth daily as needed for headache or mild pain.   LEVOTHYROXINE (SYNTHROID) 125 MCG TABLET    Take 1 tablet (125 mcg total) by mouth daily.   MECLIZINE (ANTIVERT) 12.5 MG TABLET    Take 12.5 mg by mouth as needed for dizziness (OTC for dizziness).   METOPROLOL TARTRATE (LOPRESSOR) 50 MG TABLET    Take 1 tablet (50 mg total) by mouth 2 (two) times daily.   POTASSIUM CHLORIDE SA (KLOR-CON) 20 MEQ TABLET    Take 2 tablets (40 mEq total) by mouth daily.   ROSUVASTATIN (CRESTOR) 10 MG TABLET    TAKE 1 TABLET BY MOUTH DAILY   SERTRALINE (ZOLOFT) 25 MG TABLET    TAKE 2 TABLETS BY MOUTH DAILY  Modified Medications   No medications on file  Discontinued Medications   No medications on file    Physical Exam:  There were no vitals filed for this visit. There is no height or weight on file to calculate BMI. Wt Readings from Last 3 Encounters:  03/31/21 143 lb (64.9 kg)  02/09/21 143 lb (64.9 kg)  02/04/21 150 lb 3.2 oz (68.1 kg)    Physical Examunable to complete- telephone encounter  Labs reviewed: Basic Metabolic Panel: Recent Labs    09/24/20 1155 01/14/21 1142 02/04/21 1625 03/31/21 0949  NA 142 141 141 142  K 3.5 2.8* 3.7 3.4*  CL 101 100 105 100  CO2 31 32 25 31  GLUCOSE 90 105* 97 94  BUN 19 11 13 18   CREATININE 1.28* 1.06* 0.96* 1.35*  CALCIUM 9.0 8.9  9.1 9.2  TSH 0.21*  --   --  0.36*   Liver Function Tests: Recent Labs    09/24/20 1155 01/14/21 1142 03/31/21 0949  AST 23 26 30   ALT 15 19 19   ALKPHOS  --  82  --   BILITOT 0.8 1.0 0.7  PROT 6.6 6.6 7.0  ALBUMIN  --  3.5  --  Recent Labs    01/14/21 1142  LIPASE 34   No results for input(s): AMMONIA in the last 8760 hours. CBC: Recent Labs    11/23/20 1057 01/14/21 1142 03/31/21 0949  WBC 9.0 9.2 7.3  NEUTROABS 5,184 6.2 3,964  HGB 10.4* 11.6* 11.0*  HCT 35.0 38.1 36.1  MCV 85.0 82.8 81.7  PLT 165 141* 164   Lipid Panel: Recent Labs    03/31/21 0949  CHOL 116  HDL 45*  LDLCALC 52  TRIG 106  CHOLHDL 2.6   TSH: Recent Labs    09/24/20 1155 03/31/21 0949  TSH 0.21* 0.36*   A1C: Lab Results  Component Value Date   HGBA1C 6.0 (H) 09/15/2019     Assessment/Plan: 1. Acquired hypothyroidism - TSH 0.36 03/31/2021 - levothyroxine recently decreased to 125 mcg daily - advised to take medication on empty stomach 30 min before eating/drinking - TSH; Future  2. Hypokalemia - K 3.4 03/31/2021 - admits to skipping a few doses due to size of medications - recommend taking medication with apple sauce or pudding - Basic Metabolic Panel; Future  3. Low hemoglobin - Hgb 11.0 03/31/2021 - trending down, denies signs of bleeding, some fatigue - CBC with Differential/Platelet; Future - Iron, TIBC and Ferritin Panel; Future  Telephone Note   I connected with Deborah Randall by telephone and verified that I am speaking with the correct person using two identifiers.   Location: [facility] Patient: Deborah Randall Provider: Windell Moulding, AGNP-C    I discussed the limitations, risks, security and privacy concerns of performing an evaluation and management service by telephone and the availability of in person appointments. I also discussed with the patient that there may be a patient responsible charge related to this service. The patient expressed  understanding and agreed to proceed.   I discussed the assessment and treatment plan with the patient. The patient was provided an opportunity to ask questions and all were answered. The patient agreed with the plan and demonstrated an understanding of the instructions.     The patient was advised to call back or seek an in-person evaluation if the symptoms worsen or if the condition fails to improve as anticipated.   I provided 11 minutes of non-face-to-face time during this encounter.   Windell Moulding, AGNP-C Avs printed and mailed     Next appt: none Deborah Randall [place], [facility] [phone]

## 2021-04-04 NOTE — Progress Notes (Signed)
  This service is provided via telemedicine  No vital signs collected/recorded due to the encounter was a telemedicine visit.   Location of patient (ex: home, work):  Home.  Patient consents to a telephone visit:  Yes  Location of the provider (ex: office, home):  [facility].  Name of any referring provider:  Ngetich, Dinah C, NP   Names of all persons participating in the telemedicine service and their role in the encounter:  Patient, Raisa Ditto, RMA, Amy Fargo, NP.    Time spent on call:  8 minutes spent on the phone with Medical Assistant.    

## 2021-04-04 NOTE — Patient Instructions (Signed)
Please take thyroid medication on empty stomach- 30 minutes before eating/drinking   Please take potassium supplement daily- recommend taking with applesauce or pudding  Please contact provider if any signs of bleeding: pink urine, black tarry stools or coffee grind emesis  Recommend lab visit in 6 weeks- recheck thyroid, potassium, blood count

## 2021-04-05 LAB — CBC WITH DIFFERENTIAL/PLATELET
Absolute Monocytes: 737 cells/uL (ref 200–950)
Basophils Absolute: 80 cells/uL (ref 0–200)
Basophils Relative: 1.1 %
Eosinophils Absolute: 241 cells/uL (ref 15–500)
Eosinophils Relative: 3.3 %
HCT: 36.1 % (ref 35.0–45.0)
Hemoglobin: 11 g/dL — ABNORMAL LOW (ref 11.7–15.5)
Lymphs Abs: 2278 cells/uL (ref 850–3900)
MCH: 24.9 pg — ABNORMAL LOW (ref 27.0–33.0)
MCHC: 30.5 g/dL — ABNORMAL LOW (ref 32.0–36.0)
MCV: 81.7 fL (ref 80.0–100.0)
MPV: 10.8 fL (ref 7.5–12.5)
Monocytes Relative: 10.1 %
Neutro Abs: 3964 cells/uL (ref 1500–7800)
Neutrophils Relative %: 54.3 %
Platelets: 164 10*3/uL (ref 140–400)
RBC: 4.42 10*6/uL (ref 3.80–5.10)
RDW: 15.7 % — ABNORMAL HIGH (ref 11.0–15.0)
Total Lymphocyte: 31.2 %
WBC: 7.3 10*3/uL (ref 3.8–10.8)

## 2021-04-05 LAB — LIPID PANEL
Cholesterol: 116 mg/dL (ref <200)
HDL: 45 mg/dL — ABNORMAL LOW (ref >=50)
LDL Cholesterol (Calc): 52 mg/dL (calc)
Non-HDL Cholesterol (Calc): 71 mg/dL (calc) (ref <130)
Total CHOL/HDL Ratio: 2.6 (calc) (ref <5.0)
Triglycerides: 106 mg/dL (ref <150)

## 2021-04-05 LAB — COMPLETE METABOLIC PANEL WITH GFR
AG Ratio: 1.3 (calc) (ref 1.0–2.5)
ALT: 19 U/L (ref 6–29)
AST: 30 U/L (ref 10–35)
Albumin: 4 g/dL (ref 3.6–5.1)
Alkaline phosphatase (APISO): 75 U/L (ref 37–153)
BUN/Creatinine Ratio: 13 (calc) (ref 6–22)
BUN: 18 mg/dL (ref 7–25)
CO2: 31 mmol/L (ref 20–32)
Calcium: 9.2 mg/dL (ref 8.6–10.4)
Chloride: 100 mmol/L (ref 98–110)
Creat: 1.35 mg/dL — ABNORMAL HIGH (ref 0.60–0.95)
Globulin: 3 g/dL (calc) (ref 1.9–3.7)
Glucose, Bld: 94 mg/dL (ref 65–99)
Potassium: 3.4 mmol/L — ABNORMAL LOW (ref 3.5–5.3)
Sodium: 142 mmol/L (ref 135–146)
Total Bilirubin: 0.7 mg/dL (ref 0.2–1.2)
Total Protein: 7 g/dL (ref 6.1–8.1)
eGFR: 39 mL/min/{1.73_m2} — ABNORMAL LOW (ref >=60)

## 2021-04-05 LAB — TSH: TSH: 0.36 mIU/L — ABNORMAL LOW (ref 0.40–4.50)

## 2021-04-05 LAB — DM TEMPLATE

## 2021-04-05 LAB — IRON,TIBC AND FERRITIN PANEL
%SAT: 8 % (calc) — ABNORMAL LOW (ref 16–45)
Ferritin: 11 ng/mL — ABNORMAL LOW (ref 16–288)
Iron: 39 ug/dL — ABNORMAL LOW (ref 45–160)
TIBC: 475 mcg/dL (calc) — ABNORMAL HIGH (ref 250–450)

## 2021-04-05 LAB — TEST AUTHORIZATION

## 2021-04-05 LAB — DRUG MONITOR, ALCOHOLMETAB, W/CONF, URINE: Alcohol Metabolites: NEGATIVE ng/mL (ref <500)

## 2021-04-20 ENCOUNTER — Other Ambulatory Visit: Payer: Self-pay | Admitting: Family

## 2021-04-20 DIAGNOSIS — F419 Anxiety disorder, unspecified: Secondary | ICD-10-CM

## 2021-04-20 NOTE — Telephone Encounter (Signed)
RX last refilled on 01/26/21, #30 with 1 refill  Treatment agreement on file from 03/31/21

## 2021-05-09 ENCOUNTER — Other Ambulatory Visit: Payer: Self-pay | Admitting: Orthopedic Surgery

## 2021-05-09 ENCOUNTER — Other Ambulatory Visit: Payer: Self-pay | Admitting: Family

## 2021-05-09 DIAGNOSIS — M545 Low back pain, unspecified: Secondary | ICD-10-CM

## 2021-05-09 DIAGNOSIS — F3289 Other specified depressive episodes: Secondary | ICD-10-CM

## 2021-05-09 NOTE — Telephone Encounter (Signed)
Pharmacy requested refill.  Pended Rx's and sent to [facility] for approval due to [place]. (Dinah out of office)

## 2021-05-31 ENCOUNTER — Ambulatory Visit: Payer: Medicare HMO | Admitting: Family

## 2021-05-31 ENCOUNTER — Encounter: Payer: Self-pay | Admitting: Family

## 2021-05-31 ENCOUNTER — Other Ambulatory Visit: Payer: Self-pay

## 2021-05-31 ENCOUNTER — Ambulatory Visit (INDEPENDENT_AMBULATORY_CARE_PROVIDER_SITE_OTHER): Payer: Medicare HMO | Admitting: Family

## 2021-05-31 DIAGNOSIS — Z0000 Encounter for general adult medical examination without abnormal findings: Secondary | ICD-10-CM | POA: Diagnosis not present

## 2021-05-31 DIAGNOSIS — M810 Age-related osteoporosis without current pathological fracture: Secondary | ICD-10-CM | POA: Diagnosis not present

## 2021-05-31 NOTE — Progress Notes (Signed)
This service is provided via telemedicine  No vital signs collected/recorded due to the encounter was a telemedicine visit.   Location of patient (ex: home, work):  Home  Patient consents to a telephone visit:  Yes  Location of the provider (ex: office, home):  [employer].  Name of any referring provider:  Aviendha Azbell, Nelda Bucks, NP   Names of all persons participating in the telemedicine service and their role in the encounter:  Patient, [name], [place], [place], Webb Silversmith, NP.    Time spent on call: 8 minutes spent on the phone with Medical Assistant.     Subjective:   Deborah Randall is a 81 y.o. female who presents for Medicare Annual (Subsequent) preventive examination.  Review of Systems     Cardiac Risk Factors include: advanced age (>76men, >36 women);hypertension     Objective:    Today's Vitals   05/31/21 1502  PainSc: 8    There is no height or weight on file to calculate BMI.  Advanced Directives 05/31/2021 04/04/2021 03/31/2021 01/18/2021 01/14/2021 11/15/2020 09/27/2020  Does Patient Have a Medical Advance Directive? No No No No No Yes Yes  Type of Advance Directive - - - - - Living will Living will  Does patient want to make changes to medical advance directive? - - - - - No - Patient declined No - Patient declined  Copy of [place] in Chart? - - - - - - -  Would patient like information on creating a medical advance directive? No - Patient declined No - Patient declined No - Patient declined No - Patient declined - - -    Current Medications (verified) Outpatient Encounter Medications as of 05/31/2021  Medication Sig   acetaminophen (TYLENOL) 325 MG tablet Take 2 tablets (650 mg total) by mouth every 4 (four) hours as needed for headache or mild pain.   albuterol (VENTOLIN HFA) 108 (90 Base) MCG/ACT inhaler Inhale 1 puff into the lungs 3 (three) times daily as needed for shortness of breath.   ALPRAZolam (XANAX) 0.5 MG tablet TAKE  1 TABLET BY MOUTH DAILY FOR ANXIETY   amLODipine (NORVASC) 2.5 MG tablet Take 1 tablet (2.5 mg total) by mouth daily.   Cholecalciferol (VITAMIN D3) 2000 UNITS TABS Take 1 tablet by mouth daily.    ELIQUIS 5 MG TABS tablet TAKE 1 TABLET BY MOUTH TWICE DAILY   furosemide (LASIX) 40 MG tablet TAKE 1 TABLET BY MOUTH DAILY   gabapentin (NEURONTIN) 100 MG capsule TAKE 1 CAPSULE BY MOUTH DAILY AT BEDTIME   ibuprofen (ADVIL) 200 MG tablet Take 200 mg by mouth daily as needed for headache or mild pain.   levothyroxine (SYNTHROID) 125 MCG tablet Take 1 tablet (125 mcg total) by mouth daily.   meclizine (ANTIVERT) 12.5 MG tablet Take 12.5 mg by mouth as needed for dizziness (OTC for dizziness).   metoprolol tartrate (LOPRESSOR) 50 MG tablet Take 1 tablet (50 mg total) by mouth 2 (two) times daily.   potassium chloride SA (KLOR-CON) 20 MEQ tablet Take 2 tablets (40 mEq total) by mouth daily.   rosuvastatin (CRESTOR) 10 MG tablet TAKE 1 TABLET BY MOUTH DAILY   sertraline (ZOLOFT) 25 MG tablet TAKE 2 TABLETS BY MOUTH DAILY   No facility-administered encounter medications on file as of 05/31/2021.    Allergies (verified) Patient has no known allergies.   History: Past Medical History:  Diagnosis Date   Anxiety    Atrial fibrillation with rapid ventricular response (  [place]) 07/07/2015   Bilateral breast cancer (HCC)    Daily headache    Depression    GERD (gastroesophageal reflux disease)    Hyperlipidemia    Hypertension    Hypothyroidism    Iron deficiency anemia 2000s   Lumbago    Nausea alone    Senile osteoporosis    Squamous cell carcinoma of forehead    "I think they burned it off"   Past Surgical History:  Procedure Laterality Date   ABDOMINAL HYSTERECTOMY  1970s   BREAST BIOPSY Bilateral    BREAST LUMPECTOMY Right 2002   "w/lymph nodes removed"   CARDIOVERSION N/A 08/19/2015   Procedure: CARDIOVERSION;  Surgeon: Sanda Klein, MD;  Location: [facility];  Service: Cardiovascular;   Laterality: N/A;   CATARACT EXTRACTION W/ INTRAOCULAR LENS  IMPLANT, BILATERAL Bilateral 2000s   DILATION AND CURETTAGE OF UTERUS  ~ [place] Left 2011   "has 2 screws in it"   EXCISIONAL HEMORRHOIDECTOMY  ~ [place]  02/12/2016   Right Wrist   HEMIARTHROPLASTY HIP Left 2011   "had ball replaced"   INGUINAL HERNIA REPAIR Right 1980s?   MASTECTOMY Left 2005   ORIF WRIST FRACTURE Left 02/12/2016   Procedure: OPEN REDUCTION INTERNAL FIXATION LEFT DISTAL RADIUS;  Surgeon: Dayna Barker, MD;  Location: [facility];  Service: Plastics;  Laterality: Left;   TOTAL THYROIDECTOMY  1972   WRIST FRACTURE SURGERY Right    "has a plate and 7 screws in there"   Family History  Problem Relation Age of Onset   Cancer Father    Heart attack Brother    Mental illness Son    Other Brother    Other Brother    Cancer Sister    Heart disease Sister    Hypertension Sister    Other Brother    Alzheimer's disease Brother    Diabetes Brother    COPD Brother    Mental illness Son    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   Tobacco Use   Smoking status: Never   Smokeless tobacco: Never  Vaping Use   Vaping Use: Never used  Substance and Sexual Activity   Alcohol use: No   Drug use: No   Sexual activity: Never  Other Topics Concern   Not on file  Social History Narrative   Lives alone   Widow   2 sons , one son decease, one son disable    Works at [employer] in [employer]   Enjoys going places with friends   Never smoked   Alcohol none   Exercise -walk, works in yard    Social Determinants of Radio broadcast assistant Strain: Not on Art therapist Insecurity: Not on file  Transportation Needs: Not on file  Physical  Activity: Not on file  Stress: Not on file  Social Connections: Not on file    Tobacco Counseling Counseling given: Not Answered   Clinical Intake:  Pre-visit preparation completed: No  Pain : 0-10 Pain Score: 8  (eases off with analgesics) Pain Type: Chronic pain Pain Location: Back Pain Orientation: Mid Pain Radiating Towards: No Pain Descriptors / Indicators: Aching Pain Onset: Other (comment) (  several years) Pain Frequency: Intermittent Pain Relieving Factors: OTC analgesics Effect of Pain on Daily Activities: standing  Pain Relieving Factors: OTC analgesics  BMI - recorded: 24.55 Nutritional Risks: None Diabetes: No  How often do you need to have someone help you when you read instructions, pamphlets, or other written materials from your doctor or pharmacy?: 1 - Never What is the last grade level you completed in school?: 12 grade  Diabetic?Yes   Interpreter Needed?: No  Information entered by :: Emmalea Treanor,FNP-C   Activities of Daily Living In your present state of health, do you have any difficulty performing the following activities: 05/31/2021  Hearing? N  Vision? N  Difficulty concentrating or making decisions? N  Walking or climbing stairs? N  Dressing or bathing? N  Doing errands, shopping? Y  Comment son [place] and eating ? N  In the past six months, have you accidently leaked urine? N  Do you have problems with loss of bowel control? N  Managing your Medications? N  Managing your Finances? N  Housekeeping or managing your Housekeeping? N  Some recent data might be hidden    Patient Care Team: Quentavious Rittenhouse, Nelda Bucks, NP as PCP - General (Family Medicine) Croitoru, Dani Gobble, MD as Consulting Physician (Cardiology) Martinique, Amy, MD as Consulting Physician (Dermatology)  Indicate any recent Medical Services you may have received from other than [facility] providers in the past year (date may be approximate).     Assessment:   This is a  routine wellness examination for [facility].  Hearing/Vision screen Hearing Screening - Comments:: No Hearing Concerns. Patient doesn't wear hearing aids.  Vision Screening - Comments:: No vision concerns. Patient wears prescription glasses. Patient last eye exam years ago.  Dietary issues and exercise activities discussed: Current Exercise Habits: Home exercise routine, Type of exercise: walking, Time (Minutes): 30, Frequency (Times/Week): 3, Weekly Exercise (Minutes/Week): 90, Intensity: Mild, Exercise limited by: None identified   Goals Addressed             This Visit's Progress    Increase water intake   On track    Starting 03/02/16, I will attempt to drink 3 bottles of water per day, instead of 1-2 bottles.       Depression Screen PHQ 2/9 Scores 05/31/2021 05/27/2020 01/19/2020 01/15/2020 05/26/2019 03/20/2019 05/20/2018  PHQ - 2 Score 0 0 0 0 0 0 0  Exception Documentation - - - - - - -    Fall Risk Fall Risk  05/31/2021 04/04/2021 03/31/2021 01/18/2021 11/15/2020  Falls in the past year? 0 0 0 0 0  Number falls in past yr: 0 0 0 0 0  Injury with Fall? 0 0 0 0 0  Comment - - - - -  Risk for fall due to : No Fall Risks No Fall Risks No Fall Risks No Fall Risks No Fall Risks  Follow up Falls evaluation completed Falls evaluation completed Falls evaluation completed Falls evaluation completed Falls evaluation completed  Comment - - - - -    FALL RISK PREVENTION PERTAINING TO THE HOME:  Any stairs in or around the home? Yes  If so, are there any without handrails? No  Home free of loose throw rugs in walkways, pet beds, electrical cords, etc? No  Adequate lighting in your home to reduce risk of falls? Yes   ASSISTIVE DEVICES UTILIZED TO PREVENT FALLS:  Life alert? No  Use of a cane, walker or w/c? Yes  Grab bars in the  bathroom? Yes  Shower chair or bench in shower? No  Elevated toilet seat or a handicapped toilet? No   TIMED UP AND GO:  Was the test performed? No .   Length of time to ambulate 10 feet: N/A sec.   Gait slow and steady with assistive device  Cognitive Function: MMSE - Mini Mental State Exam 05/16/2017 03/02/2016 02/19/2015 09/25/2013  Orientation to time 5 5 5 5   Orientation to Place 5 5 5 5   Registration 3 3 3 3   Attention/ Calculation 5 5 5 5   Recall 1 3 3 2   Language- name 2 objects 2 2 2 2   Language- repeat 1 1 1 1   Language- follow 3 step command 3 3 3 3   Language- read & follow direction 1 1 1 1   Write a sentence 1 1 1 1   Copy design 1 - 1 1  Total score 28 - 30 29     6CIT Screen 05/31/2021 05/27/2020 05/26/2019  What Year? 0 points 0 points 0 points  What month? 0 points 0 points 0 points  What time? 0 points 0 points 0 points  Count back from 20 0 points 0 points 0 points  Months in reverse 4 points 0 points 0 points  Repeat phrase 0 points 2 points 0 points  Total Score 4 2 0    Immunizations Immunization History  Administered Date(s) Administered   Fluad Quad(high Dose 65+) 03/20/2019, 06/03/2020, 03/31/2021   Influenza, High Dose Seasonal PF 02/08/2017, 04/04/2018   Influenza,inj,Quad PF,6+ Mos 03/14/2013, 03/26/2014, 02/19/2015, 03/02/2016   PFIZER(Purple Top)SARS-COV-2 Vaccination 07/21/2019, 08/13/2019, 07/01/2020   Pneumococcal Conjugate-13 08/02/2013, 08/03/2014   Pneumococcal Polysaccharide-23 05/22/2009   Td 10/22/2017   Zoster Recombinat (Shingrix) 05/20/2018, 11/06/2018   Zoster, Live 09/25/2013    TDAP status: Up to date  Flu Vaccine status: Up to date  Pneumococcal vaccine status: Up to date  Covid-19 vaccine status: Information provided on how to obtain vaccines.   Qualifies for Shingles Vaccine? Yes   Zostavax completed Yes   Shingrix Completed?: Yes  Screening Tests Health Maintenance  Topic Date Due   COVID-19 Vaccine (4 - Booster for Pfizer series) 08/26/2020   TETANUS/TDAP  10/23/2027   Pneumonia Vaccine 13+ Years old  Completed   INFLUENZA VACCINE  Completed   DEXA SCAN   Completed   Zoster Vaccines- Shingrix  Completed   HPV VACCINES  Aged Out    Health Maintenance  Health Maintenance Due  Topic Date Due   COVID-19 Vaccine (4 - Booster for Pfizer series) 08/26/2020     Colorectal cancer screening: No longer required.  Mammogram status: No longer required due to advance age .  Bone Density status: Completed 03/08/2018. Results reflect: Bone density results: OSTEOPOROSIS. Repeat every 2 years.  Lung Cancer Screening: (Low Dose CT Chest recommended if Age 65-80 years, 30 pack-year currently smoking OR have quit w/in 15years.) does not qualify.   Lung Cancer Screening Referral: No   Additional Screening:  Hepatitis C Screening: does not qualify; Completed No   Vision Screening: Recommended annual ophthalmology exams for early detection of glaucoma and other disorders of the eye. Is the patient up to date with their annual eye exam?  Yes  Who is the provider or what is the name of the office in which the patient attends annual eye exams? Dr.Tanner  If pt is not established with a provider, would they like to be referred to a provider to establish care? No .   Dental Screening:  Recommended annual dental exams for proper oral hygiene  Community Resource Referral / Chronic Care Management: CRR required this visit?  No   CCM required this visit?  No      Plan:     I have personally reviewed and noted the following in the patients chart:   Medical and social history Use of alcohol, tobacco or illicit drugs  Current medications and supplements including opioid prescriptions.  Functional ability and status Nutritional status Physical activity Advanced directives List of other physicians Hospitalizations, surgeries, and ER visits in previous 12 months Vitals Screenings to include cognitive, depression, and falls Referrals and appointments  In addition, I have reviewed and discussed with patient certain preventive protocols, quality  metrics, and best practice recommendations. A written personalized care plan for preventive services as well as general preventive health recommendations were provided to patient.     Sandrea Hughs, NP   05/31/2021   Nurse Notes:  - advised to get 4 th COVID-19 booster vaccine at the pharmacy  - Bone density ordered today

## 2021-05-31 NOTE — Patient Instructions (Signed)
Deborah Randall , Thank you for taking time to come for your Medicare Wellness Visit. I appreciate your ongoing commitment to your health goals. Please review the following plan we discussed and let me know if I can assist you in the future.   Screening recommendations/referrals: Colonoscopy N/A  Mammogram N/A  Bone Density : Ordered today [facility] will call you to make appointment  Recommended yearly ophthalmology/optometry visit for glaucoma screening and checkup Recommended yearly dental visit for hygiene and checkup  Vaccinations: Influenza vaccine : Up to date  Pneumococcal vaccine : Up to date  Tdap vaccine : Up to date  Shingles vaccine : Up to date     Advanced directives: No   Conditions/risks identified: Advance age female > 69 yrs ,Hypertension   Next appointment: 1 year    Preventive Care 73 Years and Older, Female Preventive care refers to lifestyle choices and visits with your health care provider that can promote health and wellness. What does preventive care include? A yearly physical exam. This is also called an annual well check. Dental exams once or twice a year. Routine eye exams. Ask your health care provider how often you should have your eyes checked. Personal lifestyle choices, including: Daily care of your teeth and gums. Regular physical activity. Eating a healthy diet. Avoiding tobacco and drug use. Limiting alcohol use. Practicing safe sex. Taking low-dose aspirin every day. Taking vitamin and mineral supplements as recommended by your health care provider. What happens during an annual well check? The services and screenings done by your health care provider during your annual well check will depend on your age, overall health, lifestyle risk factors, and family history of disease. Counseling  Your health care provider may ask you questions about your: Alcohol use. Tobacco use. Drug use. Emotional well-being. Home and relationship  well-being. Sexual activity. Eating habits. History of falls. Memory and ability to understand (cognition). Work and work Statistician. Reproductive health. Screening  You may have the following tests or measurements: Height, weight, and BMI. Blood pressure. Lipid and cholesterol levels. These may be checked every 5 years, or more frequently if you are over 58 years old. Skin check. Lung cancer screening. You may have this screening every year starting at age 66 if you have a 30-pack-year history of smoking and currently smoke or have quit within the past 15 years. Fecal occult blood test (FOBT) of the stool. You may have this test every year starting at age 18. Flexible sigmoidoscopy or colonoscopy. You may have a sigmoidoscopy every 5 years or a colonoscopy every 10 years starting at age 59. Hepatitis C blood test. Hepatitis B blood test. Sexually transmitted disease (STD) testing. Diabetes screening. This is done by checking your blood sugar (glucose) after you have not eaten for a while (fasting). You may have this done every 1-3 years. Bone density scan. This is done to screen for osteoporosis. You may have this done starting at age 82. Mammogram. This may be done every 1-2 years. Talk to your health care provider about how often you should have regular mammograms. Talk with your health care provider about your test results, treatment options, and if necessary, the need for more tests. Vaccines  Your health care provider may recommend certain vaccines, such as: Influenza vaccine. This is recommended every year. Tetanus, diphtheria, and acellular pertussis (Tdap, Td) vaccine. You may need a Td booster every 10 years. Zoster vaccine. You may need this after age 55. Pneumococcal 13-valent conjugate (PCV13) vaccine. One  dose is recommended after age 36. Pneumococcal polysaccharide (PPSV23) vaccine. One dose is recommended after age 58. Talk to your health care provider about which  screenings and vaccines you need and how often you need them. This information is not intended to replace advice given to you by your health care provider. Make sure you discuss any questions you have with your health care provider. Document Released: 06/04/2015 Document Revised: 01/26/2016 Document Reviewed: 03/09/2015 Elsevier Interactive Patient Education  2017 [employer] Prevention in the Home Falls can cause injuries. They can happen to people of all ages. There are many things you can do to make your home safe and to help prevent falls. What can I do on the outside of my home? Regularly fix the edges of walkways and driveways and fix any cracks. Remove anything that might make you trip as you walk through a door, such as a raised step or threshold. Trim any bushes or trees on the path to your home. Use bright outdoor lighting. Clear any walking paths of anything that might make someone trip, such as rocks or tools. Regularly check to see if handrails are loose or broken. Make sure that both sides of any steps have handrails. Any raised decks and porches should have guardrails on the edges. Have any leaves, snow, or ice cleared regularly. Use sand or salt on walking paths during winter. Clean up any spills in your garage right away. This includes oil or grease spills. What can I do in the bathroom? Use night lights. Install grab bars by the toilet and in the tub and shower. Do not use towel bars as grab bars. Use non-skid mats or decals in the tub or shower. If you need to sit down in the shower, use a plastic, non-slip stool. Keep the floor dry. Clean up any water that spills on the floor as soon as it happens. Remove soap buildup in the tub or shower regularly. Attach bath mats securely with double-sided non-slip rug tape. Do not have throw rugs and other things on the floor that can make you trip. What can I do in the bedroom? Use night lights. Make sure that you have a  light by your bed that is easy to reach. Do not use any sheets or blankets that are too big for your bed. They should not hang down onto the floor. Have a firm chair that has side arms. You can use this for support while you get dressed. Do not have throw rugs and other things on the floor that can make you trip. What can I do in the kitchen? Clean up any spills right away. Avoid walking on wet floors. Keep items that you use a lot in easy-to-reach places. If you need to reach something above you, use a strong step stool that has a grab bar. Keep electrical cords out of the way. Do not use floor polish or wax that makes floors slippery. If you must use wax, use non-skid floor wax. Do not have throw rugs and other things on the floor that can make you trip. What can I do with my stairs? Do not leave any items on the stairs. Make sure that there are handrails on both sides of the stairs and use them. Fix handrails that are broken or loose. Make sure that handrails are as long as the stairways. Check any carpeting to make sure that it is firmly attached to the stairs. Fix any carpet that is loose or worn.  Avoid having throw rugs at the top or bottom of the stairs. If you do have throw rugs, attach them to the floor with carpet tape. Make sure that you have a light switch at the top of the stairs and the bottom of the stairs. If you do not have them, ask someone to add them for you. What else can I do to help prevent falls? Wear shoes that: Do not have high heels. Have rubber bottoms. Are comfortable and fit you well. Are closed at the toe. Do not wear sandals. If you use a stepladder: Make sure that it is fully opened. Do not climb a closed stepladder. Make sure that both sides of the stepladder are locked into place. Ask someone to hold it for you, if possible. Clearly mark and make sure that you can see: Any grab bars or handrails. First and last steps. Where the edge of each step  is. Use tools that help you move around (mobility aids) if they are needed. These include: Canes. Walkers. Scooters. Crutches. Turn on the lights when you go into a dark area. Replace any light bulbs as soon as they burn out. Set up your furniture so you have a clear path. Avoid moving your furniture around. If any of your floors are uneven, fix them. If there are any pets around you, be aware of where they are. Review your medicines with your doctor. Some medicines can make you feel dizzy. This can increase your chance of falling. Ask your doctor what other things that you can do to help prevent falls. This information is not intended to replace advice given to you by your health care provider. Make sure you discuss any questions you have with your health care provider. Document Released: 03/04/2009 Document Revised: 10/14/2015 Document Reviewed: 06/12/2014 Elsevier Interactive Patient Education  2017 [employer].

## 2021-07-22 ENCOUNTER — Other Ambulatory Visit: Payer: Self-pay | Admitting: Cardiovascular Disease

## 2021-07-22 NOTE — Telephone Encounter (Signed)
Prescription refill request for Eliquis received. ?Indication:Afib ?Last office visit:3/22 ?Scr:1.3 ?Age: 81 ?Weight:64.9 kg ? ?Prescription refilled ? ?

## 2021-08-29 ENCOUNTER — Other Ambulatory Visit: Payer: Self-pay | Admitting: Family

## 2021-08-29 DIAGNOSIS — R0609 Other forms of dyspnea: Secondary | ICD-10-CM

## 2021-08-29 DIAGNOSIS — I509 Heart failure, unspecified: Secondary | ICD-10-CM

## 2021-09-08 ENCOUNTER — Emergency Department (HOSPITAL_COMMUNITY): Payer: Medicare HMO

## 2021-09-08 ENCOUNTER — Other Ambulatory Visit: Payer: Self-pay

## 2021-09-08 ENCOUNTER — Emergency Department (HOSPITAL_COMMUNITY)
Admission: EM | Admit: 2021-09-08 | Discharge: 2021-09-09 | Disposition: A | Discharge status: Home / Self Care | Payer: Medicare HMO | Attending: Emergency Medicine | Admitting: Emergency Medicine

## 2021-09-08 ENCOUNTER — Encounter (HOSPITAL_COMMUNITY): Payer: Self-pay | Admitting: Emergency Medicine

## 2021-09-08 DIAGNOSIS — S42401A Unspecified fracture of lower end of right humerus, initial encounter for closed fracture: Secondary | ICD-10-CM | POA: Diagnosis not present

## 2021-09-08 DIAGNOSIS — Y92096 Garden or yard of other non-institutional residence as the place of occurrence of the external cause: Secondary | ICD-10-CM | POA: Diagnosis not present

## 2021-09-08 DIAGNOSIS — Z7901 Long term (current) use of anticoagulants: Secondary | ICD-10-CM | POA: Insufficient documentation

## 2021-09-08 DIAGNOSIS — M546 Pain in thoracic spine: Secondary | ICD-10-CM | POA: Diagnosis not present

## 2021-09-08 DIAGNOSIS — Y9301 Activity, walking, marching and hiking: Secondary | ICD-10-CM | POA: Insufficient documentation

## 2021-09-08 DIAGNOSIS — Z8739 Personal history of other diseases of the musculoskeletal system and connective tissue: Secondary | ICD-10-CM | POA: Diagnosis not present

## 2021-09-08 DIAGNOSIS — S0990XA Unspecified injury of head, initial encounter: Secondary | ICD-10-CM | POA: Insufficient documentation

## 2021-09-08 DIAGNOSIS — M25531 Pain in right wrist: Secondary | ICD-10-CM | POA: Diagnosis not present

## 2021-09-08 DIAGNOSIS — I700 Atherosclerosis of aorta: Secondary | ICD-10-CM | POA: Diagnosis not present

## 2021-09-08 DIAGNOSIS — S299XXA Unspecified injury of thorax, initial encounter: Secondary | ICD-10-CM | POA: Diagnosis present

## 2021-09-08 DIAGNOSIS — S22070A Wedge compression fracture of T9-T10 vertebra, initial encounter for closed fracture: Secondary | ICD-10-CM | POA: Diagnosis not present

## 2021-09-08 DIAGNOSIS — W01198A Fall on same level from slipping, tripping and stumbling with subsequent striking against other object, initial encounter: Secondary | ICD-10-CM | POA: Insufficient documentation

## 2021-09-08 DIAGNOSIS — M7989 Other specified soft tissue disorders: Secondary | ICD-10-CM | POA: Diagnosis not present

## 2021-09-08 DIAGNOSIS — S22078A Other fracture of T9-T10 vertebra, initial encounter for closed fracture: Secondary | ICD-10-CM | POA: Diagnosis not present

## 2021-09-08 DIAGNOSIS — M25521 Pain in right elbow: Secondary | ICD-10-CM | POA: Diagnosis not present

## 2021-09-08 DIAGNOSIS — G319 Degenerative disease of nervous system, unspecified: Secondary | ICD-10-CM | POA: Diagnosis not present

## 2021-09-08 DIAGNOSIS — Z043 Encounter for examination and observation following other accident: Secondary | ICD-10-CM | POA: Diagnosis not present

## 2021-09-08 DIAGNOSIS — S52021A Displaced fracture of olecranon process without intraarticular extension of right ulna, initial encounter for closed fracture: Secondary | ICD-10-CM | POA: Diagnosis not present

## 2021-09-08 DIAGNOSIS — M25431 Effusion, right wrist: Secondary | ICD-10-CM | POA: Diagnosis not present

## 2021-09-08 MED ORDER — HYDROCODONE-ACETAMINOPHEN 5-325 MG PO TABS
1.0000 | ORAL_TABLET | Freq: Once | ORAL | Status: AC
  Administered 2021-09-08: 1 via ORAL
  Filled 2021-09-08: qty 1

## 2021-09-08 NOTE — ED Triage Notes (Signed)
Pt fell onto her right arm today. Was at [facility] but was unable to do the xray due to pain. Pt in a sling in triage. Pt on blood thinners but did not hit her head ?

## 2021-09-08 NOTE — ED Provider Triage Note (Signed)
Emergency Medicine Provider Triage Evaluation Note ? ?Deborah Randall , a 81 y.o. female  was evaluated in triage.  Pt complains of right elbow, wrist, and thoracic spine pain secondary to a fall.  Patient states that she tripped and fell in her driveway.  She dragged herself across the ground over the course of an hour and a half to call for help.  Patient went to urgent care but was unable to tolerate movement for x-rays due to pain.  Denies hitting head, denies loss of consciousness.  Patient is on blood thinners. ? ?Review of Systems  ?Positive: Right arm pain, right wrist pain, thoracic spine pain ?Negative: Loss of consciousness, neck pain ? ?Physical Exam  ?BP 125/75 (BP Location: Left Arm)   Pulse 88   Temp 99.2 ?F (37.3 ?C) (Oral)   Resp 18   Ht '5\' 4"'$  (1.626 m)   Wt 67.1 kg   SpO2 92%   BMI 25.40 kg/m?  ?Gen:   Awake, no distress   ?Resp:  Normal effort  ?MSK:   Patient able to move fingers of right hand without difficulty, refuses to move elbow due to pain ?Other:   ? ?Medical Decision Making  ?Medically screening exam initiated at 8:58 PM.  Appropriate orders placed.  Deborah Randall was informed that the remainder of the evaluation will be completed by another provider, this initial triage assessment does not replace that evaluation, and the importance of remaining in the ED until their evaluation is complete. ? ? ?  ?Dorothyann Peng, PA-C ?09/08/21 2101 ? ?

## 2021-09-08 NOTE — ED Provider Notes (Signed)
?[facility] ?Provider Note ? ? ?CSN: [id number] ?Arrival date & time: 09/08/21  2015 ? ?  ? ?History ? ?Chief Complaint  ?Patient presents with  ? Arm Pain  ? ? ?Deborah Randall is a 81 y.o. female. ? ?Patient presents for evaluation after a fall.  Patient reports that she stumbled while walking in her backyard and fell backwards.  She tried to catch herself when she fell and injured her right arm.  She also is complaining of back pain.  No numbness, tingling or weakness of her extremities. ? ? ?  ? ?Home Medications ?Prior to Admission medications   ?Medication Sig Start Date End Date Taking? Authorizing Provider  ?HYDROcodone-acetaminophen (NORCO/VICODIN) 5-325 MG tablet Take 1 tablet by mouth every 4 (four) hours as needed for moderate pain. 09/09/21  Yes Amity Roes, Gwenyth Allegra, MD  ?acetaminophen (TYLENOL) 325 MG tablet Take 2 tablets (650 mg total) by mouth every 4 (four) hours as needed for headache or mild pain. 07/08/15   Erlene Quan, PA-C  ?albuterol (VENTOLIN HFA) 108 (90 Base) MCG/ACT inhaler Inhale 1 puff into the lungs 3 (three) times daily as needed for shortness of breath.    [provider]  ?ALPRAZolam (XANAX) 0.5 MG tablet TAKE 1 TABLET BY MOUTH DAILY FOR ANXIETY 04/20/21   Medina-Vargas, Monina C, NP  ?amLODipine (NORVASC) 2.5 MG tablet Take 1 tablet (2.5 mg total) by mouth daily. 08/18/20   Croitoru, Mihai, MD  ?Cholecalciferol (VITAMIN D3) 2000 UNITS TABS Take 1 tablet by mouth daily.     [provider]  ?ELIQUIS 5 MG TABS tablet TAKE 1 TABLET BY MOUTH TWICE DAILY 07/22/21   Croitoru, Mihai, MD  ?furosemide (LASIX) 40 MG tablet TAKE 1 TABLET BY MOUTH DAILY 08/29/21   Ngetich, Dinah C, NP  ?gabapentin (NEURONTIN) 100 MG capsule TAKE 1 CAPSULE BY MOUTH DAILY AT BEDTIME 05/09/21   Ngetich, Dinah C, NP  ?ibuprofen (ADVIL) 200 MG tablet Take 200 mg by mouth daily as needed for headache or mild pain.    [provider]  ?levothyroxine  (SYNTHROID) 125 MCG tablet Take 1 tablet (125 mcg total) by mouth daily. 04/01/21   Lauree Chandler, NP  ?meclizine (ANTIVERT) 12.5 MG tablet Take 12.5 mg by mouth as needed for dizziness (OTC for dizziness).    [provider]  ?metoprolol tartrate (LOPRESSOR) 50 MG tablet Take 1 tablet (50 mg total) by mouth 2 (two) times daily. 09/16/20   Croitoru, Mihai, MD  ?potassium chloride SA (KLOR-CON) 20 MEQ tablet Take 2 tablets (40 mEq total) by mouth daily. 02/07/21   Medina-Vargas, Senaida Lange, NP  ?rosuvastatin (CRESTOR) 10 MG tablet TAKE 1 TABLET BY MOUTH DAILY 05/09/21   Lauree Chandler, NP  ?sertraline (ZOLOFT) 25 MG tablet TAKE 2 TABLETS BY MOUTH DAILY 05/09/21   Lauree Chandler, NP  ?   ? ?Allergies    ?Patient has no known allergies.   ? ?Review of Systems   ?Review of Systems  ?Musculoskeletal:  Positive for arthralgias and back pain.  ? ?Physical Exam ?Updated Vital Signs ?BP 117/66 (BP Location: Left Arm)   Pulse 74   Temp 99.2 ?F (37.3 ?C) (Oral)   Resp 16   Ht '5\' 4"'$  (1.626 m)   Wt 67.1 kg   SpO2 96%   BMI 25.40 kg/m?  ?Physical Exam ?Vitals and nursing note reviewed.  ?Constitutional:   ?   General: She is not in acute distress. ?  Appearance: She is well-developed.  ?HENT:  ?   Head: Normocephalic and atraumatic.  ?   Mouth/Throat:  ?   Mouth: Mucous membranes are moist.  ?Eyes:  ?   General: Vision grossly intact. Gaze aligned appropriately.  ?   Extraocular Movements: Extraocular movements intact.  ?   Conjunctiva/sclera: Conjunctivae normal.  ?Cardiovascular:  ?   Rate and Rhythm: Normal rate and regular rhythm.  ?   Pulses: Normal pulses.  ?   Heart sounds: Normal heart sounds, S1 normal and S2 normal. No murmur heard. ?  No friction rub. No gallop.  ?Pulmonary:  ?   Effort: Pulmonary effort is normal. No respiratory distress.  ?   Breath sounds: Normal breath sounds.  ?Abdominal:  ?   General: Bowel sounds are normal.  ?   Palpations: Abdomen is soft.  ?   Tenderness: There is no  abdominal tenderness. There is no guarding or rebound.  ?   Hernia: No hernia is present.  ?Musculoskeletal:     ?   General: No swelling.  ?   Right elbow: Decreased range of motion. Tenderness present.  ?   Cervical back: Full passive range of motion without pain, normal range of motion and neck supple. No spinous process tenderness or muscular tenderness. Normal range of motion.  ?   Thoracic back: Bony tenderness present.  ?     Back: ? ?   Right lower leg: No edema.  ?   Left lower leg: No edema.  ?Skin: ?   General: Skin is warm and dry.  ?   Capillary Refill: Capillary refill takes less than 2 seconds.  ?   Findings: No ecchymosis, erythema, rash or wound.  ?Neurological:  ?   General: No focal deficit present.  ?   Mental Status: She is alert and oriented to person, place, and time.  ?   GCS: GCS eye subscore is 4. GCS verbal subscore is 5. GCS motor subscore is 6.  ?   Cranial Nerves: Cranial nerves 2-12 are intact.  ?   Sensory: Sensation is intact.  ?   Motor: Motor function is intact.  ?   Coordination: Coordination is intact.  ?Psychiatric:     ?   Attention and Perception: Attention normal.     ?   Mood and Affect: Mood normal.     ?   Speech: Speech normal.     ?   Behavior: Behavior normal.  ? ? ?ED Results / Procedures / Treatments   ?Labs ?(all labs ordered are listed, but only abnormal results are displayed) ?Labs Reviewed - No data to display ? ?EKG ?None ? ?Radiology ?DG Thoracic Spine 2 View ? ?Result Date: 09/08/2021 ?CLINICAL DATA:  Midthoracic spine pain, fall EXAM: THORACIC SPINE 2 VIEWS COMPARISON:  Chest radiograph 01/14/2021 FINDINGS: There is interval development of a severe anterior wedge compression fracture of T9 with approximately 50-60% loss of height, age indeterminate. Mild compression deformity of T8 appears stable. Resultant focal kyphosis of the midthoracic spine. Osseous structures are diffusely osteopenic. Remaining vertebral body height is preserved.q there is paravertebral  soft tissue swelling suspected adjacent to the T8-9 vertebral bodies suggesting that this may represent an acute to subacute process. Moderate hiatal hernia incidentally noted. IMPRESSION: Severe T9 compression deformity, age indeterminate but suspected acute to subacute given adjacent paravertebral soft tissue swelling. Correlation with point tenderness be helpful in confirming acuity. Osteopenia.  Stable mild compression deformity T8. Electronically Signed   By: Cassandria Anger  Christa See M.D.   On: 09/08/2021 22:06  ? ?DG Elbow Complete Right ? ?Result Date: 09/08/2021 ?CLINICAL DATA:  Fall, right elbow pain EXAM: RIGHT ELBOW - COMPLETE 3+ VIEW COMPARISON:  None. FINDINGS: Normal alignment. Joint spaces preserved. No definite acute fracture identified. Right elbow effusion is present, however, suggesting a nondisplaced/radio-occult fracture. Soft tissues are otherwise unremarkable. IMPRESSION: No displaced fracture identified. Right elbow effusion suggesting an underlying radio occult fracture. Dedicated CT or MRI imaging may be more helpful for further evaluation. Electronically Signed   By: Fidela Salisbury M.D.   On: 09/08/2021 22:11  ? ?DG Forearm Right ? ?Result Date: 09/08/2021 ?CLINICAL DATA:  Right wrist pain, fall EXAM: RIGHT FOREARM - 2 VIEW COMPARISON:  None. FINDINGS: No acute fracture or dislocation. Distal right radial ORIF has been performed with a volar plate and screws with residual dorsal tilt of the distal radial articular surface. Remote nonunited fracture of the base of the ulnar styloid noted. Right elbow effusion is noted. IMPRESSION: No definite acute fracture or dislocation. Right elbow effusion. Electronically Signed   By: Fidela Salisbury M.D.   On: 09/08/2021 22:08  ? ?DG Wrist Complete Right ? ?Result Date: 09/08/2021 ?CLINICAL DATA:  Fall, right wrist pain EXAM: RIGHT WRIST - COMPLETE 3+ VIEW COMPARISON:  None. FINDINGS: Osseous structures are diffusely osteopenic. No acute fracture or dislocation.  Healed distal right radial fracture status post ORIF with residual mild dorsal tilt of the distal radial articular surface. Secondary mild-to-moderate radiocarpal degenerative arthritis. Remote nonunited fracture of t

## 2021-09-09 DIAGNOSIS — Z043 Encounter for examination and observation following other accident: Secondary | ICD-10-CM | POA: Diagnosis not present

## 2021-09-09 DIAGNOSIS — I700 Atherosclerosis of aorta: Secondary | ICD-10-CM | POA: Diagnosis not present

## 2021-09-09 DIAGNOSIS — S22070A Wedge compression fracture of T9-T10 vertebra, initial encounter for closed fracture: Secondary | ICD-10-CM | POA: Diagnosis not present

## 2021-09-09 DIAGNOSIS — G319 Degenerative disease of nervous system, unspecified: Secondary | ICD-10-CM | POA: Diagnosis not present

## 2021-09-09 DIAGNOSIS — S0990XA Unspecified injury of head, initial encounter: Secondary | ICD-10-CM | POA: Diagnosis not present

## 2021-09-09 DIAGNOSIS — Z8739 Personal history of other diseases of the musculoskeletal system and connective tissue: Secondary | ICD-10-CM | POA: Diagnosis not present

## 2021-09-09 MED ORDER — HYDROCODONE-ACETAMINOPHEN 5-325 MG PO TABS: 1.0000 | ORAL_TABLET | ORAL | 0 refills | Status: DC | PRN

## 2021-09-12 DIAGNOSIS — M25521 Pain in right elbow: Secondary | ICD-10-CM | POA: Diagnosis not present

## 2021-09-19 ENCOUNTER — Other Ambulatory Visit: Payer: Self-pay | Admitting: Cardiovascular Disease

## 2021-09-26 DIAGNOSIS — M25521 Pain in right elbow: Secondary | ICD-10-CM | POA: Diagnosis not present

## 2021-09-28 ENCOUNTER — Ambulatory Visit (INDEPENDENT_AMBULATORY_CARE_PROVIDER_SITE_OTHER): Payer: Medicare HMO | Admitting: Family

## 2021-09-28 ENCOUNTER — Encounter: Payer: Self-pay | Admitting: Family

## 2021-09-28 VITALS — BP 120/74 | HR 56 | Temp 97.5°F | Resp 16 | Ht 64.0 in

## 2021-09-28 DIAGNOSIS — M5136 Other intervertebral disc degeneration, lumbar region: Secondary | ICD-10-CM | POA: Diagnosis not present

## 2021-09-28 DIAGNOSIS — F411 Generalized anxiety disorder: Secondary | ICD-10-CM

## 2021-09-28 DIAGNOSIS — M51369 Other intervertebral disc degeneration, lumbar region without mention of lumbar back pain or lower extremity pain: Secondary | ICD-10-CM

## 2021-09-28 DIAGNOSIS — I700 Atherosclerosis of aorta: Secondary | ICD-10-CM

## 2021-09-28 DIAGNOSIS — D649 Anemia, unspecified: Secondary | ICD-10-CM | POA: Diagnosis not present

## 2021-09-28 DIAGNOSIS — F3341 Major depressive disorder, recurrent, in partial remission: Secondary | ICD-10-CM | POA: Diagnosis not present

## 2021-09-28 DIAGNOSIS — I4819 Other persistent atrial fibrillation: Secondary | ICD-10-CM | POA: Diagnosis not present

## 2021-09-28 DIAGNOSIS — I10 Essential (primary) hypertension: Secondary | ICD-10-CM | POA: Diagnosis not present

## 2021-09-28 DIAGNOSIS — I509 Heart failure, unspecified: Secondary | ICD-10-CM | POA: Diagnosis not present

## 2021-09-28 DIAGNOSIS — E039 Hypothyroidism, unspecified: Secondary | ICD-10-CM

## 2021-09-28 DIAGNOSIS — E876 Hypokalemia: Secondary | ICD-10-CM

## 2021-09-28 DIAGNOSIS — E7800 Pure hypercholesterolemia, unspecified: Secondary | ICD-10-CM | POA: Diagnosis not present

## 2021-09-28 DIAGNOSIS — M810 Age-related osteoporosis without current pathological fracture: Secondary | ICD-10-CM | POA: Diagnosis not present

## 2021-09-28 MED ORDER — LEVOTHYROXINE SODIUM 125 MCG PO TABS: 125.0000 ug | ORAL_TABLET | Freq: Every day | ORAL | 3 refills | Status: DC

## 2021-09-28 NOTE — Progress Notes (Signed)
? ?Provider: Marlowe Sax FNP-C  ? ?William Laske, Nelda Bucks, NP ? ?Patient Care Team: ?Patirica Longshore, Nelda Bucks, NP as PCP - General (Family Medicine) ?Croitoru, Dani Gobble, MD as Consulting Physician (Cardiology) ?Martinique, Amy, MD as Consulting Physician (Dermatology) ? ?Extended Emergency Contact Information ?Primary Emergency Contact: Arvanitis,[name] ?Address: [address] ?         [place] [address] [place] ?Home Phone: [phone] ?Mobile Phone: [phone] ?Relation: Son ?Secondary Emergency Contact: [name],[name] ? [place] ?Mobile Phone: [phone] ?Relation: Granddaughter ? ?Code Status:  Full code  ?Goals of care: Advanced Directive information ? ?  09/28/2021  ? 10:48 AM  ?Advanced Directives  ?Does Patient Have a Medical Advance Directive? No  ?Would patient like information on creating a medical advance directive? No - Patient declined  ? ? ? ?Chief Complaint  ?Patient presents with  ? Medical Management of Chronic Issues  ?  6 month follow up.  ? Immunizations  ?  Discuss the need for Covid Booster.  ? Concern   ?  Moderate Fall Risk . Patient had fall 09/08/2021. Fractured bone in right elbow.  ? ? ?HPI:  ?Pt is a 81 y.o. female seen today for 6 months for medical management of chronic diseases.  Has medical history of arterial fibrillation, hypertension, hypothyroidism, iron deficiency anemia, GERD, hyperlipidemia, depression and anxiety among other conditions ?She denies any acute issues during visit. ?States continues to require Xanax but takes xanax 1/2 tablet sometimes at night but not every day. ?She reports ED visit 09/08/2021 for fall states she is stumble while walking in her backyard and fell backwards she tried to catch herself when she fell and injured her right arm.  Also complaining of back.  She did not have any numbness tingling weakness of extremities. ?Had several x-rays done of her right elbow effusion with an underlying radial fracture noted CT or MRI imaging was  recommended. Right wrist x-ray was negative. ? ?Thoracic spine x-ray showed severe T9 compression deformity age-indeterminate but suspect acute to subacute given that does not paravertebral soft tissue swelling.  Also stable mild compression deformity at T8 and osteopenia and noted. ?Head CT scan showed no acute intracranial abnormalities but had chronic microvascular ischemia and generalized atrophy. ? ?CT of the thoracic spine showed wedge compression fracture of T9 with 75% anterior height loss likely acute no involvement of the posterior elements noted. ?Aortic atherosclerosis also noted. ? ?CT scan of the right elbow done showed limited evaluation due to severe osteopenia but limited due to severe Osteopenia but small acute nondisplaced fracture of the anterior lateral condylyle / capitellum and moderate hemoarthrosis ?Her fractures were suspected to be chronic since she did not complain of any pain.  Neurosurgery follow-up on outpatient recommended.  Right elbow was placed on a sling and advised to follow-up with orthopedic surgeon Dr. Percell Miller.  ?States pain is well controlled. ? ?She is fasting for lab work today ?Past Medical History:  ?Diagnosis Date  ? Anxiety   ? Atrial fibrillation with rapid ventricular response ([place]) 07/07/2015  ? Bilateral breast cancer ([place])   ? Daily headache   ? Depression   ? GERD (gastroesophageal reflux disease)   ? Hyperlipidemia   ? Hypertension   ? Hypothyroidism   ? Iron deficiency anemia 2000s  ? Lumbago   ? Nausea alone   ? Senile osteoporosis   ? Squamous cell carcinoma of forehead   ? "I think they burned it off"  ? ?Past Surgical History:  ?  Procedure Laterality Date  ? ABDOMINAL HYSTERECTOMY  1970s  ? BREAST BIOPSY Bilateral   ? BREAST LUMPECTOMY Right 2002  ? "w/lymph nodes removed"  ? CARDIOVERSION N/A 08/19/2015  ? Procedure: CARDIOVERSION;  Surgeon: Sanda Klein, MD;  Location: [facility];  Service: Cardiovascular;  Laterality: N/A;  ? CATARACT EXTRACTION W/  INTRAOCULAR LENS  IMPLANT, BILATERAL Bilateral 2000s  ? DILATION AND CURETTAGE OF UTERUS  ~ 1968  ? ELBOW FRACTURE SURGERY Left 2011  ? "has 2 screws in it"  ? EXCISIONAL HEMORRHOIDECTOMY  ~ 1970  ? FRACTURE SURGERY  02/12/2016  ? Right Wrist  ? HEMIARTHROPLASTY HIP Left 2011  ? "had ball replaced"  ? INGUINAL HERNIA REPAIR Right 1980s?  ? MASTECTOMY Left 2005  ? ORIF WRIST FRACTURE Left 02/12/2016  ? Procedure: OPEN REDUCTION INTERNAL FIXATION LEFT DISTAL RADIUS;  Surgeon: Dayna Barker, MD;  Location: [facility];  Service: Plastics;  Laterality: Left;  ? TOTAL THYROIDECTOMY  1972  ? WRIST FRACTURE SURGERY Right   ? "has a plate and 7 screws in there"  ? ? ?No Known Allergies ? ?Allergies as of 09/28/2021   ?No Known Allergies ?  ? ?  ?Medication List  ?  ? ?  ? Accurate as of Sep 28, 2021 10:54 AM. If you have any questions, ask your nurse or doctor.  ?  ?  ? ?  ? ?STOP taking these medications   ? ?HYDROcodone-acetaminophen 5-325 MG tablet ?Commonly known as: NORCO/VICODIN ?Stopped by: Sandrea Hughs, NP ?  ? ?  ? ?TAKE these medications   ? ?acetaminophen 325 MG tablet ?Commonly known as: TYLENOL ?Take 2 tablets (650 mg total) by mouth every 4 (four) hours as needed for headache or mild pain. ?  ?albuterol 108 (90 Base) MCG/ACT inhaler ?Commonly known as: VENTOLIN HFA ?Inhale 1 puff into the lungs 3 (three) times daily as needed for shortness of breath. ?  ?ALPRAZolam 0.5 MG tablet ?Commonly known as: Duanne Moron ?TAKE 1 TABLET BY MOUTH DAILY FOR ANXIETY ?  ?amLODipine 2.5 MG tablet ?Commonly known as: NORVASC ?Take 1 tablet (2.5 mg total) by mouth daily. ?  ?Eliquis 5 MG Tabs tablet ?Generic drug: apixaban ?TAKE 1 TABLET BY MOUTH TWICE DAILY ?  ?furosemide 40 MG tablet ?Commonly known as: LASIX ?TAKE 1 TABLET BY MOUTH DAILY ?  ?gabapentin 100 MG capsule ?Commonly known as: NEURONTIN ?TAKE 1 CAPSULE BY MOUTH DAILY AT BEDTIME ?  ?ibuprofen 200 MG tablet ?Commonly known as: ADVIL ?Take 200 mg by mouth daily as needed for  headache or mild pain. ?  ?levothyroxine 125 MCG tablet ?Commonly known as: SYNTHROID ?Take 1 tablet (125 mcg total) by mouth daily. ?  ?meclizine 12.5 MG tablet ?Commonly known as: ANTIVERT ?Take 12.5 mg by mouth as needed for dizziness (OTC for dizziness). ?  ?metoprolol tartrate 50 MG tablet ?Commonly known as: LOPRESSOR ?TAKE 1 TABLET BY MOUTH TWICE DAILY ?  ?potassium chloride SA 20 MEQ tablet ?Commonly known as: KLOR-CON M ?Take 2 tablets (40 mEq total) by mouth daily. ?  ?rosuvastatin 10 MG tablet ?Commonly known as: CRESTOR ?TAKE 1 TABLET BY MOUTH DAILY ?  ?sertraline 25 MG tablet ?Commonly known as: ZOLOFT ?TAKE 2 TABLETS BY MOUTH DAILY ?  ?Vitamin D3 50 MCG (2000 UT) Tabs ?Take 1 tablet by mouth daily. ?  ? ?  ? ? ?Review of Systems  ?Constitutional:  Negative for appetite change, chills, fatigue, fever and unexpected weight change.  ?HENT:  Negative for congestion, dental problem,  ear discharge, ear pain, facial swelling, hearing loss, nosebleeds, postnasal drip, rhinorrhea, sinus pressure, sinus pain, sneezing, sore throat, tinnitus and trouble swallowing.   ?Eyes:  Negative for pain, discharge, redness, itching and visual disturbance.  ?Respiratory:  Negative for cough, chest tightness, shortness of breath and wheezing.   ?Cardiovascular:  Negative for chest pain, palpitations and leg swelling.  ?Gastrointestinal:  Negative for abdominal distention, abdominal pain, blood in stool, constipation, diarrhea, nausea and vomiting.  ?Endocrine: Negative for cold intolerance, heat intolerance, polydipsia, polyphagia and polyuria.  ?Genitourinary:  Negative for difficulty urinating, dysuria, flank pain, frequency and urgency.  ?Musculoskeletal:  Positive for arthralgias, back pain and gait problem. Negative for joint swelling, myalgias, neck pain and neck stiffness.  ?     [place] 09/08/[address]21 on [address] sustained right elbow fracture   ?Skin:  Negative for color change, pallor, rash and wound.   ?Neurological:  Negative for syncope, speech difficulty, weakness, light-headedness, numbness and headaches.  ?     Dizzy at times   ?Hematological:  Does not bruise/bleed easily.  ?Psychiatric/Behavioral:  Negative for agit

## 2021-09-29 ENCOUNTER — Ambulatory Visit: Payer: Medicare HMO | Admitting: Family

## 2021-09-29 LAB — BASIC METABOLIC PANEL
BUN/Creatinine Ratio: 11 (calc) (ref 6–22)
BUN: 15 mg/dL (ref 7–25)
CO2: 30 mmol/L (ref 20–32)
Calcium: 9.2 mg/dL (ref 8.6–10.4)
Chloride: 101 mmol/L (ref 98–110)
Creat: 1.39 mg/dL — ABNORMAL HIGH (ref 0.60–0.95)
Glucose, Bld: 95 mg/dL (ref 65–99)
Potassium: 3.5 mmol/L (ref 3.5–5.3)
Sodium: 143 mmol/L (ref 135–146)

## 2021-09-29 LAB — CBC WITH DIFFERENTIAL/PLATELET
Absolute Monocytes: 707 cells/uL (ref 200–950)
Basophils Absolute: 91 cells/uL (ref 0–200)
Basophils Relative: 1.3 %
Eosinophils Absolute: 203 cells/uL (ref 15–500)
Eosinophils Relative: 2.9 %
HCT: 34.8 % — ABNORMAL LOW (ref 35.0–45.0)
Hemoglobin: 10.3 g/dL — ABNORMAL LOW (ref 11.7–15.5)
Lymphs Abs: 2233 cells/uL (ref 850–3900)
MCH: 23.1 pg — ABNORMAL LOW (ref 27.0–33.0)
MCHC: 29.6 g/dL — ABNORMAL LOW (ref 32.0–36.0)
MCV: 78 fL — ABNORMAL LOW (ref 80.0–100.0)
MPV: 9.9 fL (ref 7.5–12.5)
Monocytes Relative: 10.1 %
Neutro Abs: 3766 cells/uL (ref 1500–7800)
Neutrophils Relative %: 53.8 %
Platelets: 171 10*3/uL (ref 140–400)
RBC: 4.46 10*6/uL (ref 3.80–5.10)
RDW: 16.4 % — ABNORMAL HIGH (ref 11.0–15.0)
Total Lymphocyte: 31.9 %
WBC: 7 10*3/uL (ref 3.8–10.8)

## 2021-09-29 LAB — LIPID PANEL
Cholesterol: 109 mg/dL (ref <200)
HDL: 48 mg/dL — ABNORMAL LOW (ref >=50)
LDL Cholesterol (Calc): 45 mg/dL (calc)
Non-HDL Cholesterol (Calc): 61 mg/dL (calc) (ref <130)
Total CHOL/HDL Ratio: 2.3 (calc) (ref <5.0)
Triglycerides: 81 mg/dL (ref <150)

## 2021-09-29 LAB — IRON,TIBC AND FERRITIN PANEL
%SAT: 7 % (calc) — ABNORMAL LOW (ref 16–45)
Ferritin: 10 ng/mL — ABNORMAL LOW (ref 16–288)
Iron: 33 ug/dL — ABNORMAL LOW (ref 45–160)
TIBC: 471 mcg/dL (calc) — ABNORMAL HIGH (ref 250–450)

## 2021-09-29 LAB — TSH: TSH: 3.95 mIU/L (ref 0.40–4.50)

## 2021-09-30 ENCOUNTER — Other Ambulatory Visit: Payer: Self-pay | Admitting: Cardiovascular Disease

## 2021-10-02 DIAGNOSIS — F411 Generalized anxiety disorder: Secondary | ICD-10-CM | POA: Insufficient documentation

## 2021-10-02 DIAGNOSIS — I700 Atherosclerosis of aorta: Secondary | ICD-10-CM | POA: Insufficient documentation

## 2021-10-06 ENCOUNTER — Other Ambulatory Visit: Payer: Self-pay

## 2021-10-06 ENCOUNTER — Other Ambulatory Visit: Payer: Self-pay | Admitting: Cardiovascular Disease

## 2021-10-06 MED ORDER — DOCUSATE SODIUM 100 MG PO CAPS: 100.0000 mg | ORAL_CAPSULE | Freq: Every day | ORAL | 0 refills | Status: AC

## 2021-10-06 MED ORDER — IRON (FERROUS SULFATE) 325 (65 FE) MG PO TABS: 325.0000 mg | ORAL_TABLET | Freq: Every day | ORAL | Status: AC

## 2021-10-17 NOTE — Progress Notes (Unsigned)
Cardiology Office Note:    Date:  10/19/2021   ID:  Deborah Randall, DOB Apr 20, 1941, MRN [id number]  PCP:  Sandrea Hughs, NP   [facility] Providers Cardiologist:  Sanda Klein, MD Cardiology APP:  Ledora Bottcher, [place]     Referring MD: Sandrea Hughs, NP   Chief Complaint  Patient presents with   Follow-up    Pre-syncope    History of Present Illness:    Deborah Randall is a 81 y.o. female with a hx of persistent atrial fibrillation, hypertension, hyperlipidemia, hypothyroidism, and edema.  She has recurrent but infrequent paroxysmal atrial fibrillation with RVR initially diagnosed in 2011 and is s/p cardioversion 08/19/2015.  Nuclear stress test in 2017 negative for reversible ischemia.  She has a history of left mastectomy and radiation.  Echocardiogram 09/30/2019 with preserved LVEF of 55 to 60%, normal PA pressure, severely dilated left atria, moderately dilated right atria, mild to moderate MR, and no AS.  She was last seen in clinic by Dr. Sallyanne Kuster 08/18/2020 and was doing well at that time.  She remains active and does her own housework.  She presents today for annual follow-up. She states she has good days and bad days - she is primarily bothered by back pain and dizziness/lightheadedness. She reports dizziness most days at some point, not necessarily when she gets up from a standing position.  She had a fall in April 2023 and fractures her right elbow. She states this was not due to dizziness, she just lost her balance and had nothing to hold on to - she could not get up and scooted in the yard for over an hour to get to the phone. At times, she has felt pre-syncopal.    BP today 118/78 I will stop amlodipine   Past Medical History:  Diagnosis Date   Anxiety    Atrial fibrillation with rapid ventricular response ([place]) 07/07/2015   Bilateral breast cancer (HCC)    Daily headache    Depression    GERD (gastroesophageal reflux disease)    Hyperlipidemia     Hypertension    Hypothyroidism    Iron deficiency anemia 2000s   Lumbago    Nausea alone    Senile osteoporosis    Squamous cell carcinoma of forehead    "I think they burned it off"    Past Surgical History:  Procedure Laterality Date   ABDOMINAL HYSTERECTOMY  1970s   BREAST BIOPSY Bilateral    BREAST LUMPECTOMY Right 2002   "w/lymph nodes removed"   CARDIOVERSION N/A 08/19/2015   Procedure: CARDIOVERSION;  Surgeon: Sanda Klein, MD;  Location: [facility];  Service: Cardiovascular;  Laterality: N/A;   CATARACT EXTRACTION W/ INTRAOCULAR LENS  IMPLANT, BILATERAL Bilateral 2000s   DILATION AND CURETTAGE OF UTERUS  ~ [place] Left 2011   "has 2 screws in it"   EXCISIONAL HEMORRHOIDECTOMY  ~ [place]  02/12/2016   Right Wrist   HEMIARTHROPLASTY HIP Left 2011   "had ball replaced"   INGUINAL HERNIA REPAIR Right 1980s?   MASTECTOMY Left 2005   ORIF WRIST FRACTURE Left 02/12/2016   Procedure: OPEN REDUCTION INTERNAL FIXATION LEFT DISTAL RADIUS;  Surgeon: Dayna Barker, MD;  Location: [facility];  Service: Plastics;  Laterality: Left;   TOTAL THYROIDECTOMY  1972   WRIST FRACTURE SURGERY Right    "has a plate and 7 screws in there"    Current Medications: Current Meds  Medication Sig  acetaminophen (TYLENOL) 325 MG tablet Take 2 tablets (650 mg total) by mouth every 4 (four) hours as needed for headache or mild pain.   albuterol (VENTOLIN HFA) 108 (90 Base) MCG/ACT inhaler Inhale 1 puff into the lungs 3 (three) times daily as needed for shortness of breath.   ALPRAZolam (XANAX) 0.5 MG tablet TAKE 1 TABLET BY MOUTH DAILY FOR ANXIETY   Cholecalciferol (VITAMIN D3) 2000 UNITS TABS Take 1 tablet by mouth daily.    docusate sodium (COLACE) 100 MG capsule Take 1 capsule (100 mg total) by mouth daily.   ELIQUIS 5 MG TABS tablet TAKE 1 TABLET BY MOUTH TWICE DAILY   furosemide (LASIX) 40 MG tablet TAKE 1 TABLET BY MOUTH DAILY   gabapentin (NEURONTIN) 100  MG capsule TAKE 1 CAPSULE BY MOUTH DAILY AT BEDTIME   ibuprofen (ADVIL) 200 MG tablet Take 200 mg by mouth daily as needed for headache or mild pain.   Iron, Ferrous Sulfate, 325 (65 Fe) MG TABS Take 325 mg by mouth daily.   levothyroxine (SYNTHROID) 125 MCG tablet Take 1 tablet (125 mcg total) by mouth daily.   meclizine (ANTIVERT) 12.5 MG tablet Take 12.5 mg by mouth as needed for dizziness (OTC for dizziness).   metoprolol tartrate (LOPRESSOR) 50 MG tablet TAKE 1 TABLET BY MOUTH TWICE DAILY   potassium chloride SA (KLOR-CON) 20 MEQ tablet Take 2 tablets (40 mEq total) by mouth daily.   rosuvastatin (CRESTOR) 10 MG tablet TAKE 1 TABLET BY MOUTH DAILY   sertraline (ZOLOFT) 25 MG tablet TAKE 2 TABLETS BY MOUTH DAILY   [DISCONTINUED] amLODipine (NORVASC) 2.5 MG tablet Take 1 tablet (2.5 mg total) by mouth daily. Keep scheduled appointment for further refills     Allergies:   Patient has no known allergies.   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   Tobacco Use   Smoking status: Never   Smokeless tobacco: Never  Vaping Use   Vaping Use: Never used  Substance and Sexual Activity   Alcohol use: No   Drug use: No   Sexual activity: Never  Other Topics Concern   Not on file  Social History Narrative   Lives alone   Widow   2 sons , one son decease, one son disable    Works at [employer] in [employer]   Enjoys going places with friends   Never smoked   Alcohol none   Exercise -walk, works in yard    Social Determinants of Radio broadcast assistant Strain: Not on Art therapist Insecurity: Not on file  Transportation Needs: Not on file  Physical Activity: Not on file  Stress: Not on file  Social Connections: Not on file     Family  History: The patient's family history includes Alzheimer's disease in her brother; COPD in her brother; Cancer in her father and sister; Diabetes in her brother; Heart attack in her brother; Heart disease in her sister; Hypertension in her sister; Mental illness in her son and son; Other in her brother, brother, and brother.  ROS:   Please see the history of present illness.     All other systems reviewed and are negative.  EKGs/Labs/Other Studies Reviewed:    The following  studies were reviewed today:  Echo 09/30/2019: 1. Left ventricular ejection fraction, by estimation, is 55 to 60%. The  left ventricle has normal function. The left ventricle has no regional  wall motion abnormalities. Left ventricular diastolic function could not  be evaluated.   2. Right ventricular systolic function is normal. The right ventricular  size is normal. There is normal pulmonary artery systolic pressure. The  estimated right ventricular systolic pressure is 74.1 mmHg.   3. Left atrial size was severely dilated.   4. Right atrial size was moderately dilated.   5. The mitral valve is grossly normal. Mild to moderate mitral valve  regurgitation. No evidence of mitral stenosis.   6. Tricuspid valve regurgitation is mild to moderate.   7. The aortic valve is tricuspid. Aortic valve regurgitation is not  visualized. Mild to moderate aortic valve sclerosis/calcification is  present, without any evidence of aortic stenosis.   8. The inferior vena cava is normal in size with greater than 50%  respiratory variability, suggesting right atrial pressure of 3 mmHg.   EKG:  EKG is  ordered today.  The ekg ordered today demonstrates atrial fibrillation with ventricular rate 71  Recent Labs: 03/31/2021: ALT 19 09/28/2021: BUN 15; Creat 1.39; Hemoglobin 10.3; Platelets 171; Potassium 3.5; Sodium 143; TSH 3.95  Recent Lipid Panel    Component Value Date/Time   CHOL 109 09/28/2021 1118   CHOL 155 11/22/2015 0858    TRIG 81 09/28/2021 1118   HDL 48 (L) 09/28/2021 1118   HDL 46 11/22/2015 0858   CHOLHDL 2.3 09/28/2021 1118   VLDL 17 07/08/2015 0535   LDLCALC 45 09/28/2021 1118     Risk Assessment/Calculations:    CHA2DS2-VASc Score = 4   This indicates a 4.8% annual risk of stroke. The patient's score is based upon: CHF History: 0 HTN History: 1 Diabetes History: 0 Stroke History: 0 Vascular Disease History: 0 Age Score: 2 Gender Score: 1          Physical Exam:    VS:  BP 118/78   Pulse 71   Ht '5\' 4"'$  (1.626 m)   Wt 148 lb 6.4 oz (67.3 kg)   SpO2 93%   BMI 25.47 kg/m     Wt Readings from Last 3 Encounters:  10/19/21 148 lb 6.4 oz (67.3 kg)  09/08/21 148 lb (67.1 kg)  03/31/21 143 lb (64.9 kg)     GEN: elderly female in NAD HEENT: Normal NECK: No JVD; No carotid bruits LYMPHATICS: No lymphadenopathy CARDIAC: irregular rhythm, regular rate RESPIRATORY:  Clear to auscultation without rales, wheezing or rhonchi  ABDOMEN: Soft, non-tender, non-distended MUSCULOSKELETAL:  No edema; No deformity  SKIN: Warm and dry NEUROLOGIC:  Alert and oriented x 3 PSYCHIATRIC:  Normal affect   ASSESSMENT:    1. PAF (paroxysmal atrial fibrillation) ([place])   2. Dizziness   3. Pre-syncope   4. Chronic anticoagulation   5. Chronic anemia   6. Hypercholesterolemia   7. Essential hypertension, benign   8. Poor dentition    PLAN:    In order of problems listed above:  Dizziness, pre-syncope Orthostatic vital were negative for hypotension. I will place a 14 day zio monitor to evaluate Afib rates. BP soft for 81 yo - will stop amlodipine. Would prefer to continue lopressor at current dose given well-controlled Afib.   PAF Continue beta-blocker and Eliquis EKG today with rate controlled Afib   Chronic anticoagulation Appropriately dosed on 5 mg Eliquis twice daily, although we will  need to monitor renal function and weight as her last creatinine was 1.39 and her weight was 67.1  kg.   Chronic anemia Recent hemoglobin stable at 10.3 No active bleeding   Hypertension Continue metoprolol Stop amlodipine   Hyperlipidemia Continue 10 mg Crestor 09/28/2021: Cholesterol 109; HDL 48; LDL Cholesterol (Calc) 45; Triglycerides 81 Excellent control on recent lipid panel   Poor dentition, broken teeth She does not yet have a dentist. She walks [employer] and carries groceries in, she does moderate housework at home. She denies chest pain. Suspect she will do well with extractions if she is not having any new or worsening symptoms. Would avoid epinephrine. Will reach out to pharmD for recommendations for eliquis hold.             Medication Adjustments/Labs and Tests Ordered: Current medicines are reviewed at length with the patient today.  Concerns regarding medicines are outlined above.  Orders Placed This Encounter  Procedures   LONG TERM MONITOR (3-14 DAYS)   EKG 12-Lead   No orders of the defined types were placed in this encounter.   Patient Instructions  Medication Instructions:  STOP Amlodipine  *If you need a refill on your cardiac medications before your next appointment, please call your pharmacy*   Lab Work: NONE ordered at this time of appointment   If you have labs (blood work) drawn today and your tests are completely normal, you will receive your results only by: [place] (if you have MyChart) OR A paper copy in the mail If you have any lab test that is abnormal or we need to change your treatment, we will call you to review the results.   Testing/Procedures:  Bryn Gulling- Long Term Monitor Instructions  Your physician has requested you wear a ZIO patch monitor for 14 days.  This is a single patch monitor. [employer] supplies one patch monitor per enrollment. Additional stickers are not available. Please do not apply patch if you will be having a Nuclear Stress Test,  Echocardiogram, Cardiac CT, MRI, or Chest Xray during the period  you would be wearing the  monitor. The patch cannot be worn during these tests. You cannot remove and re-apply the  ZIO XT patch monitor.  Your ZIO patch monitor will be mailed 3 day USPS to your address on file. It may take 3-5 days  to receive your monitor after you have been enrolled.  Once you have received your monitor, please review the enclosed instructions. Your monitor  has already been registered assigning a specific monitor serial # to you.  Billing and Patient Assistance Program Information  We have supplied [employer] with any of your insurance information on file for billing purposes. [employer] offers a sliding scale Patient Assistance Program for patients that do not have  insurance, or whose insurance does not completely cover the cost of the ZIO monitor.  You must apply for the Patient Assistance Program to qualify for this discounted rate.  To apply, please call [employer] at [phone], select option 4, select option 2, ask to apply for  Patient Assistance Program. Theodore Demark will ask your household income, and how many people  are in your household. They will quote your out-of-pocket cost based on that information.  [employer] will also be able to set up a 58-month interest-free payment plan if needed.  Applying the monitor   Shave hair from upper left chest.  Hold abrader disc by orange tab. Rub abrader in 40 strokes over the upper left chest as  indicated in your monitor instructions.  Clean area with 4 enclosed alcohol pads. Let dry.  Apply patch as indicated in monitor instructions. Patch will be placed under collarbone on left  side of chest with arrow pointing upward.  Rub patch adhesive wings for 2 minutes. Remove white label marked "1". Remove the white  label marked "2". Rub patch adhesive wings for 2 additional minutes.  While looking in a mirror, press and release button in center of patch. A small green light will  flash 3-4 times. This will be your only indicator  that the monitor has been turned on.  Do not shower for the first 24 hours. You may shower after the first 24 hours.  Press the button if you feel a symptom. You will hear a small click. Record Date, Time and  Symptom in the Patient Logbook.  When you are ready to remove the patch, follow instructions on the last 2 pages of Patient  Logbook. Stick patch monitor onto the last page of Patient Logbook.  Place Patient Logbook in the blue and white box. Use locking tab on box and tape box closed  securely. The blue and white box has prepaid postage on it. Please place it in the mailbox as  soon as possible. Your physician should have your test results approximately 7 days after the  monitor has been mailed back to [facility].  Call [place] at [phone] if you have questions regarding  your ZIO XT patch monitor. Call them immediately if you see an orange light blinking on your  monitor.  If your monitor falls off in less than 4 days, contact our Monitor department at [phone].  If your monitor becomes loose or falls off after 4 days call [employer] at [phone] for  suggestions on securing your monitor    Follow-Up: At [facility], you and your health needs are our priority.  As part of our continuing mission to provide you with exceptional heart care, we have created designated Provider Care Teams.  These Care Teams include your primary Cardiologist (physician) and Advanced Practice Providers (APPs -  Physician Assistants and Nurse Practitioners) who all work together to provide you with the care you need, when you need it.  We recommend signing up for the patient portal called "MyChart".  Sign up information is provided on this After Visit Summary.  MyChart is used to connect with patients for Virtual Visits (Telemedicine).  Patients are able to view lab/test results, encounter notes, upcoming appointments, etc.  Non-urgent messages can be sent to your provider  as well.   To learn more about what you can do with MyChart, go to [email or web address].    Your next appointment:   5-6 week(s)  The format for your next appointment:   In Person  Provider:   Fabian Sharp, PA-C        Other Instructions   Important Information About Sugar         Signed, Ledora Bottcher, [place]  10/19/2021 11:47 AM    [place]

## 2021-10-19 ENCOUNTER — Ambulatory Visit: Payer: Medicare HMO | Admitting: Physician Assistant

## 2021-10-19 ENCOUNTER — Ambulatory Visit (INDEPENDENT_AMBULATORY_CARE_PROVIDER_SITE_OTHER): Payer: Medicare HMO

## 2021-10-19 ENCOUNTER — Encounter: Payer: Self-pay | Admitting: Physician Assistant

## 2021-10-19 VITALS — BP 118/78 | HR 71 | Ht 64.0 in | Wt 148.4 lb

## 2021-10-19 DIAGNOSIS — D649 Anemia, unspecified: Secondary | ICD-10-CM

## 2021-10-19 DIAGNOSIS — E7800 Pure hypercholesterolemia, unspecified: Secondary | ICD-10-CM

## 2021-10-19 DIAGNOSIS — R42 Dizziness and giddiness: Secondary | ICD-10-CM

## 2021-10-19 DIAGNOSIS — I480 Paroxysmal atrial fibrillation: Secondary | ICD-10-CM | POA: Diagnosis not present

## 2021-10-19 DIAGNOSIS — I10 Essential (primary) hypertension: Secondary | ICD-10-CM

## 2021-10-19 DIAGNOSIS — K089 Disorder of teeth and supporting structures, unspecified: Secondary | ICD-10-CM | POA: Diagnosis not present

## 2021-10-19 DIAGNOSIS — R55 Syncope and collapse: Secondary | ICD-10-CM

## 2021-10-19 DIAGNOSIS — Z7901 Long term (current) use of anticoagulants: Secondary | ICD-10-CM | POA: Diagnosis not present

## 2021-10-19 NOTE — Progress Notes (Unsigned)
Enrolled for [employer] to mail a ZIO XT long term holter monitor to the patients address on file.   Dr. Croitoru to read. 

## 2021-10-19 NOTE — Patient Instructions (Signed)
Medication Instructions:  STOP Amlodipine  *If you need a refill on your cardiac medications before your next appointment, please call your pharmacy*   Lab Work: NONE ordered at this time of appointment   If you have labs (blood work) drawn today and your tests are completely normal, you will receive your results only by: [place] (if you have MyChart) OR A paper copy in the mail If you have any lab test that is abnormal or we need to change your treatment, we will call you to review the results.   Testing/Procedures:  Bryn Gulling- Long Term Monitor Instructions  Your physician has requested you wear a ZIO patch monitor for 14 days.  This is a single patch monitor. [employer] supplies one patch monitor per enrollment. Additional stickers are not available. Please do not apply patch if you will be having a Nuclear Stress Test,  Echocardiogram, Cardiac CT, MRI, or Chest Xray during the period you would be wearing the  monitor. The patch cannot be worn during these tests. You cannot remove and re-apply the  ZIO XT patch monitor.  Your ZIO patch monitor will be mailed 3 day USPS to your address on file. It may take 3-5 days  to receive your monitor after you have been enrolled.  Once you have received your monitor, please review the enclosed instructions. Your monitor  has already been registered assigning a specific monitor serial # to you.  Billing and Patient Assistance Program Information  We have supplied [employer] with any of your insurance information on file for billing purposes. [employer] offers a sliding scale Patient Assistance Program for patients that do not have  insurance, or whose insurance does not completely cover the cost of the ZIO monitor.  You must apply for the Patient Assistance Program to qualify for this discounted rate.  To apply, please call [employer] at [phone], select option 4, select option 2, ask to apply for  Patient Assistance Program. Theodore Demark will ask  your household income, and how many people  are in your household. They will quote your out-of-pocket cost based on that information.  [employer] will also be able to set up a 96-month interest-free payment plan if needed.  Applying the monitor   Shave hair from upper left chest.  Hold abrader disc by orange tab. Rub abrader in 40 strokes over the upper left chest as  indicated in your monitor instructions.  Clean area with 4 enclosed alcohol pads. Let dry.  Apply patch as indicated in monitor instructions. Patch will be placed under collarbone on left  side of chest with arrow pointing upward.  Rub patch adhesive wings for 2 minutes. Remove white label marked "1". Remove the white  label marked "2". Rub patch adhesive wings for 2 additional minutes.  While looking in a mirror, press and release button in center of patch. A small green light will  flash 3-4 times. This will be your only indicator that the monitor has been turned on.  Do not shower for the first 24 hours. You may shower after the first 24 hours.  Press the button if you feel a symptom. You will hear a small click. Record Date, Time and  Symptom in the Patient Logbook.  When you are ready to remove the patch, follow instructions on the last 2 pages of Patient  Logbook. Stick patch monitor onto the last page of Patient Logbook.  Place Patient Logbook in the blue and white box. Use locking tab on box and tape  box closed  securely. The blue and white box has prepaid postage on it. Please place it in the mailbox as  soon as possible. Your physician should have your test results approximately 7 days after the  monitor has been mailed back to [facility].  Call [place] at [phone] if you have questions regarding  your ZIO XT patch monitor. Call them immediately if you see an orange light blinking on your  monitor.  If your monitor falls off in less than 4 days, contact our Monitor department at  [phone].  If your monitor becomes loose or falls off after 4 days call [employer] at [phone] for  suggestions on securing your monitor    Follow-Up: At [facility], you and your health needs are our priority.  As part of our continuing mission to provide you with exceptional heart care, we have created designated Provider Care Teams.  These Care Teams include your primary Cardiologist (physician) and Advanced Practice Providers (APPs -  Physician Assistants and Nurse Practitioners) who all work together to provide you with the care you need, when you need it.  We recommend signing up for the patient portal called "MyChart".  Sign up information is provided on this After Visit Summary.  MyChart is used to connect with patients for Virtual Visits (Telemedicine).  Patients are able to view lab/test results, encounter notes, upcoming appointments, etc.  Non-urgent messages can be sent to your provider as well.   To learn more about what you can do with MyChart, go to [email or web address].    Your next appointment:   5-6 week(s)  The format for your next appointment:   In Person  Provider:   Fabian Sharp, PA-C        Other Instructions   Important Information About Sugar

## 2021-10-24 DIAGNOSIS — M25521 Pain in right elbow: Secondary | ICD-10-CM | POA: Diagnosis not present

## 2021-10-25 DIAGNOSIS — I480 Paroxysmal atrial fibrillation: Secondary | ICD-10-CM | POA: Diagnosis not present

## 2021-10-25 DIAGNOSIS — R55 Syncope and collapse: Secondary | ICD-10-CM

## 2021-10-25 DIAGNOSIS — R42 Dizziness and giddiness: Secondary | ICD-10-CM

## 2021-10-27 ENCOUNTER — Other Ambulatory Visit: Payer: Self-pay | Admitting: Cardiovascular Disease

## 2021-11-09 ENCOUNTER — Other Ambulatory Visit: Payer: Self-pay | Admitting: Nurse Practitioner

## 2021-11-10 ENCOUNTER — Other Ambulatory Visit: Payer: Self-pay

## 2021-11-10 DIAGNOSIS — M545 Low back pain, unspecified: Secondary | ICD-10-CM

## 2021-11-10 MED ORDER — GABAPENTIN 100 MG PO CAPS: ORAL_CAPSULE | ORAL | 1 refills | Status: DC

## 2021-11-17 DIAGNOSIS — I480 Paroxysmal atrial fibrillation: Secondary | ICD-10-CM | POA: Diagnosis not present

## 2021-11-17 DIAGNOSIS — R42 Dizziness and giddiness: Secondary | ICD-10-CM | POA: Diagnosis not present

## 2021-11-17 DIAGNOSIS — R55 Syncope and collapse: Secondary | ICD-10-CM | POA: Diagnosis not present

## 2021-11-24 ENCOUNTER — Ambulatory Visit
Admission: RE | Admit: 2021-11-24 | Discharge: 2021-11-24 | Disposition: A | Discharge status: Home / Self Care | Payer: Medicare HMO | Source: Ambulatory Visit | Attending: Family | Admitting: Family

## 2021-11-24 DIAGNOSIS — M810 Age-related osteoporosis without current pathological fracture: Secondary | ICD-10-CM | POA: Diagnosis not present

## 2021-11-24 DIAGNOSIS — Z780 Asymptomatic menopausal state: Secondary | ICD-10-CM | POA: Diagnosis not present

## 2021-11-27 NOTE — Progress Notes (Unsigned)
Cardiology Office Note:    Date:  11/27/2021   ID:  MERDIS SNODGRASS, DOB 03/20/1941, MRN [id number]  PCP:  Sandrea Hughs, NP   [place] Providers Cardiologist:  Sanda Klein, MD Cardiology APP:  Ledora Bottcher, PA { Click to update primary MD,subspecialty MD or APP then REFRESH:1}    Referring MD: Ngetich, Nelda Bucks, NP   No chief complaint on file. ***  History of Present Illness:    Deborah Randall is a 81 y.o. female with a hx of persistent atrial fibrillation, hypertension, hyperlipidemia, hypothyroidism, and edema.  She has recurrent but infrequent paroxysmal atrial fibrillation with RVR initially diagnosed in 2011 and is s/p cardioversion 08/19/2015.  Nuclear stress test in 2017 negative for reversible ischemia.  She has a history of left mastectomy and radiation.  Echocardiogram 09/30/2019 with preserved LVEF of 55 to 60%, normal PA pressure, severely dilated left atria, moderately dilated right atria, mild to moderate MR, and no AS.  She was last seen in clinic by Dr. Sallyanne Kuster 08/18/2020 and was doing well at that time.  She remains active and does her own housework.  I saw her in clinic 10/19/2021 for routine follow-up.  She reported dizziness and presyncope at that time.  Orthostatic vitals in the office were negative for hypotension.  I placed an 14-day ZIO monitor and stopped amlodipine for borderline blood pressure.  Heart monitor showed that her atrial fibrillation was well controlled with no significant slow or fast heart rates.  She presents today for follow up of the above.    Dizziness / presyncope Heart monitor with generally well controlled Afib Stopped 2.5 mg amlodipine at last visit   PAF Continue beta-blocker and Eliquis   Chronic anticoagulation Appropriately dosed on 5 mg Eliquis twice daily, although we will need to monitor renal function and weight as her last creatinine was 1.39 and her weight was 67.1 kg.   Chronic anemia Recent  hemoglobin stable at 10.3 No active bleeding   Hypertension Beta-blocker I stopped 2.5 mg amlodipine at last visit for SBP in the 110s and presyncope/dizziness   Hyperlipidemia Continue 10 mg Crestor 09/28/2021: Cholesterol 109; HDL 48; LDL Cholesterol (Calc) 45; Triglycerides 81 Excellent control on recent lipid panel   Poor dentition Cleared for dental procedures - can complete more than 4.0 METS without angina      Past Medical History:  Diagnosis Date   Anxiety    Atrial fibrillation with rapid ventricular response ([place]) 07/07/2015   Bilateral breast cancer (HCC)    Daily headache    Depression    GERD (gastroesophageal reflux disease)    Hyperlipidemia    Hypertension    Hypothyroidism    Iron deficiency anemia 2000s   Lumbago    Nausea alone    Senile osteoporosis    Squamous cell carcinoma of forehead    "I think they burned it off"    Past Surgical History:  Procedure Laterality Date   ABDOMINAL HYSTERECTOMY  1970s   BREAST BIOPSY Bilateral    BREAST LUMPECTOMY Right 2002   "w/lymph nodes removed"   CARDIOVERSION N/A 08/19/2015   Procedure: CARDIOVERSION;  Surgeon: Sanda Klein, MD;  Location: [facility];  Service: Cardiovascular;  Laterality: N/A;   CATARACT EXTRACTION W/ INTRAOCULAR LENS  IMPLANT, BILATERAL Bilateral 2000s   DILATION AND CURETTAGE OF UTERUS  ~ [place] Left 2011   "has 2 screws in it"   EXCISIONAL HEMORRHOIDECTOMY  ~ 1970  FRACTURE SURGERY  02/12/2016   Right Wrist   HEMIARTHROPLASTY HIP Left 2011   "had ball replaced"   [place] Right 1980s?   MASTECTOMY Left 2005   ORIF WRIST FRACTURE Left 02/12/2016   Procedure: OPEN REDUCTION INTERNAL FIXATION LEFT DISTAL RADIUS;  Surgeon: Dayna Barker, MD;  Location: [facility];  Service: Plastics;  Laterality: Left;   TOTAL THYROIDECTOMY  1972   WRIST FRACTURE SURGERY Right    "has a plate and 7 screws in there"    Current Medications: No outpatient  medications have been marked as taking for the 11/28/21 encounter (Appointment) with Ledora Bottcher, [place].     Allergies:   Patient has no known allergies.   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   Tobacco Use   Smoking status: Never   Smokeless tobacco: Never  Vaping Use   Vaping Use: Never used  Substance and Sexual Activity   Alcohol use: No   Drug use: No   Sexual activity: Never  Other Topics Concern   Not on file  Social History Narrative   Lives alone   Widow   2 sons , one son decease, one son disable    Works at [employer] in [employer]   Enjoys going places with friends   Never smoked   Alcohol none   Exercise -walk, works in [place] Strain: [place]  (05/16/2017)   Overall Financial Resource Strain (CARDIA)    Difficulty of Paying Living Expenses: Not hard at all  Food Insecurity: No [place] (05/16/2017)   Hunger Vital Sign    Worried About Running Out of Food in the Last Year: Never true    [place] in the Last Year: Never true  Transportation Needs: No Transportation Needs (05/16/2017)   PRAPARE - Hydrologist (Medical): No    Lack of Transportation (Non-Medical): No  Physical Activity: Inactive (05/16/2017)   Exercise Vital Sign    Days of Exercise per Week: 0 days    Minutes of Exercise per Session: 0 min  Stress: No Stress Concern Present (05/16/2017)   [place]    Feeling of Stress : Not at all  Social Connections: Moderately Integrated (05/16/2017)   Social Connection and Isolation Panel [NHANES]    Frequency of Communication with Friends and  Family: More than three times a week    Frequency of Social Gatherings with Friends and Family: Three times a week    Attends Religious Services: 1 to 4 times per year    Active Member of Clubs or Organizations: Yes    Attends Archivist Meetings: Never    Marital Status: Widowed     Family History: The patient's ***family history includes Alzheimer's disease in her brother; COPD in her brother; Cancer in her father and sister; Diabetes in her brother; Heart attack in her brother; Heart disease in her sister; Hypertension in her sister; Mental illness in her son and son; Other in her brother, brother, and brother.  ROS:   Please see the history of present illness.    ***  All other systems reviewed and are negative.  EKGs/Labs/Other Studies Reviewed:    The following studies were reviewed today:  Heart monitor 10/2021: The dominant rhythm is atrial fibrillation with mostly well controlled ventricular rates. Episodes of mild slow ventricular rates are seen at night and mild rapid ventricular response is seen during activity.    Persistent atrial fibrillation with well-controlled ventricular rates.     Patch Wear Time:  14 days and 0 hours (2023-06-06T12:31:41-0400 to 2023-06-20T12:31:45-0400)   Atrial Fibrillation occurred continuously (100% burden), ranging from 46-140 bpm (avg of 75 bpm). Isolated VEs were rare (<1.0%), VE Couplets were rare (<1.0%), and no VE Triplets were present.  EKG:  EKG is *** ordered today.  The ekg ordered today demonstrates ***  Recent Labs: 03/31/2021: ALT 19 09/28/2021: BUN 15; Creat 1.39; Hemoglobin 10.3; Platelets 171; Potassium 3.5; Sodium 143; TSH 3.95  Recent Lipid Panel    Component Value Date/Time   CHOL 109 09/28/2021 1118   CHOL 155 11/22/2015 0858   TRIG 81 09/28/2021 1118   HDL 48 (L) 09/28/2021 1118   HDL 46 11/22/2015 0858   CHOLHDL 2.3 09/28/2021 1118   VLDL 17 07/08/2015 0535   LDLCALC 45 09/28/2021 1118     Risk  Assessment/Calculations:   {Does this patient have ATRIAL FIBRILLATION?:[phone]}       Physical Exam:    VS:  There were no vitals taken for this visit.    Wt Readings from Last 3 Encounters:  10/19/21 148 lb 6.4 oz (67.3 kg)  09/08/21 148 lb (67.1 kg)  03/31/21 143 lb (64.9 kg)     GEN: *** Well nourished, well developed in no acute distress HEENT: Normal NECK: No JVD; No carotid bruits LYMPHATICS: No lymphadenopathy CARDIAC: ***RRR, no murmurs, rubs, gallops RESPIRATORY:  Clear to auscultation without rales, wheezing or rhonchi  ABDOMEN: Soft, non-tender, non-distended MUSCULOSKELETAL:  No edema; No deformity  SKIN: Warm and dry NEUROLOGIC:  Alert and oriented x 3 PSYCHIATRIC:  Normal affect   ASSESSMENT:    No diagnosis found. PLAN:    In order of problems listed above:  ***      {Are you ordering a CV Procedure (e.g. stress test, cath, DCCV, TEE, etc)?   Press F2        :[id number]}    Medication Adjustments/Labs and Tests Ordered: Current medicines are reviewed at length with the patient today.  Concerns regarding medicines are outlined above.  No orders of the defined types were placed in this encounter.  No orders of the defined types were placed in this encounter.   There are no Patient Instructions on file for this visit.   Signed, Ledora Bottcher, [place]  11/27/2021 7:57 PM    [facility]

## 2021-11-28 ENCOUNTER — Other Ambulatory Visit: Payer: Self-pay | Admitting: Nurse Practitioner

## 2021-11-28 ENCOUNTER — Ambulatory Visit: Payer: Medicare HMO | Admitting: Physician Assistant

## 2021-11-28 ENCOUNTER — Encounter: Payer: Self-pay | Admitting: Physician Assistant

## 2021-11-28 VITALS — BP 127/81 | HR 72 | Ht 64.0 in | Wt 144.2 lb

## 2021-11-28 DIAGNOSIS — Z7901 Long term (current) use of anticoagulants: Secondary | ICD-10-CM | POA: Diagnosis not present

## 2021-11-28 DIAGNOSIS — I4819 Other persistent atrial fibrillation: Secondary | ICD-10-CM

## 2021-11-28 DIAGNOSIS — I10 Essential (primary) hypertension: Secondary | ICD-10-CM | POA: Diagnosis not present

## 2021-11-28 DIAGNOSIS — E7800 Pure hypercholesterolemia, unspecified: Secondary | ICD-10-CM

## 2021-11-28 DIAGNOSIS — R55 Syncope and collapse: Secondary | ICD-10-CM | POA: Diagnosis not present

## 2021-11-28 DIAGNOSIS — D649 Anemia, unspecified: Secondary | ICD-10-CM | POA: Diagnosis not present

## 2021-11-28 DIAGNOSIS — F3289 Other specified depressive episodes: Secondary | ICD-10-CM

## 2021-11-28 DIAGNOSIS — R42 Dizziness and giddiness: Secondary | ICD-10-CM | POA: Diagnosis not present

## 2021-11-28 NOTE — Telephone Encounter (Signed)
High risk warning populated when attempting to approve refill request. Will send to Ngetich, Dinah C, NP to review.

## 2021-11-28 NOTE — Patient Instructions (Signed)
Medication Instructions:  No Changes *If you need a refill on your cardiac medications before your next appointment, please call your pharmacy*   Lab Work: No Labs If you have labs (blood work) drawn today and your tests are completely normal, you will receive your results only by: [place] (if you have MyChart) OR A paper copy in the mail If you have any lab test that is abnormal or we need to change your treatment, we will call you to review the results.   Testing/Procedures: No Testing   Follow-Up: At [facility], you and your health needs are our priority.  As part of our continuing mission to provide you with exceptional heart care, we have created designated Provider Care Teams.  These Care Teams include your primary Cardiologist (physician) and Advanced Practice Providers (APPs -  Physician Assistants and Nurse Practitioners) who all work together to provide you with the care you need, when you need it.  We recommend signing up for the patient portal called "MyChart".  Sign up information is provided on this After Visit Summary.  MyChart is used to connect with patients for Virtual Visits (Telemedicine).  Patients are able to view lab/test results, encounter notes, upcoming appointments, etc.  Non-urgent messages can be sent to your provider as well.   To learn more about what you can do with MyChart, go to [email or web address].    Your next appointment:   6 month(s)  The format for your next appointment:   In Person  Provider:   Sanda Klein, MD       Important Information About Sugar

## 2021-11-29 ENCOUNTER — Other Ambulatory Visit: Payer: Self-pay

## 2022-01-15 IMAGING — CT CT ABD-PELV W/ CM
2 of 5 series · 15 of 46 positions shown, 17 images · IV contrast (Omni 300)
Comparison: None.

CLINICAL DATA: Right low back pain radiating up to her right
shoulder. Evaluate for stone, abscess.

EXAM:
CT ABDOMEN AND PELVIS WITH CONTRAST
TECHNIQUE: Multidetector CT imaging of the abdomen and pelvis was performed
using the standard protocol following bolus administration of
intravenous contrast.
CONTRAST:  100mL OMNIPAQUE IOHEXOL 350 MG/ML SOLN

[Series 3: a/p w/ 5mm · axial · 0.68mm/px · z∈[-492,-142]mm · 12 of 80 slices shown, 14 images]
[im 5/80  soft-tissue]
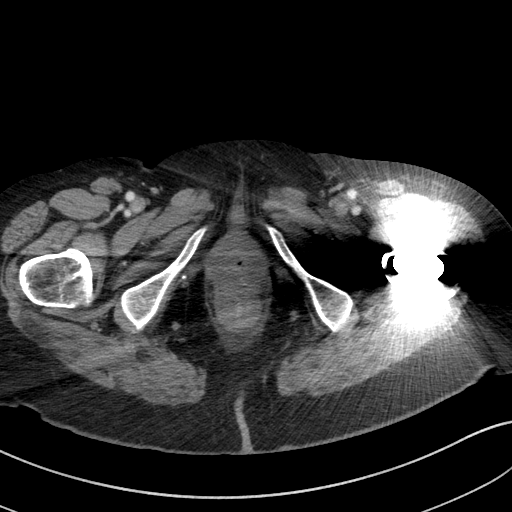
[im 5/80  bone]
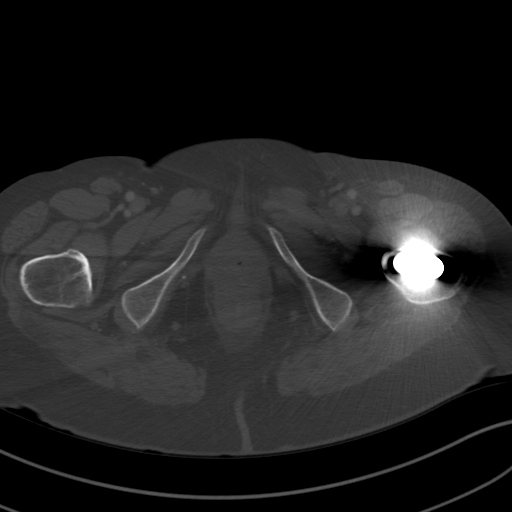
[im 13/80  soft-tissue]
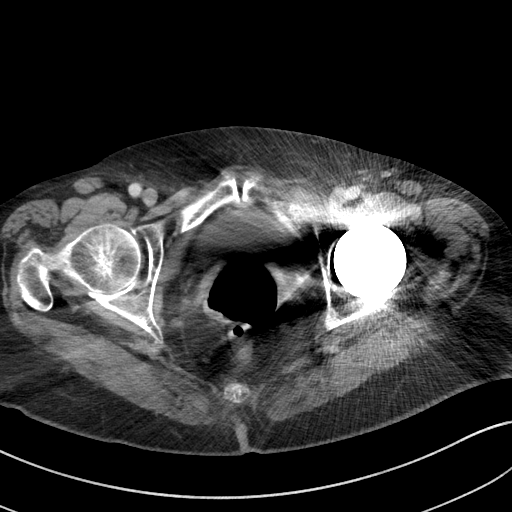
[im 17/80  soft-tissue]
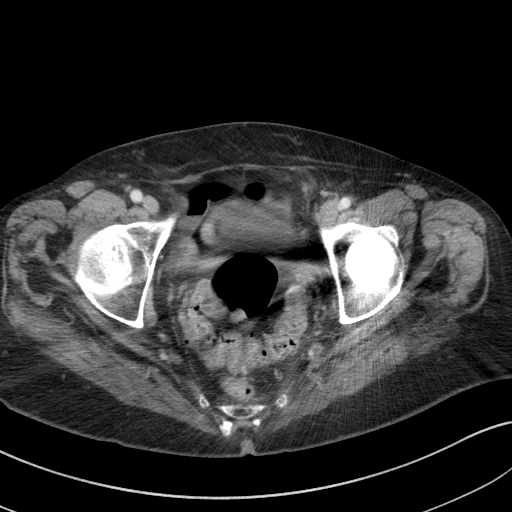
[im 25/80  soft-tissue]
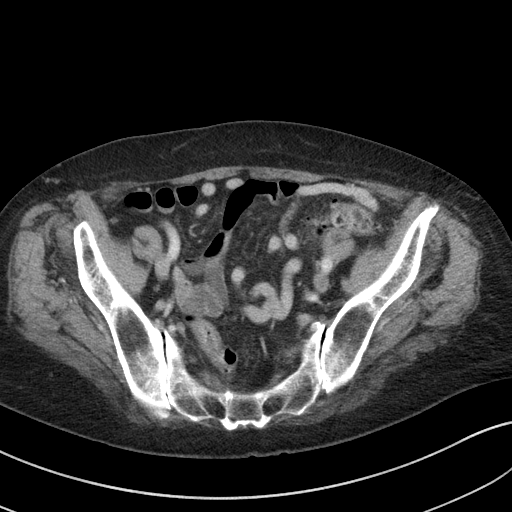
[im 30/80  soft-tissue]
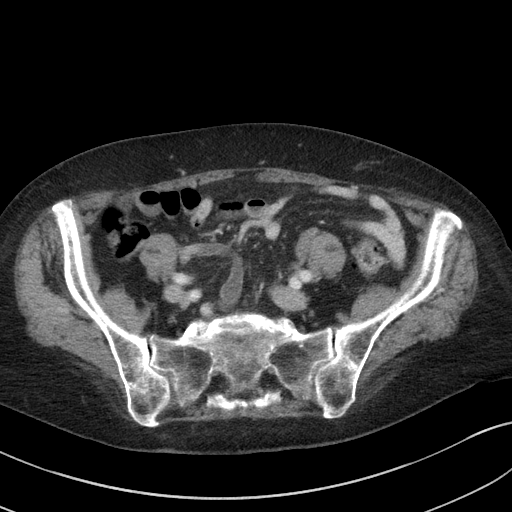
[im 38/80  soft-tissue]
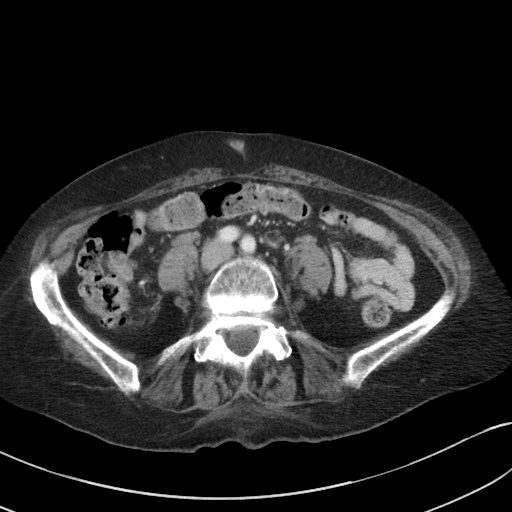
[im 42/80  soft-tissue]
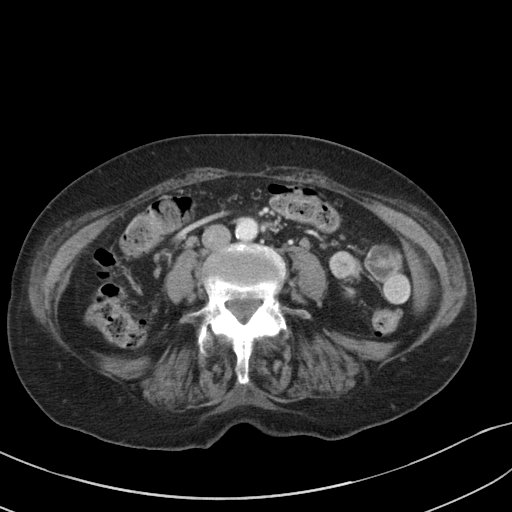
[im 50/80  soft-tissue]
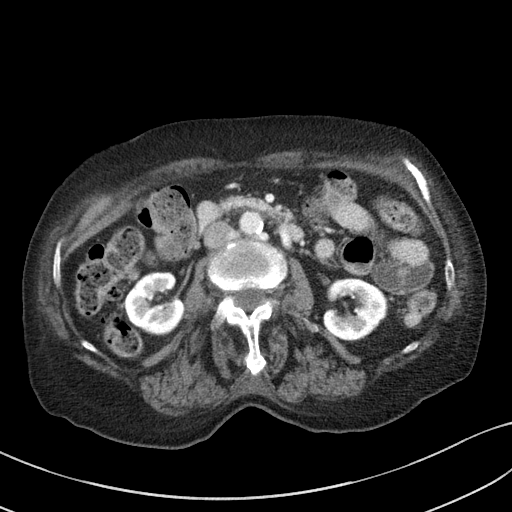
[im 55/80  soft-tissue]
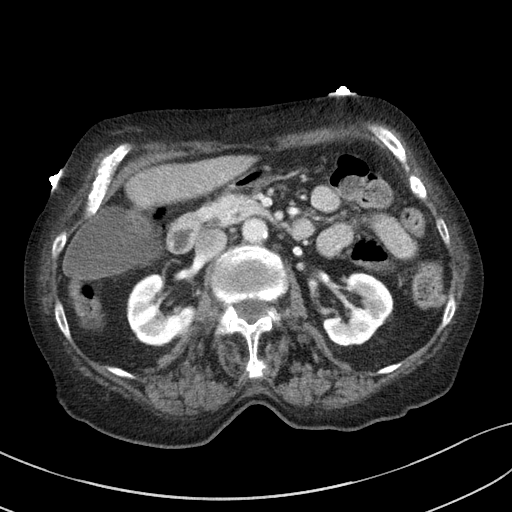
[im 55/80  bone]
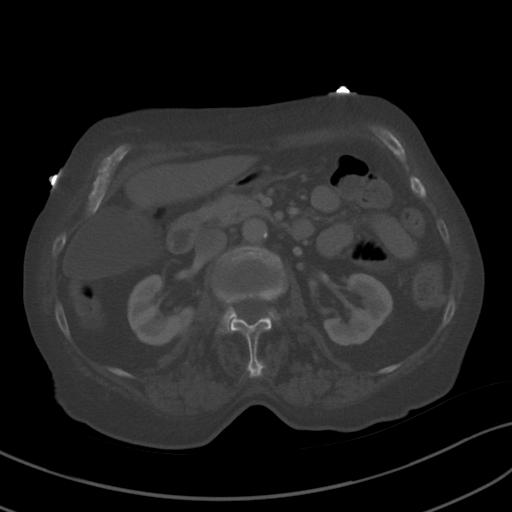
[im 63/80  soft-tissue]
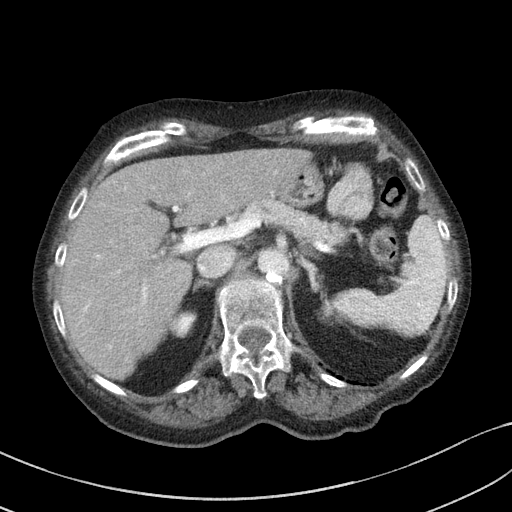
[im 67/80  soft-tissue]
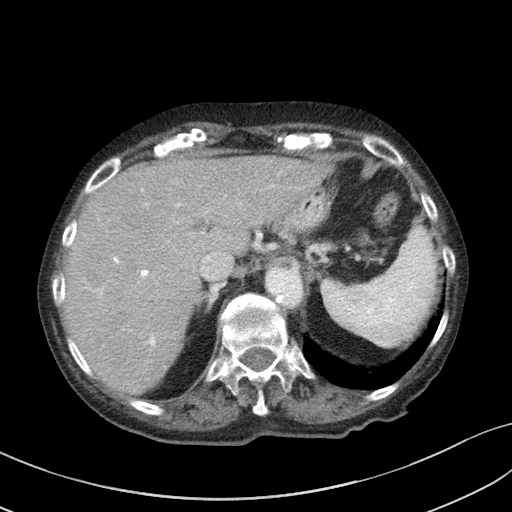
[im 75/80  soft-tissue]
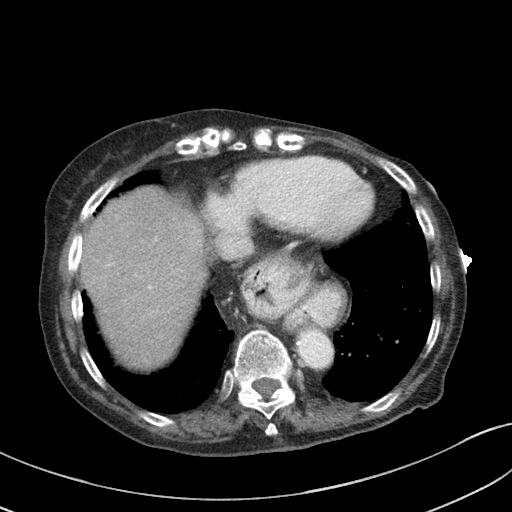

[Series 6: a/p w/ cor · coronal · 0.78mm/px · 3 of 132 slices shown]
[im 44/132  soft-tissue]
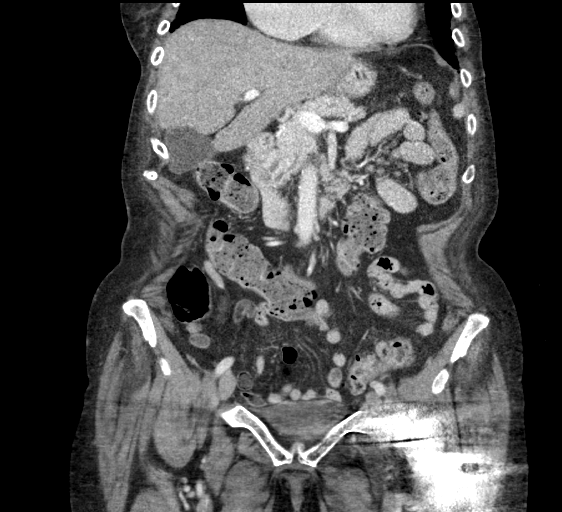
[im 59/132  soft-tissue]
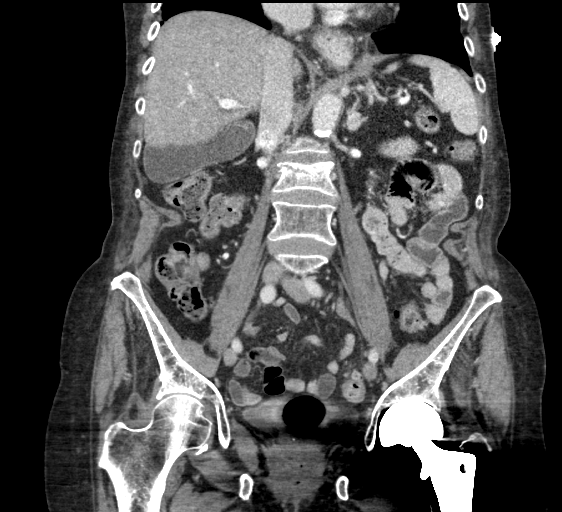
[im 73/132  soft-tissue]
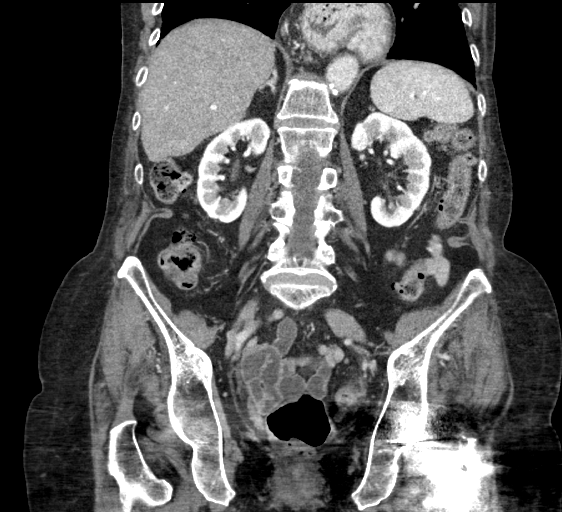

[15 of 46 positions shown; findings below may reference images not displayed]

FINDINGS: Lower chest: No acute abnormality. Subtle reticulation at the lung
bases. Subpleural cystic changes noted the anterolateral aspect of
the right lower lobe.

Hepatobiliary: No focal liver abnormality. Focal fluid density wall
thickening at the neck of the gallbladder, which could represent
focal adenomyomatosis (series 6, image 58; series 7, image 64). No
gallbladder wall thickening or pericholecystic fluid. No bile duct
dilatation.

Pancreas: A 2.2 cm oval hypoechoic lesion is noted at the tail of
the pancreas (series 3, image 17; series 6, image 54). No dilatation
of the main pancreatic duct.

Spleen: Normal in size without focal abnormality.

Adrenals/Urinary Tract: Adrenal glands are unremarkable. Kidneys are
normal, without renal calculi or hydronephrosis. A 0.5 cm partially
exophytic round hypodense lesion noted at the lower pole of the left
kidney (series 6, image 69). This is too small to characterize
though likely represents a benign cyst. The bladder is
unremarkable.

Stomach/Bowel: Partially visualized large hiatal hernia with the
majority of the stomach within the thoracic cavity. Normal appendix.
Diverticulosis involving the ascending, descending, and sigmoid
colon. No bowel wall thickening, obstruction, or adjacent
inflammatory changes. Mild stool burden with majority of the stool
in the ascending and transverse colon.

Vascular/Lymphatic: Abdominal aortic calcific atherosclerosis. No
abdominal aortic aneurysm. No enlarged abdominal or pelvic lymph
nodes.

Reproductive: Hysterectomy.

Other: Small fat containing umbilical hernia.  Left mastectomy.

Musculoskeletal: Interval compression fracture of the L3 vertebral
body superior endplate with approximately 25% vertebral body height
loss. No retropulsion. Left hip prosthesis this is intact and in
appropriate position.
IMPRESSION: 1. Since prior lumbar spine radiographs performed 11/15/2020, there
has been an interval compression fracture of the L3 vertebral body
superior endplate with approximately 25% vertebral height loss.
2. No acute abnormality in the abdomen or pelvis.
3. A 2.2 cm hypoattenuating lesion at the tail of the pancreas may
represent an IPMN. Recommend further evaluation with MRCP on a
non-emergent basis.
4. Probable focal adenomyomatosis at the gallbladder neck, which can
also be evaluated on MRCP.
5. Diverticulosis of the ascending, descending, and sigmoid colon
without findings of diverticulitis.
6. Large hiatal hernia.
7.  Aortic Atherosclerosis ([id number].D).

## 2022-01-15 IMAGING — DX DG CHEST 2V
2 series · 2 of 2 positions shown · non-contrast
Comparison: 02/11/2016

CLINICAL DATA: Right back and flank pain

EXAM:
CHEST - 2 VIEW

[x chest ap]
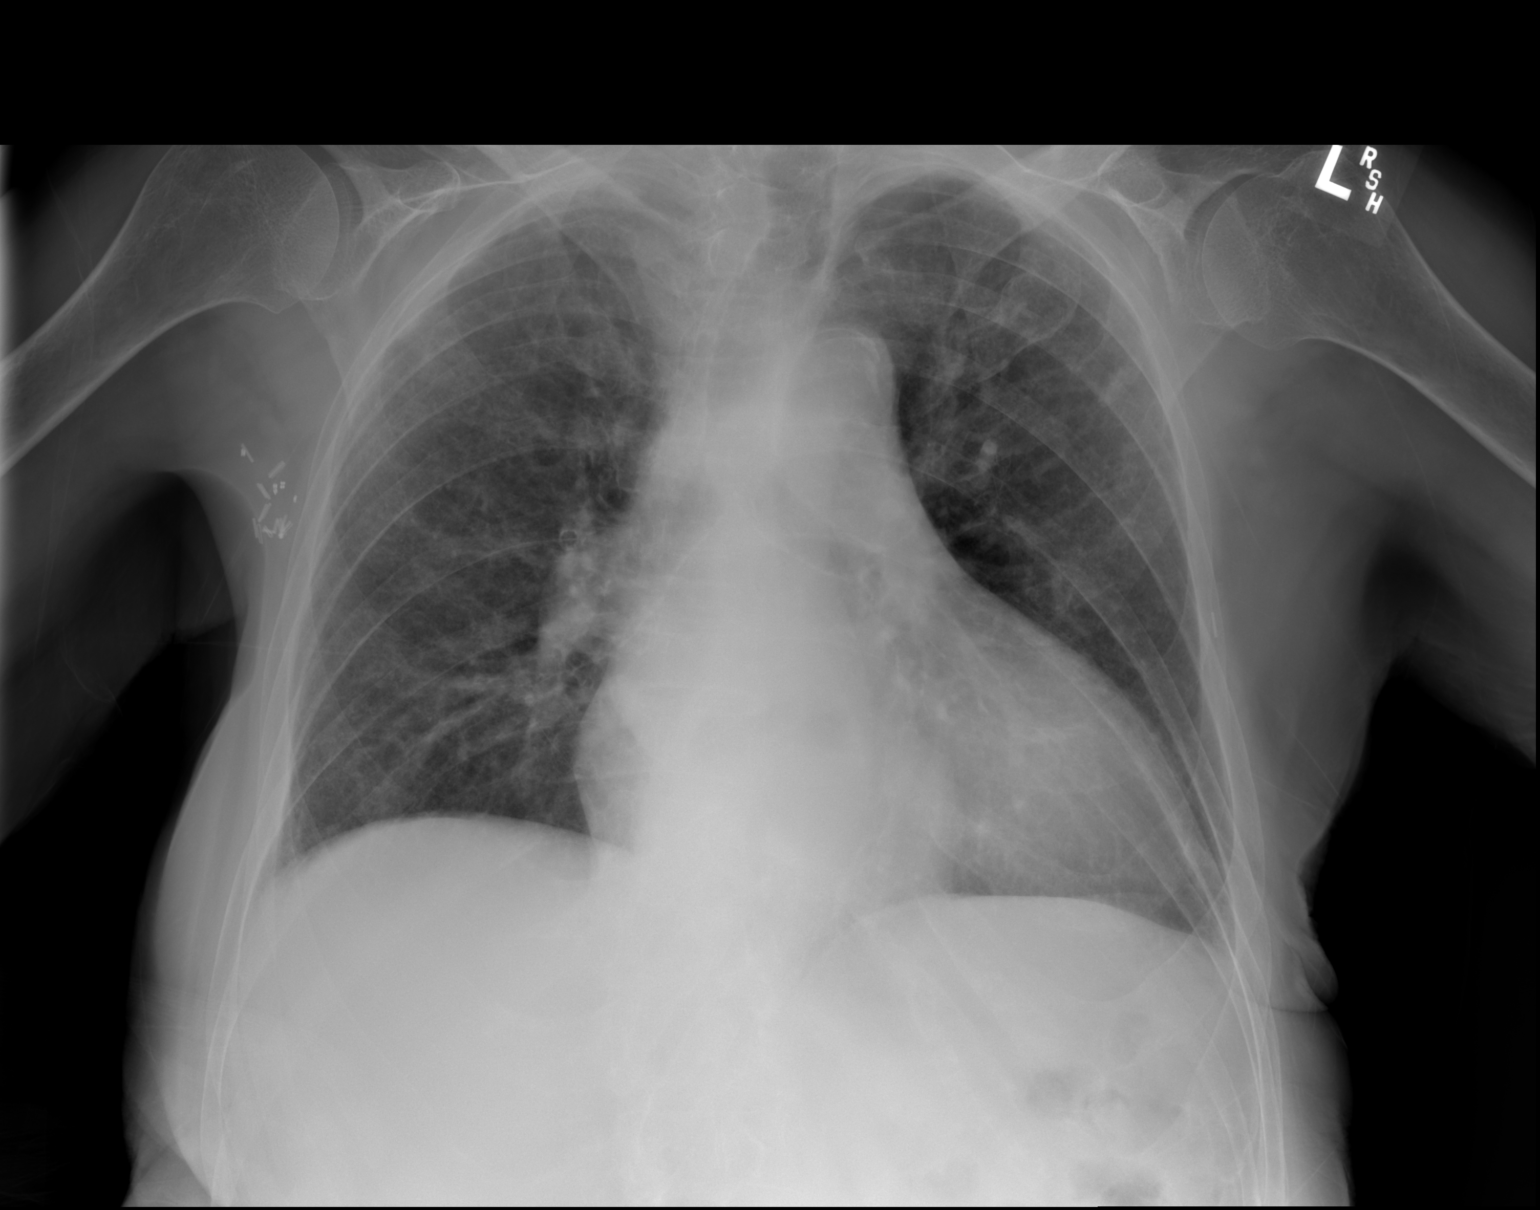

[w chest lat]
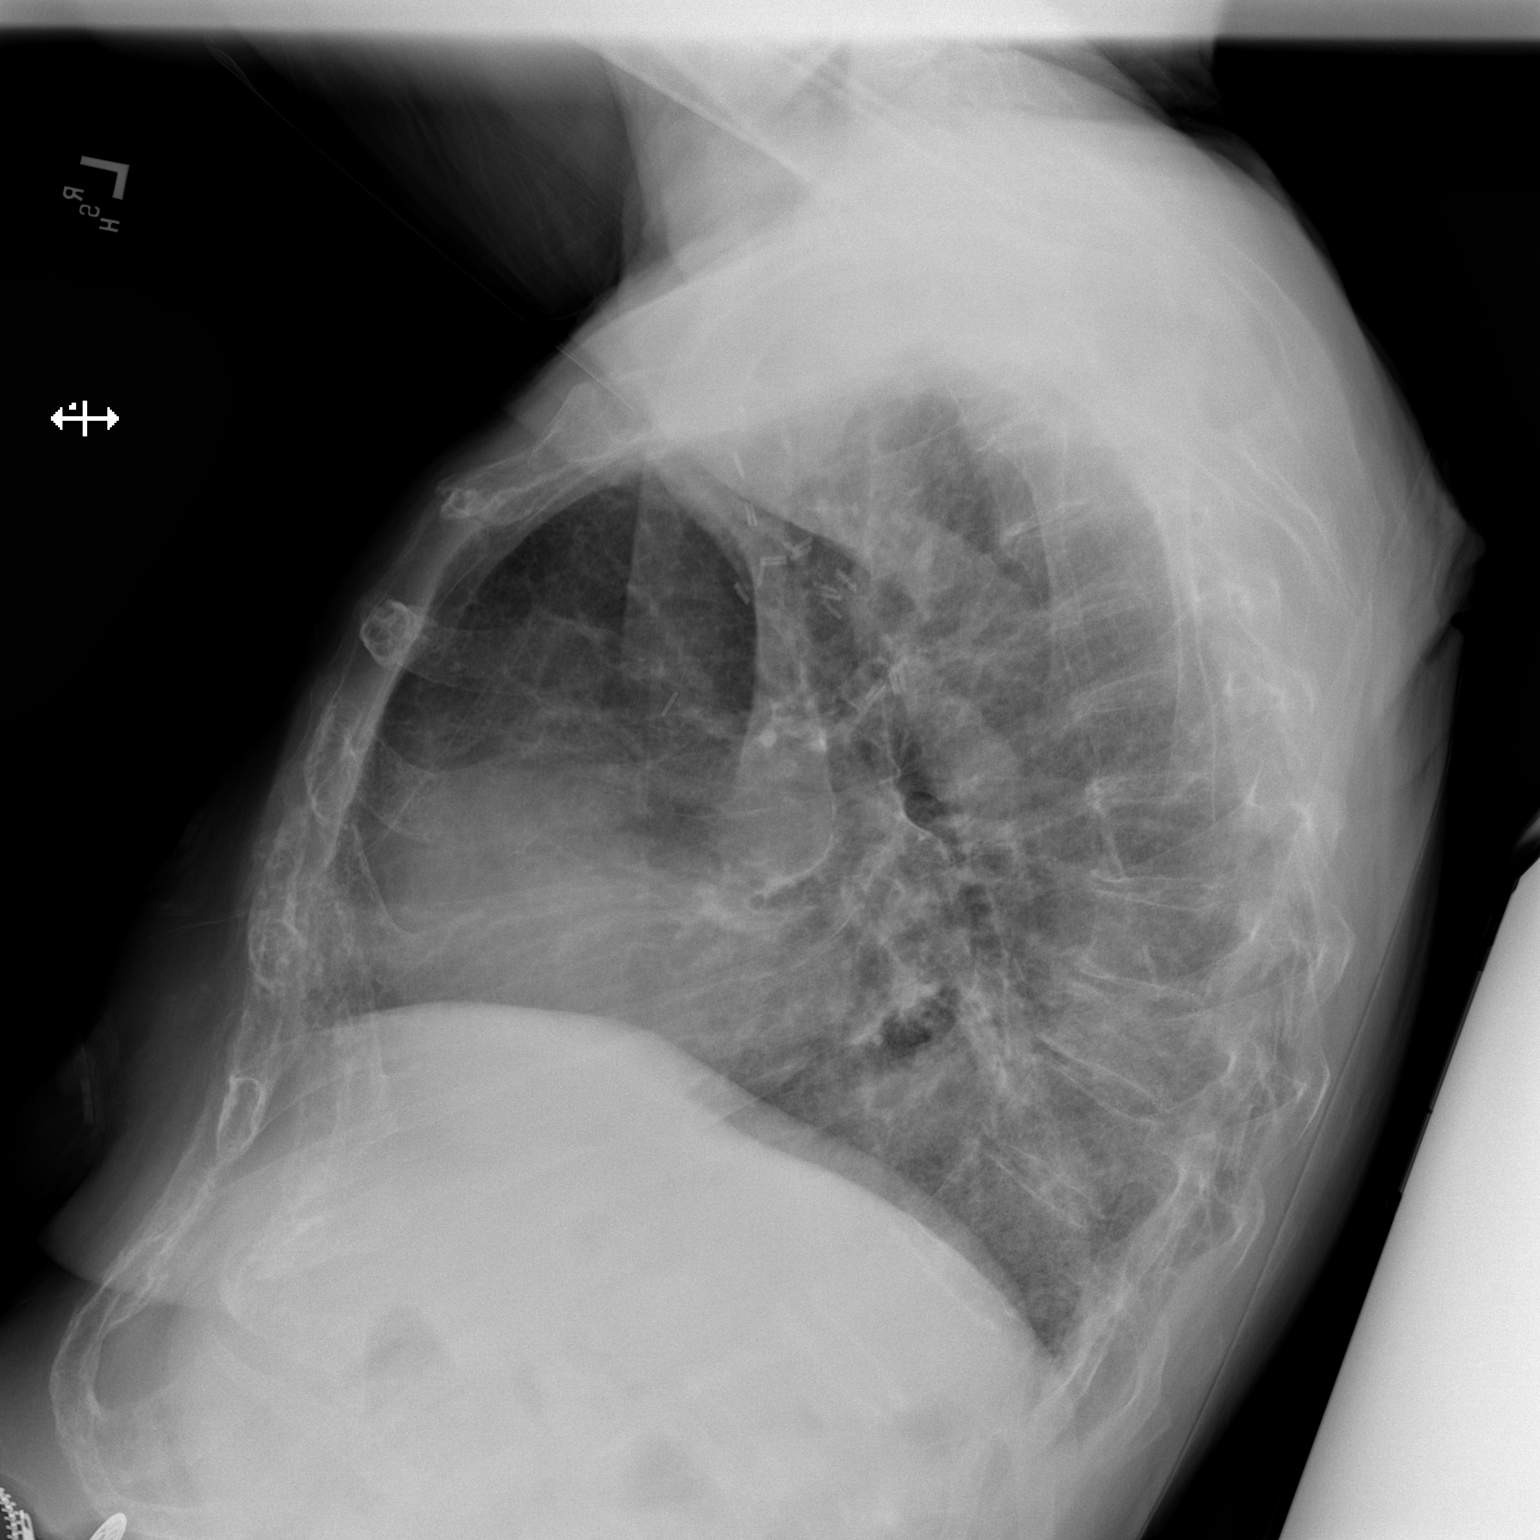

[2 of 2 positions shown; findings below may reference images not displayed]

FINDINGS: Heart size appears mildly enlarged. Atherosclerotic calcification of
the aortic knob. Moderate-large hiatal hernia. Chronically coarsened
interstitial markings. No focal airspace consolidation, pleural
effusion, or pneumothorax.
IMPRESSION: 1. No active cardiopulmonary disease.
2. Moderate-large hiatal hernia.

## 2022-02-07 ENCOUNTER — Other Ambulatory Visit: Payer: Self-pay | Admitting: Cardiovascular Disease

## 2022-02-07 DIAGNOSIS — I4819 Other persistent atrial fibrillation: Secondary | ICD-10-CM

## 2022-02-07 NOTE — Telephone Encounter (Signed)
Eliquis '5mg'$  refill request received. Patient is 81 years old, weight-65.4kg, Crea-1.39 on 09/28/2021, Diagnosis-Afib, and last seen by Fabian Sharp on 11/28/2021. Dose is appropriate based on dosing criteria. Will send in refill to requested pharmacy.

## 2022-02-27 ENCOUNTER — Other Ambulatory Visit: Payer: Self-pay | Admitting: Family

## 2022-02-27 DIAGNOSIS — R0609 Other forms of dyspnea: Secondary | ICD-10-CM

## 2022-02-27 DIAGNOSIS — I509 Heart failure, unspecified: Secondary | ICD-10-CM

## 2022-03-23 ENCOUNTER — Encounter: Payer: Self-pay | Admitting: Family

## 2022-03-27 ENCOUNTER — Telehealth: Payer: Self-pay

## 2022-03-27 ENCOUNTER — Other Ambulatory Visit: Payer: Medicare HMO

## 2022-03-27 DIAGNOSIS — I4819 Other persistent atrial fibrillation: Secondary | ICD-10-CM

## 2022-03-27 DIAGNOSIS — E039 Hypothyroidism, unspecified: Secondary | ICD-10-CM

## 2022-03-27 DIAGNOSIS — I10 Essential (primary) hypertension: Secondary | ICD-10-CM

## 2022-03-27 NOTE — Telephone Encounter (Signed)
May use COVID-19 home test.

## 2022-03-27 NOTE — Telephone Encounter (Signed)
error 

## 2022-03-27 NOTE — Telephone Encounter (Signed)
Patient called stating that she isn't feeling well. She complains of runny nose, raspy voice, cough and yellow phlegm. Patient states that she has had runny nose for about a week and other symptoms started over the weekend. Patient has not taken a COVID test. Patient wanted antibiotics prescribed. I offered patient an appointment to come in office,but she stated that she wouldn't be able to come in ,but does have an appointment Thursday and will just wait until then. She stated that she will just try to take something until then. I informed patient that she needed to be tested before prescribing any medication.  Message routed to Marlowe Sax, NP

## 2022-03-30 ENCOUNTER — Encounter: Payer: Medicare HMO | Admitting: Family

## 2022-04-14 NOTE — Progress Notes (Signed)
  This encounter was created in error - please disregard. No show 

## 2022-04-25 ENCOUNTER — Other Ambulatory Visit: Payer: Self-pay | Admitting: Family

## 2022-04-25 ENCOUNTER — Encounter: Payer: Self-pay | Admitting: Family

## 2022-04-25 ENCOUNTER — Ambulatory Visit (INDEPENDENT_AMBULATORY_CARE_PROVIDER_SITE_OTHER): Payer: Medicare HMO | Admitting: Family

## 2022-04-25 VITALS — BP 120/60 | HR 77 | Temp 97.2°F | Resp 16 | Ht 64.0 in | Wt 137.8 lb

## 2022-04-25 DIAGNOSIS — I509 Heart failure, unspecified: Secondary | ICD-10-CM

## 2022-04-25 DIAGNOSIS — N6314 Unspecified lump in the right breast, lower inner quadrant: Secondary | ICD-10-CM

## 2022-04-25 DIAGNOSIS — F3341 Major depressive disorder, recurrent, in partial remission: Secondary | ICD-10-CM

## 2022-04-25 DIAGNOSIS — I10 Essential (primary) hypertension: Secondary | ICD-10-CM | POA: Diagnosis not present

## 2022-04-25 DIAGNOSIS — I4819 Other persistent atrial fibrillation: Secondary | ICD-10-CM

## 2022-04-25 DIAGNOSIS — E039 Hypothyroidism, unspecified: Secondary | ICD-10-CM

## 2022-04-25 DIAGNOSIS — F411 Generalized anxiety disorder: Secondary | ICD-10-CM

## 2022-04-25 DIAGNOSIS — Z23 Encounter for immunization: Secondary | ICD-10-CM

## 2022-04-25 NOTE — Progress Notes (Signed)
Provider: Marlowe Sax FNP-C   Fatiha Guzy, Nelda Bucks, NP  Patient Care Team: Karlin Heilman, Nelda Bucks, NP as PCP - General (Family Medicine) Croitoru, Dani Gobble, MD as PCP - Cardiology (Cardiology) Sanda Klein, MD as Consulting Physician (Cardiology) Martinique, Amy, MD as Consulting Physician (Dermatology) Duke, Tami Lin, [place] as Physician Assistant (Cardiology)  Extended Emergency Contact Information Primary Emergency Contact: Batiz,[name] Address: [address]          [place] [address] Johnnette Litter of [place] Phone: [phone] Mobile Phone: [phone] Relation: Son Secondary Emergency Contact: [place] of [place] Mobile Phone: [phone] Relation: Granddaughter  Code Status:  Full Code  Goals of care: Advanced Directive information    04/25/2022   10:10 AM  Advanced Directives  Does Patient Have a Medical Advance Directive? Yes  Type of Advance Directive [place]  Does patient want to make changes to medical advance directive? No - Patient declined  Copy of [place] in Chart? No - copy requested     Chief Complaint  Patient presents with   Medical Management of Chronic Issues    6 month follow up.    Immunizations    Discuss the need for Influenza vaccine, and Covid Booster.     HPI:  Pt is a 81 y.o. female seen today for 6 months follow up for medical management of chronic diseases.    She complains of right breast redness for several weeks.she denies any pain,tenderness or discharge.  Has had weight loss 6 lbs since last seen 3 months ago.states eats 2 full meals and snacks on Fruits.usually skips breakfast since she sleeps late and wakes up late.   Also complains of some bilateral lower abdominal tenderness x few days.Has had some lower back pain.she denies any fever,chills,nausea,vomiting,flank pain,urgency,frequency,dysuria,difficult urination or hematuria.also denies any constipation.    Due for influenza and COVID-19 booster vaccine. Agrees to receive influenza vaccine this visit. She denies any upper respiratory infection symptoms or fever or chills.   Past Medical History:  Diagnosis Date   Anxiety    Atrial fibrillation with rapid ventricular response ([place]) 07/07/2015   Bilateral breast cancer (HCC)    Daily headache    Depression    GERD (gastroesophageal reflux disease)    Hyperlipidemia    Hypertension    Hypothyroidism    Iron deficiency anemia 2000s   Lumbago    Nausea alone    Senile osteoporosis    Squamous cell carcinoma of forehead    "I think they burned it off"   Past Surgical History:  Procedure Laterality Date   ABDOMINAL HYSTERECTOMY  1970s   BREAST BIOPSY Bilateral    BREAST LUMPECTOMY Right 2002   "w/lymph nodes removed"   CARDIOVERSION N/A 08/19/2015   Procedure: CARDIOVERSION;  Surgeon: Sanda Klein, MD;  Location: [facility];  Service: Cardiovascular;  Laterality: N/A;   CATARACT EXTRACTION W/ INTRAOCULAR LENS  IMPLANT, BILATERAL Bilateral 2000s   DILATION AND CURETTAGE OF UTERUS  ~ [place] Left 2011   "has 2 screws in it"   EXCISIONAL HEMORRHOIDECTOMY  ~ [place]  02/12/2016   Right Wrist   HEMIARTHROPLASTY HIP Left 2011   "had ball replaced"   INGUINAL HERNIA REPAIR Right 1980s?   MASTECTOMY Left 2005   ORIF WRIST FRACTURE Left 02/12/2016   Procedure: OPEN REDUCTION INTERNAL FIXATION LEFT DISTAL RADIUS;  Surgeon: Dayna Barker, MD;  Location: [facility];  Service: Plastics;  Laterality: Left;   TOTAL THYROIDECTOMY  1972   WRIST FRACTURE SURGERY Right    "has a plate and 7 screws in there"    No Known Allergies  Allergies as of 04/25/2022   No Known Allergies      Medication List        Accurate as of April 25, 2022 10:36 AM. If you have any questions, ask your nurse or doctor.          acetaminophen 325 MG tablet Commonly known as: TYLENOL Take 2 tablets (650 mg total) by  mouth every 4 (four) hours as needed for headache or mild pain.   albuterol 108 (90 Base) MCG/ACT inhaler Commonly known as: VENTOLIN HFA Inhale 1 puff into the lungs 3 (three) times daily as needed for shortness of breath.   ALPRAZolam 0.5 MG tablet Commonly known as: XANAX TAKE 1 TABLET BY MOUTH DAILY FOR ANXIETY   calcium citrate 950 (200 Ca) MG tablet Commonly known as: CALCITRATE - dosed in mg elemental calcium Take 200 mg of elemental calcium by mouth daily.   docusate sodium 100 MG capsule Commonly known as: Colace Take 1 capsule (100 mg total) by mouth daily.   Eliquis 5 MG Tabs tablet Generic drug: apixaban TAKE 1 TABLET BY MOUTH TWICE DAILY   furosemide 40 MG tablet Commonly known as: LASIX TAKE 1 TABLET BY MOUTH DAILY   gabapentin 100 MG capsule Commonly known as: NEURONTIN TAKE 1 CAPSULE BY MOUTH DAILY AT BEDTIME   ibuprofen 200 MG tablet Commonly known as: ADVIL Take 200 mg by mouth daily as needed for headache or mild pain.   Iron (Ferrous Sulfate) 325 (65 Fe) MG Tabs Take 325 mg by mouth daily.   levothyroxine 125 MCG tablet Commonly known as: SYNTHROID Take 1 tablet (125 mcg total) by mouth daily.   meclizine 12.5 MG tablet Commonly known as: ANTIVERT Take 12.5 mg by mouth as needed for dizziness (OTC for dizziness).   metoprolol tartrate 50 MG tablet Commonly known as: LOPRESSOR TAKE 1 TABLET BY MOUTH TWICE DAILY   potassium chloride SA 20 MEQ tablet Commonly known as: KLOR-CON M Take 2 tablets (40 mEq total) by mouth daily.   rosuvastatin 10 MG tablet Commonly known as: CRESTOR TAKE 1 TABLET BY MOUTH DAILY   sertraline 25 MG tablet Commonly known as: ZOLOFT TAKE 2 TABLETS BY MOUTH DAILY   Vitamin D3 50 MCG (2000 UT) Tabs Take 1 tablet by mouth daily.        Review of Systems  Constitutional:  Negative for appetite change, chills, fatigue, fever and unexpected weight change.  HENT:  Negative for congestion, dental problem, ear  discharge, ear pain, facial swelling, hearing loss, nosebleeds, postnasal drip, rhinorrhea, sinus pressure, sinus pain, sneezing, sore throat, tinnitus and trouble swallowing.   Eyes:  Positive for visual disturbance. Negative for pain, discharge, redness and itching.       Wears eye glasses   Respiratory:  Negative for cough, chest tightness, shortness of breath and wheezing.   Cardiovascular:  Negative for chest pain, palpitations and leg swelling.  Gastrointestinal:  Negative for abdominal distention, abdominal pain, blood in stool, constipation, diarrhea, nausea and vomiting.  Endocrine: Negative for cold intolerance, heat intolerance, polydipsia, polyphagia and polyuria.  Genitourinary:  Negative for difficulty urinating, dysuria, flank pain, frequency and urgency.  Musculoskeletal:  Positive for arthralgias, back pain and gait problem. Negative for joint swelling, myalgias, neck pain and neck stiffness.  Skin:  Negative for color change, pallor,  rash and wound.  Neurological:  Negative for dizziness, syncope, speech difficulty, weakness, light-headedness, numbness and headaches.       Tingling on hands and feet   Hematological:  Does not bruise/bleed easily.  Psychiatric/Behavioral:  Negative for agitation, behavioral problems, confusion, hallucinations, self-injury, sleep disturbance and suicidal ideas. The patient is not nervous/anxious.        6-7 hrs sleep tends to sleep late at night and wake up late in the morning.     Immunization History  Administered Date(s) Administered   Fluad Quad(high Dose 65+) 03/20/2019, 06/03/2020, 03/31/2021, 04/25/2022   Influenza, High Dose Seasonal PF 02/08/2017, 04/04/2018   Influenza,inj,Quad PF,6+ Mos 03/14/2013, 03/26/2014, 02/19/2015, 03/02/2016   PFIZER(Purple Top)SARS-COV-2 Vaccination 07/21/2019, 08/13/2019, 07/01/2020   Pneumococcal Conjugate-13 08/02/2013, 08/03/2014   Pneumococcal Polysaccharide-23 05/22/2009   Td 10/22/2017   Zoster  Recombinat (Shingrix) 05/20/2018, 11/06/2018   Zoster, Live 09/25/2013   Pertinent  Health Maintenance Due  Topic Date Due   INFLUENZA VACCINE  Completed   DEXA SCAN  Completed      04/04/2021    1:28 PM 05/31/2021    2:54 PM 09/08/2021    8:45 PM 09/28/2021   10:47 AM 04/25/2022   10:10 AM  Fall Risk  Falls in the past year? 0 0  1 0  Was there an injury with Fall? 0 0  1 0  Was there an injury with Fall? - Comments    fractured bone in right elbow.   Fall Risk Category Calculator 0 0  2 0  Fall Risk Category Low Low  Moderate Low  Patient Fall Risk Level Low fall risk Low fall risk Moderate fall risk Moderate fall risk Low fall risk  Patient at Risk for Falls Due to No Fall Risks No Fall Risks  History of fall(s) No Fall Risks  Fall risk Follow up Falls evaluation completed Falls evaluation completed  Falls evaluation completed;Education provided;Falls prevention discussed Falls evaluation completed   Functional Status Survey:    Vitals:   04/25/22 1009  BP: 120/60  Pulse: 77  Resp: 16  Temp: (!) 97.2 F (36.2 C)  SpO2: 96%  Weight: 137 lb 12.8 oz (62.5 kg)  Height: _0  (1.626 m)   Body mass index is 23.65 kg/m. Physical Exam Vitals reviewed.  Constitutional:      General: She is not in acute distress.    Appearance: Normal appearance. She is normal weight. She is not ill-appearing or diaphoretic.  HENT:     Head: Normocephalic.     Right Ear: Tympanic membrane, ear canal and external ear normal. There is no impacted cerumen.     Left Ear: Tympanic membrane, ear canal and external ear normal. There is no impacted cerumen.     Nose: Nose normal. No congestion or rhinorrhea.     Mouth/Throat:     Mouth: Mucous membranes are moist.     Pharynx: Oropharynx is clear. No oropharyngeal exudate or posterior oropharyngeal erythema.  Eyes:     General: No scleral icterus.       Right eye: No discharge.        Left eye: No discharge.     Extraocular Movements:  Extraocular movements intact.     Conjunctiva/sclera: Conjunctivae normal.     Pupils: Pupils are equal, round, and reactive to light.  Neck:     Vascular: No carotid bruit.  Cardiovascular:     Rate and Rhythm: Normal rate and regular rhythm.     Pulses: Normal  pulses.     Heart sounds: Normal heart sounds. No murmur heard.    No friction rub. No gallop.  Pulmonary:     Effort: Pulmonary effort is normal. No respiratory distress.     Breath sounds: Normal breath sounds. No wheezing, rhonchi or rales.  Chest:     Chest wall: No tenderness.  Breasts:    Right: Mass present. No swelling, bleeding, inverted nipple, nipple discharge, skin change or tenderness.     Left: Absent.     Comments: Right breast 1 to 2 cm palpable hard mass noted on right lower inner quadrant.Non tender to palpation and without any drainage. Abdominal:     General: Bowel sounds are normal. There is no distension.     Palpations: Abdomen is soft. There is no mass.     Tenderness: There is no abdominal tenderness. There is no right CVA tenderness, left CVA tenderness, guarding or rebound.  Musculoskeletal:        General: No swelling or tenderness. Normal range of motion.     Cervical back: Normal range of motion. No rigidity or tenderness.     Right lower leg: No edema.     Left lower leg: No edema.  Lymphadenopathy:     Cervical: No cervical adenopathy.  Skin:    General: Skin is warm and dry.     Coloration: Skin is not pale.     Findings: No bruising, erythema, lesion or rash.  Neurological:     Mental Status: She is alert. Mental status is at baseline.     Cranial Nerves: No cranial nerve deficit.     Sensory: No sensory deficit.     Motor: No weakness.     Coordination: Coordination normal.     Gait: Gait abnormal.  Psychiatric:        Mood and Affect: Mood normal.        Speech: Speech normal.        Behavior: Behavior normal.     Labs reviewed: Recent Labs    09/28/21 1118  NA 143  K  3.5  CL 101  CO2 30  GLUCOSE 95  BUN 15  CREATININE 1.39*  CALCIUM 9.2   No results for input(s): "AST", "ALT", "ALKPHOS", "BILITOT", "PROT", "ALBUMIN" in the last 8760 hours. Recent Labs    09/28/21 1118  WBC 7.0  NEUTROABS 3,766  HGB 10.3*  HCT 34.8*  MCV 78.0*  PLT 171   Lab Results  Component Value Date   TSH 3.95 09/28/2021   Lab Results  Component Value Date   HGBA1C 6.0 (H) 09/15/2019   Lab Results  Component Value Date   CHOL 109 09/28/2021   HDL 48 (L) 09/28/2021   LDLCALC 45 09/28/2021   TRIG 81 09/28/2021   CHOLHDL 2.3 09/28/2021    Significant Diagnostic Results in last 30 days:  No results found.  Assessment/Plan 1. Need for influenza vaccination Afebrile  Flut shot administered by CMA no acute reaction reported.  - Flu Vaccine QUAD High Dose(Fluad)  2. Essential hypertension, benign Blood pressure well-controlled Continue on Metroprolol titrate and furosemide - CBC with Differential/Platelet - CMP with eGFR([facility])  3. Acquired hypothyroidism Lab Results  Component Value Date   TSH 3.95 09/28/2021  -Continue on levothyroxine 125 mcg daily on empty stomach.  Advised to take at least 2 hours away from other medications. - TSH  4. Persistent atrial fibrillation (HCC) Heart rate control Continue on metoprolol titrate for heart rate control -Continue on Eliquis  for anticoagulation.  No symptoms of bleeding reported. -Follow-up with cardiology as advised  5. Chronic congestive heart failure, unspecified heart failure type (HCC) No symptoms of fluid overload -Continue on furosemide and potassium chloride  6. Recurrent major depressive disorder, in partial remission (HCC) Mood stable Continue on sertraline -Will continue to monitor for mood changes  7. Generalized anxiety disorder Stable on sertraline  8. Mass of lower inner quadrant of right breast Right breast 1 to 2 cm palpable hard mass noted on right lower inner quadrant.Non  tender to palpation and without any drainage. - MM DIAG BREAST TOMO UNI RIGHT  Family/ staff Communication: Reviewed plan of care with patient verbalized understanding  Labs/tests ordered:  - CBC with Differential/Platelet - CMP with eGFR([facility]) - TSH - MM DIAG BREAST TOMO UNI RIGHT  Next Appointment :Return in about 6 months (around 10/25/2022) for medical mangement of chronic issues.Sandrea Hughs, NP

## 2022-04-26 LAB — COMPLETE METABOLIC PANEL WITH GFR
AG Ratio: 1.3 (calc) (ref 1.0–2.5)
ALT: 24 U/L (ref 6–29)
AST: 29 U/L (ref 10–35)
Albumin: 4 g/dL (ref 3.6–5.1)
Alkaline phosphatase (APISO): 77 U/L (ref 37–153)
BUN/Creatinine Ratio: 10 (calc) (ref 6–22)
BUN: 13 mg/dL (ref 7–25)
CO2: 36 mmol/L — ABNORMAL HIGH (ref 20–32)
Calcium: 8.9 mg/dL (ref 8.6–10.4)
Chloride: 97 mmol/L — ABNORMAL LOW (ref 98–110)
Creat: 1.34 mg/dL — ABNORMAL HIGH (ref 0.60–0.95)
Globulin: 3.1 g/dL (calc) (ref 1.9–3.7)
Glucose, Bld: 102 mg/dL — ABNORMAL HIGH (ref 65–99)
Potassium: 3.3 mmol/L — ABNORMAL LOW (ref 3.5–5.3)
Sodium: 144 mmol/L (ref 135–146)
Total Bilirubin: 0.8 mg/dL (ref 0.2–1.2)
Total Protein: 7.1 g/dL (ref 6.1–8.1)
eGFR: 40 mL/min/{1.73_m2} — ABNORMAL LOW (ref >=60)

## 2022-04-26 LAB — CBC WITH DIFFERENTIAL/PLATELET
Absolute Monocytes: 806 cells/uL (ref 200–950)
Basophils Absolute: 76 cells/uL (ref 0–200)
Basophils Relative: 0.9 %
Eosinophils Absolute: 160 cells/uL (ref 15–500)
Eosinophils Relative: 1.9 %
HCT: 44.6 % (ref 35.0–45.0)
Hemoglobin: 14.8 g/dL (ref 11.7–15.5)
Lymphs Abs: 2075 cells/uL (ref 850–3900)
MCH: 30.8 pg (ref 27.0–33.0)
MCHC: 33.2 g/dL (ref 32.0–36.0)
MCV: 92.7 fL (ref 80.0–100.0)
MPV: 10.4 fL (ref 7.5–12.5)
Monocytes Relative: 9.6 %
Neutro Abs: 5284 cells/uL (ref 1500–7800)
Neutrophils Relative %: 62.9 %
Platelets: 147 10*3/uL (ref 140–400)
RBC: 4.81 10*6/uL (ref 3.80–5.10)
RDW: 12.5 % (ref 11.0–15.0)
Total Lymphocyte: 24.7 %
WBC: 8.4 10*3/uL (ref 3.8–10.8)

## 2022-04-26 LAB — TSH: TSH: 11.38 mIU/L — ABNORMAL HIGH (ref 0.40–4.50)

## 2022-05-01 ENCOUNTER — Telehealth (INDEPENDENT_AMBULATORY_CARE_PROVIDER_SITE_OTHER): Payer: Medicare HMO | Admitting: Family

## 2022-05-01 ENCOUNTER — Encounter: Payer: Self-pay | Admitting: Family

## 2022-05-01 DIAGNOSIS — E876 Hypokalemia: Secondary | ICD-10-CM

## 2022-05-01 DIAGNOSIS — N1832 Chronic kidney disease, stage 3b: Secondary | ICD-10-CM

## 2022-05-01 DIAGNOSIS — E039 Hypothyroidism, unspecified: Secondary | ICD-10-CM | POA: Diagnosis not present

## 2022-05-01 MED ORDER — LEVOTHYROXINE SODIUM 137 MCG PO TABS: 137.0000 ug | ORAL_TABLET | Freq: Every day | ORAL | 1 refills | Status: DC

## 2022-05-01 NOTE — Progress Notes (Signed)
This service is provided via telemedicine  No vital signs collected/recorded due to the encounter was a telemedicine visit.   Location of patient (ex: home, work):  Home  Patient consents to a telephone visit:  Yes  Location of the provider (ex: office, home):  [employer].  Name of any referring provider:  Keir Viernes, Nelda Bucks, NP   Names of all persons participating in the telemedicine service and their role in the encounter:  Patient, Deborah Randall, [place], [place], Webb Silversmith, NP.    Time spent on call:  8 minutes spent on the phone with Medical Assistant.       Provider: Marlowe Sax FNP-C  Jameison Haji, Nelda Bucks, NP  Patient Care Team: Deborah Randall, Nelda Bucks, NP as PCP - General (Family Medicine) Croitoru, Dani Gobble, MD as PCP - Cardiology (Cardiology) Sanda Klein, MD as Consulting Physician (Cardiology) Martinique, Amy, MD as Consulting Physician (Dermatology) Duke, Tami Lin, [place] as Physician Assistant (Cardiology)  Extended Emergency Contact Information Primary Emergency Contact: Randall,Deborah Address: [address]          [place] [address] Deborah Randall of [place] Phone: [phone] Mobile Phone: [phone] Relation: Son Secondary Emergency Contact: [place] of [place] Mobile Phone: [phone] Relation: Granddaughter  Code Status:  Full Code  Goals of care: Advanced Directive information    05/01/2022    9:32 AM  Advanced Directives  Does Patient Have a Medical Advance Directive? Yes  Type of Advance Directive [place]  Does patient want to make changes to medical advance directive? No - Patient declined  Copy of [place] in Chart? No - copy requested     Chief Complaint  Patient presents with   Follow-up    Discuss labs.    HPI:  Pt is a 81 y.o. female seen today for an acute visit to discuss lab results.Recent lab work reviewed and discussed with patient.  Hypokalemia  - K+ 3.3 currently on potassium chloride 20 meq tablet daily with Furosemide 40 meq daily.she denies any muscle weakness,cramps,spasm,fatigue or palpitation.   Hypothyroidism - TSH level 11.38  currently on levothyroxine 125 mcg tablet daily.states takes medication on an empty stomach first thing in the morning. Advised to take 2 hrs away from other medication.   Chronic Kidney Disease - BUN 13,CR 1.34 GFR 40 previous was 1.39; 1.35 she currently on Furosemide 40 mg tablet daily.states seldom takes her Ibuprofen 200 mg tablet daily PRN.Discussed with her to use Extra strength Tylenol for pain instead of ibuprofen.    Past Medical History:  Diagnosis Date   Anxiety    Atrial fibrillation with rapid ventricular response ([place]) 07/07/2015   Bilateral breast cancer (HCC)    Daily headache    Depression    GERD (gastroesophageal reflux disease)    Hyperlipidemia    Hypertension    Hypothyroidism    Iron deficiency anemia 2000s   Lumbago    Nausea alone    Senile osteoporosis    Squamous cell carcinoma of forehead    "I think they burned it off"   Past Surgical History:  Procedure Laterality Date   ABDOMINAL HYSTERECTOMY  1970s   BREAST BIOPSY Bilateral    BREAST LUMPECTOMY Right 2002   "w/lymph nodes removed"   CARDIOVERSION N/A 08/19/2015   Procedure: CARDIOVERSION;  Surgeon: Sanda Klein, MD;  Location: [facility];  Service: Cardiovascular;  Laterality: N/A;   CATARACT EXTRACTION W/ INTRAOCULAR LENS  IMPLANT, BILATERAL Bilateral 2000s  DILATION AND CURETTAGE OF UTERUS  ~ [place] Left 2011   "has 2 screws in it"   EXCISIONAL HEMORRHOIDECTOMY  ~ [place]  02/12/2016   Right Wrist   HEMIARTHROPLASTY HIP Left 2011   "had ball replaced"   [place] Right 1980s?   MASTECTOMY Left 2005   ORIF WRIST FRACTURE Left 02/12/2016   Procedure: OPEN REDUCTION INTERNAL FIXATION LEFT DISTAL RADIUS;  Surgeon: Dayna Barker, MD;  Location: [facility];  Service: Plastics;  Laterality: Left;   TOTAL THYROIDECTOMY  1972   WRIST FRACTURE SURGERY Right    "has a plate and 7 screws in there"    No Known Allergies  Outpatient Encounter Medications as of 05/01/2022  Medication Sig   acetaminophen (TYLENOL) 325 MG tablet Take 2 tablets (650 mg total) by mouth every 4 (four) hours as needed for headache or mild pain.   albuterol (VENTOLIN HFA) 108 (90 Base) MCG/ACT inhaler Inhale 1 puff into the lungs 3 (three) times daily as needed for shortness of breath.   ALPRAZolam (XANAX) 0.5 MG tablet TAKE 1 TABLET BY MOUTH DAILY FOR ANXIETY   calcium citrate (CALCITRATE - DOSED IN MG ELEMENTAL CALCIUM) 950 (200 Ca) MG tablet Take 200 mg of elemental calcium by mouth daily.   Cholecalciferol (VITAMIN D3) 2000 UNITS TABS Take 1 tablet by mouth daily.    docusate sodium (COLACE) 100 MG capsule Take 1 capsule (100 mg total) by mouth daily.   ELIQUIS 5 MG TABS tablet TAKE 1 TABLET BY MOUTH TWICE DAILY   furosemide (LASIX) 40 MG tablet TAKE 1 TABLET BY MOUTH DAILY   gabapentin (NEURONTIN) 100 MG capsule TAKE 1 CAPSULE BY MOUTH DAILY AT BEDTIME   ibuprofen (ADVIL) 200 MG tablet Take 200 mg by mouth daily as needed for headache or mild pain.   Iron, Ferrous Sulfate, 325 (65 Fe) MG TABS Take 325 mg by mouth daily.   levothyroxine (SYNTHROID) 125 MCG tablet Take 1 tablet (125 mcg total) by mouth daily.   meclizine (ANTIVERT) 12.5 MG tablet Take 12.5 mg by mouth as needed for dizziness (OTC for dizziness).   metoprolol tartrate (LOPRESSOR) 50 MG tablet TAKE 1 TABLET BY MOUTH TWICE DAILY   potassium chloride SA (KLOR-CON) 20 MEQ tablet Take 2 tablets (40 mEq total) by mouth daily.   rosuvastatin (CRESTOR) 10 MG tablet TAKE 1 TABLET BY MOUTH DAILY   sertraline (ZOLOFT) 25 MG tablet TAKE 2 TABLETS BY MOUTH DAILY   No facility-administered encounter medications on file as of 05/01/2022.    Review of Systems  Constitutional:  Negative for appetite change,  chills, fatigue, fever and unexpected weight change.  Respiratory:  Negative for cough, chest tightness, shortness of breath and wheezing.   Cardiovascular:  Negative for chest pain, palpitations and leg swelling.  Gastrointestinal:  Negative for abdominal distention, abdominal pain, constipation, diarrhea, nausea and vomiting.  Skin:  Negative for color change, pallor and rash.  Neurological:  Negative for dizziness, weakness, light-headedness, numbness and headaches.  Psychiatric/Behavioral:  Negative for agitation, behavioral problems and sleep disturbance. The patient is not nervous/anxious.     Immunization History  Administered Date(s) Administered   Fluad Quad(high Dose 65+) 03/20/2019, 06/03/2020, 03/31/2021, 04/25/2022   Influenza, High Dose Seasonal PF 02/08/2017, 04/04/2018   Influenza,inj,Quad PF,6+ Mos 03/14/2013, 03/26/2014, 02/19/2015, 03/02/2016   PFIZER(Purple Top)SARS-COV-2 Vaccination 07/21/2019, 08/13/2019, 07/01/2020   Pneumococcal Conjugate-13 08/02/2013, 08/03/2014   Pneumococcal Polysaccharide-23 05/22/2009   Td 10/22/2017  Zoster Recombinat (Shingrix) 05/20/2018, 11/06/2018   Zoster, Live 09/25/2013   Pertinent  Health Maintenance Due  Topic Date Due   INFLUENZA VACCINE  Completed   DEXA SCAN  Completed      05/31/2021    2:54 PM 09/08/2021    8:45 PM 09/28/2021   10:47 AM 04/25/2022   10:10 AM 05/01/2022    9:31 AM  Fall Risk  Falls in the past year? 0  1 0 0  Was there an injury with Fall? 0  1 0 0  Was there an injury with Fall? - Comments   fractured bone in right elbow.    Fall Risk Category Calculator 0  2 0 0  Fall Risk Category Low  Moderate Low Low  Patient Fall Risk Level Low fall risk Moderate fall risk Moderate fall risk Low fall risk Low fall risk  Patient at Risk for Falls Due to No Fall Risks  History of fall(s) No Fall Risks No Fall Risks  Fall risk Follow up Falls evaluation completed  Falls evaluation completed;Education provided;Falls  prevention discussed Falls evaluation completed Falls evaluation completed   Functional Status Survey:    There were no vitals filed for this visit. There is no height or weight on file to calculate BMI. Physical Exam Unable to complete on telephone.  Labs reviewed: Recent Labs    09/28/21 1118 04/25/22 1106  NA 143 144  K 3.5 3.3*  CL 101 97*  CO2 30 36*  GLUCOSE 95 102*  BUN 15 13  CREATININE 1.39* 1.34*  CALCIUM 9.2 8.9   Recent Labs    04/25/22 1106  AST 29  ALT 24  BILITOT 0.8  PROT 7.1   Recent Labs    09/28/21 1118 04/25/22 1106  WBC 7.0 8.4  NEUTROABS 3,766 5,284  HGB 10.3* 14.8  HCT 34.8* 44.6  MCV 78.0* 92.7  PLT 171 147   Lab Results  Component Value Date   TSH 11.38 (H) 04/25/2022   Lab Results  Component Value Date   HGBA1C 6.0 (H) 09/15/2019   Lab Results  Component Value Date   CHOL 109 09/28/2021   HDL 48 (L) 09/28/2021   LDLCALC 45 09/28/2021   TRIG 81 09/28/2021   CHOLHDL 2.3 09/28/2021    Significant Diagnostic Results in last 30 days:  No results found.  Assessment/Plan 1. Acquired hypothyroidism Lab Results  Component Value Date   TSH 11.38 (H) 04/25/2022  -Advised to increase levothyroxine from 125 to 137 mcg tablet daily on empty stomach 2 hours away from other medication. -She will schedule a follow-up appointment to recheck TSH in 2 months - levothyroxine (SYNTHROID) 137 MCG tablet; Take 1 tablet (137 mcg total) by mouth daily.  Dispense: 60 tablet; Refill: 1  2. Hypokalemia 3.3 K+ Advised to take extra 20 mg equivalent of potassium chloride x 1 dose for total of 40 mEq  3. Stage 3b chronic kidney disease (HCC) Creatinine stable CR 1.34 GFR 40 previous was 1.39; 1.35 -Continue to avoid nephrotoxins and dose all other medication for renal clearance.  Advised to take extra strength Tylenol instead of ibuprofen but states seldom takes ibuprofen would like to keep it on her medication list because it works better than  the Tylenol.  Family/ staff Communication: Reviewed plan of care with patient verbalized understanding  Labs/tests ordered: TSH in 2 months  Next Appointment: Return in about 2 months (around 07/02/2022) for Recheck TSH.  I connected with  Deborah Randall on 05/01/22  by a video enabled telemedicine application and verified that I am speaking with the correct person using two identifiers.   I discussed the limitations of evaluation and management by telemedicine. The patient expressed understanding and agreed to proceed.  Spent 11 minutes of non-face to face with patient  >50% time spent counseling; reviewing medical record; labs; and developing future plan of care.  Sandrea Hughs, NP

## 2022-05-09 ENCOUNTER — Ambulatory Visit
Admission: RE | Admit: 2022-05-09 | Discharge: 2022-05-09 | Disposition: A | Discharge status: Home / Self Care | Payer: Medicare HMO | Source: Ambulatory Visit | Attending: Family | Admitting: Family

## 2022-05-09 ENCOUNTER — Other Ambulatory Visit: Payer: Self-pay | Admitting: Family

## 2022-05-09 DIAGNOSIS — Z853 Personal history of malignant neoplasm of breast: Secondary | ICD-10-CM | POA: Diagnosis not present

## 2022-05-09 DIAGNOSIS — R928 Other abnormal and inconclusive findings on diagnostic imaging of breast: Secondary | ICD-10-CM | POA: Diagnosis not present

## 2022-05-09 DIAGNOSIS — N6311 Unspecified lump in the right breast, upper outer quadrant: Secondary | ICD-10-CM | POA: Diagnosis not present

## 2022-05-09 DIAGNOSIS — N6314 Unspecified lump in the right breast, lower inner quadrant: Secondary | ICD-10-CM

## 2022-05-09 DIAGNOSIS — N6310 Unspecified lump in the right breast, unspecified quadrant: Secondary | ICD-10-CM

## 2022-05-10 ENCOUNTER — Other Ambulatory Visit: Payer: Self-pay

## 2022-05-10 DIAGNOSIS — F419 Anxiety disorder, unspecified: Secondary | ICD-10-CM

## 2022-05-10 MED ORDER — ALPRAZOLAM 0.5 MG PO TABS: ORAL_TABLET | ORAL | 5 refills | Status: DC

## 2022-05-10 NOTE — Telephone Encounter (Signed)
Refill request received from pharmacy. Medication last refilled 09/16/2021. Patient needs to update contract.  Medication pended and sent to Marlowe Sax, NP

## 2022-05-18 ENCOUNTER — Ambulatory Visit
Admission: RE | Admit: 2022-05-18 | Discharge: 2022-05-18 | Disposition: A | Discharge status: Home / Self Care | Payer: Medicare HMO | Source: Ambulatory Visit | Attending: Family | Admitting: Family

## 2022-05-18 DIAGNOSIS — N6310 Unspecified lump in the right breast, unspecified quadrant: Secondary | ICD-10-CM

## 2022-05-18 DIAGNOSIS — C50811 Malignant neoplasm of overlapping sites of right female breast: Secondary | ICD-10-CM | POA: Diagnosis not present

## 2022-05-18 DIAGNOSIS — N6315 Unspecified lump in the right breast, overlapping quadrants: Secondary | ICD-10-CM | POA: Diagnosis not present

## 2022-05-18 HISTORY — PX: BREAST BIOPSY: SHX20

## 2022-05-19 ENCOUNTER — Other Ambulatory Visit: Payer: Self-pay | Admitting: Family

## 2022-05-26 ENCOUNTER — Other Ambulatory Visit: Payer: Self-pay | Admitting: Family

## 2022-05-26 DIAGNOSIS — F3289 Other specified depressive episodes: Secondary | ICD-10-CM

## 2022-05-26 DIAGNOSIS — M545 Low back pain, unspecified: Secondary | ICD-10-CM

## 2022-05-29 ENCOUNTER — Other Ambulatory Visit: Payer: Self-pay | Admitting: General Surgery

## 2022-05-29 DIAGNOSIS — M545 Low back pain, unspecified: Secondary | ICD-10-CM | POA: Diagnosis not present

## 2022-05-29 DIAGNOSIS — Z170 Estrogen receptor positive status [ER+]: Secondary | ICD-10-CM | POA: Diagnosis not present

## 2022-05-29 DIAGNOSIS — C50411 Malignant neoplasm of upper-outer quadrant of right female breast: Secondary | ICD-10-CM | POA: Diagnosis not present

## 2022-05-29 DIAGNOSIS — G8929 Other chronic pain: Secondary | ICD-10-CM | POA: Diagnosis not present

## 2022-05-29 DIAGNOSIS — Z853 Personal history of malignant neoplasm of breast: Secondary | ICD-10-CM | POA: Diagnosis not present

## 2022-05-29 DIAGNOSIS — I482 Chronic atrial fibrillation, unspecified: Secondary | ICD-10-CM | POA: Diagnosis not present

## 2022-05-29 DIAGNOSIS — L988 Other specified disorders of the skin and subcutaneous tissue: Secondary | ICD-10-CM | POA: Diagnosis not present

## 2022-05-29 DIAGNOSIS — Z7901 Long term (current) use of anticoagulants: Secondary | ICD-10-CM | POA: Diagnosis not present

## 2022-05-30 ENCOUNTER — Other Ambulatory Visit (HOSPITAL_COMMUNITY): Payer: Self-pay | Admitting: General Surgery

## 2022-05-30 ENCOUNTER — Telehealth: Payer: Self-pay

## 2022-05-30 DIAGNOSIS — Z170 Estrogen receptor positive status [ER+]: Secondary | ICD-10-CM

## 2022-05-30 NOTE — Telephone Encounter (Signed)
   Pre-operative Risk Assessment    Patient Name: Deborah Randall  DOB: [birth year] MRN: [id number]     Request for Surgical Clearance    Procedure:   RIGHT MASTECTOMY  Date of Surgery:  Clearance TBD                                 Surgeon:  Stark Klein, MD Surgeon's Group or Practice Name:  [facility]  Phone number:  [phone]  Fax number:  [phone]   Type of Clearance Requested:   - Pharmacy:  Hold Apixaban (Eliquis) instruction on how to hold medication preoperatively  Type of Anesthesia:  General    Additional requests/questions:  Please advise surgeon/provider what medications should be held. Please fax a copy of CARDIAC CLEARANCE to the surgeon's office.  Signed, Jeanmarie Plant Larnie Heart  CCMA 05/30/2022, 2:33 PM

## 2022-05-31 ENCOUNTER — Encounter: Payer: Self-pay | Admitting: *Deleted

## 2022-05-31 DIAGNOSIS — Z853 Personal history of malignant neoplasm of breast: Secondary | ICD-10-CM

## 2022-06-01 ENCOUNTER — Encounter: Payer: Medicare HMO | Admitting: Family

## 2022-06-02 ENCOUNTER — Other Ambulatory Visit (HOSPITAL_COMMUNITY): Payer: Self-pay | Admitting: General Surgery

## 2022-06-02 DIAGNOSIS — Z170 Estrogen receptor positive status [ER+]: Secondary | ICD-10-CM

## 2022-06-05 NOTE — Progress Notes (Signed)
Location of Breast Cancer:  Malignant neoplasm of upper-outer quadrant of right breast in female, estrogen receptor positive  (History of left and right breast cancer)  Histology per Pathology Report:  05/18/2022 Breast, right, needle core biopsy, 12:00 - INVASIVE POORLY DIFFERENTIATED DUCTAL ADENOCARCINOMA, GRADE 3 (3+2+3) - TUBULE FORMATION: SCORE 3 - NUCLEAR PLEOMORPHISM: SCORE 2 - MITOTIC COUNT: SCORE 3 - OVERALL GRADE GRADE 3 (8/9) - RARE MICROCALCIFICATIONS PRESENT FOCALLY ADJACENT TO A BENIGN DUCT AND WITHIN PERITUMORAL STROMA - NEGATIVE FOR LYMPHOVASCULAR INVASION - TUMOR MEASURES 13 MM IN GREATEST LINEAR EXTENT  Receptor Status: ER(100%), PR (100%), Her2-neu (Negative via FISH), Ki-67(10%)  Did patient present with symptoms (if so, please note symptoms) or was this found on screening mammography?: from Dr. Marlowe Aschoff office 05/29/22 note: "The patient states that she felt something there around 2 months ago as well as a skin lesion near her nipple. She thought it was just scar tissue in her lumpectomy scar"  Past/Anticipated interventions by surgeon, if any:  05/29/2022  --Dr. Stark Klein (office visit) Of note, she has had a previous mastectomy for breast cancer in 2005 and has had a right lumpectomy and ALND on the right in the past.  She also had radiation on the right.  After at least one of her cancers she took tamoxifen.  Patient has a new diagnosis of clinical T2 N0 right breast cancer. This has been quite a long time so it is likely a new cancer and not a recurrent cancer. Because of her previous radiation, the size of the tumor, and the fact that this is poorly differentiated, I do recommend a mastectomy. Also this is her third diagnosis of cancer. I am referring her to genetics, medical oncology, and radiation oncology.  Her family is especially concerned about her multiple incidences of malignancy. We have sent a request to her cardiologist regarding whether we can hold  her Eliquis or whether we have to do a bridging Lovenox. I am sending her to physical therapy preoperatively given that she already has some restricted range of motion from her previous breast and axillary surgery. She does have some back pain that is, but over the past 6 months or so as well as persistent right lower quadrant pain.  It is the size of the tumor, the fact that her axillary lymph nodes have been removed, and the persistence of the symptoms, I am ordering a CT of the chest abdomen pelvis and a bone scan.   Past/Anticipated interventions by medical oncology, if any:  Scheduled for consultation with Dr. Benay Pike on 06/13/2022  Lymphedema issues, if any:  None    Pain issues, if any:  None   SAFETY ISSUES: Prior radiation? Yes 2002 Breast Pacemaker/ICD? No Possible current pregnancy? No--hysterectomy Is the patient on methotrexate? No  Current Complaints / other details:   CT C/A/P w/ Contrast  06/06/2022 Vitals:   06/08/22 1228  BP: (!) 129/92  Pulse: 69  Resp: 18  Temp: (!) 96.6 F (35.9 C)  TempSrc: Temporal  SpO2: 96%  Weight: 62.6 kg  Height: '5\' 4"'$  (1.626 m)    Scheduled for Whole Body Bone Scan on 06/14/2022

## 2022-06-06 ENCOUNTER — Encounter (HOSPITAL_COMMUNITY): Payer: Self-pay

## 2022-06-06 ENCOUNTER — Ambulatory Visit (HOSPITAL_COMMUNITY)
Admission: RE | Admit: 2022-06-06 | Discharge: 2022-06-06 | Disposition: A | Discharge status: Home / Self Care | Payer: Medicare HMO | Source: Ambulatory Visit | Attending: General Surgery | Admitting: General Surgery

## 2022-06-06 DIAGNOSIS — C50411 Malignant neoplasm of upper-outer quadrant of right female breast: Secondary | ICD-10-CM | POA: Diagnosis not present

## 2022-06-06 DIAGNOSIS — Z170 Estrogen receptor positive status [ER+]: Secondary | ICD-10-CM | POA: Diagnosis not present

## 2022-06-06 DIAGNOSIS — I700 Atherosclerosis of aorta: Secondary | ICD-10-CM | POA: Diagnosis not present

## 2022-06-06 DIAGNOSIS — K5730 Diverticulosis of large intestine without perforation or abscess without bleeding: Secondary | ICD-10-CM | POA: Diagnosis not present

## 2022-06-06 DIAGNOSIS — K449 Diaphragmatic hernia without obstruction or gangrene: Secondary | ICD-10-CM | POA: Diagnosis not present

## 2022-06-06 DIAGNOSIS — K862 Cyst of pancreas: Secondary | ICD-10-CM | POA: Diagnosis not present

## 2022-06-06 DIAGNOSIS — J984 Other disorders of lung: Secondary | ICD-10-CM | POA: Diagnosis not present

## 2022-06-06 MED ORDER — IOHEXOL 300 MG/ML  SOLN
100.0000 mL | Freq: Once | INTRAMUSCULAR | Status: AC | PRN
  Administered 2022-06-06: 100 mL via INTRAVENOUS

## 2022-06-06 MED ORDER — SODIUM CHLORIDE (PF) 0.9 % IJ SOLN
INTRAMUSCULAR | Status: AC
  Filled 2022-06-06: qty 50

## 2022-06-06 NOTE — Evaluation (Incomplete)
OUTPATIENT PHYSICAL THERAPY ONCOLOGY EVALUATION  Patient Name: Deborah Randall MRN: [id number] DOB:September 02, 1940, 82 y.o., female Today's Date: 06/06/2022  END OF SESSION:   Past Medical History:  Diagnosis Date   Anxiety    Atrial fibrillation with rapid ventricular response ([place]) 07/07/2015   Bilateral breast cancer ([place])    Daily headache    Depression    GERD (gastroesophageal reflux disease)    Hyperlipidemia    Hypertension    Hypothyroidism    Iron deficiency anemia 2000s   Lumbago    Nausea alone    Senile osteoporosis    Squamous cell carcinoma of forehead    "I think they burned it off"   Past Surgical History:  Procedure Laterality Date   ABDOMINAL HYSTERECTOMY  1970s   BREAST BIOPSY Bilateral    BREAST BIOPSY Right 05/18/2022   [place] RT BREAST BX W LOC DEV 1ST LESION IMG BX SPEC US GUIDE 05/18/2022 [facility]   BREAST LUMPECTOMY Right 2002   "w/lymph nodes removed"   CARDIOVERSION N/A 08/19/2015   Procedure: CARDIOVERSION;  Surgeon: Sanda Klein, MD;  Location: [facility];  Service: Cardiovascular;  Laterality: N/A;   CATARACT EXTRACTION W/ INTRAOCULAR LENS  IMPLANT, BILATERAL Bilateral 2000s   DILATION AND CURETTAGE OF UTERUS  ~ [place] Left 2011   "has 2 screws in it"   EXCISIONAL HEMORRHOIDECTOMY  ~ [place]  02/12/2016   Right Wrist   HEMIARTHROPLASTY HIP Left 2011   "had ball replaced"   [place] Right 1980s?   MASTECTOMY Left 2005   ORIF WRIST FRACTURE Left 02/12/2016   Procedure: OPEN REDUCTION INTERNAL FIXATION LEFT DISTAL RADIUS;  Surgeon: Dayna Barker, MD;  Location: [facility];  Service: Plastics;  Laterality: Left;   TOTAL THYROIDECTOMY  1972   WRIST FRACTURE SURGERY Right    "has a plate and 7 screws in there"   Patient Active Problem List   Diagnosis Date Noted   Atherosclerosis of aorta ([place]) 10/02/2021   Generalized anxiety disorder 10/02/2021   Anxiety    Closed fracture of left  distal radius 02/11/2016   Persistent atrial fibrillation ([place])    Chronic anticoagulation 08/11/2015   Chronic renal insufficiency, stage III (moderate) ([place]) 08/11/2015   Chest pain with moderate risk of acute coronary syndrome 07/08/2015   Allergic rhinitis 08/03/2014   Hypercholesterolemia 09/25/2013   Hypothyroidism 09/05/2012   Depression 09/05/2012   Essential hypertension, benign 09/05/2012   Senile osteoporosis 09/05/2012   HX: breast cancer 03/19/2012    PCP: ***  REFERRING PROVIDER: Stark Klein, MD  REFERRING DIAG: Hx of Breast Cancer/limitations in Right shoulder ROM  THERAPY DIAG:  No diagnosis found.  ONSET DATE: ***  Rationale for Evaluation and Treatment: Rehabilitation  SUBJECTIVE:  SUBJECTIVE STATEMENT:  PERTINENT HISTORY:  Pt had a right lumpectomy with ALND in 2002, and a Left Mastectomy in 2005. She is pending a right Mastectomy due to a reoccurence of Breast Cancer in the right side. She is scheduled for a bone scan on 06/14/2022 and a CT abdomen and thorax.  PAIN:  Are you having pain? {yes/no:20286} NPRS scale: ***/10 Pain location: *** Pain orientation: {Pain Orientation:25161}  PAIN TYPE: {type:313116} Pain description: {PAIN DESCRIPTION:21022940}  Aggravating factors: *** Relieving factors: ***  PRECAUTIONS: lymphedema risk, Afib,hypertension, osteoporosis,hypothyroid  WEIGHT BEARING RESTRICTIONS: {Yes ***/No:24003}  FALLS:  Has patient fallen in last 6 months? {fallsyesno:27318}  LIVING ENVIRONMENT: Lives with: {[facility] lives with:25569::"lives with their family"} Lives in: {Lives in:25570} Stairs: {yes/no:20286}; {Stairs:24000} Has following equipment at home: {Assistive devices:23999}  OCCUPATION: ***  LEISURE: ***  HAND DOMINANCE:  {RIGHT/LEFT:21944}   PRIOR LEVEL OF FUNCTION: {PLOF:24004}  PATIENT GOALS: ***   OBJECTIVE:  COGNITION: Overall cognitive status: {cognition:24006}   PALPATION: ***  OBSERVATIONS / OTHER ASSESSMENTS: ***  SENSATION: Light touch: {intact/deficits:24005}   POSTURE: forward head, rounded shoulders  UPPER EXTREMITY AROM/PROM:  A/PROM RIGHT   eval   Shoulder extension   Shoulder flexion   Shoulder abduction   Shoulder internal rotation   Shoulder external rotation     (Blank rows = not tested)  A/PROM LEFT   eval  Shoulder extension   Shoulder flexion   Shoulder abduction   Shoulder internal rotation   Shoulder external rotation     (Blank rows = not tested)  CERVICAL AROM: All within normal limits:      UPPER EXTREMITY STRENGTH: ***  LYMPHEDEMA ASSESSMENTS:   SURGERY TYPE/DATE: Right lumpectomy with ALND 2002, Left Mastectomy 2005  NUMBER OF LYMPH NODES REMOVED: ***  CHEMOTHERAPY: ***  RADIATION:***  HORMONE TREATMENT: ***  INFECTIONS: ***  LYMPHEDEMA ASSESSMENTS:   LANDMARK RIGHT  eval  10 cm proximal to olecranon process   Olecranon process   10 cm proximal to ulnar styloid process   Just proximal to ulnar styloid process   Across hand at thumb web space   At base of 2nd digit   (Blank rows = not tested)  LANDMARK LEFT  eval  10 cm proximal to olecranon process   Olecranon process   10 cm proximal to ulnar styloid process   Just proximal to ulnar styloid process   Across hand at thumb web space   At base of 2nd digit   (Blank rows = not tested)  GAIT: Distance walked: *** Assistive device utilized: {Assistive devices:23999} Level of assistance: {Levels of assistance:24026} Comments: ***    QUICK DASH SURVEY: ***   TODAY'S TREATMENT:                                                                                                                                         DATE: ***  PATIENT EDUCATION:  Education  details:  *** Person educated: {Person educated:25204} Education method: {Education Method:25205} Education comprehension: {Education Comprehension:25206}  HOME EXERCISE PROGRAM: ***  ASSESSMENT:  CLINICAL IMPRESSION: Patient is an 82 y.o. female who was seen today for physical therapy evaluation and treatment for limitations in right shoulder ROM from a prior Right lumpectomy and ALND in 2002. She is pending a right mastectomy and the surgeon wanted her to work on some ROM prior to surgery..   OBJECTIVE IMPAIRMENTS: {opptimpairments:25111}.   ACTIVITY LIMITATIONS: {activitylimitations:27494}  PARTICIPATION LIMITATIONS: {participationrestrictions:25113}  PERSONAL FACTORS: {Personal factors:25162} are also affecting patient's functional outcome.   REHAB POTENTIAL: Good  CLINICAL DECISION MAKING: Stable/uncomplicated  EVALUATION COMPLEXITY: Low  GOALS: Goals reviewed with patient? Yes  SHORT TERM GOALS: Target date: ***  Pt will be independent with HEP for right shoulder ROM Baseline: Goal status: INITIAL  2.  *** Baseline:  Goal status: {GOALSTATUS:25110}  3.  *** Baseline:  Goal status: {GOALSTATUS:25110}  4.  *** Baseline:  Goal status: {GOALSTATUS:25110}  5.  *** Baseline:  Goal status: {GOALSTATUS:25110}  6.  *** Baseline:  Goal status: {GOALSTATUS:25110}  LONG TERM GOALS: Target date: ***  *** Baseline:  Goal status: {GOALSTATUS:25110}  2.  *** Baseline:  Goal status: {GOALSTATUS:25110}  3.  *** Baseline:  Goal status: {GOALSTATUS:25110}  4.  *** Baseline:  Goal status: {GOALSTATUS:25110}  5.  *** Baseline:  Goal status: {GOALSTATUS:25110}  6.  *** Baseline:  Goal status: {GOALSTATUS:25110}  PLAN:  PT FREQUENCY: {rehab frequency:25116}  PT DURATION: {rehab duration:25117}  PLANNED INTERVENTIONS: {rehab planned interventions:25118::"Therapeutic exercises","Therapeutic activity","Neuromuscular re-education","Balance training","Gait  training","Patient/Family education","Self Care","Joint mobilization"}  PLAN FOR NEXT SESSION: ***   Claris Pong, PT 06/06/2022, 3:47 PM

## 2022-06-07 ENCOUNTER — Telehealth: Payer: Self-pay

## 2022-06-07 ENCOUNTER — Other Ambulatory Visit: Payer: Self-pay

## 2022-06-07 ENCOUNTER — Ambulatory Visit: Payer: Medicare HMO | Attending: General Surgery

## 2022-06-07 DIAGNOSIS — Z853 Personal history of malignant neoplasm of breast: Secondary | ICD-10-CM | POA: Diagnosis not present

## 2022-06-07 DIAGNOSIS — M25611 Stiffness of right shoulder, not elsewhere classified: Secondary | ICD-10-CM | POA: Diagnosis not present

## 2022-06-07 NOTE — Telephone Encounter (Signed)
Patient with diagnosis of afib on Eliquis for anticoagulation.    Procedure: right mastectomy Date of procedure: TBD  CHA2DS2-VASc Score = 5  This indicates a 7.2% annual risk of stroke. The patient's score is based upon: CHF History: 0 HTN History: 1 Diabetes History: 0 Stroke History: 0 Vascular Disease History: 1 Age Score: 2 Gender Score: 1  CrCl 47m/min Platelet count 147K  Per office protocol, patient can hold Eliquis for 3 days prior to procedure.    **This guidance is not considered finalized until pre-operative APP has relayed final recommendations.**

## 2022-06-07 NOTE — Progress Notes (Signed)
Radiation Oncology         ([phone]) [phone] ________________________________  Initial Outpatient Consultation  Name: Deborah Randall MRN: [id number]  Date: 06/08/2022  DOB: 08-18-1940  WU:[id number], Nelda Bucks, NP  Stark Klein, MD   REFERRING PHYSICIAN: Stark Klein, MD  DIAGNOSIS: The primary encounter diagnosis was Malignant neoplasm of upper-outer quadrant of right breast in female, estrogen receptor positive ([place]). A diagnosis of HX: breast cancer was also pertinent to this visit.  Stage IIA (cT2, cN0, cM0, G3, ER+, PR+, HER2-) Right Breast UQ, Invasive Ductal Carcinoma, ER+ / PR+ / Her2-, Grade 3  History of right breast cancer diagnosed in 2002 s/p lumpectomy, XRT, and tamoxifen  History of left breast DCIS (high-grade) diagnosed in 2005 s/p mastectomy   HISTORY OF PRESENT ILLNESS::Deborah Randall is a 82 y.o. female who is accompanied by her supportive son. she is seen as a courtesy of Dr. Barry Dienes for an opinion concerning radiation therapy as part of management for her recently diagnosed right breast cancer. The patient has a prior history of stage I right breast invasive carcinoma diagnosed in May of 2002 s/p lumpectomy, radiation, and tamoxifen for 3 years (stopped early due to financial difficulties). She also has a history of high-grade DCIS of the left breast diagnosed in August 2005, s/p mastectomy.   The patient presented in December 2023 with 2 palpable lumps in the right breast and a skin lesion near her right nipple. This prompted a diagnostic right breast mammogram and right breast ultrasound on 05/09/22 which showed a highly suspicious mass in the 12 o'clock right breast measuring 2.1 cm. A palpable lump was also appreciated in the 4 o'clock right breast corresponding with a small lesion within the skin. [place] otherwise showed no abnormal right axillary lymph nodes.   Biopsy of the 12 o'clock right breast on 05/18/22 showed grade 3 invasive ductal carcinoma measuring 13 mm in the greatest  linear extent of the sample. Prognostic indicators significant for: estrogen receptor 100% positive and progesterone receptor 100% positive, both with strong staining intensity; Proliferation marker Ki67 at 10%; Her2 status negative; Grade 3.   Accordingly, the patient was referred to Dr. Barry Dienes on 05/29/22 for further evaluation and management. Given that this diagnosis is her third breast cancer, along with the size of the tumor, her prior radiation to the right breast, and poorly differentiated cancer histology, Dr. Barry Dienes recommended a right mastectomy. In addition to referrals to medical and radiation oncology, Dr. Barry Dienes has referred her to genetics and OP rehab.  Pertinent imaging thus far includes a CT CAP performed on 06/06/22 which confirmed a right breast lesion measuring 2.1 cm and showed no specific findings to suggest metastatic disease. Of note, it did show a 4 mm nodule in the apical segment of the right upper lobe which warrants attention on follow-up imaging. It also showed a cystic lesion within the tail of the pancrease that is unchanged compared with 01/14/21, follow-up imaging in 24 months is advised according to ACR consensus guidelines. Dr. Barry Dienes has also scheduled her for a whole body bone scan for 06/14/22 for further evaluation of new lower back pain and persistent RLQ pain.    PREVIOUS RADIATION THERAPY: Yes, for right breast cancer diagnosed/treated in 2002  PAST MEDICAL HISTORY:  Past Medical History:  Diagnosis Date   Anxiety    Atrial fibrillation with rapid ventricular response ([place]) 07/07/2015   Bilateral breast cancer ([place])    Daily headache    Depression    GERD (  gastroesophageal reflux disease)    Hyperlipidemia    Hypertension    Hypothyroidism    Iron deficiency anemia 2000s   Lumbago    Nausea alone    Senile osteoporosis    Squamous cell carcinoma of forehead    "I think they burned it off"    PAST SURGICAL HISTORY: Past Surgical History:   Procedure Laterality Date   ABDOMINAL HYSTERECTOMY  1970s   BREAST BIOPSY Bilateral    BREAST BIOPSY Right 05/18/2022   [place] RT BREAST BX W LOC DEV 1ST LESION IMG BX SPEC US GUIDE 05/18/2022 [facility]   BREAST LUMPECTOMY Right 2002   "w/lymph nodes removed"   CARDIOVERSION N/A 08/19/2015   Procedure: CARDIOVERSION;  Surgeon: Sanda Klein, MD;  Location: [facility];  Service: Cardiovascular;  Laterality: N/A;   CATARACT EXTRACTION W/ INTRAOCULAR LENS  IMPLANT, BILATERAL Bilateral 2000s   DILATION AND CURETTAGE OF UTERUS  ~ [place] Left 2011   "has 2 screws in it"   EXCISIONAL HEMORRHOIDECTOMY  ~ [place]  02/12/2016   Right Wrist   HEMIARTHROPLASTY HIP Left 2011   "had ball replaced"   [place] Right 1980s?   MASTECTOMY Left 2005   ORIF WRIST FRACTURE Left 02/12/2016   Procedure: OPEN REDUCTION INTERNAL FIXATION LEFT DISTAL RADIUS;  Surgeon: Dayna Barker, MD;  Location: [facility];  Service: Plastics;  Laterality: Left;   TOTAL THYROIDECTOMY  1972   WRIST FRACTURE SURGERY Right    "has a plate and 7 screws in there"    FAMILY HISTORY:  Family History  Problem Relation Age of Onset   Cancer Father    Lung cancer Sister    Heart disease Sister    Hypertension Sister    Ovarian cancer Sister    Heart attack Brother    Other Brother    Skin cancer Brother    Other Brother    Other Brother    Alzheimer's disease Brother    Bladder Cancer Brother    Diabetes Brother    COPD Brother    Mental illness Son    Mental illness Son     SOCIAL HISTORY:  Social History   Tobacco Use   Smoking status: Never   Smokeless tobacco: Never  Vaping Use   Vaping Use: Never used  Substance Use Topics   Alcohol use: No   Drug use: No    ALLERGIES: No Known Allergies  MEDICATIONS:  Current Outpatient Medications  Medication Sig Dispense Refill   acetaminophen (TYLENOL) 325 MG tablet Take 2 tablets (650 mg total) by mouth  every 4 (four) hours as needed for headache or mild pain.     albuterol (VENTOLIN HFA) 108 (90 Base) MCG/ACT inhaler Inhale 1 puff into the lungs 3 (three) times daily as needed for shortness of breath.     ALPRAZolam (XANAX) 0.5 MG tablet TAKE 1 TABLET BY MOUTH DAILY FOR ANXIETY 30 tablet 5   calcium citrate (CALCITRATE - DOSED IN MG ELEMENTAL CALCIUM) 950 (200 Ca) MG tablet Take 200 mg of elemental calcium by mouth daily.     Cholecalciferol (VITAMIN D3) 2000 UNITS TABS Take 1 tablet by mouth daily.      docusate sodium (COLACE) 100 MG capsule Take 1 capsule (100 mg total) by mouth daily. 10 capsule 0   ELIQUIS 5 MG TABS tablet TAKE 1 TABLET BY MOUTH TWICE DAILY 180 tablet 1   furosemide (LASIX) 40 MG tablet TAKE 1  TABLET BY MOUTH DAILY 90 tablet 1   gabapentin (NEURONTIN) 100 MG capsule TAKE 1 CAPSULE BY MOUTH DAILY AT BEDTIME 90 capsule 1   ibuprofen (ADVIL) 200 MG tablet Take 200 mg by mouth daily as needed for headache or mild pain.     Iron, Ferrous Sulfate, 325 (65 Fe) MG TABS Take 325 mg by mouth daily. 30 tablet    levothyroxine (SYNTHROID) 137 MCG tablet Take 1 tablet (137 mcg total) by mouth daily. 60 tablet 1   meclizine (ANTIVERT) 12.5 MG tablet Take 12.5 mg by mouth as needed for dizziness (OTC for dizziness).     metoprolol tartrate (LOPRESSOR) 50 MG tablet TAKE 1 TABLET BY MOUTH TWICE DAILY 180 tablet 3   potassium chloride SA (KLOR-CON) 20 MEQ tablet Take 2 tablets (40 mEq total) by mouth daily. 180 tablet 3   rosuvastatin (CRESTOR) 10 MG tablet TAKE 1 TABLET BY MOUTH DAILY 90 tablet 2   sertraline (ZOLOFT) 25 MG tablet TAKE 2 TABLETS BY MOUTH DAILY 180 tablet 1   No current facility-administered medications for this encounter.    REVIEW OF SYSTEMS:  On review of systems, the patient reports that she is doing well overall. No breast specific complaints are verbalized.   PHYSICAL EXAM:  height is '5\' 4"'$  (1.626 m) and weight is 138 lb (62.6 kg). Her temporal temperature is 96.6  F (35.9 C) (abnormal). Her blood pressure is 129/92 (abnormal) and her pulse is 69. Her respiration is 18 and oxygen saturation is 96%.   General: Alert and oriented, in no acute distress HEENT: Head is normocephalic. Extraocular movements are intact.  Neck: Neck is supple, right anterior cervical lymph node palpated, approximately 1 and half centimeters in size, level II Heart: Regular in rate and rhythm with no murmurs, rubs, or gallops. Chest: Clear to auscultation bilaterally, with no rhonchi, wheezes, or rales. Abdomen: Soft, nontender, nondistended, with no rigidity or guarding. Extremities: No cyanosis or edema. Lymphatics: see Neck Exam Skin: No concerning lesions. Musculoskeletal: symmetric strength and muscle tone throughout. Neurologic: Cranial nerves II through XII are grossly intact. No obvious focalities. Speech is fluent. Coordination is intact. Psychiatric: Judgment and insight are intact. Affect is appropriate.  Left Chest: Mastectomy scar. No masses palpated along the chest wall or in the axilla. Right Breast: Well healed lumpectomy scar. Mass palpated in the upper breast 2 cm in diameter. No masses palpated in the axilla.   ECOG = 1  0 - Asymptomatic (Fully active, able to carry on all predisease activities without restriction)  1 - Symptomatic but completely ambulatory (Restricted in physically strenuous activity but ambulatory and able to carry out work of a light or sedentary nature. For example, light housework, office work)  2 - Symptomatic, <50% in bed during the day (Ambulatory and capable of all self care but unable to carry out any work activities. Up and about more than 50% of waking hours)  3 - Symptomatic, >50% in bed, but not bedbound (Capable of only limited self-care, confined to bed or chair 50% or more of waking hours)  4 - Bedbound (Completely disabled. Cannot carry on any self-care. Totally confined to bed or chair)  5 - Death   Eustace Pen MM, Creech  RH, Tormey DC, et al. [phone]). "Toxicity and response criteria of the [facility]". [place] Oncol. 5 (6): 649-55  LABORATORY DATA:  Lab Results  Component Value Date   WBC 8.4 04/25/2022   HGB 14.8 04/25/2022   HCT  44.6 04/25/2022   MCV 92.7 04/25/2022   PLT 147 04/25/2022   NEUTROABS 5,284 04/25/2022   Lab Results  Component Value Date   NA 144 04/25/2022   K 3.3 (L) 04/25/2022   CL 97 (L) 04/25/2022   CO2 36 (H) 04/25/2022   GLUCOSE 102 (H) 04/25/2022   BUN 13 04/25/2022   CREATININE 1.34 (H) 04/25/2022   CALCIUM 8.9 04/25/2022      RADIOGRAPHY: CT CHEST ABDOMEN PELVIS W CONTRAST  Result Date: 06/08/2022 CLINICAL DATA:  Malignant neoplasm of upper-outer quadrant of right breast. Staging. * Tracking Code: BO * EXAM: CT CHEST, ABDOMEN, AND PELVIS WITH CONTRAST TECHNIQUE: Multidetector CT imaging of the chest, abdomen and pelvis was performed following the standard protocol during bolus administration of intravenous contrast. RADIATION DOSE REDUCTION: This exam was performed according to the departmental dose-optimization program which includes automated exposure control, adjustment of the mA and/or kV according to patient size and/or use of iterative reconstruction technique. CONTRAST:  189m OMNIPAQUE IOHEXOL 300 MG/ML  SOLN COMPARISON:  CT AP from 01/14/2021 FINDINGS: CT CHEST FINDINGS Cardiovascular: Mild cardiac enlargement. No pericardial effusion. Aortic atherosclerosis. Aortic atherosclerosis and coronary artery calcifications. Mediastinum/Nodes: Thyroid gland may be atrophic or surgically absent. Trachea appears patent and midline. Unremarkable appearance of the esophagus. Right axillary node dissection. Right breast lesion is identified measuring 2.1 cm, image 33/2. Status post left mastectomy. No enlarged axillary, mediastinal, or hilar lymph nodes. Lungs/Pleura: Bilateral peripheral predominant subpleural reticulation with possible mild honeycombing  overlying the lower lung zones. No pleural fluid or airspace disease. Pleuroparenchymal scarring noted in the right apex. There is a tiny nodule in the apical segment of the right upper lobe measuring 4 mm, image 31/4. Musculoskeletal: No suspicious bone lesions. Anterior compression deformity involving the T9 vertebral body is again noted in appears unchanged from thoracic spine CT dated 09/08/2021. CT ABDOMEN PELVIS FINDINGS Hepatobiliary: No focal liver abnormality is seen. No gallstones, gallbladder wall thickening, Pancreas: Cystic lesion within the tail of pancreas is identified measuring 1.7 x 0.9 cm, coronal image 63/5 and axial image 52/2. This is unchanged compared with 01/14/2021. No pancreatic inflammation or main duct dilatation identified. Spleen: Normal in size without focal abnormality. Adrenals/Urinary Tract: Normal adrenal glands. No nephrolithiasis, hydronephrosis or suspicious mass. Bladder is partially obscured by streak artifact from left hip arthroplasty device. No focal abnormality noted. Stomach/Bowel: Moderate to large hiatal hernia. No pathologic dilatation of the large or small bowel loops. No bowel wall thickening or inflammation. Sigmoid diverticulosis without signs of acute diverticulitis. Vascular/Lymphatic: Aortic atherosclerosis. No abdominal adenopathy. No pelvic or inguinal adenopathy identified. Reproductive: Status post hysterectomy. No adnexal masses. Other: No ascites.  No signs of peritoneal nodularity. Musculoskeletal: Status post left hip arthroplasty. Osteopenia. No acute or suspicious bone lesions. Stable mild endplate compression deformity involving the L3 vertebral body. IMPRESSION: 1. No specific findings identified to suggest metastatic disease to the chest, abdomen or pelvis. 2. Right breast lesion is identified measuring 2.1 cm. 3. 4 mm nodule in the apical segment of the right upper lobe. This is nonspecific, but warrants attention on follow-up imaging. 4. Cystic  lesion within the tail of pancreas is unchanged compared with 01/14/2021. This is favored to represent a benign abnormality such as a pseudocyst or side branch IPMN. According to consensus criteria follow-up imaging in 24 months with pancreas protocol MRCP or CT is advised. This recommendation follows ACR consensus guidelines: Managing Incidental Findings on Abdominal CT: White Paper of the ACR Incidental Findings Committee. J  Am Coll Radiol 2010;7:754-773 5. Moderate to large hiatal hernia. 6.  Aortic Atherosclerosis (ICD10-I70.0). Electronically Signed   By: Kerby Moors M.D.   On: 06/08/2022 06:26   [place] RT BREAST BX W LOC DEV 1ST LESION IMG BX SPEC US GUIDE  Addendum Date: 05/28/2022   ADDENDUM REPORT: 05/28/2022 16:35 ADDENDUM: Pathology revealed GRADE 3 INVASIVE POORLY DIFFERENTIATED DUCTAL ADENOCARCINOMA, RARE MICROCALCIFICATIONS PRESENT FOCALLY ADJACENT TO A BENIGN DUCT AND WITHIN PERITUMORAL STROMA of the RIGHT breast, 12 o'clock (ribbon clip). This was found to be concordant by Dr. Dorise Bullion. Pathology results were discussed with the patient by telephone. The patient reported doing well after the biopsy with tenderness at the site. Post biopsy instructions and care were reviewed and questions were answered. The patient was encouraged to call [facility] for any additional concerns. My direct phone number was provided. Surgical consultation has been arranged with Dr. Stark Klein at [facility] on May 29, 2022. Pathology results reported by Stacie Acres RN on 05/23/2022. Electronically Signed   By: Dorise Bullion III M.D.   On: 05/28/2022 16:35   Result Date: 05/28/2022 CLINICAL DATA:  Ultrasound-guided biopsy of a 12 o'clock right breast mass EXAM: ULTRASOUND GUIDED RIGHT BREAST CORE NEEDLE BIOPSY COMPARISON:  Previous exam(s). PROCEDURE: I met with the patient and we discussed the procedure of ultrasound-guided biopsy, including benefits and alternatives.  We discussed the high likelihood of a successful procedure. We discussed the risks of the procedure, including infection, bleeding, tissue injury, clip migration, and inadequate sampling. Informed written consent was given. The usual time-out protocol was performed immediately prior to the procedure. Lesion quadrant: 12 o'clock right breast Using sterile technique and 1% Lidocaine as local anesthetic, under direct ultrasound visualization, a 12 gauge spring-loaded device was used to perform biopsy of a 12 o'clock right breast mass using a lateral approach. At the conclusion of the procedure a ribbon shaped tissue marker clip was deployed into the biopsy cavity. Follow up 2 view mammogram was performed and dictated separately. IMPRESSION: Ultrasound guided biopsy of a 12 o'clock right breast mass. No apparent complications. Electronically Signed: By: Dorise Bullion III M.D. On: 05/18/2022 11:34  MM CLIP PLACEMENT RIGHT  Result Date: 05/18/2022 CLINICAL DATA:  Evaluate biopsy marker EXAM: 3D DIAGNOSTIC RIGHT MAMMOGRAM POST ULTRASOUND BIOPSY COMPARISON:  Previous exam(s). FINDINGS: 3D Mammographic images were obtained following ultrasound guided biopsy of a right breast mass. The biopsy marking clip is in expected position at the site of biopsy. IMPRESSION: Appropriate positioning of the ribbon shaped biopsy marking clip at the site of biopsy in the biopsied right breast mass. Final Assessment: Post Procedure Mammograms for Marker Placement Electronically Signed   By: Dorise Bullion III M.D.   On: 05/18/2022 12:06     IMPRESSION: Stage IIA (cT2, cN0, cM0, G3, ER+, PR+, HER2-) Right Breast UQ, Invasive Ductal Carcinoma, ER+ / PR+ / Her2-, Grade 3  Agree with plans for mastectomy treatment in light of the patient's prior history of right breast radiation treatment.  The patient wishes to proceed with mastectomy with Dr. Barry Dienes. If margins are clear and all nodes are negative, radiation therapy is not  indicated. We discussed with the patient that if pathology shows a positive node or unclear margins, she could potentially be a candidate for radiotherapy following surgery.   Mobile, nontender level II  right cervical node palpated on physical exam. Patient noticed the mass about a year ago. She states it may have grown in  size. She denied any chronic voice hoarseness or sore throat. Although unlikely, would like to rule out metastatic disease with CT of the neck.  PLAN:  We will await the pathology results from her mastectomy to determine if she is a candidate for radiotherapy.   CT of the neck ordered to evaluate palpable right upper neck node.   60 minutes of total time was spent for this patient encounter, including preparation, face-to-face counseling with the patient and coordination of care, physical exam, and documentation of the encounter.   ------------------------------------------------   Leona Singleton, PA   Blair Promise, PhD, MD  This document serves as a record of services personally performed by Gery Pray, MD. It was created on his behalf by Roney Mans, a trained medical scribe. The creation of this record is based on the scribe's personal observations and the provider's statements to them. This document has been checked and approved by the attending provider.

## 2022-06-07 NOTE — Telephone Encounter (Signed)
   Pre-operative Risk Assessment    Patient Name: CANDIDA VETTER  DOB: 10-Nov-1940 MRN: [id number]      Request for Surgical Clearance    Procedure:   Right Mastectomy  Date of Surgery:  Clearance TBD                                 Surgeon:  Stark Klein, MD Surgeon's Group or Practice Name:   [facility] Phone number:  [phone] Fax number:  [phone]   Type of Clearance Requested:   - Pharmacy:  Hold Apixaban (Eliquis)     Type of Anesthesia:  General    Additional requests/questions:   Please advise how to HOLD Eliquis prior to surgery.  Signed, Elsie Lincoln Junaid Wurzer   06/07/2022, 1:09 PM

## 2022-06-07 NOTE — Therapy (Signed)
OUTPATIENT PHYSICAL THERAPY ONCOLOGY EVALUATION  Patient Name: Deborah Randall MRN: [id number] DOB:April 15, 1941, 82 y.o., female Today's Date: 06/07/2022  END OF SESSION:  PT End of Session - 06/07/22 1713     Visit Number 1    Number of Visits 2    Date for PT Re-Evaluation 07/19/22    PT Start Time 1503    PT Stop Time 9417    PT Time Calculation (min) 50 min    Activity Tolerance Patient tolerated treatment well    Behavior During Therapy [facility] for tasks assessed/performed             Past Medical History:  Diagnosis Date   Anxiety    Atrial fibrillation with rapid ventricular response ([place]) 07/07/2015   Bilateral breast cancer ([place])    Daily headache    Depression    GERD (gastroesophageal reflux disease)    Hyperlipidemia    Hypertension    Hypothyroidism    Iron deficiency anemia 2000s   Lumbago    Nausea alone    Senile osteoporosis    Squamous cell carcinoma of forehead    "I think they burned it off"   Past Surgical History:  Procedure Laterality Date   ABDOMINAL HYSTERECTOMY  1970s   BREAST BIOPSY Bilateral    BREAST BIOPSY Right 05/18/2022   [place] RT BREAST BX W LOC DEV 1ST LESION IMG BX SPEC US GUIDE 05/18/2022 [facility] MAMMOGRAPHY   BREAST LUMPECTOMY Right 2002   "w/lymph nodes removed"   CARDIOVERSION N/A 08/19/2015   Procedure: CARDIOVERSION;  Surgeon: Sanda Klein, MD;  Location: [place];  Service: Cardiovascular;  Laterality: N/A;   CATARACT EXTRACTION W/ INTRAOCULAR LENS  IMPLANT, BILATERAL Bilateral 2000s   DILATION AND CURETTAGE OF UTERUS  ~ [place] Left 2011   "has 2 screws in it"   EXCISIONAL HEMORRHOIDECTOMY  ~ [place]  02/12/2016   Right Wrist   HEMIARTHROPLASTY HIP Left 2011   "had ball replaced"   [place] Right 1980s?   MASTECTOMY Left 2005   ORIF WRIST FRACTURE Left 02/12/2016   Procedure: OPEN REDUCTION INTERNAL FIXATION LEFT DISTAL RADIUS;  Surgeon: Dayna Barker, MD;  Location: [place];  Service: Plastics;  Laterality: Left;   TOTAL THYROIDECTOMY  1972   WRIST FRACTURE SURGERY Right    "has a plate and 7 screws in there"   Patient Active Problem List   Diagnosis Date Noted   Atherosclerosis of aorta ([place]) 10/02/2021   Generalized anxiety disorder 10/02/2021   Anxiety    Closed fracture of left distal radius 02/11/2016   Persistent atrial fibrillation (HCC)    Chronic anticoagulation 08/11/2015   Chronic renal insufficiency, stage III (moderate) ([place]) 08/11/2015   Chest pain with moderate risk of acute coronary syndrome 07/08/2015   Allergic rhinitis 08/03/2014   Hypercholesterolemia 09/25/2013   Hypothyroidism 09/05/2012   Depression 09/05/2012   Essential hypertension, benign 09/05/2012   Senile osteoporosis 09/05/2012   HX: breast cancer 03/19/2012    PCP: Marlowe Sax NP  REFERRING PROVIDER: Stark Klein, MD  REFERRING DIAG: History of Breast Cancer  THERAPY DIAG:  Personal history of breast cancer  Stiffness of right shoulder, not elsewhere classified  ONSET DATE: 05/19/2023  Rationale for Evaluation and Treatment: Rehabilitation  SUBJECTIVE:  SUBJECTIVE STATEMENT: Pt was sent by her surgeon for ROM prior to her next surgery  PERTINENT HISTORY:   Pt had a right lumpectomy  in 2002, but is not sure if she had LN's removed She did have radiation. She is pending a right mastectomy for new onset of cancer which is a grade 3 poorly differentiated Ductal Carcinoma. It was 13 mm long and was ER+, PR+, Her 2 -. She is awaiting a surgery date. She will have a bone scan on 06/14/2022.  PAIN:  Are you having pain? No  PRECAUTIONS: bilateral lymphedema risk,plate in right wrist,osteoporosis  WEIGHT BEARING RESTRICTIONS: [place] pt uses a SPC  FALLS:  Has patient  fallen in last 6 months? Yes. Number of falls 1  LIVING ENVIRONMENT: Lives with: lives alone Lives in: House/apartment Stairs: Yes; Internal: 3 steps; none Has following equipment at home: Single point cane and Walker - 4 wheeled  OCCUPATION: NA  LEISURE: coloring, puzzles, read  HAND DOMINANCE: left   PRIOR LEVEL OF FUNCTION: Independent  PATIENT GOALS: Improve shoulder ROM prior to surgery   OBJECTIVE:  COGNITION: Overall cognitive status: Within functional limits for tasks assessed   PALPATION: NA  OBSERVATIONS / OTHER ASSESSMENTS: Pt had nearly full AAROM bilaterally for flexion, ER in supine  SENSATION: Light touch: Appears intact   POSTURE: forward head rounded shoulders, increased thoracic kyphosis  UPPER EXTREMITY AROM/PROM:  A/PROM RIGHT   eval   Shoulder extension 63  Shoulder flexion 118  Shoulder abduction 115  Shoulder internal rotation   Shoulder external rotation     (Blank rows = not tested)  A/PROM LEFT   eval  Shoulder extension 60  Shoulder flexion 133  Shoulder abduction 122  Shoulder internal rotation   Shoulder external rotation     (Blank rows = not tested)  CERVICAL AROM: All within normal limits:   UPPER EXTREMITY STRENGTH: WFL   LYMPHEDEMA ASSESSMENTS:   SURGERY TYPE/DATE: 2002, Right Lumpectomy with SLNB, 2005 Left Mastectomy(not sure if LN removed)  NUMBER OF LYMPH NODES REMOVED: unsure  CHEMOTHERAPY: NO  RADIATION:on right  HORMONE TREATMENT: Yes after right lumpectomy  INFECTIONS: no  LYMPHEDEMA ASSESSMENTS:   LANDMARK RIGHT  eval  10 cm proximal to olecranon process 25.6  Olecranon process 23.4  10 cm proximal to ulnar styloid process 20  Just proximal to ulnar styloid process 16.3  Across hand at thumb web space 18  At base of 2nd digit 503  (Blank rows = not tested)  LANDMARK LEFT  eval  10 cm proximal to olecranon process 26  Olecranon process 23.4  10 cm proximal to ulnar styloid process 20.0   Just proximal to ulnar styloid process 16.7  Across hand at thumb web space 18.7  At base of 2nd digit 5.7  (Blank rows = not tested)  .    QUICK DASH SURVEY: 41% partly due to back pain   TODAY'S TREATMENT:  DATE: 06/07/2022  PATIENT EDUCATION:  Education details: Educated in 4 post of exercises and pt to do prior to her surgery and after drain is removed or when MD allows, discussed pt calling with surgery date  when she has it. Person educated: Patient Education method: Explanation, Demonstration, and Handouts Education comprehension: verbalized understanding and returned demonstration  HOME EXERCISE PROGRAM: 4 post op exercises  ASSESSMENT:  CLINICAL IMPRESSION: Patient is an 82 y.o. female who was seen today for physical therapy evaluation and treatment for limitations in Right shoulder ROM prior to undergoing another right breast surgery.  She is lacking 15 degrees of right shoulder flexion and 7 degrees of abduction compared to the left when taken in standing, but in supine had nearly full ROM. She was instructed in 4 post op exercises to begin working on and I will follow up with her after her surgery. She was advised to contact me when she gets her surgery date.  OBJECTIVE IMPAIRMENTS: decreased knowledge of condition, decreased ROM, and postural dysfunction.   ACTIVITY LIMITATIONS: standing and reach over head  PARTICIPATION LIMITATIONS:  Pt performs most activities required of her but may need assistance of family member for some higher level activities  PERSONAL FACTORS: Age and 3+ comorbidities: 2 prior breast surgeries, right breast radiation, osteoporosis  are also affecting patient's functional outcome.   REHAB POTENTIAL: Good  CLINICAL DECISION MAKING: Stable/uncomplicated  EVALUATION COMPLEXITY: Low  GOALS: Goals reviewed  with patient? Yes STG=LONG TERM GOALS: Target date: 07/19/2022  Pts Right shoulder ROM will be WNL as compared to left Baseline:  Goal status: INITIAL  2.  Pt will be educated in lymphedema precautions/skin care Baseline:  Goal status: INITIAL    PLAN:  PT FREQUENCY: 1x/week  PT DURATION: 6 weeks  PLANNED INTERVENTIONS: Therapeutic exercises, Therapeutic activity, Patient/Family education, Self Care, Joint mobilization, Orthotic/Fit training, scar mobilization, Manual therapy, and Re-evaluation  PLAN FOR NEXT SESSION: Reassess ROM, discuss lymphedema and precautions, skin care, ABC class (NO SOZO) determine if pt requires further visits   Claris Pong, PT 06/07/2022, 5:27 PM

## 2022-06-07 NOTE — Telephone Encounter (Signed)
Pharmacy please advise on holding Eliquis prior to right mastectomy scheduled for TBD. Thank you.

## 2022-06-07 NOTE — Telephone Encounter (Signed)
   Patient Name: Deborah Randall  DOB: 1941/02/01 MRN: [id number]  Primary Cardiologist: Sanda Klein, MD  Clinical pharmacists have reviewed the patient's past medical history, labs, and current medications as part of preoperative protocol coverage. The following recommendations have been made:  Patient with diagnosis of afib on Eliquis for anticoagulation.     Procedure: right mastectomy Date of procedure: TBD   CHA2DS2-VASc Score = 5  This indicates a 7.2% annual risk of stroke. The patient's score is based upon: CHF History: 0 HTN History: 1 Diabetes History: 0 Stroke History: 0 Vascular Disease History: 1 Age Score: 2 Gender Score: 1   CrCl 79m/min Platelet count 147K   Per office protocol, patient can hold Eliquis for 3 days prior to procedure.     I will route this recommendation to the requesting party via Epic fax function and remove from pre-op pool.  Please call with questions.  DMable Fill EMarissa Nestle NP 06/07/2022, 3:52 PM

## 2022-06-08 ENCOUNTER — Encounter: Payer: Self-pay | Admitting: Radiation Oncology

## 2022-06-08 ENCOUNTER — Ambulatory Visit
Admission: RE | Admit: 2022-06-08 | Discharge: 2022-06-08 | Disposition: A | Discharge status: Home / Self Care | Payer: Medicare HMO | Source: Ambulatory Visit | Attending: Radiation Oncology | Admitting: Radiation Oncology

## 2022-06-08 VITALS — BP 129/92 | HR 69 | Temp 96.6°F | Resp 18 | Ht 64.0 in | Wt 138.0 lb

## 2022-06-08 DIAGNOSIS — Z79899 Other long term (current) drug therapy: Secondary | ICD-10-CM | POA: Diagnosis not present

## 2022-06-08 DIAGNOSIS — C50411 Malignant neoplasm of upper-outer quadrant of right female breast: Secondary | ICD-10-CM | POA: Diagnosis not present

## 2022-06-08 DIAGNOSIS — Z7901 Long term (current) use of anticoagulants: Secondary | ICD-10-CM | POA: Insufficient documentation

## 2022-06-08 DIAGNOSIS — I700 Atherosclerosis of aorta: Secondary | ICD-10-CM | POA: Insufficient documentation

## 2022-06-08 DIAGNOSIS — D509 Iron deficiency anemia, unspecified: Secondary | ICD-10-CM | POA: Diagnosis not present

## 2022-06-08 DIAGNOSIS — M810 Age-related osteoporosis without current pathological fracture: Secondary | ICD-10-CM | POA: Insufficient documentation

## 2022-06-08 DIAGNOSIS — I4891 Unspecified atrial fibrillation: Secondary | ICD-10-CM | POA: Diagnosis not present

## 2022-06-08 DIAGNOSIS — E785 Hyperlipidemia, unspecified: Secondary | ICD-10-CM | POA: Insufficient documentation

## 2022-06-08 DIAGNOSIS — K219 Gastro-esophageal reflux disease without esophagitis: Secondary | ICD-10-CM | POA: Insufficient documentation

## 2022-06-08 DIAGNOSIS — Z853 Personal history of malignant neoplasm of breast: Secondary | ICD-10-CM

## 2022-06-08 DIAGNOSIS — Z8041 Family history of malignant neoplasm of ovary: Secondary | ICD-10-CM | POA: Insufficient documentation

## 2022-06-08 DIAGNOSIS — I10 Essential (primary) hypertension: Secondary | ICD-10-CM | POA: Diagnosis not present

## 2022-06-08 DIAGNOSIS — R911 Solitary pulmonary nodule: Secondary | ICD-10-CM | POA: Insufficient documentation

## 2022-06-08 DIAGNOSIS — E039 Hypothyroidism, unspecified: Secondary | ICD-10-CM | POA: Diagnosis not present

## 2022-06-08 DIAGNOSIS — Z86000 Personal history of in-situ neoplasm of breast: Secondary | ICD-10-CM | POA: Diagnosis not present

## 2022-06-08 DIAGNOSIS — Z801 Family history of malignant neoplasm of trachea, bronchus and lung: Secondary | ICD-10-CM | POA: Insufficient documentation

## 2022-06-08 DIAGNOSIS — Z170 Estrogen receptor positive status [ER+]: Secondary | ICD-10-CM | POA: Insufficient documentation

## 2022-06-08 DIAGNOSIS — Z85828 Personal history of other malignant neoplasm of skin: Secondary | ICD-10-CM | POA: Diagnosis not present

## 2022-06-12 ENCOUNTER — Telehealth (HOSPITAL_COMMUNITY): Payer: Self-pay

## 2022-06-13 ENCOUNTER — Encounter: Payer: Self-pay | Admitting: Hematology and Oncology

## 2022-06-13 ENCOUNTER — Inpatient Hospital Stay: Payer: Medicare HMO | Attending: Hematology and Oncology | Admitting: Hematology and Oncology

## 2022-06-13 ENCOUNTER — Telehealth: Payer: Self-pay

## 2022-06-13 ENCOUNTER — Encounter (HOSPITAL_COMMUNITY)
Admission: RE | Admit: 2022-06-13 | Discharge: 2022-06-13 | Disposition: A | Discharge status: Home / Self Care | Payer: Medicare HMO | Source: Ambulatory Visit | Attending: General Surgery | Admitting: General Surgery

## 2022-06-13 ENCOUNTER — Inpatient Hospital Stay: Payer: Medicare HMO

## 2022-06-13 ENCOUNTER — Other Ambulatory Visit: Payer: Self-pay

## 2022-06-13 ENCOUNTER — Ambulatory Visit (HOSPITAL_COMMUNITY)
Admission: RE | Admit: 2022-06-13 | Discharge: 2022-06-13 | Disposition: A | Discharge status: Home / Self Care | Payer: Medicare HMO | Source: Ambulatory Visit | Attending: General Surgery | Admitting: General Surgery

## 2022-06-13 ENCOUNTER — Ambulatory Visit (HOSPITAL_COMMUNITY)
Admission: RE | Admit: 2022-06-13 | Discharge: 2022-06-13 | Disposition: A | Discharge status: Home / Self Care | Payer: Medicare HMO | Source: Ambulatory Visit | Attending: Radiology | Admitting: Radiology

## 2022-06-13 VITALS — BP 126/78 | HR 76 | Temp 98.1°F | Resp 16 | Ht 64.0 in | Wt 137.1 lb

## 2022-06-13 DIAGNOSIS — Z825 Family history of asthma and other chronic lower respiratory diseases: Secondary | ICD-10-CM | POA: Insufficient documentation

## 2022-06-13 DIAGNOSIS — M19011 Primary osteoarthritis, right shoulder: Secondary | ICD-10-CM | POA: Diagnosis not present

## 2022-06-13 DIAGNOSIS — C50411 Malignant neoplasm of upper-outer quadrant of right female breast: Secondary | ICD-10-CM | POA: Diagnosis not present

## 2022-06-13 DIAGNOSIS — I4891 Unspecified atrial fibrillation: Secondary | ICD-10-CM | POA: Diagnosis not present

## 2022-06-13 DIAGNOSIS — I10 Essential (primary) hypertension: Secondary | ICD-10-CM | POA: Diagnosis not present

## 2022-06-13 DIAGNOSIS — Z85828 Personal history of other malignant neoplasm of skin: Secondary | ICD-10-CM | POA: Diagnosis not present

## 2022-06-13 DIAGNOSIS — Z801 Family history of malignant neoplasm of trachea, bronchus and lung: Secondary | ICD-10-CM | POA: Insufficient documentation

## 2022-06-13 DIAGNOSIS — Z7901 Long term (current) use of anticoagulants: Secondary | ICD-10-CM | POA: Insufficient documentation

## 2022-06-13 DIAGNOSIS — Z833 Family history of diabetes mellitus: Secondary | ICD-10-CM | POA: Diagnosis not present

## 2022-06-13 DIAGNOSIS — Z8052 Family history of malignant neoplasm of bladder: Secondary | ICD-10-CM | POA: Diagnosis not present

## 2022-06-13 DIAGNOSIS — Z170 Estrogen receptor positive status [ER+]: Secondary | ICD-10-CM | POA: Insufficient documentation

## 2022-06-13 DIAGNOSIS — Z809 Family history of malignant neoplasm, unspecified: Secondary | ICD-10-CM | POA: Insufficient documentation

## 2022-06-13 DIAGNOSIS — E039 Hypothyroidism, unspecified: Secondary | ICD-10-CM | POA: Diagnosis not present

## 2022-06-13 DIAGNOSIS — Z79810 Long term (current) use of selective estrogen receptor modulators (SERMs): Secondary | ICD-10-CM | POA: Diagnosis not present

## 2022-06-13 DIAGNOSIS — S22060A Wedge compression fracture of T7-T8 vertebra, initial encounter for closed fracture: Secondary | ICD-10-CM | POA: Diagnosis not present

## 2022-06-13 DIAGNOSIS — Z79899 Other long term (current) drug therapy: Secondary | ICD-10-CM | POA: Insufficient documentation

## 2022-06-13 DIAGNOSIS — Z808 Family history of malignant neoplasm of other organs or systems: Secondary | ICD-10-CM | POA: Insufficient documentation

## 2022-06-13 DIAGNOSIS — Z923 Personal history of irradiation: Secondary | ICD-10-CM | POA: Insufficient documentation

## 2022-06-13 DIAGNOSIS — F419 Anxiety disorder, unspecified: Secondary | ICD-10-CM | POA: Diagnosis not present

## 2022-06-13 DIAGNOSIS — Z9012 Acquired absence of left breast and nipple: Secondary | ICD-10-CM | POA: Insufficient documentation

## 2022-06-13 DIAGNOSIS — F32A Depression, unspecified: Secondary | ICD-10-CM | POA: Insufficient documentation

## 2022-06-13 DIAGNOSIS — C50911 Malignant neoplasm of unspecified site of right female breast: Secondary | ICD-10-CM | POA: Diagnosis not present

## 2022-06-13 DIAGNOSIS — Z79890 Hormone replacement therapy (postmenopausal): Secondary | ICD-10-CM | POA: Diagnosis not present

## 2022-06-13 DIAGNOSIS — Z8249 Family history of ischemic heart disease and other diseases of the circulatory system: Secondary | ICD-10-CM | POA: Diagnosis not present

## 2022-06-13 DIAGNOSIS — R221 Localized swelling, mass and lump, neck: Secondary | ICD-10-CM | POA: Diagnosis not present

## 2022-06-13 DIAGNOSIS — Z818 Family history of other mental and behavioral disorders: Secondary | ICD-10-CM | POA: Diagnosis not present

## 2022-06-13 DIAGNOSIS — Z90710 Acquired absence of both cervix and uterus: Secondary | ICD-10-CM | POA: Diagnosis not present

## 2022-06-13 DIAGNOSIS — Z8041 Family history of malignant neoplasm of ovary: Secondary | ICD-10-CM | POA: Diagnosis not present

## 2022-06-13 DIAGNOSIS — M19031 Primary osteoarthritis, right wrist: Secondary | ICD-10-CM | POA: Diagnosis not present

## 2022-06-13 MED ORDER — TECHNETIUM TC 99M MEDRONATE IV KIT
20.0000 | PACK | Freq: Once | INTRAVENOUS | Status: AC | PRN
  Administered 2022-06-13: 22 via INTRAVENOUS

## 2022-06-13 MED ORDER — SODIUM CHLORIDE (PF) 0.9 % IJ SOLN
INTRAMUSCULAR | Status: AC
  Filled 2022-06-13: qty 50

## 2022-06-13 MED ORDER — IOHEXOL 300 MG/ML  SOLN
75.0000 mL | Freq: Once | INTRAMUSCULAR | Status: AC | PRN
  Administered 2022-06-13: 75 mL via INTRAVENOUS

## 2022-06-13 NOTE — Progress Notes (Signed)
[place] NOTE  Patient Care Team: System, Provider Not In as PCP - General Croitoru, Dani Gobble, MD as PCP - Cardiology (Cardiology) Croitoru, Dani Gobble, MD as Consulting Physician (Cardiology) [place]ique, Amy, MD as Consulting Physician (Dermatology) Miller, Tami Lin, [place] as Physician Assistant (Cardiology) Mauro Kaufmann, RN as Oncology Nurse Navigator Rockwell [place], RN as Oncology Nurse Navigator Benay Pike, MD as Consulting Physician (Hematology and Oncology)  CHIEF COMPLAINTS/PURPOSE OF CONSULTATION:  Newly diagnosed breast cancer  HISTORY OF PRESENTING ILLNESS:  Deborah Randall 82 y.o. female is here because of recent diagnosis of right breast cancer.  I reviewed her records extensively and collaborated the history with the patient.  SUMMARY OF ONCOLOGIC HISTORY: Oncology History  Malignant neoplasm of upper-outer quadrant of right breast in female, estrogen receptor positive ([place])  05/18/2022 Pathology Results   Invasive poorly differentiated ductal adenocarcinoma, grade 3. ER PR 100% strongly positive, Her 2 negative.   06/08/2022 Initial Diagnosis   Malignant neoplasm of upper-outer quadrant of right breast in female, estrogen receptor positive ([place])   06/08/2022 Cancer Staging   Staging form: Breast, AJCC 8th Edition - Clinical stage from 06/08/2022: Stage IIA (cT2, cN0, cM0, G3, ER+, PR+, HER2-) - Signed by Marlynn Perking, PA-C on 06/08/2022 Stage prefix: Initial diagnosis Histologic grading system: 3 grade system    She is currently scheduled for right simple mastectomy on 06/22/2022. She had breast cancer twice. She had right invasive apocrine mammary carcinoma measuring 2.5 mm grade 2 or 3 back in 2002 underwent right breast lumpectomy, she also had DCIS high-grade.  Postlumpectomy she underwent radiation.  She tried 1 of antiestrogens for about a year but it was too expensive for her so she stopped it.  She could not remember the exact medication but  thinks it could be tamoxifen.  Later in 2005, she underwent left breast mastectomy which once again showed high-grade DCIS with necrosis and calcifications, margins negative.  No evidence of Paget's disease of the nipple.  She arrived to the appointment today with her son.  Her husband died from cancer, underwent chemo and radiation.  She is not interested in genetic testing at this time.  MEDICAL HISTORY:  Past Medical History:  Diagnosis Date   Anxiety    Atrial fibrillation with rapid ventricular response ([place]) 07/07/2015   Bilateral breast cancer (HCC)    Daily headache    Depression    GERD (gastroesophageal reflux disease)    Hyperlipidemia    Hypertension    Hypothyroidism    Iron deficiency anemia 2000s   Lumbago    Nausea alone    Senile osteoporosis    Squamous cell carcinoma of forehead    "I think they burned it off"    SURGICAL HISTORY: Past Surgical History:  Procedure Laterality Date   ABDOMINAL HYSTERECTOMY  1970s   BREAST BIOPSY Bilateral    BREAST BIOPSY Right 05/18/2022   [place] RT BREAST BX W LOC DEV 1ST LESION IMG BX SPEC US GUIDE 05/18/2022 [facility]AMMOGRAPHY   BREAST LUMPECTOMY Right 2002   "w/lymph nodes removed"   CARDIOVERSION N/A 08/19/2015   Procedure: CARDIOVERSION;  Surgeon: Sanda Klein, MD;  Location: [facility];  Service: Cardiovascular;  Laterality: N/A;   CATARACT EXTRACTION W/ INTRAOCULAR LENS  IMPLANT, BILATERAL Bilateral 2000s   DILATION AND CURETTAGE OF UTERUS  ~ [place] Left 2011   "has 2 screws in it"   EXCISIONAL HEMORRHOIDECTOMY  ~ [place]  02/12/2016   Right Wrist   HEMIARTHROPLASTY HIP Left 2011   "had ball replaced"   [place] Right 1980s?   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   Tobacco Use   Smoking status: Never   Smokeless tobacco: Never  Vaping Use   Vaping Use: Never used  Substance and Sexual Activity   Alcohol use: No   Drug use: No   Sexual activity: Never  Other Topics Concern   Not on file  Social History Narrative   Lives alone   Widow   2 sons , one son decease, one son disable    Works at [employer] in [employer]   Enjoys going places with friends   Never smoked   Alcohol none   Exercise -walk, works in [place] Strain: [place]  (05/16/2017)   Overall Financial Resource Strain (CARDIA)    Difficulty of Paying Living Expenses: Not hard at all  Food Insecurity: No [place] (05/16/2017)   Hunger Vital Sign    Worried About Running Out of Food in the Last Year: Never true    [place] in the Last Year: Never true  Transportation Needs: No Transportation Needs (05/16/2017)   PRAPARE - Hydrologist (Medical): No    Lack of Transportation (Non-Medical): No  Physical Activity: Inactive (05/16/2017)   Exercise Vital Sign    Days of Exercise per Week: 0 days    Minutes of Exercise per Session: 0 min  Stress: No Stress Concern Present (05/16/2017)   [place]    Feeling of Stress : Not at all  Social Connections: Moderately Integrated (05/16/2017)   Social Connection and Isolation Panel [NHANES]    Frequency of Communication with Friends and Family: More than three times a week    Frequency of Social  Gatherings with Friends and Family: Three times a week    Attends Religious Services: 1 to 4 times per year    Active Member of Clubs or Organizations: Yes    Attends Archivist Meetings: Never    Marital Status: Widowed  Intimate Partner Violence: Not At Risk (05/16/2017)   Humiliation, Afraid, Rape, and Kick questionnaire    Fear of Current or Ex-Partner: No    Emotionally Abused: No    Physically Abused: No    Sexually Abused: No    FAMILY HISTORY: Family History  Problem Relation Age of Onset   Cancer Father    Lung cancer Sister    Heart disease Sister    Hypertension Sister    Ovarian cancer Sister    Heart attack Brother    Other Brother    Skin cancer Brother    Other Brother  Other Brother    Alzheimer's disease Brother    Bladder Cancer Brother    Diabetes Brother    COPD Brother    Mental illness Son    Mental illness Son     ALLERGIES:  has No Known Allergies.  MEDICATIONS:  Current Outpatient Medications  Medication Sig Dispense Refill   acetaminophen (TYLENOL) 325 MG tablet Take 2 tablets (650 mg total) by mouth every 4 (four) hours as needed for headache or mild pain.     albuterol (VENTOLIN HFA) 108 (90 Base) MCG/ACT inhaler Inhale 1 puff into the lungs 3 (three) times daily as needed for shortness of breath.     ALPRAZolam (XANAX) 0.5 MG tablet TAKE 1 TABLET BY MOUTH DAILY FOR ANXIETY 30 tablet 5   calcium citrate (CALCITRATE - DOSED IN MG ELEMENTAL CALCIUM) 950 (200 Ca) MG tablet Take 200 mg of elemental calcium by mouth daily.     Cholecalciferol (VITAMIN D3) 2000 UNITS TABS Take 1 tablet by mouth daily.      docusate sodium (COLACE) 100 MG capsule Take 1 capsule (100 mg total) by mouth daily. 10 capsule 0   ELIQUIS 5 MG TABS tablet TAKE 1 TABLET BY MOUTH TWICE DAILY 180 tablet 1   furosemide (LASIX) 40 MG tablet TAKE 1 TABLET BY MOUTH DAILY 90 tablet 1   gabapentin (NEURONTIN) 100 MG capsule TAKE 1 CAPSULE BY MOUTH DAILY AT BEDTIME 90  capsule 1   ibuprofen (ADVIL) 200 MG tablet Take 200 mg by mouth daily as needed for headache or mild pain.     Iron, Ferrous Sulfate, 325 (65 Fe) MG TABS Take 325 mg by mouth daily. 30 tablet    levothyroxine (SYNTHROID) 137 MCG tablet Take 1 tablet (137 mcg total) by mouth daily. 60 tablet 1   meclizine (ANTIVERT) 12.5 MG tablet Take 12.5 mg by mouth as needed for dizziness (OTC for dizziness).     metoprolol tartrate (LOPRESSOR) 50 MG tablet TAKE 1 TABLET BY MOUTH TWICE DAILY 180 tablet 3   potassium chloride SA (KLOR-CON) 20 MEQ tablet Take 2 tablets (40 mEq total) by mouth daily. 180 tablet 3   rosuvastatin (CRESTOR) 10 MG tablet TAKE 1 TABLET BY MOUTH DAILY 90 tablet 2   sertraline (ZOLOFT) 25 MG tablet TAKE 2 TABLETS BY MOUTH DAILY 180 tablet 1   No current facility-administered medications for this visit.   Facility-Administered Medications Ordered in Other Visits  Medication Dose Route Frequency Provider Last Rate Last Admin   sodium chloride (PF) 0.9 % injection             REVIEW OF SYSTEMS:   Constitutional: Denies fevers, chills or abnormal night sweats Eyes: Denies blurriness of vision, double vision or watery eyes Ears, nose, mouth, throat, and face: Denies mucositis or sore throat Respiratory: Denies cough, dyspnea or wheezes Cardiovascular: Denies palpitation, chest discomfort or lower extremity swelling Gastrointestinal:  Denies nausea, heartburn or change in bowel habits Skin: Denies abnormal skin rashes Lymphatics: Denies new lymphadenopathy or easy bruising Neurological:Denies numbness, tingling or new weaknesses Behavioral/Psych: Mood is stable, no new changes  Breast: Denies any palpable lumps or discharge All other systems were reviewed with the patient and are negative.  PHYSICAL EXAMINATION: ECOG PERFORMANCE STATUS: 0 - Asymptomatic  Vitals:   06/13/22 1005  BP: 126/78  Pulse: 76  Resp: 16  Temp: 98.1 F (36.7 C)  SpO2: 98%   Filed Weights    06/13/22 1005  Weight: 137 lb 1.6 oz (62.2 kg)  GENERAL:alert, no distress and comfortable Breast: Right breast 4:00 palpable lump measuring a couple centimeters.  There is also small skin lesion right adjacent to the nipple at 4:00.  No regional adenopathy.  Left mastectomy site without any evidence of recurrence.  No cervical adenopathy palpated today.  LABORATORY DATA:  I have reviewed the data as listed Lab Results  Component Value Date   WBC 8.4 04/25/2022   HGB 14.8 04/25/2022   HCT 44.6 04/25/2022   MCV 92.7 04/25/2022   PLT 147 04/25/2022   Lab Results  Component Value Date   NA 144 04/25/2022   K 3.3 (L) 04/25/2022   CL 97 (L) 04/25/2022   CO2 36 (H) 04/25/2022    RADIOGRAPHIC STUDIES: I have personally reviewed the radiological reports and agreed with the findings in the report.  ASSESSMENT AND PLAN:  Malignant neoplasm of upper-outer quadrant of right breast in female, estrogen receptor positive ([place]) This is a very pleasant 82 yr female patient with newly diagnosed right breast 12 0 clock mass, pathology showed grade 3 IDC, ER Positive PR positive strong staining, Her 2 negative referred to medical oncology for recommendations. She had prior history of right breast invasive mammary carcinoma, apocrine subtype measuring 2.5 mm had prior right lumpectomy followed by adjuvant radiation, did not take antiestrogen therapy for more than several months.  She could not afford it.  She also had left breast mastectomy in 2005, pathology showed DCIS.  She did not take any radiation or antiestrogen therapy following this.  She refuses Oncotype testing.  She tells me that her husband went through chemo and radiation and she would not like to know if she needs chemotherapy.  I think this is very reasonable given her age and comorbidities.  We have discussed about antiestrogen therapy after completing mastectomy.  We have discussed about both tamoxifen as well as aromatase  inhibitors.  She has osteoporosis at baseline and has previously taken Fosamax but currently not on any medication.  She is also on blood thinners for atrial fibrillation.  No personal history of VTE.  We have discussed about risk of VTE and benefit on bone density from tamoxifen.  She had hysterectomy hence no concern about endometrial complications from tamoxifen.  I think she will do well on tamoxifen adjuvantly.  We recommended it for at least 5 years.  She expressed understanding of all the recommendations.  She will return to clinic mid February for follow-up to review final pathology and to discuss any additional recommendations.   Total time spent: 45 minutes including history, physical exam, review of records, counseling and coordination of care All questions were answered. The patient knows to call the clinic with any problems, questions or concerns.    Benay Pike, MD 06/13/22

## 2022-06-13 NOTE — Assessment & Plan Note (Addendum)
This is a very pleasant 82 yr female patient with newly diagnosed right breast 12 0 clock mass, pathology showed grade 3 IDC, ER Positive PR positive strong staining, Her 2 negative referred to medical oncology for recommendations. She had prior history of right breast invasive mammary carcinoma, apocrine subtype measuring 2.5 mm had prior right lumpectomy followed by adjuvant radiation, did not take antiestrogen therapy for more than several months.  She could not afford it.  She also had left breast mastectomy in 2005, pathology showed DCIS.  She did not take any radiation or antiestrogen therapy following this.  She refuses Oncotype testing.  She tells me that her husband went through chemo and radiation and she would not like to know if she needs chemotherapy.  I think this is very reasonable given her age and comorbidities.  We have discussed about antiestrogen therapy after completing mastectomy.  We have discussed about both tamoxifen as well as aromatase inhibitors.  She has osteoporosis at baseline and has previously taken Fosamax but currently not on any medication.  She is also on blood thinners for atrial fibrillation.  No personal history of VTE.  We have discussed about risk of VTE and benefit on bone density from tamoxifen.  She had hysterectomy hence no concern about endometrial complications from tamoxifen.  I think she will do well on tamoxifen adjuvantly.  We recommended it for at least 5 years.  She expressed understanding of all the recommendations.  She will return to clinic mid February for follow-up to review final pathology and to discuss any additional recommendations.

## 2022-06-13 NOTE — Telephone Encounter (Signed)
Called to make patient aware of negative results of CT to neck. Patient voiced understanding.

## 2022-06-13 NOTE — Progress Notes (Signed)
Could you please let pt know CT of the neck came back negative? No concerning findings for metastatic disease.

## 2022-06-14 ENCOUNTER — Encounter: Payer: Self-pay | Admitting: *Deleted

## 2022-06-14 ENCOUNTER — Encounter (HOSPITAL_COMMUNITY): Payer: Medicare HMO

## 2022-06-19 NOTE — H&P (Signed)
REFERRING Provider:  Ngetich     PROVIDER:  Georgianne Fick, MD   Care Team: Patient Care Team: Ngetich, Nelda Bucks, NP as PCP - General (Family Medicine) Georgianne Fick, MD as Consulting Provider (Surgical Oncology) Sanda Klein, MD (Cardiovascular Disease)    MRN: [id number] DOB: 1941-02-18 DATE OF ENCOUNTER: 05/29/2022   Subjective    Chief Complaint: Breast Cancer and New Consultation       History of Present Illness: Deborah Randall is a 82 y.o. female who is seen today as an office consultation at the request of Dr. Kelby Fam for evaluation of Breast Cancer and New Consultation     Patient presents with a new diagnosis of right breast cancer 04/2022.  The patient had a palpable mass at 4:00 on the right breast and underwent diagnostic mammography.  Diagnostic mammography showed a 2.1 cm mass at 12:00 and the palpable mass appeared to be a 1.3 cm lesion that was in the skin.  She underwent core needle biopsy of the 12:00 lesion and this showed a grade 3 invasive ductal carcinoma.  This is strongly ER and PR positive and HER2 is negative.  Ki-67 is 10%.     The patient states that she felt something there around 2 months ago as well as a skin lesion near her nipple.  She thought it was just scar tissue in her lumpectomy scar but showed her granddaughter who called and bugged her everyday until she had an appointment to get it evaluated.     Of note, she has had a previous mastectomy for breast cancer in 2005 and has had a right lumpectomy and ALND on the right in the past.  She also had radiation on the right.  After at least one of her cancers she took tamoxifen.     The patient does her own housework and shopping.  She plants flowers in her yard.  She does have to rest after a few hours.  She has had new low back pain and some persistent RLQ pain.    The patient has a fib and is on eliquis.  This is managed by Dr. Sallyanne Kuster.  She has never had a stroke or MI.     Diagnostic  mammogram/us : 05/19/2022 ACR Breast Density Category b: There are scattered areas of fibroglandular density.   FINDINGS: A BB indicating a palpable site of concern has been placed along the upper-outer right breast near the patient's lumpectomy site. There is a new oval mass with indistinct margins at this site which measures approximately 2 cm. The second palpable site is just medial to the nipple. There is a small asymmetry on the spot compression tomosynthesis images in the craniocaudal projection. No correlate is seen on the lateral views. The patient has had a left mastectomy.   Ultrasound of the right breast at 4 o'clock, 1 cm from the nipple demonstrates a small hypoechoic mass within the skin measuring approximately 1.3 x 0.4 x 1.2 cm. In the right breast at 12 o'clock, 3 cm from the nipple there is an irregular hypoechoic mass with indistinct margins measuring 2.1 x 1.5 x 2.0 cm. No lymph nodes are found in the right axilla.   IMPRESSION: 1. There is a highly suspicious mass in the right breast at 4 o'clock measuring 2.1 cm.   2. The palpable lump in the right breast at 4 o'clock corresponds with a small lesion within the skin.   3.  No abnormal right axillary lymph nodes are identified.  RECOMMENDATION: Ultrasound guided biopsy is recommended for the right breast mass. This has been scheduled for 05/18/2022 at 10:30 a.m.   I have discussed the findings and recommendations with the patient. If applicable, a reminder letter will be sent to the patient regarding the next appointment.   BI-RADS CATEGORY  5: Highly suggestive of malignancy.   Pathology core needle biopsy: 05/18/2022 INVASIVE POORLY DIFFERENTIATED DUCTAL ADENOCARCINOMA, GRADE 3 (3+2+3) TUBULE FORMATION: SCORE 3 NUCLEAR PLEOMORPHISM: SCORE 2 MITOTIC COUNT: SCORE 3 OVERALL GRADE GRADE 3 (8/9) RARE MICROCALCIFICATIONS PRESENT FOCALLY ADJACENT TO A BENIGN DUCT AND WITHIN PERITUMORAL STROMA NEGATIVE FOR  LYMPHOVASCULAR INVASION TUMOR MEASURES 13 MM IN GREATEST LINEAR EXTENT   Receptors: Estrogen Receptor: 100%, POSITIVE, STRONG STAINING INTENSITY Progesterone Receptor: 100%, POSITIVE, STRONG STAINING INTENSITY Proliferation Marker Ki67: 10% GROUP 5: HER2 **NEGATIVE**   Review of Systems: A complete review of systems was obtained from the patient.  I have reviewed this information and discussed as appropriate with the patient.  See HPI as well for other ROS. ROS is otherwise positive for weight loss, abdominal pain, constipation, easy bruising, depression and anxiety.         Medical History: Past Medical History      Past Medical History:  Diagnosis Date   Arthritis     History of cancer     Hypertension     Thyroid disease             Patient Active Problem List  Diagnosis   Malignant neoplasm of upper-outer quadrant of right breast in female, estrogen receptor positive    Skin lesion of breast   History of left breast cancer   History of right breast cancer   Chronic atrial fibrillation (CMS-HCC)   On continuous oral anticoagulation      Past Surgical History       Past Surgical History:  Procedure Laterality Date   AJCC BREAST CANCER STAGE I: T1MIC, T1A OR T1B (TUMOR SIZE < 1 CM) DOCUMENTED (ONC)       masectomy Left     MASTECTOMY PARTIAL / LUMPECTOMY Right          Allergies  No Known Allergies           Current Outpatient Medications on File Prior to Visit  Medication Sig Dispense Refill   albuterol 90 mcg/actuation inhaler Inhale into the lungs       ALPRAZolam (XANAX) 0.5 MG tablet TAKE 1 TABLET BY MOUTH DAILY FOR ANXIETY       docusate (COLACE) 100 MG capsule Take by mouth       ELIQUIS 5 mg tablet Take 5 mg by mouth 2 (two) times daily       ferrous sulfate 325 (65 FE) MG tablet Take by mouth       FUROsemide (LASIX) 40 MG tablet Take 1 tablet by mouth once daily       gabapentin (NEURONTIN) 100 MG capsule TAKE 1 CAPSULE BY MOUTH DAILY AT BEDTIME        ibuprofen (MOTRIN) 200 MG tablet Take by mouth       levothyroxine (SYNTHROID) 125 MCG tablet Take 125 mcg by mouth once daily       meclizine (ANTIVERT) 12.5 mg tablet Take by mouth       metoprolol tartrate (LOPRESSOR) 50 MG tablet Take 50 mg by mouth 2 (two) times daily       potassium chloride (KLOR-CON) 20 MEQ ER tablet Take by mouth  rosuvastatin (CRESTOR) 10 MG tablet Take 1 tablet by mouth once daily       sertraline (ZOLOFT) 25 MG tablet Take 2 tablets by mouth once daily        No current facility-administered medications on file prior to visit.      Family History       Family History  Problem Relation Age of Onset   Coronary Artery Disease (Blocked arteries around heart) Father     Deep vein thrombosis (DVT or abnormal blood clot formation) Sister     Coronary Artery Disease (Blocked arteries around heart) Brother     Diabetes Brother          Social History       Tobacco Use  Smoking Status Never  Smokeless Tobacco Never      Social History  Social History        Socioeconomic History   Marital status: Widowed  Tobacco Use   Smoking status: Never   Smokeless tobacco: Never  Substance and Sexual Activity   Alcohol use: Not Currently   Drug use: Never        Objective:         Vitals:    05/29/22 1351  BP: 116/73  Pulse: 84  Temp: 36.7 C (98 F)  SpO2: 97%  Weight: 62.1 kg (137 lb)  Height: 160 cm ('5\' 3"'$ )    Body mass index is 24.27 kg/m.   Gen:  No acute distress.  Well nourished and well groomed.   Neurological: Alert and oriented to person, place, and time. Coordination normal.  Head: Normocephalic and atraumatic.  Eyes: Conjunctivae are normal. Pupils are equal, round, and reactive to light. No scleral icterus.  Neck: Normal range of motion. Neck supple. No tracheal deviation or thyromegaly present.  Cardiovascular: Normal rate, regular rhythm, normal heart sounds and intact distal pulses.  Exam reveals no gallop and no  friction rub.  No murmur heard. Breast: Left breast is surgically absent.  There are no chest wall masses in this occasion.  The right breast has an obvious contour deformity around 12:00.  This mass feels to be around 3 cm.  It does feel mobile and is nontender.  She has what appears to be a sebaceous cyst at 4:00 a little bit off the border of the areola.  Patient has very indented right axilla consistent with prior axillary lymph node dissection and radiation.  She has decreased range of motion with both sides going to around 170-180  degrees Respiratory: Effort normal.  No respiratory distress. No chest wall tenderness. Breath sounds normal.  No wheezes, rales or rhonchi.  GI: Soft. Bowel sounds are normal. The abdomen is soft and nontender.  There is no rebound and no guarding.  Musculoskeletal: Normal range of motion. Extremities are nontender.  Lymphadenopathy: No cervical, preauricular, postauricular or axillary adenopathy is present Skin: Skin is warm and dry. No rash noted. No diaphoresis. No erythema. No pallor. No clubbing, cyanosis, or edema.   Psychiatric: Normal mood and affect. Behavior is normal. Judgment and thought content normal.      Labs Labs 04/25/2022 Cr 1.34, K 3.3.     Assessment and Plan:        ICD-10-CM    1. Skin lesion of breast  L98.8       2. Malignant neoplasm of upper-outer quadrant of right breast in female, estrogen receptor positive   C50.411 Ambulatory Referral to Oncology-Medical    Z17.0 Ambulatory Referral to Radiation  Oncology      Ambulatory Referral to Cancer Genetics - [phone]      CT chest abdomen pelvis with contrast w MIPS      NM bone scan whole body     3. History of left breast cancer  Z85.3 Ambulatory Referral to Oncology-Medical      Ambulatory Referral to Radiation Oncology      Ambulatory Referral to [place]      Ambulatory Referral to Physical Therapy     4. History of right breast cancer  Z85.3 Ambulatory Referral  to Oncology-Medical      Ambulatory Referral to Radiation Oncology      Ambulatory Referral to [place]      Ambulatory Referral to Physical Therapy     5. Chronic midline low back pain without sciatica  M54.50 NM bone scan whole body    G89.29       6. On continuous oral anticoagulation  Z79.01       7. Chronic atrial fibrillation (CMS-HCC)  I48.20         Patient has a new diagnosis of clinical T2 N0 right breast cancer.  This has been quite a long time so it is likely a new cancer and not a recurrent cancer.  Because of her previous radiation, the size of the tumor, and the fact that this is poorly differentiated, I do recommend a mastectomy.  Also this is her third diagnosis of cancer.   I am referring her to genetics, medical oncology, and radiation oncology.  Her family is especially concerned about her multiple incidences of malignancy.   We have sent a request to her cardiologist regarding whether we can hold her Eliquis or whether we have to do a bridging Lovenox.   I am sending her to physical therapy preoperatively given that she already has some restricted range of motion from her previous breast and axillary surgery.   She does have some back pain that is, but over the past 6 months or so as well as persistent right lower quadrant pain.  It is the size of the tumor, the fact that her axillary lymph nodes have been removed, and the persistence of the symptoms, I am ordering a CT of the chest abdomen pelvis and a bone scan.   I discussed mastectomy with the patient and her son.  I reviewed that I would try to make her incision relatively symmetric to the other side.  I reviewed the risk of bleeding, infection, wound breakdown, stay in the hospital, chronic pain, positive margins, recurrent cancer, heart or lung complications, blood clot, death.   They understand and wish to proceed.

## 2022-06-20 ENCOUNTER — Inpatient Hospital Stay: Payer: Medicare HMO | Admitting: Licensed Clinical Social Worker

## 2022-06-20 NOTE — Progress Notes (Signed)
Surgical Instructions    Your procedure is scheduled on Thursday, 06/22/22.  Report to [facility] Main Entrance "A" at 9:00 A.M., then check in with the Admitting office.  Call this number if you have problems the morning of surgery:  [phone]   If you have any questions prior to your surgery date call [phone]: Open Monday-Friday 8am-4pm If you experience any cold or flu symptoms such as cough, fever, chills, shortness of breath, etc. between now and your scheduled surgery, please notify us at the above number     Remember:  Do not eat after midnight the night before your surgery  You may drink clear liquids until 8:00am the morning of your surgery.   Clear liquids allowed are: Water, Non-Citrus Juices (without pulp), Carbonated Beverages, Clear Tea, Black Coffee ONLY (NO MILK, CREAM OR POWDERED CREAMER of any kind), and Gatorade    Take these medicines the morning of surgery with A SIP OF WATER:  levothyroxine (SYNTHROID)  metoprolol tartrate (LOPRESSOR)  rosuvastatin (CRESTOR)  sertraline (ZOLOFT)   IF NEEDED: acetaminophen (TYLENOL)  albuterol (VENTOLIN HFA) inhaler- bring with you the day of surgery ALPRAZolam (XANAX)  meclizine (ANTIVERT)   As of today, STOP taking any Aspirin (unless otherwise instructed by your surgeon) Aleve, Naproxen, Ibuprofen, Motrin, Advil, Goody's, BC's, all herbal medications, fish oil, and all vitamins.  Please stop taking ELIQUIS 3 days prior to surgery. Your last dose will be 06/18/22.           Do not wear jewelry or makeup. Do not wear lotions, powders, perfumes or deodorant. Do not shave 48 hours prior to surgery.  Do not bring valuables to the hospital. Do not wear nail polish, gel polish, artificial nails, or any other type of covering on natural nails (fingers and toes) If you have artificial nails or gel coating that need to be removed by a nail salon, please have this removed prior to surgery. Artificial nails or gel coating may  interfere with anesthesia's ability to adequately monitor your vital signs.  [facility] is not responsible for any belongings or valuables.    Do NOT Smoke (Tobacco/Vaping)  24 hours prior to your procedure  If you use a CPAP at night, you may bring your mask for your overnight stay.   Contacts, glasses, hearing aids, dentures or partials may not be worn into surgery, please bring cases for these belongings   For patients admitted to the hospital, discharge time will be determined by your treatment team.   Patients discharged the day of surgery will not be allowed to drive home, and someone needs to stay with them for 24 hours.   SURGICAL WAITING ROOM VISITATION Patients having surgery or a procedure may have no more than 2 support people in the waiting area - these visitors may rotate.   Children under the age of 53 must have an adult with them who is not the patient. If the patient needs to stay at the hospital during part of their recovery, the visitor guidelines for inpatient rooms apply. Pre-op nurse will coordinate an appropriate time for 1 support person to accompany patient in pre-op.  This support person may not rotate.   Please refer to [email or web address] for the visitor guidelines for Inpatients (after your surgery is over and you are in a regular room).    Special instructions:    Oral Hygiene is also important to reduce your risk of infection.  Remember - BRUSH YOUR TEETH THE MORNING OF SURGERY WITH YOUR  REGULAR TOOTHPASTE   [place]- Preparing For Surgery  Before surgery, you can play an important role. Because skin is not sterile, your skin needs to be as free of germs as possible. You can reduce the number of germs on your skin by washing with CHG (chlorahexidine gluconate) Soap before surgery.  CHG is an antiseptic cleaner which kills germs and bonds with the skin to continue killing germs even after washing.      Please do not use if you have an allergy to CHG or antibacterial soaps. If your skin becomes reddened/irritated stop using the CHG.  Do not shave (including legs and underarms) for at least 48 hours prior to first CHG shower. It is OK to shave your face.  Please follow these instructions carefully.     Shower the NIGHT BEFORE SURGERY and the MORNING OF SURGERY with CHG Soap.   If you chose to wash your hair, wash your hair first as usual with your normal shampoo. After you shampoo, rinse your hair and body thoroughly to remove the shampoo.  Then [employer] and genitals (private parts) with your normal soap and rinse thoroughly to remove soap.  After that Use CHG Soap as you would any other liquid soap. You can apply CHG directly to the skin and wash gently with a scrungie or a clean washcloth.   Apply the CHG Soap to your body ONLY FROM THE NECK DOWN.  Do not use on open wounds or open sores. Avoid contact with your eyes, ears, mouth and genitals (private parts). Wash Face and genitals (private parts)  with your normal soap.   Wash thoroughly, paying special attention to the area where your surgery will be performed.  Thoroughly rinse your body with warm water from the neck down.  DO NOT shower/wash with your normal soap after using and rinsing off the CHG Soap.  Pat yourself dry with a CLEAN TOWEL.  Wear CLEAN PAJAMAS to bed the night before surgery  Place CLEAN SHEETS on your bed the night before your surgery  DO NOT SLEEP WITH PETS.   Day of Surgery: Take a shower with CHG soap. Wear Clean/Comfortable clothing the morning of surgery Do not apply any deodorants/lotions.   Remember to brush your teeth WITH YOUR REGULAR TOOTHPASTE.    If you received a COVID test during your pre-op visit, it is requested that you wear a mask when out in public, stay away from anyone that may not be feeling well, and notify your surgeon if you develop symptoms. If you have been in contact with  anyone that has tested positive in the last 10 days, please notify your surgeon.    Please read over the following fact sheets that you were given.

## 2022-06-20 NOTE — Progress Notes (Signed)
[place] Work  Clinical Social Work was referred by new patient protocol for assessment of psychosocial needs.  Clinical Social Worker contacted patient by phone  to offer support and assess for needs.    Patient reports that she is doing okay since diagnosis. She is a little worried this time (has had breast cancer before) because of her age, but overall feels she knows what to expect and is ready.  She has strong support from her son and her granddaughter. Denied any SDOH needs.   CSW provided direct contact information and encouraged pt to call if any support needs arise.     [place], [employer] Worker [employer]

## 2022-06-21 ENCOUNTER — Telehealth: Payer: Self-pay | Admitting: Family

## 2022-06-21 ENCOUNTER — Inpatient Hospital Stay (HOSPITAL_COMMUNITY): Admission: RE | Admit: 2022-06-21 | Payer: Medicare HMO | Source: Ambulatory Visit

## 2022-06-21 ENCOUNTER — Other Ambulatory Visit: Payer: Self-pay

## 2022-06-21 ENCOUNTER — Encounter (HOSPITAL_COMMUNITY)
Admission: RE | Admit: 2022-06-21 | Discharge: 2022-06-21 | Disposition: A | Discharge status: Home / Self Care | Payer: Medicare HMO | Source: Ambulatory Visit | Attending: General Surgery | Admitting: General Surgery

## 2022-06-21 ENCOUNTER — Encounter (HOSPITAL_COMMUNITY): Payer: Self-pay

## 2022-06-21 VITALS — BP 107/71 | HR 71 | Temp 97.8°F | Resp 16 | Ht 64.0 in | Wt 136.2 lb

## 2022-06-21 DIAGNOSIS — Z923 Personal history of irradiation: Secondary | ICD-10-CM | POA: Insufficient documentation

## 2022-06-21 DIAGNOSIS — E876 Hypokalemia: Secondary | ICD-10-CM | POA: Diagnosis not present

## 2022-06-21 DIAGNOSIS — Z9012 Acquired absence of left breast and nipple: Secondary | ICD-10-CM | POA: Diagnosis not present

## 2022-06-21 DIAGNOSIS — Z01818 Encounter for other preprocedural examination: Secondary | ICD-10-CM

## 2022-06-21 DIAGNOSIS — Z85828 Personal history of other malignant neoplasm of skin: Secondary | ICD-10-CM | POA: Diagnosis not present

## 2022-06-21 DIAGNOSIS — I10 Essential (primary) hypertension: Secondary | ICD-10-CM | POA: Insufficient documentation

## 2022-06-21 DIAGNOSIS — Z01812 Encounter for preprocedural laboratory examination: Secondary | ICD-10-CM | POA: Insufficient documentation

## 2022-06-21 DIAGNOSIS — C50911 Malignant neoplasm of unspecified site of right female breast: Secondary | ICD-10-CM | POA: Insufficient documentation

## 2022-06-21 DIAGNOSIS — Z7901 Long term (current) use of anticoagulants: Secondary | ICD-10-CM | POA: Insufficient documentation

## 2022-06-21 DIAGNOSIS — I4819 Other persistent atrial fibrillation: Secondary | ICD-10-CM | POA: Insufficient documentation

## 2022-06-21 DIAGNOSIS — E785 Hyperlipidemia, unspecified: Secondary | ICD-10-CM | POA: Insufficient documentation

## 2022-06-21 HISTORY — DX: Other specified postprocedural states: Z98.890

## 2022-06-21 HISTORY — DX: Other specified postprocedural states: R11.2

## 2022-06-21 LAB — CBC
HCT: 42.6 % (ref 36.0–46.0)
Hemoglobin: 13.7 g/dL (ref 12.0–15.0)
MCH: 30.2 pg (ref 26.0–34.0)
MCHC: 32.2 g/dL (ref 30.0–36.0)
MCV: 94 fL (ref 80.0–100.0)
Platelets: 122 10*3/uL — ABNORMAL LOW (ref 150–400)
RBC: 4.53 MIL/uL (ref 3.87–5.11)
RDW: 12.8 % (ref 11.5–15.5)
WBC: 7.4 10*3/uL (ref 4.0–10.5)
nRBC: 0 % (ref 0.0–0.2)

## 2022-06-21 LAB — BASIC METABOLIC PANEL
Anion gap: 12 (ref 5–15)
BUN: 12 mg/dL (ref 8–23)
CO2: 28 mmol/L (ref 22–32)
Calcium: 8.9 mg/dL (ref 8.9–10.3)
Chloride: 101 mmol/L (ref 98–111)
Creatinine, Ser: 1.28 mg/dL — ABNORMAL HIGH (ref 0.44–1.00)
GFR, Estimated: 42 mL/min — ABNORMAL LOW (ref >60)
Glucose, Bld: 88 mg/dL (ref 70–99)
Potassium: 2.9 mmol/L — ABNORMAL LOW (ref 3.5–5.1)
Sodium: 141 mmol/L (ref 135–145)

## 2022-06-21 NOTE — Telephone Encounter (Signed)
Noted  

## 2022-06-21 NOTE — Telephone Encounter (Signed)
Tried calling patient, no answer and voicemail full.

## 2022-06-21 NOTE — Progress Notes (Addendum)
PCP - Dinah Ngetich Cardiologist - Mihai Croitoru  PPM/ICD - denies   Chest x-ray - n/a EKG - 10/19/21 Stress Test - 2017 ECHO - 2021 Cardiac Cath - denies  Sleep Study - denies  ERAS Protcol -yes PRE-SURGERY Ensure or G2- not ordered  COVID TEST- not needed  Last dose of Eliquis 06/18/22. Stop 3 days prior to surgery   Anesthesia review: yes, review cardiac history. K 2.9- pcp notified. Pcp office will call patient and increase potassium dosage if necessary    Patient denies shortness of breath, fever, cough and chest pain at PAT appointment   All instructions explained to the patient, with a verbal understanding of the material. Patient agrees to go over the instructions while at home for a better understanding. Patient also instructed to self quarantine after being tested for COVID-19. The opportunity to ask questions was provided.

## 2022-06-21 NOTE — Telephone Encounter (Signed)
Message received from [place] D.Hoefler,RN states patient was seen today for Pre-Op visit potassium level was 2.9.Please verify with patient how much potassium chloride she is currently on. If taking 20 meq will need to increase to 40 meQ  x 3 days then follow up on 05/26/2022 to recheck BMP

## 2022-06-21 NOTE — Anesthesia Preprocedure Evaluation (Addendum)
Anesthesia Evaluation  Patient identified by MRN, date of birth, ID band Patient awake    Reviewed: Allergy & Precautions, NPO status , Patient's Chart, lab work & pertinent test results  History of Anesthesia Complications (+) PONV and history of anesthetic complications  Airway Mallampati: II  TM Distance: >3 FB Neck ROM: Full    Dental  (+) Edentulous Upper, Edentulous Lower   Pulmonary neg pulmonary ROS   Pulmonary exam normal breath sounds clear to auscultation       Cardiovascular hypertension (metoprolol), Pt. on home beta blockers (-) angina (-) Past MI, (-) Cardiac Stents and (-) CABG + dysrhythmias Atrial Fibrillation  Rhythm:Regular Rate:Normal  HLD  TTE 09/30/2019: IMPRESSIONS     1. Left ventricular ejection fraction, by estimation, is 55 to 60%. The  left ventricle has normal function. The left ventricle has no regional  wall motion abnormalities. Left ventricular diastolic function could not  be evaluated.   2. Right ventricular systolic function is normal. The right ventricular  size is normal. There is normal pulmonary artery systolic pressure. The  estimated right ventricular systolic pressure is 60.7 mmHg.   3. Left atrial size was severely dilated.   4. Right atrial size was moderately dilated.   5. The mitral valve is grossly normal. Mild to moderate mitral valve  regurgitation. No evidence of mitral stenosis.   6. Tricuspid valve regurgitation is mild to moderate.   7. The aortic valve is tricuspid. Aortic valve regurgitation is not  visualized. Mild to moderate aortic valve sclerosis/calcification is  present, without any evidence of aortic stenosis.   8. The inferior vena cava is normal in size with greater than 50%  respiratory variability, suggesting right atrial pressure of 3 mmHg.   Stress Test 07/15/2015: normal    Neuro/Psych  Headaches PSYCHIATRIC DISORDERS Anxiety Depression      Neuromuscular disease (lumbago)    GI/Hepatic Neg liver ROS,GERD  ,,  Endo/Other  Hypothyroidism    Renal/GU CRFRenal disease     Musculoskeletal osteoporosis   Abdominal   Peds  Hematology  (+) Blood dyscrasia (iron deficiency), anemia   Anesthesia Other Findings Right breast cancer  K 2.9  Last Eliquis: 06/18/2022  Reproductive/Obstetrics                             Anesthesia Physical Anesthesia Plan  ASA: 3  Anesthesia Plan: General   Post-op Pain Management: Regional block* and Tylenol PO (pre-op)*   Induction: Intravenous  PONV Risk Score and Plan: 4 or greater and Ondansetron, Dexamethasone, Propofol infusion, TIVA and Treatment may vary due to age or medical condition  Airway Management Planned: LMA  Additional Equipment:   Intra-op Plan:   Post-operative Plan: Extubation in OR  Informed Consent: I have reviewed the patients History and Physical, chart, labs and discussed the procedure including the risks, benefits and alternatives for the proposed anesthesia with the patient or authorized representative who has indicated his/her understanding and acceptance.     Dental advisory given  Plan Discussed with:   Anesthesia Plan Comments: (Discussed potential risks of nerve blocks including, but not limited to, infection, bleeding, nerve damage, seizures, pneumothorax, respiratory depression, and potential failure of the block. Alternatives to nerve blocks discussed. All questions answered.  Risks of general anesthesia discussed including, but not limited to, sore throat, hoarse voice, chipped/damaged teeth, injury to vocal cords, nausea and vomiting, allergic reactions, lung infection, heart attack, stroke, and death. All  questions answered.   PAT note written 06/21/2022 by Myra Gianotti, PA-C. For iSTAT day of surgery to recheck potassium.  )       Anesthesia Quick Evaluation

## 2022-06-21 NOTE — Progress Notes (Signed)
Anesthesia Chart Review:  Case: [id number] Date/Time: 06/22/22 1045   Procedures:      RIGHT SIMPLE MASTECTOMY (Right: Breast)     POSSIBLE RIGHT SENTINEL NODE BIOPSY (Right)   Anesthesia type: General   Pre-op diagnosis: RIGHT BREAST CANCER   Location: [facility] ROOM 02 / [facility]   Surgeons: Stark Klein, MD       DISCUSSION: Patient is an 82 year old female scheduled for the above procedure.   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  Last cardiology follow-up was on 11/28/21 with Fabian Sharp, [place]. Persistent afib on b-blocker and Eliquis, rate controlled. Dizziness improved after stopping amlodipine. Six month follow-up planned. On 06/07/22, cardiology advised she could hold Eliquis for 3 days prior to surgery. Last dose 06/18/22.   Potassium 2.9 with 06/21/22 labs. PAT RN to notify surgeon. Will order iSTAT for day of surgery to recheck. She is on KCL 40 mEq daily, as she is on Lasix 40 mg daily.    VS: BP 107/71   Pulse 71   Temp 36.6 C (Oral)   Resp 16   Ht '5\' 4"'$  (1.626 m)   Wt 61.8 kg   SpO2 97%   BMI 23.38 kg/m   PROVIDERS: Ngetich, Dinah, NP is PCP  Croitoru, Dani Gobble, MD is cardiologist Benay Pike, MD is HEM-ONC Gery Pray, MD is RAD-ONC   LABS: Preoperative labs noted. See DISCUSSION. (all labs ordered are listed, but only abnormal results are displayed)  Labs Reviewed  BASIC METABOLIC PANEL - Abnormal; Notable for the following components:      Result Value   Potassium 2.9 (*)    Creatinine, Ser 1.28 (*)    GFR, Estimated 42 (*)    All other components within normal limits  CBC - Abnormal; Notable for the following components:   Platelets 122 (*)    All other components within normal  limits    IMAGES: CT Soft tissue neck 06/13/22: IMPRESSION: 1. Negative for neck mass or lymphadenopathy. Marked area of clinical concern overlies the right external jugular vein, which is patent. No metastatic disease identified in the Neck.  2. Upper lobe lung scarring. Aortic Atherosclerosis (ICD10-I70.0).   CT Abd/pelvis 06/06/22: IMPRESSION: 1. No specific findings identified to suggest metastatic disease to the chest, abdomen or pelvis. 2. Right breast lesion is identified measuring 2.1 cm. 3. 4 mm nodule in the apical segment of the right upper lobe. This is nonspecific, but warrants attention on follow-up imaging. 4. Cystic lesion within the tail of pancreas is unchanged compared with 01/14/2021. This is favored to represent a benign abnormality such as a pseudocyst or side branch IPMN. According to consensus criteria follow-up imaging in 24 months with pancreas protocol MRCP or CT is advised. This recommendation follows ACR consensus guidelines: Managing Incidental Findings on Abdominal CT: White Paper of the ACR Incidental Findings Committee. J Am Coll Radiol 2010;7:754-773 5. Moderate to large hiatal hernia. 6.  Aortic Atherosclerosis (ICD10-I70.0).    EKG: 10/19/21: Afib at 71 bpm   CV: Heart monitor 10/25/21 - 11/08/21: The dominant rhythm is atrial fibrillation with mostly well controlled ventricular rates. Episodes of mild slow ventricular rates are seen at night and mild rapid ventricular response is seen during activity.   Persistent atrial fibrillation with well-controlled ventricular  rates.  Atrial Fibrillation occurred continuously (100% burden), ranging from 46-140 bpm (avg of 75 bpm). Isolated VEs were rare (<1.0%), VE Couplets were rare (<1.0%), and no VE Triplets were present.   Echo 09/30/19 (Limited): IMPRESSIONS   1. Left ventricular ejection fraction, by estimation, is 55 to 60%. The  left ventricle has normal function. The left ventricle has no  regional  wall motion abnormalities. Left ventricular diastolic function could not  be evaluated.   2. Right ventricular systolic function is normal. The right ventricular  size is normal. There is normal pulmonary artery systolic pressure. The  estimated right ventricular systolic pressure is 42.3 mmHg.   3. Left atrial size was severely dilated.   4. Right atrial size was moderately dilated.   5. The mitral valve is grossly normal. Mild to moderate mitral valve  regurgitation. No evidence of mitral stenosis.   6. Tricuspid valve regurgitation is mild to moderate.   7. The aortic valve is tricuspid. Aortic valve regurgitation is not  visualized. Mild to moderate aortic valve sclerosis/calcification is  present, without any evidence of aortic stenosis.   8. The inferior vena cava is normal in size with greater than 50%  respiratory variability, suggesting right atrial pressure of 3 mmHg.    Nuclear stress test 07/15/15: The left ventricular ejection fraction is normal (55-65%). Nuclear stress EF: 57%. There was no ST segment deviation noted during stress. The study is normal.  Normal stress nuclear study with no ischemia or infarction; EF 57 with normal wall motion.   Past Medical History:  Diagnosis Date   Anxiety    Atrial fibrillation with rapid ventricular response ([place]) 07/07/2015   Bilateral breast cancer (HCC)    Daily headache    Depression    GERD (gastroesophageal reflux disease)    Hyperlipidemia    Hypertension    Hypothyroidism    Iron deficiency anemia 2000s   Lumbago    Nausea alone    PONV (postoperative nausea and vomiting)    Senile osteoporosis    Squamous cell carcinoma of forehead    "I think they burned it off"    Past Surgical History:  Procedure Laterality Date   ABDOMINAL HYSTERECTOMY  1970s   BREAST BIOPSY Bilateral    BREAST BIOPSY Right 05/18/2022   [place] RT BREAST BX W LOC DEV 1ST LESION IMG BX SPEC US GUIDE 05/18/2022 [facility]    BREAST LUMPECTOMY Right 2002   "w/lymph nodes removed"   CARDIOVERSION N/A 08/19/2015   Procedure: CARDIOVERSION;  Surgeon: Sanda Klein, MD;  Location: [facility];  Service: Cardiovascular;  Laterality: N/A;   CATARACT EXTRACTION W/ INTRAOCULAR LENS  IMPLANT, BILATERAL Bilateral 2000s   DILATION AND CURETTAGE OF UTERUS  ~ [place] Left 2011   "has 2 screws in it"   EXCISIONAL HEMORRHOIDECTOMY  ~ [place]  02/12/2016   Right Wrist   HEMIARTHROPLASTY HIP Left 2011   "had ball replaced"   [place] Right 1980s?   MASTECTOMY Left 2005   ORIF WRIST FRACTURE Left 02/12/2016   Procedure: OPEN REDUCTION INTERNAL FIXATION LEFT DISTAL RADIUS;  Surgeon: Dayna Barker, MD;  Location: [facility];  Service: Plastics;  Laterality: Left;   TOTAL THYROIDECTOMY  1972   WRIST FRACTURE SURGERY Right    "has a plate and 7 screws in there"    MEDICATIONS:  acetaminophen (TYLENOL) 325 MG tablet   albuterol (VENTOLIN HFA) 108 (90 Base) MCG/ACT inhaler  ALPRAZolam (XANAX) 0.5 MG tablet   calcium citrate (CALCITRATE - DOSED IN MG ELEMENTAL CALCIUM) 950 (200 Ca) MG tablet   Cholecalciferol (VITAMIN D3) 2000 UNITS TABS   docusate sodium (COLACE) 100 MG capsule   ELIQUIS 5 MG TABS tablet   furosemide (LASIX) 40 MG tablet   gabapentin (NEURONTIN) 100 MG capsule   ibuprofen (ADVIL) 200 MG tablet   Iron, Ferrous Sulfate, 325 (65 Fe) MG TABS   levothyroxine (SYNTHROID) 137 MCG tablet   meclizine (ANTIVERT) 12.5 MG tablet   metoprolol tartrate (LOPRESSOR) 50 MG tablet   potassium chloride SA (KLOR-CON) 20 MEQ tablet   rosuvastatin (CRESTOR) 10 MG tablet   sertraline (ZOLOFT) 25 MG tablet   No current facility-administered medications for this encounter.    Myra Gianotti, PA-C Surgical Short Stay/Anesthesiology [facility] Phone [phone] [facility] Phone [phone] 06/21/2022 1:08 PM

## 2022-06-22 ENCOUNTER — Other Ambulatory Visit: Payer: Self-pay

## 2022-06-22 ENCOUNTER — Encounter (HOSPITAL_COMMUNITY)
Admission: RE | Disposition: A | Discharge status: Home / Self Care | Payer: Self-pay | Source: Home / Self Care | Attending: General Surgery

## 2022-06-22 ENCOUNTER — Encounter (HOSPITAL_COMMUNITY): Payer: Self-pay | Admitting: General Surgery

## 2022-06-22 ENCOUNTER — Ambulatory Visit (HOSPITAL_BASED_OUTPATIENT_CLINIC_OR_DEPARTMENT_OTHER): Payer: Medicare HMO | Admitting: Anesthesiology

## 2022-06-22 ENCOUNTER — Ambulatory Visit (HOSPITAL_COMMUNITY)
Admission: RE | Admit: 2022-06-22 | Discharge: 2022-06-23 | Disposition: A | Discharge status: Home / Self Care | Payer: Medicare HMO | Attending: General Surgery | Admitting: General Surgery

## 2022-06-22 ENCOUNTER — Ambulatory Visit (HOSPITAL_COMMUNITY)
Admission: RE | Admit: 2022-06-22 | Discharge: 2022-06-22 | Disposition: A | Discharge status: Home / Self Care | Payer: Medicare HMO | Source: Ambulatory Visit | Attending: General Surgery | Admitting: General Surgery

## 2022-06-22 ENCOUNTER — Ambulatory Visit (HOSPITAL_COMMUNITY): Payer: Medicare HMO | Admitting: Vascular Surgery

## 2022-06-22 DIAGNOSIS — E876 Hypokalemia: Secondary | ICD-10-CM

## 2022-06-22 DIAGNOSIS — I4891 Unspecified atrial fibrillation: Secondary | ICD-10-CM | POA: Diagnosis not present

## 2022-06-22 DIAGNOSIS — Z01818 Encounter for other preprocedural examination: Secondary | ICD-10-CM

## 2022-06-22 DIAGNOSIS — N189 Chronic kidney disease, unspecified: Secondary | ICD-10-CM | POA: Diagnosis not present

## 2022-06-22 DIAGNOSIS — M810 Age-related osteoporosis without current pathological fracture: Secondary | ICD-10-CM | POA: Diagnosis not present

## 2022-06-22 DIAGNOSIS — G8929 Other chronic pain: Secondary | ICD-10-CM | POA: Insufficient documentation

## 2022-06-22 DIAGNOSIS — F32A Depression, unspecified: Secondary | ICD-10-CM | POA: Insufficient documentation

## 2022-06-22 DIAGNOSIS — F419 Anxiety disorder, unspecified: Secondary | ICD-10-CM | POA: Diagnosis not present

## 2022-06-22 DIAGNOSIS — C50411 Malignant neoplasm of upper-outer quadrant of right female breast: Secondary | ICD-10-CM | POA: Diagnosis not present

## 2022-06-22 DIAGNOSIS — I4819 Other persistent atrial fibrillation: Secondary | ICD-10-CM | POA: Diagnosis not present

## 2022-06-22 DIAGNOSIS — E079 Disorder of thyroid, unspecified: Secondary | ICD-10-CM | POA: Insufficient documentation

## 2022-06-22 DIAGNOSIS — C50311 Malignant neoplasm of lower-inner quadrant of right female breast: Secondary | ICD-10-CM | POA: Diagnosis not present

## 2022-06-22 DIAGNOSIS — E7800 Pure hypercholesterolemia, unspecified: Secondary | ICD-10-CM | POA: Insufficient documentation

## 2022-06-22 DIAGNOSIS — I129 Hypertensive chronic kidney disease with stage 1 through stage 4 chronic kidney disease, or unspecified chronic kidney disease: Secondary | ICD-10-CM | POA: Diagnosis not present

## 2022-06-22 DIAGNOSIS — G8918 Other acute postprocedural pain: Secondary | ICD-10-CM | POA: Diagnosis not present

## 2022-06-22 DIAGNOSIS — Z7901 Long term (current) use of anticoagulants: Secondary | ICD-10-CM | POA: Insufficient documentation

## 2022-06-22 DIAGNOSIS — I071 Rheumatic tricuspid insufficiency: Secondary | ICD-10-CM | POA: Diagnosis not present

## 2022-06-22 DIAGNOSIS — Z170 Estrogen receptor positive status [ER+]: Secondary | ICD-10-CM | POA: Insufficient documentation

## 2022-06-22 DIAGNOSIS — Z79899 Other long term (current) drug therapy: Secondary | ICD-10-CM | POA: Insufficient documentation

## 2022-06-22 DIAGNOSIS — Z923 Personal history of irradiation: Secondary | ICD-10-CM | POA: Diagnosis not present

## 2022-06-22 DIAGNOSIS — F418 Other specified anxiety disorders: Secondary | ICD-10-CM | POA: Diagnosis not present

## 2022-06-22 DIAGNOSIS — N183 Chronic kidney disease, stage 3 unspecified: Secondary | ICD-10-CM | POA: Insufficient documentation

## 2022-06-22 DIAGNOSIS — Z79890 Hormone replacement therapy (postmenopausal): Secondary | ICD-10-CM | POA: Insufficient documentation

## 2022-06-22 DIAGNOSIS — E039 Hypothyroidism, unspecified: Secondary | ICD-10-CM | POA: Diagnosis not present

## 2022-06-22 DIAGNOSIS — Z9012 Acquired absence of left breast and nipple: Secondary | ICD-10-CM | POA: Diagnosis not present

## 2022-06-22 DIAGNOSIS — K219 Gastro-esophageal reflux disease without esophagitis: Secondary | ICD-10-CM | POA: Insufficient documentation

## 2022-06-22 DIAGNOSIS — C50312 Malignant neoplasm of lower-inner quadrant of left female breast: Secondary | ICD-10-CM | POA: Diagnosis not present

## 2022-06-22 DIAGNOSIS — C50911 Malignant neoplasm of unspecified site of right female breast: Secondary | ICD-10-CM | POA: Diagnosis not present

## 2022-06-22 HISTORY — PX: SIMPLE MASTECTOMY WITH AXILLARY SENTINEL NODE BIOPSY: SHX6098

## 2022-06-22 HISTORY — PX: MASTECTOMY: SHX3

## 2022-06-22 LAB — POCT I-STAT, CHEM 8
BUN: 16 mg/dL (ref 8–23)
Calcium, Ion: 1.08 mmol/L — ABNORMAL LOW (ref 1.15–1.40)
Chloride: 98 mmol/L (ref 98–111)
Creatinine, Ser: 1.4 mg/dL — ABNORMAL HIGH (ref 0.44–1.00)
Glucose, Bld: 93 mg/dL (ref 70–99)
HCT: 42 % (ref 36.0–46.0)
Hemoglobin: 14.3 g/dL (ref 12.0–15.0)
Potassium: 2.9 mmol/L — ABNORMAL LOW (ref 3.5–5.1)
Sodium: 142 mmol/L (ref 135–145)
TCO2: 30 mmol/L (ref 22–32)

## 2022-06-22 SURGERY — SIMPLE MASTECTOMY
Anesthesia: General | Site: Breast | Laterality: Right

## 2022-06-22 MED ORDER — LIDOCAINE-EPINEPHRINE 1 %-1:100000 IJ SOLN
INTRAMUSCULAR | Status: AC
  Filled 2022-06-22: qty 1

## 2022-06-22 MED ORDER — ACETAMINOPHEN 325 MG PO TABS
650.0000 mg | ORAL_TABLET | ORAL | Status: DC | PRN
  Administered 2022-06-22: 650 mg via ORAL
  Filled 2022-06-22: qty 2

## 2022-06-22 MED ORDER — TECHNETIUM TC 99M TILMANOCEPT KIT
1.0000 | PACK | Freq: Once | INTRAVENOUS | Status: AC | PRN
  Administered 2022-06-22: 1 via INTRADERMAL

## 2022-06-22 MED ORDER — GABAPENTIN 100 MG PO CAPS
100.0000 mg | ORAL_CAPSULE | Freq: Two times a day (BID) | ORAL | Status: DC
  Administered 2022-06-22 – 2022-06-23 (×2): 100 mg via ORAL
  Filled 2022-06-22 (×2): qty 1

## 2022-06-22 MED ORDER — CHLORHEXIDINE GLUCONATE CLOTH 2 % EX PADS: 6.0000 | MEDICATED_PAD | Freq: Once | CUTANEOUS | Status: DC

## 2022-06-22 MED ORDER — TRAMADOL HCL 50 MG PO TABS: 50.0000 mg | ORAL_TABLET | Freq: Four times a day (QID) | ORAL | Status: DC | PRN

## 2022-06-22 MED ORDER — FENTANYL CITRATE (PF) 100 MCG/2ML IJ SOLN
INTRAMUSCULAR | Status: AC
  Administered 2022-06-22: 50 ug via INTRAVENOUS
  Filled 2022-06-22: qty 2

## 2022-06-22 MED ORDER — PROPOFOL 500 MG/50ML IV EMUL
INTRAVENOUS | Status: DC | PRN
  Administered 2022-06-22: 75 ug/kg/min via INTRAVENOUS

## 2022-06-22 MED ORDER — MECLIZINE HCL 12.5 MG PO TABS: 12.5000 mg | ORAL_TABLET | Freq: Three times a day (TID) | ORAL | Status: DC | PRN

## 2022-06-22 MED ORDER — ORAL CARE MOUTH RINSE: 15.0000 mL | Freq: Once | OROMUCOSAL | Status: AC

## 2022-06-22 MED ORDER — PHENYLEPHRINE HCL-NACL 20-0.9 MG/250ML-% IV SOLN
INTRAVENOUS | Status: DC | PRN
  Administered 2022-06-22: 160 ug via INTRAVENOUS
  Administered 2022-06-22: 80 ug via INTRAVENOUS
  Administered 2022-06-22: 25 ug/min via INTRAVENOUS

## 2022-06-22 MED ORDER — PHENYLEPHRINE HCL (PRESSORS) 10 MG/ML IV SOLN
INTRAVENOUS | Status: AC
  Filled 2022-06-22: qty 1

## 2022-06-22 MED ORDER — CEFAZOLIN SODIUM-DEXTROSE 2-4 GM/100ML-% IV SOLN
2.0000 g | INTRAVENOUS | Status: AC
  Administered 2022-06-22: 2 g via INTRAVENOUS
  Filled 2022-06-22: qty 100

## 2022-06-22 MED ORDER — FENTANYL CITRATE (PF) 100 MCG/2ML IJ SOLN: 25.0000 ug | INTRAMUSCULAR | Status: DC | PRN

## 2022-06-22 MED ORDER — ACETAMINOPHEN 500 MG PO TABS
1000.0000 mg | ORAL_TABLET | ORAL | Status: AC
  Administered 2022-06-22: 1000 mg via ORAL
  Filled 2022-06-22: qty 2

## 2022-06-22 MED ORDER — ONDANSETRON 4 MG PO TBDP: 4.0000 mg | ORAL_TABLET | Freq: Four times a day (QID) | ORAL | Status: DC | PRN

## 2022-06-22 MED ORDER — FUROSEMIDE 40 MG PO TABS
40.0000 mg | ORAL_TABLET | Freq: Every day | ORAL | Status: DC
  Administered 2022-06-23: 40 mg via ORAL
  Filled 2022-06-22: qty 1

## 2022-06-22 MED ORDER — 0.9 % SODIUM CHLORIDE (POUR BTL) OPTIME
TOPICAL | Status: DC | PRN
  Administered 2022-06-22: 1000 mL

## 2022-06-22 MED ORDER — DIPHENHYDRAMINE HCL 12.5 MG/5ML PO ELIX: 12.5000 mg | ORAL_SOLUTION | Freq: Four times a day (QID) | ORAL | Status: DC | PRN

## 2022-06-22 MED ORDER — AMISULPRIDE (ANTIEMETIC) 5 MG/2ML IV SOLN: 10.0000 mg | Freq: Once | INTRAVENOUS | Status: DC | PRN

## 2022-06-22 MED ORDER — OXYCODONE HCL 5 MG PO TABS: 2.5000 mg | ORAL_TABLET | ORAL | Status: DC | PRN

## 2022-06-22 MED ORDER — FENTANYL CITRATE (PF) 250 MCG/5ML IJ SOLN
INTRAMUSCULAR | Status: DC | PRN
  Administered 2022-06-22: 50 ug via INTRAVENOUS

## 2022-06-22 MED ORDER — PROPOFOL 10 MG/ML IV BOLUS
INTRAVENOUS | Status: AC
  Filled 2022-06-22: qty 20

## 2022-06-22 MED ORDER — SERTRALINE HCL 50 MG PO TABS
50.0000 mg | ORAL_TABLET | Freq: Every day | ORAL | Status: DC
  Administered 2022-06-23: 50 mg via ORAL
  Filled 2022-06-22: qty 1

## 2022-06-22 MED ORDER — DIPHENHYDRAMINE HCL 50 MG/ML IJ SOLN: 12.5000 mg | Freq: Four times a day (QID) | INTRAMUSCULAR | Status: DC | PRN

## 2022-06-22 MED ORDER — LIDOCAINE 2% (20 MG/ML) 5 ML SYRINGE
INTRAMUSCULAR | Status: DC | PRN
  Administered 2022-06-22: 60 mg via INTRAVENOUS

## 2022-06-22 MED ORDER — OXYCODONE HCL 5 MG/5ML PO SOLN: 5.0000 mg | Freq: Once | ORAL | Status: DC | PRN

## 2022-06-22 MED ORDER — CHLORHEXIDINE GLUCONATE 0.12 % MT SOLN
15.0000 mL | Freq: Once | OROMUCOSAL | Status: AC
  Administered 2022-06-22: 15 mL via OROMUCOSAL
  Filled 2022-06-22: qty 15

## 2022-06-22 MED ORDER — DOCUSATE SODIUM 100 MG PO CAPS
100.0000 mg | ORAL_CAPSULE | Freq: Every day | ORAL | Status: DC
  Administered 2022-06-23: 100 mg via ORAL
  Filled 2022-06-22: qty 1

## 2022-06-22 MED ORDER — DEXAMETHASONE SODIUM PHOSPHATE 10 MG/ML IJ SOLN
INTRAMUSCULAR | Status: DC | PRN
  Administered 2022-06-22: 10 mg via INTRAVENOUS

## 2022-06-22 MED ORDER — LEVOTHYROXINE SODIUM 25 MCG PO TABS
137.0000 ug | ORAL_TABLET | Freq: Every day | ORAL | Status: DC
  Administered 2022-06-23: 137 ug via ORAL
  Filled 2022-06-22: qty 1

## 2022-06-22 MED ORDER — CEFAZOLIN SODIUM-DEXTROSE 2-4 GM/100ML-% IV SOLN
2.0000 g | Freq: Three times a day (TID) | INTRAVENOUS | Status: AC
  Administered 2022-06-22: 2 g via INTRAVENOUS
  Filled 2022-06-22: qty 100

## 2022-06-22 MED ORDER — KCL-LACTATED RINGERS-D5W 20 MEQ/L IV SOLN
INTRAVENOUS | Status: AC
  Filled 2022-06-22: qty 1000

## 2022-06-22 MED ORDER — BUPIVACAINE HCL (PF) 0.25 % IJ SOLN
INTRAMUSCULAR | Status: DC | PRN
  Administered 2022-06-22: 30 mL via PERINEURAL

## 2022-06-22 MED ORDER — FENTANYL CITRATE (PF) 250 MCG/5ML IJ SOLN
INTRAMUSCULAR | Status: AC
  Filled 2022-06-22: qty 5

## 2022-06-22 MED ORDER — PROPOFOL 10 MG/ML IV BOLUS
INTRAVENOUS | Status: DC | PRN
  Administered 2022-06-22: 100 mg via INTRAVENOUS
  Administered 2022-06-22: 20 mg via INTRAVENOUS
  Administered 2022-06-22: 50 mg via INTRAVENOUS
  Administered 2022-06-22: 30 mg via INTRAVENOUS

## 2022-06-22 MED ORDER — OXYCODONE HCL 5 MG PO TABS: 5.0000 mg | ORAL_TABLET | Freq: Once | ORAL | Status: DC | PRN

## 2022-06-22 MED ORDER — MIDAZOLAM HCL 2 MG/2ML IJ SOLN
INTRAMUSCULAR | Status: AC
  Filled 2022-06-22: qty 2

## 2022-06-22 MED ORDER — ALBUTEROL SULFATE (2.5 MG/3ML) 0.083% IN NEBU: 2.5000 mg | INHALATION_SOLUTION | Freq: Four times a day (QID) | RESPIRATORY_TRACT | Status: DC | PRN

## 2022-06-22 MED ORDER — CELECOXIB 100 MG PO CAPS
100.0000 mg | ORAL_CAPSULE | Freq: Two times a day (BID) | ORAL | Status: DC
  Administered 2022-06-22 – 2022-06-23 (×2): 100 mg via ORAL
  Filled 2022-06-22 (×4): qty 1

## 2022-06-22 MED ORDER — PROCHLORPERAZINE EDISYLATE 10 MG/2ML IJ SOLN: 5.0000 mg | Freq: Four times a day (QID) | INTRAMUSCULAR | Status: DC | PRN

## 2022-06-22 MED ORDER — BUPIVACAINE HCL (PF) 0.25 % IJ SOLN
INTRAMUSCULAR | Status: AC
  Filled 2022-06-22: qty 30

## 2022-06-22 MED ORDER — FENTANYL CITRATE PF 50 MCG/ML IJ SOSY
12.5000 ug | PREFILLED_SYRINGE | INTRAMUSCULAR | Status: DC | PRN
  Administered 2022-06-22: 12.5 ug via INTRAVENOUS
  Filled 2022-06-22: qty 1

## 2022-06-22 MED ORDER — POTASSIUM CHLORIDE CRYS ER 20 MEQ PO TBCR
40.0000 meq | EXTENDED_RELEASE_TABLET | Freq: Every day | ORAL | Status: DC
  Administered 2022-06-23: 40 meq via ORAL
  Filled 2022-06-22: qty 2

## 2022-06-22 MED ORDER — METOPROLOL TARTRATE 50 MG PO TABS
50.0000 mg | ORAL_TABLET | Freq: Two times a day (BID) | ORAL | Status: DC
  Administered 2022-06-22 – 2022-06-23 (×2): 50 mg via ORAL
  Filled 2022-06-22 (×2): qty 1

## 2022-06-22 MED ORDER — FENTANYL CITRATE (PF) 100 MCG/2ML IJ SOLN: 50.0000 ug | Freq: Once | INTRAMUSCULAR | Status: AC

## 2022-06-22 MED ORDER — HEMOSTATIC AGENTS (NO CHARGE) OPTIME
TOPICAL | Status: DC | PRN
  Administered 2022-06-22: 1 via TOPICAL

## 2022-06-22 MED ORDER — ONDANSETRON HCL 4 MG/2ML IJ SOLN
4.0000 mg | Freq: Four times a day (QID) | INTRAMUSCULAR | Status: DC | PRN
  Administered 2022-06-22: 4 mg via INTRAVENOUS
  Filled 2022-06-22: qty 2

## 2022-06-22 MED ORDER — ONDANSETRON HCL 4 MG/2ML IJ SOLN
INTRAMUSCULAR | Status: DC | PRN
  Administered 2022-06-22: 4 mg via INTRAVENOUS

## 2022-06-22 MED ORDER — MAGTRACE LYMPHATIC TRACER
INTRAMUSCULAR | Status: DC | PRN
  Administered 2022-06-22: 2 mL via INTRAMUSCULAR

## 2022-06-22 MED ORDER — PROCHLORPERAZINE MALEATE 10 MG PO TABS: 10.0000 mg | ORAL_TABLET | Freq: Four times a day (QID) | ORAL | Status: DC | PRN

## 2022-06-22 MED ORDER — ALPRAZOLAM 0.5 MG PO TABS
0.5000 mg | ORAL_TABLET | Freq: Every day | ORAL | Status: DC | PRN
  Administered 2022-06-22: 0.5 mg via ORAL
  Filled 2022-06-22: qty 1

## 2022-06-22 MED ORDER — LACTATED RINGERS IV SOLN: INTRAVENOUS | Status: DC

## 2022-06-22 SURGICAL SUPPLY — 48 items
ADH SKN CLS APL DERMABOND .7 (GAUZE/BANDAGES/DRESSINGS) ×2
AGENT HMST 10 BLLW SHRT CANN (HEMOSTASIS) ×2
APL PRP STRL LF DISP 70% ISPRP (MISCELLANEOUS) ×2
BAG COUNTER SPONGE SURGICOUNT (BAG) ×2 IMPLANT
BAG SPNG CNTER NS LX DISP (BAG) ×2
BINDER BREAST LRG (GAUZE/BANDAGES/DRESSINGS) ×1 IMPLANT
BINDER BREAST XLRG (GAUZE/BANDAGES/DRESSINGS) IMPLANT
BIOPATCH RED 1 DISK 7.0 (GAUZE/BANDAGES/DRESSINGS) ×1 IMPLANT
BNDG CMPR 5X4 CHSV STRCH STRL (GAUZE/BANDAGES/DRESSINGS) ×2
BNDG COHESIVE 4X5 TAN STRL LF (GAUZE/BANDAGES/DRESSINGS) ×1 IMPLANT
CANISTER SUCT 3000ML PPV (MISCELLANEOUS) ×3 IMPLANT
CHLORAPREP W/TINT 26 (MISCELLANEOUS) ×2 IMPLANT
CLIP TI MEDIUM 24 (CLIP) ×1 IMPLANT
COVER SURGICAL LIGHT HANDLE (MISCELLANEOUS) ×2 IMPLANT
DERMABOND ADVANCED .7 DNX12 (GAUZE/BANDAGES/DRESSINGS) ×2 IMPLANT
DRAIN CHANNEL 19F RND (DRAIN) ×2 IMPLANT
DRSG TEGADERM 4X4.75 (GAUZE/BANDAGES/DRESSINGS) ×1 IMPLANT
ELECT REM PT RETURN 9FT ADLT (ELECTROSURGICAL) ×2
ELECTRODE REM PT RTRN 9FT ADLT (ELECTROSURGICAL) ×2 IMPLANT
EVACUATOR SILICONE 100CC (DRAIN) ×2 IMPLANT
GAUZE PAD ABD 8X10 STRL (GAUZE/BANDAGES/DRESSINGS) ×2 IMPLANT
GAUZE SPONGE 4X4 12PLY STRL (GAUZE/BANDAGES/DRESSINGS) ×2 IMPLANT
GLOVE BIO SURGEON STRL SZ 6 (GLOVE) ×2 IMPLANT
GLOVE BIOGEL PI IND STRL 7.0 (GLOVE) ×1 IMPLANT
GLOVE INDICATOR 6.5 STRL GRN (GLOVE) ×2 IMPLANT
GOWN STRL REUS W/ TWL LRG LVL3 (GOWN DISPOSABLE) ×3 IMPLANT
GOWN STRL REUS W/TWL 2XL LVL3 (GOWN DISPOSABLE) ×2 IMPLANT
GOWN STRL REUS W/TWL LRG LVL3 (GOWN DISPOSABLE) ×4
HEMOSTAT HEMOBLAST BELLOWS (HEMOSTASIS) ×1 IMPLANT
ILLUMINATOR WAVEGUIDE N/F (MISCELLANEOUS) IMPLANT
KIT BASIN OR (CUSTOM PROCEDURE TRAY) ×2 IMPLANT
KIT TURNOVER KIT B (KITS) ×2 IMPLANT
LIGHT WAVEGUIDE WIDE FLAT (MISCELLANEOUS) IMPLANT
NS IRRIG 1000ML POUR BTL (IV SOLUTION) ×2 IMPLANT
PACK GENERAL/GYN (CUSTOM PROCEDURE TRAY) ×2 IMPLANT
PACK UNIVERSAL I (CUSTOM PROCEDURE TRAY) ×1 IMPLANT
PAD ARMBOARD 7.5X6 YLW CONV (MISCELLANEOUS) ×2 IMPLANT
PENCIL SMOKE EVACUATOR (MISCELLANEOUS) ×1 IMPLANT
STOCKINETTE IMPERVIOUS 9X36 MD (GAUZE/BANDAGES/DRESSINGS) ×1 IMPLANT
STRIP CLOSURE SKIN 1/2X4 (GAUZE/BANDAGES/DRESSINGS) ×2 IMPLANT
SUT ETHILON 2 0 FS 18 (SUTURE) ×2 IMPLANT
SUT MON AB 4-0 PC3 18 (SUTURE) ×2 IMPLANT
SUT SILK 2 0 (SUTURE) ×2
SUT SILK 2-0 18XBRD TIE 12 (SUTURE) ×1 IMPLANT
SUT VIC AB 3-0 SH 8-18 (SUTURE) ×3 IMPLANT
TOWEL GREEN STERILE (TOWEL DISPOSABLE) ×2 IMPLANT
TOWEL GREEN STERILE FF (TOWEL DISPOSABLE) ×2 IMPLANT
TRACER MAGTRACE VIAL (MISCELLANEOUS) ×1 IMPLANT

## 2022-06-22 NOTE — Anesthesia Procedure Notes (Addendum)
Procedure Name: LMA Insertion Date/Time: 06/22/2022 11:26 AM  Performed by: Betha Loa, CRNAPre-anesthesia Checklist: Patient identified, Emergency Drugs available, Suction available and Patient being monitored Patient Re-evaluated:Patient Re-evaluated prior to induction Oxygen Delivery Method: Circle System Utilized Preoxygenation: Pre-oxygenation with 100% oxygen Induction Type: IV induction Ventilation: Mask ventilation without difficulty LMA: LMA inserted LMA Size: 4.0 Number of attempts: 1 Airway Equipment and Method: Bite block Placement Confirmation: positive ETCO2 Tube secured with: Tape Dental Injury: Teeth and Oropharynx as per pre-operative assessment  Comments: Chaya Jan Paramedic Student instructed and assisted with placement of LMA 4.

## 2022-06-22 NOTE — Op Note (Signed)
Right Mastectomy  Indications: This patient presents with history of recurrent/new right breast cancer. She previously has had mastectomy on left for breast cancer, and lumpectomy/ALND/XRT. She presented with a new right sided palpable mass.  Pre-operative Diagnosis: right breast cancer, grade 3 invasive ductal carcinoma, cT2N0M0, lower inner quadrant, receptors +/+/-  Post-operative Diagnosis: same  Surgeon: Stark Klein   Assistant:  Pryor Curia, RNFA  Anesthesia: General endotracheal anesthesia and pectoral block  ASA Class: 3  Procedure Details  The patient was seen in the Holding Room. The risks, benefits, complications, treatment options, and expected outcomes were discussed with the patient. The possibilities of reaction to medication, pulmonary aspiration, bleeding, infection, the need for additional procedures, failure to diagnose a condition, and creating a complication requiring transfusion or operation were discussed with the patient. The patient concurred with the proposed plan, giving informed consent.  The site of surgery properly noted/marked. The patient was taken to Operating Room # 2, identified as Deborah Randall, and the procedure verified as right Mastectomy and possible Sentinel Node Biopsy. A Time Out was held and the above information confirmed.  The magtrace was injected in the subareolar location and massaged.    After induction of anesthesia, the right arm, breast, and chest were prepped and draped in standard fashion.  The borders of the breast were identified and marked.  The incision was drawn out to make sure incision lines were equidistant in length.    The superior incision was made with the #10 blade.  Mastectomy hooks were used to provide elevation of the skin edges, and the cautery was used to create the mastectomy flaps.  The dissection was taken to the fascia of the pectoralis major.  The penetrating vessels were clipped as needed.  The superior flap was taken  medially to the lateral sternal border, superiorly to the inferior border of the clavicle.  The inferior flap was similarly created, inferiorly to the inframammary fold and laterally to the border of the latissimus.  The breast was taken off including the pectoralis fascia and the axillary tail marked.    The axilla did not map as expected and no significant residual fatty/lymphatic tissue was found in the axilla.          The wound was irrigated.  Hemostasis was achieved with cautery.  Hemoblast was applied.  An area on the pectoralis that was oozy was suture ligated.  One 19 Blake drain was placed laterally and secured with a 2-0 nylon.    As the wound was pulled together to prepare to close, a nodule was found on the superior flap just above the incision.  This was cut out sharply and sent as additional 2 o'clock margin.    The wound was irrigated and closed with a 3-0 Vicryl deep dermal interrupted sutures and 4-0 Vicryl subcuticular closure in layers.    Sterile dressings were applied. At the end of the operation, all sponge, instrument, and needle counts were correct.  Findings: grossly clear surgical margins  Estimated Blood Loss: 50 mL          Drains: 19 Fr blake drain in right axilla                Specimens: right breast, additional superior skin margin 2 o'clock         Complications:  None; patient tolerated the procedure well.         Disposition: PACU - hemodynamically stable.         Condition: stable

## 2022-06-22 NOTE — Anesthesia Postprocedure Evaluation (Signed)
Anesthesia Post Note  Patient: Deborah Randall  Procedure(s) Performed: RIGHT SIMPLE MASTECTOMY (Right: Breast)     Patient location during evaluation: PACU Anesthesia Type: General Level of consciousness: awake Pain management: pain level controlled Vital Signs Assessment: post-procedure vital signs reviewed and stable Respiratory status: spontaneous breathing, nonlabored ventilation and respiratory function stable Cardiovascular status: blood pressure returned to baseline and stable Postop Assessment: no apparent nausea or vomiting Anesthetic complications: no   No notable events documented.  Last Vitals:  Vitals:   06/22/22 1345 06/22/22 1409  BP: 121/76 137/71  Pulse: 70 66  Resp: 15   Temp:  36.5 C  SpO2: 92% 98%    Last Pain:  Vitals:   06/22/22 1409  TempSrc: Oral  PainSc: 7                  Nilda Simmer

## 2022-06-22 NOTE — Progress Notes (Signed)
Dr. Cheral Bay made aware of patient's ISTAT result. Potassium 2.9. No new orders received.

## 2022-06-22 NOTE — Anesthesia Procedure Notes (Signed)
Procedure Name: LMA Insertion Date/Time: 06/22/2022 11:41 AM  Performed by: Betha Loa, CRNAPre-anesthesia Checklist: Patient identified, Emergency Drugs available, Suction available and Patient being monitored Patient Re-evaluated:Patient Re-evaluated prior to induction Oxygen Delivery Method: Circle System Utilized Preoxygenation: Pre-oxygenation with 100% oxygen Induction Type: IV induction Ventilation: Mask ventilation without difficulty LMA: LMA inserted LMA Size: 3.0 Number of attempts: 1 Airway Equipment and Method: Bite block Placement Confirmation: positive ETCO2 Tube secured with: Tape Dental Injury: Teeth and Oropharynx as per pre-operative assessment

## 2022-06-22 NOTE — Care Plan (Signed)
Pt received to room rrom PACU, she is A&O, breast binder noted. ABD CDI, JP noted with sangious fluid. VSS.  Pain is 7/10.  Will medicate for pain.  NADN. Sreece, RN

## 2022-06-22 NOTE — Anesthesia Procedure Notes (Addendum)
Anesthesia Regional Block: Pectoralis block   Pre-Anesthetic Checklist: , timeout performed,  Correct Patient, Correct Site, Correct Laterality,  Correct Procedure, Correct Position, site marked,  Risks and benefits discussed,  Surgical consent,  Pre-op evaluation,  At surgeon's request and post-op pain management  Laterality: Right  Prep: chloraprep       Needles:  Injection technique: Single-shot  Needle Type: Echogenic Stimulator Needle     Needle Length: 9cm  Needle Gauge: 21     Additional Needles:   Procedures:,,,, ultrasound used (permanent image in chart),,    Narrative:  Start time: 06/22/2022 10:10 AM End time: 06/22/2022 10:12 AM Injection made incrementally with aspirations every 5 mL.  Performed by: Personally  Anesthesiologist: Nilda Simmer, MD  Additional Notes: Discussed risks and benefits of nerve block including, but not limited to, prolonged and/or permanent nerve injury involving sensory and/or motor function. Monitors were applied and a time-out was performed. The nerve and associated structures were visualized under ultrasound guidance. After negative aspiration, local anesthetic was slowly injected around the nerve. There was no evidence of high pressure during the procedure. There were no paresthesias. VSS remained stable and the patient tolerated the procedure well.

## 2022-06-22 NOTE — Interval H&P Note (Signed)
History and Physical Interval Note:  06/22/2022 10:54 AM  Deborah Randall  has presented today for surgery, with the diagnosis of RIGHT BREAST CANCER.  The various methods of treatment have been discussed with the patient and family. After consideration of risks, benefits and other options for treatment, the patient has consented to  Procedure(s): RIGHT SIMPLE MASTECTOMY (Right) POSSIBLE RIGHT SENTINEL NODE BIOPSY (Right) as a surgical intervention.  The patient's history has been reviewed, patient examined, no change in status, stable for surgery.  I have reviewed the patient's chart and labs.  Questions were answered to the patient's satisfaction.     Stark Klein

## 2022-06-22 NOTE — Transfer of Care (Signed)
Immediate Anesthesia Transfer of Care Note  Patient: Deborah Randall  Procedure(s) Performed: RIGHT SIMPLE MASTECTOMY (Right: Breast)  Patient Location: PACU  Anesthesia Type:General  Level of Consciousness: patient cooperative and responds to stimulation  Airway & Oxygen Therapy: Patient Spontanous Breathing  Post-op Assessment: Report given to RN and Post -op Vital signs reviewed and stable  Post vital signs: Reviewed and stable  Last Vitals:  Vitals Value Taken Time  BP 110/71 06/22/22 1316  Temp    Pulse 82 06/22/22 1320  Resp 0 06/22/22 1320  Sats 96 06/22/22 1320  Vitals shown include unvalidated device data.  Last Pain:  Vitals:   06/22/22 1038  TempSrc:   PainSc: 0-No pain      Patients Stated Pain Goal: 0 (74/14/23 9532)  Complications: No notable events documented.

## 2022-06-22 NOTE — Discharge Instructions (Signed)
[facility] [phone]  MASTECTOMY: POST OP INSTRUCTIONS  Always review your discharge instruction sheet given to you by the facility where your surgery was performed. IF YOU HAVE DISABILITY OR FAMILY LEAVE FORMS, YOU MUST BRING THEM TO THE OFFICE FOR PROCESSING.   DO NOT GIVE THEM TO YOUR DOCTOR. A prescription for pain medication may be given to you upon discharge.  Take your pain medication as prescribed, if needed.  If narcotic pain medicine is not needed, then you may take acetaminophen (Tylenol) or ibuprofen (Advil) as needed. Take your usually prescribed medications unless otherwise directed. If you need a refill on your pain medication, please contact your pharmacy.  They will contact our office to request authorization.  Prescriptions will not be filled after 5pm or on week-ends. You should follow a light diet the first few days after arrival home, such as soup and crackers, etc.  Resume your normal diet the day after surgery. Most patients will experience some swelling and bruising on the chest and underarm.  Ice packs will help.  Swelling and bruising can take several days to resolve.  It is common to experience some constipation if taking pain medication after surgery.  Increasing fluid intake and taking a stool softener (such as Colace) will usually help or prevent this problem from occurring.  A mild laxative (Milk of Magnesia or Miralax) should be taken according to package instructions if there are no bowel movements after 48 hours. Unless discharge instructions indicate otherwise, leave your bandage dry and in place until your next appointment in 3-5 days.  You may take a limited sponge bath.  No tube baths or showers until the drains are removed.  You may have steri-strips (small skin tapes) in place directly over the incision.  These strips should be left on the skin for 7-10 days.  If your surgeon used skin glue on the incision, you may shower in 24 hours.   The glue will flake off over the next 2-3 weeks.  Any sutures or staples will be removed at the office during your follow-up visit. DRAINS:  If you have drains in place, it is important to keep a list of the amount of drainage produced each day in your drains.  Before leaving the hospital, you should be instructed on drain care.  Call our office if you have any questions about your drains. ACTIVITIES:  You may resume regular (light) daily activities beginning the next day--such as daily self-care, walking, climbing stairs--gradually increasing activities as tolerated.  You may have sexual intercourse when it is comfortable.  Refrain from any heavy lifting or straining until approved by your doctor. You may drive when you are no longer taking prescription pain medication, you can comfortably wear a seatbelt, and you can safely maneuver your car and apply brakes. RETURN TO WORK:  __________________________________________________________ You should see your doctor in the office for a follow-up appointment approximately 3-5 days after your surgery.  Your doctor's nurse will typically make your follow-up appointment when she calls you with your pathology report.  Expect your pathology report 2-3 business days after your surgery.  You may call to check if you do not hear from us after three days.   OTHER INSTRUCTIONS: ______________________________________________________________________________________________ ____________________________________________________________________________________________ WHEN TO CALL YOUR DOCTOR: Fever over 101.0 Nausea and/or vomiting Extreme swelling or bruising Continued bleeding from incision. Increased pain, redness, or drainage from the incision. The clinic staff is available to answer your questions during regular business hours.  Please don't hesitate   to call and ask to speak to one of the nurses for clinical concerns.  If you have a medical emergency, go to the  nearest emergency room or call 911.  A surgeon from [facility] is always on call at the hospital. [address], [address], [place], [place]  [address] ? [address] [address], , [place]   [address] [phone] ? [phone] ? FAX [phone] Web site: [email or web address]  

## 2022-06-23 ENCOUNTER — Encounter (HOSPITAL_COMMUNITY): Payer: Self-pay | Admitting: General Surgery

## 2022-06-23 DIAGNOSIS — C50311 Malignant neoplasm of lower-inner quadrant of right female breast: Secondary | ICD-10-CM | POA: Diagnosis not present

## 2022-06-23 DIAGNOSIS — I129 Hypertensive chronic kidney disease with stage 1 through stage 4 chronic kidney disease, or unspecified chronic kidney disease: Secondary | ICD-10-CM | POA: Diagnosis not present

## 2022-06-23 DIAGNOSIS — M810 Age-related osteoporosis without current pathological fracture: Secondary | ICD-10-CM | POA: Diagnosis not present

## 2022-06-23 DIAGNOSIS — Z7901 Long term (current) use of anticoagulants: Secondary | ICD-10-CM | POA: Diagnosis not present

## 2022-06-23 DIAGNOSIS — E876 Hypokalemia: Secondary | ICD-10-CM | POA: Diagnosis not present

## 2022-06-23 DIAGNOSIS — N183 Chronic kidney disease, stage 3 unspecified: Secondary | ICD-10-CM | POA: Diagnosis not present

## 2022-06-23 DIAGNOSIS — E7800 Pure hypercholesterolemia, unspecified: Secondary | ICD-10-CM | POA: Diagnosis not present

## 2022-06-23 DIAGNOSIS — I4819 Other persistent atrial fibrillation: Secondary | ICD-10-CM | POA: Diagnosis not present

## 2022-06-23 DIAGNOSIS — F419 Anxiety disorder, unspecified: Secondary | ICD-10-CM | POA: Diagnosis not present

## 2022-06-23 MED ORDER — POTASSIUM CHLORIDE CRYS ER 20 MEQ PO TBCR
40.0000 meq | EXTENDED_RELEASE_TABLET | Freq: Every day | ORAL | Status: DC
  Administered 2022-06-23: 40 meq via ORAL
  Filled 2022-06-23: qty 2

## 2022-06-23 MED ORDER — TRAMADOL HCL 50 MG PO TABS: 50.0000 mg | ORAL_TABLET | Freq: Four times a day (QID) | ORAL | 1 refills | Status: DC | PRN

## 2022-06-23 MED ORDER — GABAPENTIN 100 MG PO CAPS: 100.0000 mg | ORAL_CAPSULE | Freq: Two times a day (BID) | ORAL | 0 refills | Status: DC

## 2022-06-23 NOTE — Discharge Summary (Signed)
Physician Discharge Summary  Patient ID: Deborah Randall MRN: [id number] DOB/AGE: 1940-11-04 82 y.o.  Admit date: 06/22/2022 Discharge date: 06/23/2022  Admission Diagnoses: Breast cancer LIQ right breast Anxiety Persistent a fib Chronic anticoagulation CKD stage 3 Hypercholesterolemia Essential hypertension Osteoporosis.    Discharge Diagnoses:  Principal Problem:   Breast cancer of lower-inner quadrant of right female breast ([place]) hypokalemia  Discharged Condition: stable  Hospital Course:  Patient was admitted to the floor following right mastectomy 06/22/2022.  She had uneventful surgery.  She denied pain.  She was able to eat.  She had no n/v.  Drain output was low and serosanguinous.  No flap hematoma was seen on exam.  She did have some hypokalemia and additional oral K was given prior to d/c.  She received drain teaching.    Consults: None  Significant Diagnostic Studies: none new  Treatments: surgery: see above  Discharge Exam: Blood pressure 107/70, pulse 63, temperature 98.2 F (36.8 C), temperature source Oral, resp. rate 18, height '5\' 4"'$  (1.626 m), weight 61.2 kg, SpO2 96 %. General appearance: alert, cooperative, and no distress Resp: breathing comfortably Chest wall: mild tenderness as expected right side, no hematoma appreciated. Extremities: extremities normal, atraumatic, no cyanosis or edema  Disposition: Discharge disposition: 01-Home or Self Care       Discharge Instructions     Call MD for:  difficulty breathing, headache or visual disturbances   Complete by: As directed    Call MD for:  hives   Complete by: As directed    Call MD for:  persistant nausea and vomiting   Complete by: As directed    Call MD for:  redness, tenderness, or signs of infection (pain, swelling, redness, odor or green/yellow discharge around incision site)   Complete by: As directed    Call MD for:  severe uncontrolled pain   Complete by: As directed    Call MD for:   temperature >100.4   Complete by: As directed    Change dressing (specify)   Complete by: As directed    Measure and record drain output twice daily.  Bring record to clinic.   Diet - low sodium heart healthy   Complete by: As directed    Increase activity slowly   Complete by: As directed       Allergies as of 06/23/2022   No Known Allergies      Medication List     TAKE these medications    acetaminophen 325 MG tablet Commonly known as: TYLENOL Take 2 tablets (650 mg total) by mouth every 4 (four) hours as needed for headache or mild pain.   albuterol 108 (90 Base) MCG/ACT inhaler Commonly known as: VENTOLIN HFA Inhale 1 puff into the lungs every 6 (six) hours as needed for shortness of breath.   ALPRAZolam 0.5 MG tablet Commonly known as: XANAX TAKE 1 TABLET BY MOUTH DAILY FOR ANXIETY What changed:  how much to take how to take this when to take this reasons to take this additional instructions   calcium citrate 950 (200 Ca) MG tablet Commonly known as: CALCITRATE - dosed in mg elemental calcium Take 200 mg of elemental calcium by mouth daily.   docusate sodium 100 MG capsule Commonly known as: Colace Take 1 capsule (100 mg total) by mouth daily.   Eliquis 5 MG Tabs tablet Generic drug: apixaban TAKE 1 TABLET BY MOUTH TWICE DAILY   furosemide 40 MG tablet Commonly known as: LASIX TAKE 1 TABLET BY MOUTH  DAILY   gabapentin 100 MG capsule Commonly known as: NEURONTIN TAKE 1 CAPSULE BY MOUTH DAILY AT BEDTIME What changed: Another medication with the same name was added. Make sure you understand how and when to take each.   gabapentin 100 MG capsule Commonly known as: NEURONTIN Take 1 capsule (100 mg total) by mouth 2 (two) times daily. What changed: You were already taking a medication with the same name, and this prescription was added. Make sure you understand how and when to take each.   ibuprofen 200 MG tablet Commonly known as: ADVIL Take 200 mg  by mouth daily as needed for headache or mild pain.   Iron (Ferrous Sulfate) 325 (65 Fe) MG Tabs Take 325 mg by mouth daily.   levothyroxine 137 MCG tablet Commonly known as: SYNTHROID Take 1 tablet (137 mcg total) by mouth daily.   meclizine 12.5 MG tablet Commonly known as: ANTIVERT Take 12.5 mg by mouth as needed for dizziness.   metoprolol tartrate 50 MG tablet Commonly known as: LOPRESSOR TAKE 1 TABLET BY MOUTH TWICE DAILY   potassium chloride SA 20 MEQ tablet Commonly known as: KLOR-CON M Take 2 tablets (40 mEq total) by mouth daily.   rosuvastatin 10 MG tablet Commonly known as: CRESTOR TAKE 1 TABLET BY MOUTH DAILY   sertraline 25 MG tablet Commonly known as: ZOLOFT TAKE 2 TABLETS BY MOUTH DAILY   traMADol 50 MG tablet Commonly known as: ULTRAM Take 1 tablet (50 mg total) by mouth every 6 (six) hours as needed (mild pain).   Vitamin D3 50 MCG (2000 UT) Tabs Take 2,000 Units by mouth daily.               Discharge Care Instructions  (From admission, onward)           Start     Ordered   06/23/22 0000  Change dressing (specify)       Comments: Measure and record drain output twice daily.  Bring record to clinic.   06/23/22 0906            Follow-up Information     Stark Klein, MD Follow up in 2 week(s).   Specialty: General Surgery Contact information: [address] [place] [address] [phone]                 Signed: Stark Klein 06/23/2022, 10:20 AM

## 2022-06-26 LAB — SURGICAL PATHOLOGY

## 2022-06-29 ENCOUNTER — Encounter: Payer: Self-pay | Admitting: *Deleted

## 2022-07-03 ENCOUNTER — Ambulatory Visit: Payer: Medicare HMO | Admitting: Family

## 2022-07-06 ENCOUNTER — Inpatient Hospital Stay: Payer: Medicare HMO | Attending: Hematology and Oncology | Admitting: Hematology and Oncology

## 2022-07-06 ENCOUNTER — Other Ambulatory Visit: Payer: Self-pay

## 2022-07-06 VITALS — BP 106/70 | HR 83 | Temp 98.1°F | Resp 16 | Ht 64.0 in | Wt 135.6 lb

## 2022-07-06 DIAGNOSIS — Z8041 Family history of malignant neoplasm of ovary: Secondary | ICD-10-CM | POA: Diagnosis not present

## 2022-07-06 DIAGNOSIS — Z9012 Acquired absence of left breast and nipple: Secondary | ICD-10-CM | POA: Insufficient documentation

## 2022-07-06 DIAGNOSIS — Z170 Estrogen receptor positive status [ER+]: Secondary | ICD-10-CM | POA: Diagnosis not present

## 2022-07-06 DIAGNOSIS — Z8052 Family history of malignant neoplasm of bladder: Secondary | ICD-10-CM | POA: Insufficient documentation

## 2022-07-06 DIAGNOSIS — C50411 Malignant neoplasm of upper-outer quadrant of right female breast: Secondary | ICD-10-CM | POA: Diagnosis not present

## 2022-07-06 DIAGNOSIS — I10 Essential (primary) hypertension: Secondary | ICD-10-CM | POA: Diagnosis not present

## 2022-07-06 DIAGNOSIS — Z923 Personal history of irradiation: Secondary | ICD-10-CM | POA: Diagnosis not present

## 2022-07-06 DIAGNOSIS — Z801 Family history of malignant neoplasm of trachea, bronchus and lung: Secondary | ICD-10-CM | POA: Diagnosis not present

## 2022-07-06 DIAGNOSIS — Z90710 Acquired absence of both cervix and uterus: Secondary | ICD-10-CM | POA: Diagnosis not present

## 2022-07-06 MED ORDER — TAMOXIFEN CITRATE 20 MG PO TABS: 20.0000 mg | ORAL_TABLET | Freq: Every day | ORAL | 3 refills | Status: DC

## 2022-07-06 NOTE — Assessment & Plan Note (Signed)
This is a very pleasant 82 yr female patient with newly diagnosed right breast 12 0 clock mass, pathology showed grade 3 IDC, ER Positive PR positive strong staining, Her 2 negative referred to medical oncology for recommendations. She had prior history of right breast invasive mammary carcinoma, apocrine subtype measuring 2.5 mm had prior right lumpectomy followed by adjuvant radiation, did not take antiestrogen therapy for more than several months.  She could not afford it.  She also had left breast mastectomy in 2005, pathology showed DCIS.  She did not take any radiation or antiestrogen therapy following this. During her initial visit, she refused Oncotype testing and does not want to consider chemotherapy even if this was recommended.  She is now status post mastectomy final pathology showed high-grade 3.2 cm invasive ductal carcinoma, negative margins, prognostic showed ER 100% PR 5% and HER2 negative.  She is here to talk once again about antiestrogen therapy.  She once again is firm with her decision that she would not want to consider any chemotherapy or radiation.  She will however proceed with antiestrogen therapy.  Previously we have discussed about tamoxifen versus aromatase inhibitors and felt tamoxifen may be a better choice for her since she is already on blood thinners for atrial fibrillation, had prior hysterectomy and also had osteoporosis.  She is also decently active for her age.  She feels that tamoxifen will be the right choice for her.  This has been dispensed to the pharmacy of her choice.  She will start this around mid March once healing is complete. She will return to clinic in 3 months for survivorship visit or sooner as needed.  I have sent an in basket message to Dr. Barry Dienes requesting if she can be seen sooner for drain removal.

## 2022-07-06 NOTE — Progress Notes (Signed)
[facility] NOTE  Patient Care Team: Ngetich, Nelda Bucks, NP as PCP - General (Family Medicine) Croitoru, Dani Gobble, MD as PCP - Cardiology (Cardiology) Croitoru, Dani Gobble, MD as Consulting Physician (Cardiology) Martinique, Amy, MD as Consulting Physician (Dermatology) [place] Jebel, Tami Lin, [place] as Physician Assistant (Cardiology) Mauro Kaufmann, RN as Oncology Nurse Navigator Rockwell Germany, RN as Oncology Nurse Navigator Benay Pike, MD as Consulting Physician (Hematology and Oncology)  CHIEF COMPLAINTS/PURPOSE OF CONSULTATION:  Newly diagnosed breast cancer  HISTORY OF PRESENTING ILLNESS:  Deborah Randall 82 y.o. female is here because of recent diagnosis of right breast cancer.  I reviewed her records extensively and collaborated the history with the patient.  SUMMARY OF ONCOLOGIC HISTORY: Oncology History  Malignant neoplasm of upper-outer quadrant of right breast in female, estrogen receptor positive ([place])  05/18/2022 Pathology Results   Invasive poorly differentiated ductal adenocarcinoma, grade 3. ER PR 100% strongly positive, Her 2 negative.   06/08/2022 Initial Diagnosis   Malignant neoplasm of upper-outer quadrant of right breast in female, estrogen receptor positive ([place])   06/08/2022 Cancer Staging   Staging form: Breast, AJCC 8th Edition - Clinical stage from 06/08/2022: Stage IIA (cT2, cN0, cM0, G3, ER+, PR+, HER2-) - Signed by Marlynn Perking, PA-C on 06/08/2022 Stage prefix: Initial diagnosis Histologic grading system: 3 grade system    She is currently scheduled for right simple mastectomy on 06/22/2022. She had breast cancer twice. She had right invasive apocrine mammary carcinoma measuring 2.5 mm grade 2 or 3 back in 2002 underwent right breast lumpectomy, she also had DCIS high-grade.  Postlumpectomy she underwent radiation.  She tried 1 of antiestrogens for about a year but it was too expensive for her so she stopped it.  She could not remember the exact  medication but thinks it could be tamoxifen.  Later in 2005, she underwent left breast mastectomy which once again showed high-grade DCIS with necrosis and calcifications, margins negative.  No evidence of Paget's disease of the nipple.  Since her last visit here she had right mastectomy which showed a 3.2 cm grade 3 IDC, margins negative, prognostic is ER 100% positive PR 5% positive HER2 negative.  She is recovering well from the mastectomy.  She did not have to use any pain medication.  She has a postop appointment on March 1 with Dr. Barry Dienes however she was wondering if she can have the drain removed sooner since it is barely draining any.  Rest of the pertinent 10 point ROS reviewed and negative  MEDICAL HISTORY:  Past Medical History:  Diagnosis Date   Anxiety    Atrial fibrillation with rapid ventricular response ([place]) 07/07/2015   Bilateral breast cancer ([facility])    Daily headache    Depression    GERD (gastroesophageal reflux disease)    Hyperlipidemia    Hypertension    Hypothyroidism    Iron deficiency anemia 2000s   Lumbago    Nausea alone    PONV (postoperative nausea and vomiting)    Senile osteoporosis    Squamous cell carcinoma of forehead    "I think they burned it off"    SURGICAL HISTORY: Past Surgical History:  Procedure Laterality Date   ABDOMINAL HYSTERECTOMY  1970s   BREAST BIOPSY Bilateral    BREAST BIOPSY Right 05/18/2022   [place] RT BREAST BX W LOC DEV 1ST LESION IMG BX SPEC US GUIDE 05/18/2022 [facility] MAMMOGRAPHY   BREAST LUMPECTOMY Right 2002   "w/lymph nodes removed"   CARDIOVERSION  N/A 08/19/2015   Procedure: CARDIOVERSION;  Surgeon: Sanda Klein, MD;  Location: [facility];  Service: Cardiovascular;  Laterality: N/A;   CATARACT EXTRACTION W/ INTRAOCULAR LENS  IMPLANT, BILATERAL Bilateral 2000s   DILATION AND CURETTAGE OF UTERUS  ~ [place] Left 2011   "has 2 screws in it"   EXCISIONAL HEMORRHOIDECTOMY  ~ [place]   02/12/2016   Right Wrist   HEMIARTHROPLASTY HIP Left 2011   "had ball replaced"   [place] Right 1980s?   MASTECTOMY Left 2005   ORIF WRIST FRACTURE Left 02/12/2016   Procedure: OPEN REDUCTION INTERNAL FIXATION LEFT DISTAL RADIUS;  Surgeon: Dayna Barker, MD;  Location: [facility];  Service: Plastics;  Laterality: Left;   SIMPLE MASTECTOMY WITH AXILLARY SENTINEL NODE BIOPSY Right 06/22/2022   Procedure: RIGHT SIMPLE MASTECTOMY;  Surgeon: Stark Klein, MD;  Location: [facility];  Service: General;  Laterality: Right;   TOTAL THYROIDECTOMY  1972   WRIST FRACTURE SURGERY Right    "has a plate and 7 screws in there"    SOCIAL HISTORY: 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   Tobacco Use   Smoking status: Never   Smokeless tobacco: Never  Vaping Use   Vaping Use: Never used  Substance and Sexual Activity   Alcohol use: No   Drug use: No   Sexual activity: Never  Other Topics Concern   Not on file  Social History Narrative   Lives alone   Widow   2 sons , one son decease, one son disable    Works at [employer] in [employer]   Enjoys going places with friends   Never smoked   Alcohol none   Exercise -walk, works in [place] Strain: [place]  (05/16/2017)   Overall Financial Resource Strain (CARDIA)    Difficulty of Paying Living Expenses: Not hard at all  Food Insecurity: No [place] (06/20/2022)   Hunger Vital Sign    Worried About Running Out of Food in the Last Year: Never true    [place] in the Last Year: Never true  Transportation Needs: No Transportation Needs (06/20/2022)   PRAPARE - Hydrologist (Medical): No    Lack of  Transportation (Non-Medical): No  Physical Activity: Inactive (05/16/2017)   Exercise Vital Sign    Days of Exercise per Week: 0 days    Minutes of Exercise per Session: 0 min  Stress: No Stress Concern Present (05/16/2017)   [place]    Feeling of Stress : Not at all  Social Connections: Moderately Integrated (05/16/2017)   Social Connection and Isolation Panel [NHANES]    Frequency of Communication with Friends and Family: More than three times a week    Frequency of Social Gatherings with Friends and Family: Three times a week    Attends Religious Services: 1 to 4 times per year    Active Member of Clubs or Organizations: Yes    Attends Archivist Meetings: Never    Marital Status: Widowed  Intimate Partner Violence: Not At Risk (  05/16/2017)   Humiliation, Afraid, Rape, and Kick questionnaire    Fear of Current or Ex-Partner: No    Emotionally Abused: No    Physically Abused: No    Sexually Abused: No    FAMILY HISTORY: Family History  Problem Relation Age of Onset   Cancer Father    Lung cancer Sister    Heart disease Sister    Hypertension Sister    Ovarian cancer Sister    Heart attack Brother    Other Brother    Skin cancer Brother    Other Brother    Other Brother    Alzheimer's disease Brother    Bladder Cancer Brother    Diabetes Brother    COPD Brother    Mental illness Son    Mental illness Son     ALLERGIES:  has No Known Allergies.  MEDICATIONS:  Current Outpatient Medications  Medication Sig Dispense Refill   [START ON 07/28/2022] tamoxifen (NOLVADEX) 20 MG tablet Take 1 tablet (20 mg total) by mouth daily. 90 tablet 3   acetaminophen (TYLENOL) 325 MG tablet Take 2 tablets (650 mg total) by mouth every 4 (four) hours as needed for headache or mild pain.     albuterol (VENTOLIN HFA) 108 (90 Base) MCG/ACT inhaler Inhale 1 puff into the lungs every 6 (six) hours as needed for  shortness of breath.     ALPRAZolam (XANAX) 0.5 MG tablet TAKE 1 TABLET BY MOUTH DAILY FOR ANXIETY (Patient taking differently: Take 0.5 mg by mouth daily as needed for anxiety.) 30 tablet 5   calcium citrate (CALCITRATE - DOSED IN MG ELEMENTAL CALCIUM) 950 (200 Ca) MG tablet Take 200 mg of elemental calcium by mouth daily.     Cholecalciferol (VITAMIN D3) 2000 UNITS TABS Take 2,000 Units by mouth daily.     docusate sodium (COLACE) 100 MG capsule Take 1 capsule (100 mg total) by mouth daily. 10 capsule 0   ELIQUIS 5 MG TABS tablet TAKE 1 TABLET BY MOUTH TWICE DAILY 180 tablet 1   furosemide (LASIX) 40 MG tablet TAKE 1 TABLET BY MOUTH DAILY 90 tablet 1   gabapentin (NEURONTIN) 100 MG capsule TAKE 1 CAPSULE BY MOUTH DAILY AT BEDTIME 90 capsule 1   gabapentin (NEURONTIN) 100 MG capsule Take 1 capsule (100 mg total) by mouth 2 (two) times daily. 60 capsule 0   ibuprofen (ADVIL) 200 MG tablet Take 200 mg by mouth daily as needed for headache or mild pain.     Iron, Ferrous Sulfate, 325 (65 Fe) MG TABS Take 325 mg by mouth daily. 30 tablet    levothyroxine (SYNTHROID) 137 MCG tablet Take 1 tablet (137 mcg total) by mouth daily. 60 tablet 1   meclizine (ANTIVERT) 12.5 MG tablet Take 12.5 mg by mouth as needed for dizziness.     metoprolol tartrate (LOPRESSOR) 50 MG tablet TAKE 1 TABLET BY MOUTH TWICE DAILY 180 tablet 3   potassium chloride SA (KLOR-CON) 20 MEQ tablet Take 2 tablets (40 mEq total) by mouth daily. 180 tablet 3   rosuvastatin (CRESTOR) 10 MG tablet TAKE 1 TABLET BY MOUTH DAILY 90 tablet 2   sertraline (ZOLOFT) 25 MG tablet TAKE 2 TABLETS BY MOUTH DAILY 180 tablet 1   traMADol (ULTRAM) 50 MG tablet Take 1 tablet (50 mg total) by mouth every 6 (six) hours as needed (mild pain). 10 tablet 1   No current facility-administered medications for this visit.    REVIEW OF SYSTEMS:   Constitutional: Denies fevers, chills  or abnormal night sweats Eyes: Denies blurriness of vision, double vision  or watery eyes Ears, nose, mouth, throat, and face: Denies mucositis or sore throat Respiratory: Denies cough, dyspnea or wheezes Cardiovascular: Denies palpitation, chest discomfort or lower extremity swelling Gastrointestinal:  Denies nausea, heartburn or change in bowel habits Skin: Denies abnormal skin rashes Lymphatics: Denies new lymphadenopathy or easy bruising Neurological:Denies numbness, tingling or new weaknesses Behavioral/Psych: Mood is stable, no new changes  Breast: Denies any palpable lumps or discharge All other systems were reviewed with the patient and are negative.  PHYSICAL EXAMINATION: ECOG PERFORMANCE STATUS: 0 - Asymptomatic  Vitals:   07/06/22 0949  BP: 106/70  Pulse: 83  Resp: 16  Temp: 98.1 F (36.7 C)  SpO2: 98%    Filed Weights   07/06/22 0949  Weight: 135 lb 9.6 oz (61.5 kg)     GENERAL:alert, no distress and comfortable Breast: Drain in place,  LABORATORY DATA:  I have reviewed the data as listed Lab Results  Component Value Date   WBC 7.4 06/21/2022   HGB 14.3 06/22/2022   HCT 42.0 06/22/2022   MCV 94.0 06/21/2022   PLT 122 (L) 06/21/2022   Lab Results  Component Value Date   NA 142 06/22/2022   K 2.9 (L) 06/22/2022   CL 98 06/22/2022   CO2 28 06/21/2022    RADIOGRAPHIC STUDIES: I have personally reviewed the radiological reports and agreed with the findings in the report.  ASSESSMENT AND PLAN:  Malignant neoplasm of upper-outer quadrant of right breast in female, estrogen receptor positive ([place]) This is a very pleasant 82 yr female patient with newly diagnosed right breast 12 0 clock mass, pathology showed grade 3 IDC, ER Positive PR positive strong staining, Her 2 negative referred to medical oncology for recommendations. She had prior history of right breast invasive mammary carcinoma, apocrine subtype measuring 2.5 mm had prior right lumpectomy followed by adjuvant radiation, did not take antiestrogen therapy for more than  several months.  She could not afford it.  She also had left breast mastectomy in 2005, pathology showed DCIS.  She did not take any radiation or antiestrogen therapy following this. During her initial visit, she refused Oncotype testing and does not want to consider chemotherapy even if this was recommended.  She is now status post mastectomy final pathology showed high-grade 3.2 cm invasive ductal carcinoma, negative margins, prognostic showed ER 100% PR 5% and HER2 negative.  She is here to talk once again about antiestrogen therapy.  She once again is firm with her decision that she would not want to consider any chemotherapy or radiation.  She will however proceed with antiestrogen therapy.  Previously we have discussed about tamoxifen versus aromatase inhibitors and felt tamoxifen may be a better choice for her since she is already on blood thinners for atrial fibrillation, had prior hysterectomy and also had osteoporosis.  She is also decently active for her age.  She feels that tamoxifen will be the right choice for her.  This has been dispensed to the pharmacy of her choice.  She will start this around mid March once healing is complete. She will return to clinic in 3 months for survivorship visit or sooner as needed.  I have sent an in basket message to Dr. Barry Dienes requesting if she can be seen sooner for drain removal.  Total time spent: 30 minutes including history, physical exam, review of records, counseling and coordination of care All questions were answered. The patient knows to  call the clinic with any problems, questions or concerns.    Benay Pike, MD 07/06/22

## 2022-07-11 ENCOUNTER — Other Ambulatory Visit: Payer: Self-pay | Admitting: *Deleted

## 2022-07-11 DIAGNOSIS — E876 Hypokalemia: Secondary | ICD-10-CM

## 2022-07-11 MED ORDER — POTASSIUM CHLORIDE CRYS ER 20 MEQ PO TBCR: 40.0000 meq | EXTENDED_RELEASE_TABLET | Freq: Every day | ORAL | 1 refills | Status: DC

## 2022-07-11 NOTE — Telephone Encounter (Signed)
Pharmacy requested refill.  Pended Rx and sent to [facility] for approval due to Webb Silversmith out of office.

## 2022-07-12 ENCOUNTER — Encounter: Payer: Self-pay | Admitting: *Deleted

## 2022-07-12 DIAGNOSIS — C50411 Malignant neoplasm of upper-outer quadrant of right female breast: Secondary | ICD-10-CM

## 2022-07-28 ENCOUNTER — Telehealth: Payer: Self-pay | Admitting: Genetic Counselor

## 2022-07-28 NOTE — Telephone Encounter (Signed)
Patient LVM requesitng to cancel her genetics appt.

## 2022-07-31 ENCOUNTER — Inpatient Hospital Stay: Payer: Medicare HMO | Admitting: Genetic Counselor

## 2022-07-31 ENCOUNTER — Inpatient Hospital Stay: Payer: Medicare HMO

## 2022-08-15 ENCOUNTER — Other Ambulatory Visit: Payer: Self-pay | Admitting: Cardiovascular Disease

## 2022-08-15 DIAGNOSIS — I4819 Other persistent atrial fibrillation: Secondary | ICD-10-CM

## 2022-08-15 NOTE — Telephone Encounter (Signed)
Prescription refill request for Eliquis received. Indication: Afib  Last office visit: 11/28/21 ([facility])  Scr: 1.28 (06/21/22)  Age: 82 Weight: 61.5kg  Appropriate dose. Refill sent.

## 2022-08-29 ENCOUNTER — Other Ambulatory Visit: Payer: Self-pay | Admitting: Family

## 2022-08-29 DIAGNOSIS — R0609 Other forms of dyspnea: Secondary | ICD-10-CM

## 2022-08-29 DIAGNOSIS — I509 Heart failure, unspecified: Secondary | ICD-10-CM

## 2022-08-29 DIAGNOSIS — E039 Hypothyroidism, unspecified: Secondary | ICD-10-CM

## 2022-09-05 ENCOUNTER — Ambulatory Visit (INDEPENDENT_AMBULATORY_CARE_PROVIDER_SITE_OTHER): Payer: Medicare PPO | Admitting: Family

## 2022-09-05 ENCOUNTER — Encounter: Payer: Self-pay | Admitting: Family

## 2022-09-05 VITALS — BP 104/69 | HR 83 | Temp 97.7°F | Resp 14 | Ht 62.21 in | Wt 133.2 lb

## 2022-09-05 DIAGNOSIS — Z0000 Encounter for general adult medical examination without abnormal findings: Secondary | ICD-10-CM

## 2022-09-05 NOTE — Patient Instructions (Signed)
Deborah Randall , Thank you for taking time to come for your Medicare Wellness Visit. I appreciate your ongoing commitment to your health goals. Please review the following plan we discussed and let me know if I can assist you in the future.   Screening recommendations/referrals: Colonoscopy : N/A  Mammogram : Up to date  Bone Density  : Up to date  Recommended yearly ophthalmology/optometry visit for glaucoma screening and checkup Recommended yearly dental visit for hygiene and checkup  Vaccinations: Influenza vaccine- due annually in September/October Pneumococcal vaccine  : Up to date  Tdap vaccine  : Up to date  Shingles vaccine  : Up to date     Advanced directives: yes  Conditions/risks identified:  advanced age (>37men, >90 women);hypertension;sedentary lifestyle  Next appointment: 1 year    Preventive Care 83 Years and Older, Female Preventive care refers to lifestyle choices and visits with your health care provider that can promote health and wellness. What does preventive care include? A yearly physical exam. This is also called an annual well check. Dental exams once or twice a year. Routine eye exams. Ask your health care provider how often you should have your eyes checked. Personal lifestyle choices, including: Daily care of your teeth and gums. Regular physical activity. Eating a healthy diet. Avoiding tobacco and drug use. Limiting alcohol use. Practicing safe sex. Taking low-dose aspirin every day. Taking vitamin and mineral supplements as recommended by your health care provider. What happens during an annual well check? The services and screenings done by your health care provider during your annual well check will depend on your age, overall health, lifestyle risk factors, and family history of disease. Counseling  Your health care provider may ask you questions about your: Alcohol use. Tobacco use. Drug use. Emotional well-being. Home and relationship  well-being. Sexual activity. Eating habits. History of falls. Memory and ability to understand (cognition). Work and work Astronomer. Reproductive health. Screening  You may have the following tests or measurements: Height, weight, and BMI. Blood pressure. Lipid and cholesterol levels. These may be checked every 5 years, or more frequently if you are over 34 years old. Skin check. Lung cancer screening. You may have this screening every year starting at age 83 if you have a 30-pack-year history of smoking and currently smoke or have quit within the past 15 years. Fecal occult blood test (FOBT) of the stool. You may have this test every year starting at age 45. Flexible sigmoidoscopy or colonoscopy. You may have a sigmoidoscopy every 5 years or a colonoscopy every 10 years starting at age 54. Hepatitis C blood test. Hepatitis B blood test. Sexually transmitted disease (STD) testing. Diabetes screening. This is done by checking your blood sugar (glucose) after you have not eaten for a while (fasting). You may have this done every 1-3 years. Bone density scan. This is done to screen for osteoporosis. You may have this done starting at age 52. Mammogram. This may be done every 1-2 years. Talk to your health care provider about how often you should have regular mammograms. Talk with your health care provider about your test results, treatment options, and if necessary, the need for more tests. Vaccines  Your health care provider may recommend certain vaccines, such as: Influenza vaccine. This is recommended every year. Tetanus, diphtheria, and acellular pertussis (Tdap, Td) vaccine. You may need a Td booster every 10 years. Zoster vaccine. You may need this after age 40. Pneumococcal 13-valent conjugate (PCV13) vaccine. One dose is recommended  after age 83. Pneumococcal polysaccharide (PPSV23) vaccine. One dose is recommended after age 52. Talk to your health care provider about which  screenings and vaccines you need and how often you need them. This information is not intended to replace advice given to you by your health care provider. Make sure you discuss any questions you have with your health care provider. Document Released: 06/04/2015 Document Revised: 01/26/2016 Document Reviewed: 03/09/2015 Elsevier Interactive Patient Education  2017 [place] Prevention in the Home Falls can cause injuries. They can happen to people of all ages. There are many things you can do to make your home safe and to help prevent falls. What can I do on the outside of my home? Regularly fix the edges of walkways and driveways and fix any cracks. Remove anything that might make you trip as you walk through a door, such as a raised step or threshold. Trim any bushes or trees on the path to your home. Use bright outdoor lighting. Clear any walking paths of anything that might make someone trip, such as rocks or tools. Regularly check to see if handrails are loose or broken. Make sure that both sides of any steps have handrails. Any raised decks and porches should have guardrails on the edges. Have any leaves, snow, or ice cleared regularly. Use sand or salt on walking paths during winter. Clean up any spills in your garage right away. This includes oil or grease spills. What can I do in the bathroom? Use night lights. Install grab bars by the toilet and in the tub and shower. Do not use towel bars as grab bars. Use non-skid mats or decals in the tub or shower. If you need to sit down in the shower, use a plastic, non-slip stool. Keep the floor dry. Clean up any water that spills on the floor as soon as it happens. Remove soap buildup in the tub or shower regularly. Attach bath mats securely with double-sided non-slip rug tape. Do not have throw rugs and other things on the floor that can make you trip. What can I do in the bedroom? Use night lights. Make sure that you have a  light by your bed that is easy to reach. Do not use any sheets or blankets that are too big for your bed. They should not hang down onto the floor. Have a firm chair that has side arms. You can use this for support while you get dressed. Do not have throw rugs and other things on the floor that can make you trip. What can I do in the kitchen? Clean up any spills right away. Avoid walking on wet floors. Keep items that you use a lot in easy-to-reach places. If you need to reach something above you, use a strong step stool that has a grab bar. Keep electrical cords out of the way. Do not use floor polish or wax that makes floors slippery. If you must use wax, use non-skid floor wax. Do not have throw rugs and other things on the floor that can make you trip. What can I do with my stairs? Do not leave any items on the stairs. Make sure that there are handrails on both sides of the stairs and use them. Fix handrails that are broken or loose. Make sure that handrails are as long as the stairways. Check any carpeting to make sure that it is firmly attached to the stairs. Fix any carpet that is loose or worn. Avoid having throw  rugs at the top or bottom of the stairs. If you do have throw rugs, attach them to the floor with carpet tape. Make sure that you have a light switch at the top of the stairs and the bottom of the stairs. If you do not have them, ask someone to add them for you. What else can I do to help prevent falls? Wear shoes that: Do not have high heels. Have rubber bottoms. Are comfortable and fit you well. Are closed at the toe. Do not wear sandals. If you use a stepladder: Make sure that it is fully opened. Do not climb a closed stepladder. Make sure that both sides of the stepladder are locked into place. Ask someone to hold it for you, if possible. Clearly mark and make sure that you can see: Any grab bars or handrails. First and last steps. Where the edge of each step  is. Use tools that help you move around (mobility aids) if they are needed. These include: Canes. Walkers. Scooters. Crutches. Turn on the lights when you go into a dark area. Replace any light bulbs as soon as they burn out. Set up your furniture so you have a clear path. Avoid moving your furniture around. If any of your floors are uneven, fix them. If there are any pets around you, be aware of where they are. Review your medicines with your doctor. Some medicines can make you feel dizzy. This can increase your chance of falling. Ask your doctor what other things that you can do to help prevent falls. This information is not intended to replace advice given to you by your health care provider. Make sure you discuss any questions you have with your health care provider. Document Released: 03/04/2009 Document Revised: 10/14/2015 Document Reviewed: 06/12/2014 Elsevier Interactive Patient Education  2017 [employer].

## 2022-09-05 NOTE — Progress Notes (Signed)
Subjective:   Deborah Randall is a 82 y.o. female who presents for Medicare Annual (Subsequent) preventive examination.  Review of Systems     Cardiac Risk Factors include: advanced age (>49men, >41 women);hypertension;sedentary lifestyle     Objective:    Today's Vitals   09/05/22 1101  BP: 104/69  Pulse: 83  Resp: 14  Temp: 97.7 F (36.5 C)  TempSrc: Temporal  SpO2: 98%  Weight: 133 lb 3.2 oz (60.4 kg)  Height: 5' 2.21" (1.58 m)  PainSc: 0-No pain   Body mass index is 24.2 kg/m.     09/05/2022   10:58 AM 06/21/2022   10:21 AM 06/08/2022   12:32 PM 06/07/2022    3:04 PM 05/01/2022    9:32 AM 04/25/2022   10:10 AM 09/28/2021   10:48 AM  Advanced Directives  Does Patient Have a Medical Advance Directive? Yes Yes Yes Yes Yes Yes No  Type of Advance Directive Living will;Healthcare Power of Attorney Living will Living will Healthcare Power of [employer] Power of Attorney Healthcare Power of Attorney   Does patient want to make changes to medical advance directive? No - Patient declined No - Patient declined  No - Patient declined No - Patient declined No - Patient declined   Copy of Healthcare Power of Attorney in Chart? No - copy requested    No - copy requested No - copy requested   Would patient like information on creating a medical advance directive?       No - Patient declined    Current Medications (verified) Outpatient Encounter Medications as of 09/05/2022  Medication Sig   acetaminophen (TYLENOL) 325 MG tablet Take 2 tablets (650 mg total) by mouth every 4 (four) hours as needed for headache or mild pain.   albuterol (VENTOLIN HFA) 108 (90 Base) MCG/ACT inhaler Inhale 1 puff into the lungs every 6 (six) hours as needed for shortness of breath.   ALPRAZolam (XANAX) 0.5 MG tablet TAKE 1 TABLET BY MOUTH DAILY FOR ANXIETY (Patient taking differently: Take 0.5 mg by mouth daily as needed for anxiety.)   calcium citrate (CALCITRATE - DOSED IN MG ELEMENTAL CALCIUM)  950 (200 Ca) MG tablet Take 200 mg of elemental calcium by mouth daily.   Cholecalciferol (VITAMIN D3) 2000 UNITS TABS Take 2,000 Units by mouth daily.   docusate sodium (COLACE) 100 MG capsule Take 1 capsule (100 mg total) by mouth daily.   ELIQUIS 5 MG TABS tablet TAKE 1 TABLET BY MOUTH TWICE DAILY   furosemide (LASIX) 40 MG tablet TAKE 1 TABLET BY MOUTH DAILY   gabapentin (NEURONTIN) 100 MG capsule TAKE 1 CAPSULE BY MOUTH DAILY AT BEDTIME   gabapentin (NEURONTIN) 100 MG capsule Take 1 capsule (100 mg total) by mouth 2 (two) times daily.   ibuprofen (ADVIL) 200 MG tablet Take 200 mg by mouth daily as needed for headache or mild pain.   Iron, Ferrous Sulfate, 325 (65 Fe) MG TABS Take 325 mg by mouth daily.   levothyroxine (SYNTHROID) 137 MCG tablet TAKE 1 TABLET BY MOUTH DAILY   meclizine (ANTIVERT) 12.5 MG tablet Take 12.5 mg by mouth as needed for dizziness.   metoprolol tartrate (LOPRESSOR) 50 MG tablet TAKE 1 TABLET BY MOUTH TWICE DAILY   potassium chloride SA (KLOR-CON M) 20 MEQ tablet Take 2 tablets (40 mEq total) by mouth daily.   rosuvastatin (CRESTOR) 10 MG tablet TAKE 1 TABLET BY MOUTH DAILY   sertraline (ZOLOFT) 25 MG tablet TAKE 2 TABLETS BY  MOUTH DAILY   tamoxifen (NOLVADEX) 20 MG tablet Take 1 tablet (20 mg total) by mouth daily.   traMADol (ULTRAM) 50 MG tablet Take 1 tablet (50 mg total) by mouth every 6 (six) hours as needed (mild pain). (Patient not taking: Reported on 09/05/2022)   No facility-administered encounter medications on file as of 09/05/2022.    Allergies (verified) Patient has no known allergies.   History: Past Medical History:  Diagnosis Date   Anxiety    Atrial fibrillation with rapid ventricular response 07/07/2015   Bilateral breast cancer    Daily headache    Depression    GERD (gastroesophageal reflux disease)    Hyperlipidemia    Hypertension    Hypothyroidism    Iron deficiency anemia 2000s   Lumbago    Nausea alone    PONV (postoperative  nausea and vomiting)    Senile osteoporosis    Squamous cell carcinoma of forehead    "I think they burned it off"   Past Surgical History:  Procedure Laterality Date   ABDOMINAL HYSTERECTOMY  1970s   BREAST BIOPSY Bilateral    BREAST BIOPSY Right 05/18/2022   [place] RT BREAST BX W LOC DEV 1ST LESION IMG BX SPEC US GUIDE 05/18/2022 [facility]   BREAST LUMPECTOMY Right 2002   "w/lymph nodes removed"   CARDIOVERSION N/A 08/19/2015   Procedure: CARDIOVERSION;  Surgeon: Thurmon Fair, MD;  Location: [facility];  Service: Cardiovascular;  Laterality: N/A;   CATARACT EXTRACTION W/ INTRAOCULAR LENS  IMPLANT, BILATERAL Bilateral 2000s   DILATION AND CURETTAGE OF UTERUS  ~ 1968   ELBOW FRACTURE SURGERY Left 2011   "has 2 screws in it"   EXCISIONAL HEMORRHOIDECTOMY  ~ 1970   FRACTURE SURGERY  02/12/2016   Right Wrist   HEMIARTHROPLASTY HIP Left 2011   "had ball replaced"   INGUINAL HERNIA REPAIR Right 1980s?   MASTECTOMY Left 2005   MASTECTOMY Right 06/2022   ORIF WRIST FRACTURE Left 02/12/2016   Procedure: OPEN REDUCTION INTERNAL FIXATION LEFT DISTAL RADIUS;  Surgeon: Knute Neu, MD;  Location: [facility] OR;  Service: Plastics;  Laterality: Left;   SIMPLE MASTECTOMY WITH AXILLARY SENTINEL NODE BIOPSY Right 06/22/2022   Procedure: RIGHT SIMPLE MASTECTOMY;  Surgeon: Almond Lint, MD;  Location: [facility] OR;  Service: General;  Laterality: Right;   TOTAL THYROIDECTOMY  1972   WRIST FRACTURE SURGERY Right    "has a plate and 7 screws in there"   Family History  Problem Relation Age of Onset   Cancer Father    Lung cancer Sister    Heart disease Sister    Hypertension Sister    Ovarian cancer Sister    Heart attack Brother    Other Brother    Skin cancer Brother    Other Brother    Other Brother    Alzheimer's disease Brother    Bladder Cancer Brother    Diabetes Brother    COPD Brother    Mental illness Son    Mental illness Son    Social History   Socioeconomic History    Marital status: Widowed    Spouse name: Not on file   Number of children: Not on file   Years of education: Not on file   Highest education level: Not on file  Occupational History   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   Tobacco Use   Smoking status: Never   Smokeless tobacco: Never  Vaping Use   Vaping Use: Never used  Substance and  Sexual Activity   Alcohol use: No   Drug use: No   Sexual activity: Never  Other Topics Concern   Not on file  Social History Narrative   Lives alone   Widow   2 sons , one son decease, one son disable    Works at [employer] in [employer]   Enjoys going places with friends   Never smoked   Alcohol none   Exercise -walk, works in yard    Social Determinants of Corporate investment banker Strain: Low Risk  (05/16/2017)   Overall Financial Resource Strain (CARDIA)    Difficulty of Paying Living Expenses: Not hard at all  Food Insecurity: No Food Insecurity (06/20/2022)   Hunger Vital Sign    Worried About Running Out of Food in the Last Year: Never true    Ran Out of Food in the Last Year: Never true  Transportation Needs: No Transportation Needs (06/20/2022)   PRAPARE - Administrator, Civil Service (Medical): No    Lack of Transportation (Non-Medical): No  Physical Activity: Inactive (05/16/2017)   Exercise Vital Sign    Days of Exercise per Week: 0 days    Minutes of Exercise per Session: 0 min  Stress: No Stress Concern Present (05/16/2017)   [employer] of Occupational Health - Occupational Stress Questionnaire    Feeling of Stress : Not at all  Social Connections: Moderately Integrated (05/16/2017)   Social Connection and Isolation Panel [NHANES]    Frequency of Communication with Friends and Family: More than three times a week    Frequency of Social Gatherings with Friends and Family: Three times a week    Attends Religious Services: 1 to 4 times per year    Active Member of Clubs or Organizations:  Yes    Attends Banker Meetings: Never    Marital Status: Widowed    Tobacco Counseling Counseling given: Not Answered   Clinical Intake:  Pre-visit preparation completed: No  Pain : 0-10 Pain Score:  (no pain today) Pain Type: Chronic pain Pain Location: Back Pain Orientation: Lower Pain Radiating Towards: No Pain Descriptors / Indicators: Aching Pain Onset: More than a month ago (several years) Pain Frequency: Intermittent Pain Relieving Factors: rest Effect of Pain on Daily Activities: standing  Pain Relieving Factors: rest  BMI - recorded: 24.2   Diabetic?No     Activities of Daily Living    09/05/2022   11:26 AM 09/05/2022   11:00 AM  In your present state of health, do you have any difficulty performing the following activities:  Hearing? 0 0  Vision? 0 0  Difficulty concentrating or making decisions? 0 0  Walking or climbing stairs? 1 1  Comment uses handrail   Dressing or bathing? 0 0  Doing errands, shopping? 0 0  Preparing Food and eating ? [place]  Comment daughter assist   Using the Toilet? N N  In the past six months, have you accidently leaked urine? N N  Do you have problems with loss of bowel control? N N  Managing your Medications? N N  Managing your Finances? N N  Housekeeping or managing your Housekeeping? Daryel Gerald  Comment daughter assist     Patient Care Team: Kaiel Weide, Donalee Citrin, NP as PCP - General (Family Medicine) Croitoru, Rachelle Hora, MD as PCP - Cardiology (Cardiology) Croitoru, Rachelle Hora, MD as Consulting Physician (Cardiology) [place], Amy, MD as Consulting Physician (Dermatology) Duke, Roe Rutherford, [place] as Physician Assistant (Cardiology) Padroni, [place]  C, RN as Oncology Nurse Navigator Donnelly Angelica, RN as Oncology Nurse Navigator Rachel Moulds, MD as Consulting Physician (Hematology and Oncology)  Indicate any recent Medical Services you may have received from other than [facility] providers in the past year (date may be  approximate).     Assessment:   This is a routine wellness examination for [facility].  Hearing/Vision screen No results found.  Dietary issues and exercise activities discussed: Current Exercise Habits: The patient does not participate in regular exercise at present, Exercise limited by: orthopedic condition(s) (lower back pain)   Goals Addressed             This Visit's Progress    Increase water intake   Not on track    Starting 03/02/16, I will attempt to drink 3 bottles of water per day, instead of 1-2 bottles.     Patient Stated       Walk good a gain        Depression Screen    09/05/2022   11:00 AM 09/05/2022   10:59 AM 05/31/2021    2:54 PM 05/27/2020   11:09 AM 01/19/2020   10:48 AM 01/15/2020    9:22 AM 05/26/2019   10:02 AM  PHQ 2/9 Scores  PHQ - 2 Score 0 0 0 0 0 0 0  PHQ- 9 Score 1          Fall Risk    09/05/2022   10:59 AM 05/01/2022    9:31 AM 04/25/2022   10:10 AM 09/28/2021   10:47 AM 05/31/2021    2:54 PM  Fall Risk   Falls in the past year? 1 0 0 1 0  Comment    09/08/2021 patient had fall   Number falls in past yr: 0 0 0 0 0  Injury with Fall? 1 0 0 1 0  Comment    fractured bone in right elbow.   Risk for fall due to : History of fall(s);Impaired mobility;Impaired balance/gait No Fall Risks No Fall Risks History of fall(s) No Fall Risks  Follow up Falls evaluation completed;Education provided;Falls prevention discussed Falls evaluation completed Falls evaluation completed Falls evaluation completed;Education provided;Falls prevention discussed Falls evaluation completed    FALL RISK PREVENTION PERTAINING TO THE HOME:  Any stairs in or around the home? Yes  If so, are there any without handrails? Yes  Home free of loose throw rugs in walkways, pet beds, electrical cords, etc? No  Adequate lighting in your home to reduce risk of falls? Yes   ASSISTIVE DEVICES UTILIZED TO PREVENT FALLS:  Life alert? No  Use of a cane, walker or w/c? Yes  Grab  bars in the bathroom? No  Shower chair or bench in shower? Yes  Elevated toilet seat or a handicapped toilet? No   TIMED UP AND GO:  Was the test performed? Yes .  Length of time to ambulate 10 feet: 25  sec.   Gait slow and steady with assistive device  Cognitive Function:    09/05/2022   11:05 AM 05/16/2017   10:29 AM 03/02/2016    1:29 PM 02/19/2015    9:03 AM 09/25/2013    8:43 AM  MMSE - Mini Mental State Exam  Orientation to time 5 5 5 5 5   Orientation to Place 5 5 5 5 5   Registration 3 3 3 3 3   Attention/ Calculation 5 5 5 5 5   Recall 3 1 3 3 2   Language- name 2 objects 2 2 2  2 2  Language- repeat Language- follow 3 step command Language- read & follow direction Write a sentence Copy design Total score 05/31/2021    2:55 PM 05/27/2020   11:10 AM 05/26/2019   10:06 AM  6CIT Screen  What Year? 0 points 0 points 0 points  What month? 0 points 0 points 0 points  What time? 0 points 0 points 0 points  Count back from 20 0 points 0 points 0 points  Months in reverse 4 points 0 points 0 points  Repeat phrase 0 points 2 points 0 points  Total Score 4 points 2 points 0 points    Immunizations Immunization History  Administered Date(s) Administered   Fluad Quad(high Dose 65+) 03/20/2019, 06/03/2020, 03/31/2021, 04/25/2022   Influenza, High Dose Seasonal PF 02/08/2017, 04/04/2018   Influenza,inj,Quad PF,6+ Mos 03/14/2013, 03/26/2014, 02/19/2015, 03/02/2016   PFIZER(Purple Top)SARS-COV-2 Vaccination 07/21/2019, 08/13/2019, 07/01/2020   Pneumococcal Conjugate-13 08/02/2013, 08/03/2014   Pneumococcal Polysaccharide-23 05/22/2009   Td 10/22/2017   Zoster Recombinat (Shingrix) 05/20/2018, 11/06/2018   Zoster, Live 09/25/2013    TDAP status: Up to date  Flu Vaccine status: Up to date  Pneumococcal vaccine status: Up to date  Covid-19 vaccine status: Information provided on how to obtain  vaccines.   Qualifies for Shingles Vaccine? Yes   Zostavax completed Yes   Shingrix Completed?: Yes  Screening Tests Health Maintenance  Topic Date Due   COVID-19 Vaccine (4 - 2023-24 season) 09/21/2022 (Originally 01/20/2022)   INFLUENZA VACCINE  12/21/2022   Medicare Annual Wellness (AWV)  09/05/2023   DTaP/Tdap/Td (2 - Tdap) 10/23/2027   Pneumonia Vaccine 8+ Years old  Completed   DEXA SCAN  Completed   Zoster Vaccines- Shingrix  Completed   HPV VACCINES  Aged Out    Health Maintenance  There are no preventive care reminders to display for this patient.   Colorectal cancer screening: No longer required.   Mammogram status: Completed 04/2022. Repeat every year  Bone Density status: Completed 11/24/2021 . Results reflect: Bone density results: OSTEOPOROSIS. Repeat every 2 years.  Lung Cancer Screening: (Low Dose CT Chest recommended if Age 70-80 years, 30 pack-year currently smoking OR have quit w/in 15years.) does not qualify.   Lung Cancer Screening Referral: No  Additional Screening:  Hepatitis C Screening: does not qualify; Completed No   Vision Screening: Recommended annual ophthalmology exams for early detection of glaucoma and other disorders of the eye. Is the patient up to date with their annual eye exam?  No  Who is the provider or what is the name of the office in which the patient attends annual eye exams? [facility] If pt is not established with a provider, would they like to be referred to a provider to establish care? No .   Dental Screening: Recommended annual dental exams for proper oral hygiene  Community Resource Referral / Chronic Care Management: CRR required this visit?  No   CCM required this visit?  No      Plan:     I have personally reviewed and noted the following in the patient's chart:   Medical and social history Use of alcohol, tobacco or illicit drugs  Current medications and supplements including opioid  prescriptions. Patient is not currently taking opioid prescriptions.  Functional ability and status Nutritional status Physical activity Advanced directives List of other physicians Hospitalizations, surgeries, and ER visits in previous 12 months Vitals Screenings to include cognitive, depression, and falls Referrals and appointments  In addition, I have reviewed and discussed with patient certain preventive protocols, quality metrics, and best practice recommendations. A written personalized care plan for preventive services as well as general preventive health recommendations were provided to patient.     Caesar Bookman, NP   09/05/2022   Nurse Notes: COVID-19 vaccine advised to get at the pharmacy.

## 2022-09-27 ENCOUNTER — Telehealth: Payer: Self-pay | Admitting: Adult Health

## 2022-09-27 NOTE — Telephone Encounter (Signed)
Rescheduled appointment per room/resource. Patient is aware of the changes made to her upcoming appointment. 

## 2022-10-18 ENCOUNTER — Inpatient Hospital Stay: Payer: Medicare PPO | Admitting: Adult Health

## 2022-10-20 ENCOUNTER — Inpatient Hospital Stay: Payer: Medicare PPO | Attending: Hematology and Oncology | Admitting: Adult Health

## 2022-10-20 ENCOUNTER — Encounter: Payer: Self-pay | Admitting: Adult Health

## 2022-10-20 VITALS — BP 115/73 | HR 77 | Temp 97.9°F | Resp 18 | Wt 132.5 lb

## 2022-10-20 DIAGNOSIS — Z170 Estrogen receptor positive status [ER+]: Secondary | ICD-10-CM | POA: Diagnosis not present

## 2022-10-20 DIAGNOSIS — Z79810 Long term (current) use of selective estrogen receptor modulators (SERMs): Secondary | ICD-10-CM | POA: Diagnosis not present

## 2022-10-20 DIAGNOSIS — Z9011 Acquired absence of right breast and nipple: Secondary | ICD-10-CM | POA: Diagnosis not present

## 2022-10-20 DIAGNOSIS — C50411 Malignant neoplasm of upper-outer quadrant of right female breast: Secondary | ICD-10-CM | POA: Diagnosis not present

## 2022-10-20 NOTE — Progress Notes (Signed)
SURVIVORSHIP VISIT:  BRIEF ONCOLOGIC HISTORY:  Oncology History  Malignant neoplasm of upper-outer quadrant of right breast in female, estrogen receptor positive (HCC)  05/18/2022 Pathology Results   Invasive poorly differentiated ductal adenocarcinoma, grade 3. ER PR 100% strongly positive, Her 2 negative.   06/08/2022 Initial Diagnosis   Malignant neoplasm of upper-outer quadrant of right breast in female, estrogen receptor positive (HCC)   06/08/2022 Cancer Staging   Staging form: Breast, AJCC 8th Edition - Clinical stage from 06/08/2022: Stage IIA (cT2, cN0, cM0, G3, ER+, PR+, HER2-) - Signed by Erven Colla, PA-C on 06/08/2022 Stage prefix: Initial diagnosis Histologic grading system: 3 grade system   06/2022 -  Anti-estrogen oral therapy   Tamoxifen     INTERVAL HISTORY:  Deborah Randall to review her survivorship care plan detailing her treatment course for breast cancer, as well as monitoring long-term side effects of that treatment, education regarding health maintenance, screening, and overall wellness and health promotion.     Overall, Deborah Randall reports feeling quite well.  She is taking Tamoxifen daily with good tolerance.  She continues to work on her mobility and strength which she feels are improving.    REVIEW OF SYSTEMS:  Review of Systems  Constitutional:  Negative for appetite change, chills, fatigue, fever and unexpected weight change.  HENT:   Negative for hearing loss, lump/mass and trouble swallowing.   Eyes:  Negative for eye problems and icterus.  Respiratory:  Negative for chest tightness, cough and shortness of breath.   Cardiovascular:  Negative for chest pain, leg swelling and palpitations.  Gastrointestinal:  Negative for abdominal distention, abdominal pain, constipation, diarrhea, nausea and vomiting.  Endocrine: Negative for hot flashes.  Genitourinary:  Negative for difficulty urinating.   Musculoskeletal:  Negative for arthralgias.  Skin:  Negative for  itching and rash.  Neurological:  Negative for dizziness, extremity weakness, headaches and numbness.  Hematological:  Negative for adenopathy. Does not bruise/bleed easily.  Psychiatric/Behavioral:  Negative for depression. The patient is not nervous/anxious.   Breast: Denies any new nodularity, masses, tenderness, nipple changes, or nipple discharge.     PAST MEDICAL/SURGICAL HISTORY:  Past Medical History:  Diagnosis Date   Anxiety    Atrial fibrillation with rapid ventricular response (HCC) 07/07/2015   Bilateral breast cancer (HCC)    Daily headache    Depression    GERD (gastroesophageal reflux disease)    Hyperlipidemia    Hypertension    Hypothyroidism    Iron deficiency anemia 2000s   Lumbago    Nausea alone    PONV (postoperative nausea and vomiting)    Senile osteoporosis    Squamous cell carcinoma of forehead    "I think they burned it off"   Past Surgical History:  Procedure Laterality Date   ABDOMINAL HYSTERECTOMY  1970s   BREAST BIOPSY Bilateral    BREAST BIOPSY Right 05/18/2022   [place] RT BREAST BX W LOC DEV 1ST LESION IMG BX SPEC US GUIDE 05/18/2022 [facility]   BREAST LUMPECTOMY Right 2002   "w/lymph nodes removed"   CARDIOVERSION N/A 08/19/2015   Procedure: CARDIOVERSION;  Surgeon: Thurmon Fair, MD;  Location: [facility];  Service: Cardiovascular;  Laterality: N/A;   CATARACT EXTRACTION W/ INTRAOCULAR LENS  IMPLANT, BILATERAL Bilateral 2000s   DILATION AND CURETTAGE OF UTERUS  ~ 1968   ELBOW FRACTURE SURGERY Left 2011   "has 2 screws in it"   EXCISIONAL HEMORRHOIDECTOMY  ~ 1970   FRACTURE SURGERY  02/12/2016   Right  Wrist   HEMIARTHROPLASTY HIP Left 2011   "had ball replaced"   INGUINAL HERNIA REPAIR Right 1980s?   MASTECTOMY Left 2005   MASTECTOMY Right 06/2022   ORIF WRIST FRACTURE Left 02/12/2016   Procedure: OPEN REDUCTION INTERNAL FIXATION LEFT DISTAL RADIUS;  Surgeon: Knute Neu, MD;  Location: [facility] OR;  Service: Plastics;   Laterality: Left;   SIMPLE MASTECTOMY WITH AXILLARY SENTINEL NODE BIOPSY Right 06/22/2022   Procedure: RIGHT SIMPLE MASTECTOMY;  Surgeon: Almond Lint, MD;  Location: [facility];  Service: General;  Laterality: Right;   TOTAL THYROIDECTOMY  1972   WRIST FRACTURE SURGERY Right    "has a plate and 7 screws in there"     ALLERGIES:  No Known Allergies   CURRENT MEDICATIONS:  Outpatient Encounter Medications as of 10/20/2022  Medication Sig   acetaminophen (TYLENOL) 325 MG tablet Take 2 tablets (650 mg total) by mouth every 4 (four) hours as needed for headache or mild pain.   albuterol (VENTOLIN HFA) 108 (90 Base) MCG/ACT inhaler Inhale 1 puff into the lungs every 6 (six) hours as needed for shortness of breath.   ALPRAZolam (XANAX) 0.5 MG tablet TAKE 1 TABLET BY MOUTH DAILY FOR ANXIETY (Patient taking differently: Take 0.5 mg by mouth daily as needed for anxiety.)   calcium citrate (CALCITRATE - DOSED IN MG ELEMENTAL CALCIUM) 950 (200 Ca) MG tablet Take 200 mg of elemental calcium by mouth daily.   Cholecalciferol (VITAMIN D3) 2000 UNITS TABS Take 2,000 Units by mouth daily.   docusate sodium (COLACE) 100 MG capsule Take 1 capsule (100 mg total) by mouth daily.   ELIQUIS 5 MG TABS tablet TAKE 1 TABLET BY MOUTH TWICE DAILY   furosemide (LASIX) 40 MG tablet TAKE 1 TABLET BY MOUTH DAILY   gabapentin (NEURONTIN) 100 MG capsule TAKE 1 CAPSULE BY MOUTH DAILY AT BEDTIME   gabapentin (NEURONTIN) 100 MG capsule Take 1 capsule (100 mg total) by mouth 2 (two) times daily.   ibuprofen (ADVIL) 200 MG tablet Take 200 mg by mouth daily as needed for headache or mild pain.   Iron, Ferrous Sulfate, 325 (65 Fe) MG TABS Take 325 mg by mouth daily.   levothyroxine (SYNTHROID) 137 MCG tablet TAKE 1 TABLET BY MOUTH DAILY   meclizine (ANTIVERT) 12.5 MG tablet Take 12.5 mg by mouth as needed for dizziness.   metoprolol tartrate (LOPRESSOR) 50 MG tablet TAKE 1 TABLET BY MOUTH TWICE DAILY   potassium chloride SA  (KLOR-CON M) 20 MEQ tablet Take 2 tablets (40 mEq total) by mouth daily.   rosuvastatin (CRESTOR) 10 MG tablet TAKE 1 TABLET BY MOUTH DAILY   sertraline (ZOLOFT) 25 MG tablet TAKE 2 TABLETS BY MOUTH DAILY   tamoxifen (NOLVADEX) 20 MG tablet Take 1 tablet (20 mg total) by mouth daily.   No facility-administered encounter medications on file as of 10/20/2022.     ONCOLOGIC FAMILY HISTORY:  Family History  Problem Relation Age of Onset   Cancer Father    Lung cancer Sister    Heart disease Sister    Hypertension Sister    Ovarian cancer Sister    Heart attack Brother    Other Brother    Skin cancer Brother    Other Brother    Other Brother    Alzheimer's disease Brother    Bladder Cancer Brother    Diabetes Brother    COPD Brother    Mental illness Son    Mental illness Son      SOCIAL  HISTORY:  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   Tobacco Use   Smoking status: Never   Smokeless tobacco: Never  Vaping Use   Vaping Use: Never used  Substance and Sexual Activity   Alcohol use: No   Drug use: No   Sexual activity: Never  Other Topics Concern   Not on file  Social History Narrative   Lives alone   Widow   2 sons , one son decease, one son disable    Works at [employer] in [employer]   Enjoys going places with friends   Never smoked   Alcohol none   Exercise -walk, works in yard    Social Determinants of Corporate investment banker Strain: Low Risk  (05/16/2017)   Overall Financial Resource Strain (CARDIA)    Difficulty of Paying Living Expenses: Not hard at all  Food Insecurity: No Food Insecurity (06/20/2022)   Hunger Vital Sign    Worried About Running Out of Food in the Last Year: Never true    Ran Out of Food in  the Last Year: Never true  Transportation Needs: No Transportation Needs (06/20/2022)   PRAPARE - Administrator, Civil Service (Medical): No    Lack of Transportation (Non-Medical): No  Physical Activity: Inactive (05/16/2017)   Exercise Vital Sign    Days of Exercise per Week: 0 days    Minutes of Exercise per Session: 0 min  Stress: No Stress Concern Present (05/16/2017)   [employer] of Occupational Health - Occupational Stress Questionnaire    Feeling of Stress : Not at all  Social Connections: Moderately Integrated (05/16/2017)   Social Connection and Isolation Panel [NHANES]    Frequency of Communication with Friends and Family: More than three times a week    Frequency of Social Gatherings with Friends and Family: Three times a week    Attends Religious Services: 1 to 4 times per year    Active Member of Clubs or Organizations: Yes    Attends Banker Meetings: Never    Marital Status: Widowed  Intimate Partner Violence: Not At Risk (05/16/2017)   Humiliation, Afraid, Rape, and Kick questionnaire    Fear of Current or Ex-Partner: No    Emotionally Abused: No    Physically Abused: No    Sexually Abused: No     OBSERVATIONS/OBJECTIVE:  BP 115/73 (BP Location: Left Arm, Patient Position: Sitting)   Pulse 77   Temp 97.9 F (36.6 C) (Oral)   Resp 18   Wt 132 lb 8 oz (60.1 kg)   SpO2 97%   BMI 24.08 kg/m  GENERAL: Patient is a well appearing female in no acute distress HEENT:  Sclerae anicteric.  Oropharynx clear and moist. No ulcerations or evidence of oropharyngeal candidiasis. Neck is supple.  NODES:  No cervical, supraclavicular, or axillary lymphadenopathy palpated.  BREAST EXAM: Status post bilateral mass ectomy's no sign of local recurrence. LUNGS:  Clear to auscultation bilaterally.  No wheezes or rhonchi. HEART:  Regular rate and rhythm. No murmur appreciated. ABDOMEN:  Soft, nontender.  Positive, normoactive bowel sounds. No  organomegaly palpated. MSK:  No focal spinal tenderness to palpation. Full range of motion bilaterally in the upper  extremities. EXTREMITIES:  No peripheral edema.   SKIN:  Clear with no obvious rashes or skin changes. No nail dyscrasia. NEURO:  Nonfocal. Well oriented.  Appropriate affect.   LABORATORY DATA:  None for this visit.  DIAGNOSTIC IMAGING:  None for this visit.      ASSESSMENT AND PLAN:  Ms.. Randall is a pleasant 82 y.o. female with recurrent stage IB right breast invasive ductal carcinoma, ER+/PR+/HER2-, diagnosed in 05/2022, treated with mastectomy and anti-estrogen therapy with tamoxifen beginning in 06/2022.  She presents to the Survivorship Clinic for our initial meeting and routine follow-up post-completion of treatment for breast cancer.    1. Stage IB right breast cancer:  Ms. Houfek is continuing to recover from definitive treatment for breast cancer. She will follow-up with her medical oncologist, Dr.  Al Pimple in 6 months with history and physical exam per surveillance protocol.  She will continue her anti-estrogen therapy with Tamoxifen. Thus far, she is tolerating the Tamoxifen well, with minimal side effects. Today, a comprehensive survivorship care plan and treatment summary was reviewed with the patient today detailing her breast cancer diagnosis, treatment course, potential late/long-term effects of treatment, appropriate follow-up care with recommendations for the future, and patient education resources.  A copy of this summary, along with a letter will be sent to the patient's primary care provider via mail/fax/In Basket message after today's visit.    2. Bone health:   She was given education on specific activities to promote bone health.  3. Cancer screening:  Due to Ms. Goldwire's history and her age, she should receive screening for skin cancers.  The information and recommendations are listed on the patient's comprehensive care plan/treatment summary and were reviewed in  detail with the patient.    4. Health maintenance and wellness promotion: Ms. Morrical was encouraged to consume 5-7 servings of fruits and vegetables per day. We reviewed the "Nutrition Rainbow" handout.  She was also encouraged to engage in moderate to vigorous exercise for 30 minutes per day most days of the week.  She was instructed to limit her alcohol consumption and continue to abstain from tobacco use.     5. Support services/counseling: It is not uncommon for this period of the patient's cancer care trajectory to be one of many emotions and stressors.  She was given information regarding our available services and encouraged to contact me with any questions or for help enrolling in any of our support group/programs.    Follow up instructions:    -Return to cancer center in 6 months for follow-up with Dr. Al Pimple -She is welcome to return back to the Survivorship Clinic at any time; no additional follow-up needed at this time.  -Consider referral back to survivorship as a long-term survivor for continued surveillance  The patient was provided an opportunity to ask questions and all were answered. The patient agreed with the plan and demonstrated an understanding of the instructions.   Total encounter time:30 minutes*in face-to-face visit time, chart review, lab review, care coordination, order entry, and documentation of the encounter time.    Lillard Anes, NP 10/20/22 12:34 PM Medical Oncology and Hematology [facility] [address] [place], [place] [address] Tel. [phone]    Fax. [phone]  *Total Encounter Time as defined by the Centers for Medicare and Medicaid Services includes, in addition to the face-to-face time of a patient visit (documented in the note above) non-face-to-face time: obtaining and reviewing outside history, ordering and reviewing medications, tests or procedures, care  coordination (communications with other health care professionals or  caregivers) and documentation in the medical record.

## 2022-10-25 ENCOUNTER — Other Ambulatory Visit: Payer: Self-pay | Admitting: Cardiovascular Disease

## 2022-10-26 ENCOUNTER — Encounter: Payer: Self-pay | Admitting: Family

## 2022-10-26 ENCOUNTER — Ambulatory Visit (INDEPENDENT_AMBULATORY_CARE_PROVIDER_SITE_OTHER): Payer: Medicare PPO | Admitting: Family

## 2022-10-26 VITALS — BP 116/78 | HR 80 | Temp 97.6°F | Resp 20 | Ht 62.21 in | Wt 133.0 lb

## 2022-10-26 DIAGNOSIS — I509 Heart failure, unspecified: Secondary | ICD-10-CM

## 2022-10-26 DIAGNOSIS — L989 Disorder of the skin and subcutaneous tissue, unspecified: Secondary | ICD-10-CM | POA: Diagnosis not present

## 2022-10-26 DIAGNOSIS — I700 Atherosclerosis of aorta: Secondary | ICD-10-CM | POA: Diagnosis not present

## 2022-10-26 DIAGNOSIS — N1831 Chronic kidney disease, stage 3a: Secondary | ICD-10-CM

## 2022-10-26 DIAGNOSIS — I4819 Other persistent atrial fibrillation: Secondary | ICD-10-CM

## 2022-10-26 DIAGNOSIS — F3341 Major depressive disorder, recurrent, in partial remission: Secondary | ICD-10-CM | POA: Diagnosis not present

## 2022-10-26 DIAGNOSIS — R251 Tremor, unspecified: Secondary | ICD-10-CM

## 2022-10-26 DIAGNOSIS — E039 Hypothyroidism, unspecified: Secondary | ICD-10-CM | POA: Diagnosis not present

## 2022-10-26 DIAGNOSIS — I5032 Chronic diastolic (congestive) heart failure: Secondary | ICD-10-CM | POA: Insufficient documentation

## 2022-10-26 NOTE — Progress Notes (Signed)
Provider: Richarda Blade FNP-C   Renee Erb, Donalee Citrin, NP  Patient Care Team: Fotios Amos, Donalee Citrin, NP as PCP - General (Family Medicine) Croitoru, Rachelle Hora, MD as PCP - Cardiology (Cardiology) [place], Amy, MD as Consulting Physician (Dermatology) Duke, Roe Rutherford, [place] as Physician Assistant (Cardiology) Rachel Moulds, MD as Consulting Physician (Hematology and Oncology) Almond Lint, MD as Consulting Physician (General Surgery)  Extended Emergency Contact Information Primary Emergency Contact: Vendetti,[name] Address: [address]          [place] [address] [place] Home Phone: [phone] Mobile Phone: [phone] Relation: Son Secondary Emergency Contact: [name] [place] of [place] Mobile Phone: [phone] Relation: Granddaughter  Code Status:  Full Code  Goals of care: Advanced Directive information    09/05/2022   10:58 AM  Advanced Directives  Does Patient Have a Medical Advance Directive? Yes  Type of Advance Directive Living will;Healthcare Power of Attorney  Does patient want to make changes to medical advance directive? No - Patient declined  Copy of Healthcare Power of Attorney in Chart? No - copy requested     Chief Complaint  Patient presents with   Medical Management of Chronic Issues    Patient presents today for a 6 month follow-up   Quality Metric Gaps    COVID#4    HPI:  Pt is a 82 y.o. female seen today for 6 months follow-up for medical management of chronic diseases.   Tremors - states worsening sometimes at rest then head pulls.Hands shakes.   Hypothyroidism - takes levothyroxine on empty stomach.  Afib - follows up with cardiologist.states sometimes has small episode of palpitation but does not last long.on Eliquis.no side effects report.    Dizziness - has occasional.Has not needed meclizine   Constipation - eats fruits and takes colace which have been effective.  Lower extremities edema - takes  Furosemide daily. Edema has improved.weight stable. No shortness of breath when getting around except going up the hill.  States lives by herself but her son lives behind her and a brother next to her too.does own ADL's except son mown's the grass.Tells her two young grandchildren will be staying with her during the day for the summer since the off school.she is excited to have them over.   No fall episode.   Past Medical History:  Diagnosis Date   Anxiety    Atrial fibrillation with rapid ventricular response (HCC) 07/07/2015   Bilateral breast cancer (HCC)    Daily headache    Depression    GERD (gastroesophageal reflux disease)    Hyperlipidemia    Hypertension    Hypothyroidism    Iron deficiency anemia 2000s   Lumbago    Nausea alone    PONV (postoperative nausea and vomiting)    Senile osteoporosis    Squamous cell carcinoma of forehead    "I think they burned it off"   Past Surgical History:  Procedure Laterality Date   ABDOMINAL HYSTERECTOMY  1970s   BREAST BIOPSY Bilateral    BREAST BIOPSY Right 05/18/2022   [place] RT BREAST BX W LOC DEV 1ST LESION IMG BX SPEC US GUIDE 05/18/2022 [facility]   BREAST LUMPECTOMY Right 2002   "w/lymph nodes removed"   CARDIOVERSION N/A 08/19/2015   Procedure: CARDIOVERSION;  Surgeon: Thurmon Fair, MD;  Location: [facility];  Service: Cardiovascular;  Laterality: N/A;   CATARACT EXTRACTION W/ INTRAOCULAR LENS  IMPLANT, BILATERAL Bilateral 2000s   DILATION AND CURETTAGE OF UTERUS  ~ 1968  ELBOW FRACTURE SURGERY Left 2011   "has 2 screws in it"   EXCISIONAL HEMORRHOIDECTOMY  ~ 1970   FRACTURE SURGERY  02/12/2016   Right Wrist   HEMIARTHROPLASTY HIP Left 2011   "had ball replaced"   INGUINAL HERNIA REPAIR Right 1980s?   MASTECTOMY Left 2005   MASTECTOMY Right 06/2022   ORIF WRIST FRACTURE Left 02/12/2016   Procedure: OPEN REDUCTION INTERNAL FIXATION LEFT DISTAL RADIUS;  Surgeon: Knute Neu, MD;  Location: [facility];  Service:  Plastics;  Laterality: Left;   SIMPLE MASTECTOMY WITH AXILLARY SENTINEL NODE BIOPSY Right 06/22/2022   Procedure: RIGHT SIMPLE MASTECTOMY;  Surgeon: Almond Lint, MD;  Location: [facility];  Service: General;  Laterality: Right;   TOTAL THYROIDECTOMY  1972   WRIST FRACTURE SURGERY Right    "has a plate and 7 screws in there"    No Known Allergies  Allergies as of 10/26/2022   No Known Allergies      Medication List        Accurate as of October 26, 2022 10:28 AM. If you have any questions, ask your nurse or doctor.          acetaminophen 325 MG tablet Commonly known as: TYLENOL Take 2 tablets (650 mg total) by mouth every 4 (four) hours as needed for headache or mild pain.   albuterol 108 (90 Base) MCG/ACT inhaler Commonly known as: VENTOLIN HFA Inhale 1 puff into the lungs every 6 (six) hours as needed for shortness of breath.   ALPRAZolam 0.5 MG tablet Commonly known as: XANAX TAKE 1 TABLET BY MOUTH DAILY FOR ANXIETY What changed:  how much to take how to take this when to take this reasons to take this additional instructions   calcium citrate 950 (200 Ca) MG tablet Commonly known as: CALCITRATE - dosed in mg elemental calcium Take 200 mg of elemental calcium by mouth daily.   docusate sodium 100 MG capsule Commonly known as: Colace Take 1 capsule (100 mg total) by mouth daily.   Eliquis 5 MG Tabs tablet Generic drug: apixaban TAKE 1 TABLET BY MOUTH TWICE DAILY   furosemide 40 MG tablet Commonly known as: LASIX TAKE 1 TABLET BY MOUTH DAILY   gabapentin 100 MG capsule Commonly known as: NEURONTIN TAKE 1 CAPSULE BY MOUTH DAILY AT BEDTIME   gabapentin 100 MG capsule Commonly known as: NEURONTIN Take 1 capsule (100 mg total) by mouth 2 (two) times daily.   ibuprofen 200 MG tablet Commonly known as: ADVIL Take 200 mg by mouth daily as needed for headache or mild pain.   Iron (Ferrous Sulfate) 325 (65 Fe) MG Tabs Take 325 mg by mouth daily.   levothyroxine  137 MCG tablet Commonly known as: SYNTHROID TAKE 1 TABLET BY MOUTH DAILY   meclizine 12.5 MG tablet Commonly known as: ANTIVERT Take 12.5 mg by mouth as needed for dizziness.   metoprolol tartrate 50 MG tablet Commonly known as: LOPRESSOR TAKE 1 TABLET BY MOUTH TWICE DAILY   potassium chloride SA 20 MEQ tablet Commonly known as: KLOR-CON M Take 2 tablets (40 mEq total) by mouth daily.   rosuvastatin 10 MG tablet Commonly known as: CRESTOR TAKE 1 TABLET BY MOUTH DAILY   sertraline 25 MG tablet Commonly known as: ZOLOFT TAKE 2 TABLETS BY MOUTH DAILY   tamoxifen 20 MG tablet Commonly known as: NOLVADEX Take 1 tablet (20 mg total) by mouth daily.   Vitamin D3 50 MCG (2000 UT) Tabs Take 2,000 Units by mouth daily.  Review of Systems  Constitutional:  Negative for appetite change, chills, fatigue, fever and unexpected weight change.  HENT:  Negative for congestion, dental problem, ear discharge, ear pain, facial swelling, hearing loss, nosebleeds, postnasal drip, rhinorrhea, sinus pressure, sinus pain, sneezing, sore throat, tinnitus and trouble swallowing.   Eyes:  Negative for pain, discharge, redness, itching and visual disturbance.  Respiratory:  Negative for cough, chest tightness, shortness of breath and wheezing.   Cardiovascular:  Negative for chest pain, palpitations and leg swelling.  Gastrointestinal:  Negative for abdominal distention, abdominal pain, blood in stool, constipation, diarrhea, nausea and vomiting.  Endocrine: Negative for cold intolerance, heat intolerance, polydipsia, polyphagia and polyuria.  Genitourinary:  Negative for difficulty urinating, dysuria, flank pain, frequency and urgency.  Musculoskeletal:  Positive for gait problem. Negative for arthralgias, back pain, joint swelling, myalgias, neck pain and neck stiffness.  Skin:  Negative for color change, pallor, rash and wound.  Neurological:  Positive for tremors. Negative for dizziness,  syncope, speech difficulty, weakness, light-headedness, numbness and headaches.  Hematological:  Does not bruise/bleed easily.  Psychiatric/Behavioral:  Negative for agitation, behavioral problems, confusion, hallucinations, self-injury, sleep disturbance and suicidal ideas. The patient is not nervous/anxious.     Immunization History  Administered Date(s) Administered   Fluad Quad(high Dose 65+) 03/20/2019, 06/03/2020, 03/31/2021, 04/25/2022   Influenza, High Dose Seasonal PF 02/08/2017, 04/04/2018   Influenza,inj,Quad PF,6+ Mos 03/14/2013, 03/26/2014, 02/19/2015, 03/02/2016   PFIZER(Purple Top)SARS-COV-2 Vaccination 07/21/2019, 08/13/2019, 07/01/2020   Pneumococcal Conjugate-13 08/02/2013, 08/03/2014   Pneumococcal Polysaccharide-23 05/22/2009   Td 10/22/2017   Zoster Recombinat (Shingrix) 05/20/2018, 11/06/2018   Zoster, Live 09/25/2013   Pertinent  Health Maintenance Due  Topic Date Due   INFLUENZA VACCINE  12/21/2022   DEXA SCAN  Completed      09/28/2021   10:47 AM 04/25/2022   10:10 AM 05/01/2022    9:31 AM 09/05/2022   10:59 AM 10/26/2022    9:59 AM  Fall Risk  Falls in the past year? 1 0 0 1 0  Was there an injury with Fall? 1 0 0 1 0  Was there an injury with Fall? - Comments fractured bone in right elbow.      Fall Risk Category Calculator 2 0 0 2 0  Fall Risk Category (Retired) Moderate Low Low    (RETIRED) Patient Fall Risk Level Moderate fall risk Low fall risk Low fall risk    Patient at Risk for Falls Due to History of fall(s) No Fall Risks No Fall Risks History of fall(s);Impaired mobility;Impaired balance/gait No Fall Risks  Fall risk Follow up Falls evaluation completed;Education provided;Falls prevention discussed Falls evaluation completed Falls evaluation completed Falls evaluation completed;Education provided;Falls prevention discussed Falls evaluation completed   Functional Status Survey:    Vitals:   10/26/22 0955  BP: 116/78  Pulse: 80  Resp: 20   Temp: 97.6 F (36.4 C)  SpO2: 98%  Weight: 133 lb (60.3 kg)  Height: 5' 2.21" (1.58 m)   Body mass index is 24.16 kg/m. Physical Exam Vitals reviewed.  Constitutional:      General: She is not in acute distress.    Appearance: Normal appearance. She is normal weight. She is not ill-appearing or diaphoretic.  HENT:     Head: Normocephalic.     Right Ear: Tympanic membrane, ear canal and external ear normal. There is no impacted cerumen.     Left Ear: Tympanic membrane, ear canal and external ear normal. There is no impacted cerumen.  Nose: Nose normal. No congestion or rhinorrhea.     Mouth/Throat:     Mouth: Mucous membranes are moist.     Pharynx: Oropharynx is clear. No oropharyngeal exudate or posterior oropharyngeal erythema.  Eyes:     General: No scleral icterus.       Right eye: No discharge.        Left eye: No discharge.     Extraocular Movements: Extraocular movements intact.     Conjunctiva/sclera: Conjunctivae normal.     Pupils: Pupils are equal, round, and reactive to light.  Neck:     Vascular: No carotid bruit.  Cardiovascular:     Rate and Rhythm: Normal rate and regular rhythm.     Pulses: Normal pulses.     Heart sounds: Normal heart sounds. No murmur heard.    No friction rub. No gallop.  Pulmonary:     Effort: Pulmonary effort is normal. No respiratory distress.     Breath sounds: Normal breath sounds. No wheezing, rhonchi or rales.  Chest:     Chest wall: No tenderness.  Abdominal:     General: Bowel sounds are normal. There is no distension.     Palpations: Abdomen is soft. There is no mass.     Tenderness: There is no abdominal tenderness. There is no right CVA tenderness, left CVA tenderness, guarding or rebound.  Musculoskeletal:        General: No swelling or tenderness. Normal range of motion.     Cervical back: Normal range of motion. No rigidity or tenderness.     Right lower leg: No edema.     Left lower leg: No edema.   Lymphadenopathy:     Cervical: No cervical adenopathy.  Skin:    General: Skin is warm and dry.     Coloration: Skin is not pale.     Findings: Lesion present. No bruising, erythema or rash.     Comments: X 2 shade of brown and black color skin lesion with rough texture on upper abdomen.  Neurological:     Mental Status: She is alert and oriented to person, place, and time.     Cranial Nerves: No cranial nerve deficit.     Sensory: No sensory deficit.     Motor: No weakness.     Coordination: Coordination normal.     Gait: Gait abnormal.  Psychiatric:        Mood and Affect: Mood normal.        Speech: Speech normal.        Behavior: Behavior normal.        Thought Content: Thought content normal.        Judgment: Judgment normal.     Labs reviewed: Recent Labs    04/25/22 1106 06/21/22 1058 06/22/22 0943  NA 144 141 142  K 3.3* 2.9* 2.9*  CL 97* 101 98  CO2 36* 28  --   GLUCOSE 102* 88 93  BUN 13 12 16   CREATININE 1.34* 1.28* 1.40*  CALCIUM 8.9 8.9  --    Recent Labs    04/25/22 1106  AST 29  ALT 24  BILITOT 0.8  PROT 7.1   Recent Labs    04/25/22 1106 06/21/22 1058 06/22/22 0943  WBC 8.4 7.4  --   NEUTROABS 5,284  --   --   HGB 14.8 13.7 14.3  HCT 44.6 42.6 42.0  MCV 92.7 94.0  --   PLT 147 122*  --    Lab Results  Component Value Date   TSH 11.38 (H) 04/25/2022   Lab Results  Component Value Date   HGBA1C 6.0 (H) 09/15/2019   Lab Results  Component Value Date   CHOL 109 09/28/2021   HDL 48 (L) 09/28/2021   LDLCALC 45 09/28/2021   TRIG 81 09/28/2021   CHOLHDL 2.3 09/28/2021    Significant Diagnostic Results in last 30 days:  No results found.  Assessment/Plan 1. Chronic congestive heart failure, unspecified heart failure type (HCC) No signs of fluid overload -Continue on furosemide -Continue to reduce salt intake in the diet - COMPLETE METABOLIC PANEL WITH GFR - CBC with Differential/Platelet  2. Atherosclerosis of aorta  (HCC) Continue on Eliquis for anticoagulation -Continue on rosuvastatin - Lipid panel  3. Persistent atrial fibrillation (HCC) Heart rate control -Continue on metoprolol for rate control -Continue on Eliquis for anticoagulation - Lipid panel - COMPLETE METABOLIC PANEL WITH GFR - CBC with Differential/Platelet  4. Chronic renal impairment, stage 3a (HCC) CR at baseline  - Will continue to avoid Nephrotoxins and dose all other medication for renal clearance  - COMPLETE METABOLIC PANEL WITH GFR  5. Recurrent major depressive disorder, in partial remission (HCC) Mood stable -Continue on sertraline and alprazolam - TSH  6. Occasional tremors Worsening tremors especially on the neck pulling neck which has been painful - Ambulatory referral to Neurology - TSH  7. Acquired hypothyroidism Lab Results  Component Value Date   TSH 11.38 (H) 04/25/2022  -Continue on levothyroxine 137 mcg daily.  Will recheck thyroid level - TSH  8. Skin lesion X 2 shade of brown and black color skin lesion with rough texture on upper abdomen.will send to dermatologist for further evaluation. - Ambulatory referral to Dermatology    Family/ staff Communication: Reviewed plan of care with patient verbalized understanding  Labs/tests ordered:  - Lipid panel - COMPLETE METABOLIC PANEL WITH GFR - CBC with Differential/Platelet - TSH - Lipid panel  Next Appointment : Return in about 6 months (around 04/27/2023) for medical mangement of chronic issues.Caesar Bookman, NP

## 2022-10-27 LAB — COMPLETE METABOLIC PANEL WITH GFR
AG Ratio: 1.4 (calc) (ref 1.0–2.5)
ALT: 10 U/L (ref 6–29)
AST: 18 U/L (ref 10–35)
Albumin: 4 g/dL (ref 3.6–5.1)
Alkaline phosphatase (APISO): 53 U/L (ref 37–153)
BUN/Creatinine Ratio: 10 (calc) (ref 6–22)
BUN: 14 mg/dL (ref 7–25)
CO2: 31 mmol/L (ref 20–32)
Calcium: 9 mg/dL (ref 8.6–10.4)
Chloride: 103 mmol/L (ref 98–110)
Creat: 1.47 mg/dL — ABNORMAL HIGH (ref 0.60–0.95)
Globulin: 2.8 g/dL (calc) (ref 1.9–3.7)
Glucose, Bld: 86 mg/dL (ref 65–99)
Potassium: 3.7 mmol/L (ref 3.5–5.3)
Sodium: 144 mmol/L (ref 135–146)
Total Bilirubin: 0.6 mg/dL (ref 0.2–1.2)
Total Protein: 6.8 g/dL (ref 6.1–8.1)
eGFR: 35 mL/min/{1.73_m2} — ABNORMAL LOW (ref >=60)

## 2022-10-27 LAB — LIPID PANEL
Cholesterol: 142 mg/dL (ref <200)
HDL: 47 mg/dL — ABNORMAL LOW (ref >=50)
LDL Cholesterol (Calc): 72 mg/dL (calc)
Non-HDL Cholesterol (Calc): 95 mg/dL (calc) (ref <130)
Total CHOL/HDL Ratio: 3 (calc) (ref <5.0)
Triglycerides: 148 mg/dL (ref <150)

## 2022-10-27 LAB — CBC WITH DIFFERENTIAL/PLATELET
Absolute Monocytes: 702 cells/uL (ref 200–950)
Basophils Absolute: 70 cells/uL (ref 0–200)
Basophils Relative: 0.9 %
Eosinophils Absolute: 203 cells/uL (ref 15–500)
Eosinophils Relative: 2.6 %
HCT: 43.1 % (ref 35.0–45.0)
Hemoglobin: 13.9 g/dL (ref 11.7–15.5)
Lymphs Abs: 2761 cells/uL (ref 850–3900)
MCH: 29.6 pg (ref 27.0–33.0)
MCHC: 32.3 g/dL (ref 32.0–36.0)
MCV: 91.7 fL (ref 80.0–100.0)
MPV: 10 fL (ref 7.5–12.5)
Monocytes Relative: 9 %
Neutro Abs: 4064 cells/uL (ref 1500–7800)
Neutrophils Relative %: 52.1 %
Platelets: 137 10*3/uL — ABNORMAL LOW (ref 140–400)
RBC: 4.7 10*6/uL (ref 3.80–5.10)
RDW: 12.2 % (ref 11.0–15.0)
Total Lymphocyte: 35.4 %
WBC: 7.8 10*3/uL (ref 3.8–10.8)

## 2022-10-27 LAB — TSH: TSH: 0.66 mIU/L (ref 0.40–4.50)

## 2022-11-07 NOTE — Progress Notes (Deleted)
Assessment/Plan:   1.  Essential Tremor.  -This is evidenced by the symmetrical nature and longstanding hx of gradually getting worse.  We discussed nature and pathophysiology.  We discussed that this can continue to gradually get worse with time.  We discussed that some medications can worsen this, as can caffeine use.  We discussed medication therapy as well as surgical therapy.  We discussed that the top two medications for tremor are propranolol and primidone.  She cannot take propranolol because she is already on a beta-blocker (metoprolol) and she cannot take primidone because it interacts with her Eliquis.  2.  Breast cancer, diagnosed December, 2023  -Following with oncology.  3.  A-fib  -On Eliquis Subjective:   Deborah Randall was seen in consultation in the movement disorder clinic at the request of Ngetich, Dinah C, NP.  The evaluation is for tremor in hands and head.  Tremor started approximately *** ago and involves the ***.  Tremor is most noticeable when ***.   There is *** family hx of tremor.    Affected by caffeine:  {yes no:314532} Affected by alcohol:  {yes no:314532} Affected by stress:  {yes no:314532} Affected by fatigue:  {yes no:314532} Spills soup if on spoon:  {yes no:314532} Spills glass of liquid if full:  {yes no:314532} Affects ADL's (tying shoes, brushing teeth, etc):  {yes no:314532}  Current/Previously tried tremor medications: ***xanax; on gabapentin; on metoprolol  Current medications that may exacerbate tremor:  albuterol  Outside reports reviewed: {Outside review:15817}.  No Known Allergies  No outpatient medications have been marked as taking for the 11/08/22 encounter (Appointment) with Juanisha Bautch, Octaviano Batty, DO.      Objective:   VITALS:  There were no vitals filed for this visit. Gen:  Appears stated age and in NAD. HEENT:  Normocephalic, atraumatic. The mucous membranes are moist. The superficial temporal arteries are without ropiness or  tenderness. Cardiovascular: Regular rate and rhythm. Lungs: Clear to auscultation bilaterally. Neck: There are no carotid bruits noted bilaterally.  NEUROLOGICAL:  Orientation:  The patient is alert and oriented x 3.   Cranial nerves: There is good facial symmetry. Extraocular muscles are intact and visual fields are full to confrontational testing. Speech is fluent and clear. Soft palate rises symmetrically and there is no tongue deviation. Hearing is intact to conversational tone. Tone: Tone is good throughout. Sensation: Sensation is intact to light touch touch throughout (facial, trunk, extremities). Vibration is intact at the bilateral big toe. There is no extinction with double simultaneous stimulation. There is no sensory dermatomal level identified. Coordination:  The patient has no dysdiadichokinesia or dysmetria. Motor: Strength is 5/5 in the bilateral upper and lower extremities.  Shoulder shrug is equal bilaterally.  There is no pronator drift.  There are no fasciculations noted. DTR's: Deep tendon reflexes are 2/4 at the bilateral biceps, triceps, brachioradialis, patella and achilles.  Plantar responses are downgoing bilaterally. Gait and Station: The patient is able to ambulate without difficulty. The patient is able to heel toe walk without any difficulty. The patient is able to ambulate in a tandem fashion. The patient is able to stand in the Romberg position.   MOVEMENT EXAM: Tremor:  There is *** tremor in the UE, noted most significantly with action.  The patient is *** able to draw Archimedes spirals without significant difficulty.  There is *** tremor at rest.  The patient is *** able to pour water from one glass to another without spilling it.  I have reviewed  and interpreted the following labs independently   Chemistry      Component Value Date/Time   NA 144 10/26/2022 1102   NA 142 11/22/2015 0858   NA 132 (L) 03/19/2012 1246   K 3.7 10/26/2022 1102   K 4.1  03/19/2012 1246   CL 103 10/26/2022 1102   CL 98 03/19/2012 1246   CO2 31 10/26/2022 1102   CO2 30 (H) 03/19/2012 1246   BUN 14 10/26/2022 1102   BUN 22 11/22/2015 0858   BUN 10.0 03/19/2012 1246   CREATININE 1.47 (H) 10/26/2022 1102   CREATININE 0.9 03/19/2012 1246      Component Value Date/Time   CALCIUM 9.0 10/26/2022 1102   CALCIUM 9.4 03/19/2012 1246   ALKPHOS 82 01/14/2021 1142   ALKPHOS 82 03/19/2012 1246   AST 18 10/26/2022 1102   AST 18 03/19/2012 1246   ALT 10 10/26/2022 1102   ALT 13 03/19/2012 1246   BILITOT 0.6 10/26/2022 1102   BILITOT 0.5 11/22/2015 0858   BILITOT 0.51 03/19/2012 1246      Lab Results  Component Value Date   WBC 7.8 10/26/2022   HGB 13.9 10/26/2022   HCT 43.1 10/26/2022   MCV 91.7 10/26/2022   PLT 137 (L) 10/26/2022   Lab Results  Component Value Date   TSH 0.66 10/26/2022      Total time spent on today's visit was ***60 minutes, including both face-to-face time and nonface-to-face time.  Time included that spent on review of records (prior notes available to me/labs/imaging if pertinent), discussing treatment and goals, answering patient's questions and coordinating care.  CC:  Ngetich, Donalee Citrin, NP

## 2022-11-08 ENCOUNTER — Ambulatory Visit: Payer: Medicare PPO | Admitting: Neurology

## 2022-11-17 ENCOUNTER — Other Ambulatory Visit: Payer: Self-pay

## 2022-11-17 DIAGNOSIS — M545 Low back pain, unspecified: Secondary | ICD-10-CM

## 2022-11-17 MED ORDER — GABAPENTIN 100 MG PO CAPS: ORAL_CAPSULE | ORAL | 1 refills | Status: DC

## 2022-11-28 ENCOUNTER — Other Ambulatory Visit: Payer: Self-pay

## 2022-11-28 DIAGNOSIS — F3289 Other specified depressive episodes: Secondary | ICD-10-CM

## 2022-11-28 DIAGNOSIS — C50411 Malignant neoplasm of upper-outer quadrant of right female breast: Secondary | ICD-10-CM | POA: Diagnosis not present

## 2022-11-28 DIAGNOSIS — C50912 Malignant neoplasm of unspecified site of left female breast: Secondary | ICD-10-CM | POA: Diagnosis not present

## 2022-11-28 MED ORDER — SERTRALINE HCL 25 MG PO TABS: 50.0000 mg | ORAL_TABLET | Freq: Every day | ORAL | 1 refills | Status: DC

## 2022-11-28 NOTE — Telephone Encounter (Signed)
High warnings came up when trying to refill medication.  Medication pended and sent to Richarda Blade, NP

## 2022-12-05 DIAGNOSIS — L821 Other seborrheic keratosis: Secondary | ICD-10-CM | POA: Diagnosis not present

## 2022-12-05 DIAGNOSIS — D2262 Melanocytic nevi of left upper limb, including shoulder: Secondary | ICD-10-CM | POA: Diagnosis not present

## 2022-12-05 DIAGNOSIS — D225 Melanocytic nevi of trunk: Secondary | ICD-10-CM | POA: Diagnosis not present

## 2022-12-05 DIAGNOSIS — Z85828 Personal history of other malignant neoplasm of skin: Secondary | ICD-10-CM | POA: Diagnosis not present

## 2022-12-05 DIAGNOSIS — D2261 Melanocytic nevi of right upper limb, including shoulder: Secondary | ICD-10-CM | POA: Diagnosis not present

## 2022-12-13 NOTE — Progress Notes (Signed)
Assessment/Plan:   1.  Cervical Dystonia  -I talked to the patient about the nature and pathophysiology.   The primary muscles involved are the left sternocleidomastoid and right splenius capitis.  We talked about treatments.  We talked about the value of botox.  The patient was educated on the botulinum toxin the black blox warning and given a copy of the botox patient medication guide.  The patient understands that this warning states that there have been reported cases of the Botox extending beyond the injection site and creating adverse effects, similar to those of botulism. This included loss of strength, trouble walking, hoarseness, trouble saying words clearly, loss of bladder control, trouble breathing, trouble swallowing, diplopia, blurry vision and ptosis. Most of the distant spread of Botox was happening in patients, primarily children, who received medication for spasticity or for cervical dystonia.  She is not really interested in Botox.  Patient wanted reassurance that she does not have Parkinson's disease, which she does not.  -Literature was given to the patient.  -Discussed that if she wanted Botox in the future, we would need permission to hold the Eliquis for a few days prior to injections.  -Did not see hand tremor today   2.  Breast cancer, diagnosed December, 2023  -Following with oncology.  3.  A-fib  -On Eliquis  4.  Follow-up as needed.  Subjective:   Deborah Randall was seen in consultation in the movement disorder clinic at the request of Ngetich, Dinah C, NP.  The evaluation is for tremor in the hands and head. Pt with son who supplements hx.   Tremor started approximately 1 year ago and started in the head but now involves the L hand as well.  Tremor is most noticeable when relaxed and watching TV and she can then feel the head tremor.  No neck pain.  She has no trouble turning the head/neck.     There is no family hx of tremor.    She is L hand dominant.    Affected  by caffeine:  No. (drinks pepsi) Affected by alcohol:  doesn't drink any Affected by stress:  unsure Affected by fatigue:  Yes.   Spills soup if on spoon:  No. - " I have no problems eating" Spills glass of liquid if full:  No. Affects ADL's (tying shoes, brushing teeth, etc):  No.  Current/Previously tried tremor medications: xanax (uses few times/week); on gabapentin 100 mg bid for rib pain; on metoprolol  Current medications that may exacerbate tremor:  Albuterol (almost never uses it)  Outside reports reviewed: lab reports, office notes, and referral letter/letters.  No Known Allergies  Current Meds  Medication Sig   acetaminophen (TYLENOL) 325 MG tablet Take 2 tablets (650 mg total) by mouth every 4 (four) hours as needed for headache or mild pain.   albuterol (VENTOLIN HFA) 108 (90 Base) MCG/ACT inhaler Inhale 1 puff into the lungs every 6 (six) hours as needed for shortness of breath.   ALPRAZolam (XANAX) 0.5 MG tablet TAKE 1 TABLET BY MOUTH DAILY FOR ANXIETY (Patient taking differently: Take 0.5 mg by mouth daily as needed for anxiety.)   calcium citrate (CALCITRATE - DOSED IN MG ELEMENTAL CALCIUM) 950 (200 Ca) MG tablet Take 200 mg of elemental calcium by mouth daily.   Cholecalciferol (VITAMIN D3) 2000 UNITS TABS Take 2,000 Units by mouth daily.   docusate sodium (COLACE) 100 MG capsule Take 1 capsule (100 mg total) by mouth daily.   ELIQUIS 5 MG  TABS tablet TAKE 1 TABLET BY MOUTH TWICE DAILY   furosemide (LASIX) 40 MG tablet TAKE 1 TABLET BY MOUTH DAILY   gabapentin (NEURONTIN) 100 MG capsule Take 1 capsule (100 mg total) by mouth 2 (two) times daily.   ibuprofen (ADVIL) 200 MG tablet Take 200 mg by mouth daily as needed for headache or mild pain.   Iron, Ferrous Sulfate, 325 (65 Fe) MG TABS Take 325 mg by mouth daily.   levothyroxine (SYNTHROID) 137 MCG tablet TAKE 1 TABLET BY MOUTH DAILY   meclizine (ANTIVERT) 12.5 MG tablet Take 12.5 mg by mouth as needed for dizziness.    metoprolol tartrate (LOPRESSOR) 50 MG tablet TAKE 1 TABLET BY MOUTH TWICE DAILY   potassium chloride SA (KLOR-CON M) 20 MEQ tablet Take 2 tablets (40 mEq total) by mouth daily.   rosuvastatin (CRESTOR) 10 MG tablet TAKE 1 TABLET BY MOUTH DAILY   sertraline (ZOLOFT) 25 MG tablet Take 2 tablets (50 mg total) by mouth daily.   tamoxifen (NOLVADEX) 20 MG tablet Take 1 tablet (20 mg total) by mouth daily.      Objective:   VITALS:   Vitals:   12/18/22 0904  BP: 136/76  Pulse: 80  SpO2: 98%  Weight: 135 lb 12.8 oz (61.6 kg)   Gen:  Appears stated age and in NAD. HEENT:  Normocephalic, atraumatic. The mucous membranes are moist. The superficial temporal arteries are without ropiness or tenderness. Cardiovascular: Regular rate and rhythm. Lungs: Clear to auscultation bilaterally. Neck: There are no carotid bruits noted bilaterally.  NEUROLOGICAL:  Orientation:  The patient is alert and oriented x 3.   Cranial nerves: There is good facial symmetry. Extraocular muscles are intact and visual fields are full to confrontational testing. Speech is fluent and clear. Soft palate rises symmetrically and there is no tongue deviation. Hearing is intact to conversational tone. Tone: Tone is good throughout. Sensation: Sensation is intact to light touch touch throughout (facial, trunk, extremities). Vibration is intact at the bilateral big toe. There is no extinction with double simultaneous stimulation. There is no sensory dermatomal level identified. Coordination:  The patient has no decremation with any form of RAMS, including alternating supination and pronation of the forearm, hand opening and closing, finger taps, heel taps and toe taps.  Motor: Strength is 5/5 in the bilateral upper and lower extremities.  Shoulder shrug is equal bilaterally.  There is no pronator drift.  There are no fasciculations noted. DTR's: Deep tendon reflexes are 1/4 at the bilateral biceps, triceps, brachioradialis,  patella and achilles.  Plantar responses are downgoing bilaterally. Gait and Station: The patient ambulates with her cane.  She does fairly well with a cane.  She does not shuffle.  MOVEMENT EXAM: Tremor:  There is no rest tremor.  There is no significant postural tremor.  There is head tremor in the "no" direction.  Head slightly to the R.   She has no significant trouble with Archimedes spirals on either side.   I have reviewed and interpreted the following labs independently   Chemistry      Component Value Date/Time   NA 144 10/26/2022 1102   NA 142 11/22/2015 0858   NA 132 (L) 03/19/2012 1246   K 3.7 10/26/2022 1102   K 4.1 03/19/2012 1246   CL 103 10/26/2022 1102   CL 98 03/19/2012 1246   CO2 31 10/26/2022 1102   CO2 30 (H) 03/19/2012 1246   BUN 14 10/26/2022 1102   BUN 22 11/22/2015 0858  BUN 10.0 03/19/2012 1246   CREATININE 1.47 (H) 10/26/2022 1102   CREATININE 0.9 03/19/2012 1246      Component Value Date/Time   CALCIUM 9.0 10/26/2022 1102   CALCIUM 9.4 03/19/2012 1246   ALKPHOS 82 01/14/2021 1142   ALKPHOS 82 03/19/2012 1246   AST 18 10/26/2022 1102   AST 18 03/19/2012 1246   ALT 10 10/26/2022 1102   ALT 13 03/19/2012 1246   BILITOT 0.6 10/26/2022 1102   BILITOT 0.5 11/22/2015 0858   BILITOT 0.51 03/19/2012 1246      Lab Results  Component Value Date   WBC 7.8 10/26/2022   HGB 13.9 10/26/2022   HCT 43.1 10/26/2022   MCV 91.7 10/26/2022   PLT 137 (L) 10/26/2022   Lab Results  Component Value Date   TSH 0.66 10/26/2022      Total time spent on today's visit was 45 minutes, including both face-to-face time and nonface-to-face time.  Time included that spent on review of records (prior notes available to me/labs/imaging if pertinent), discussing treatment and goals, answering patient's questions and coordinating care.  CC:  Ngetich, Donalee Citrin, NP

## 2022-12-18 ENCOUNTER — Ambulatory Visit: Payer: Medicare PPO | Admitting: Neurology

## 2022-12-18 ENCOUNTER — Encounter: Payer: Self-pay | Admitting: Neurology

## 2022-12-18 VITALS — BP 136/76 | HR 80 | Wt 135.8 lb

## 2022-12-18 DIAGNOSIS — G243 Spasmodic torticollis: Secondary | ICD-10-CM | POA: Diagnosis not present

## 2022-12-18 NOTE — Patient Instructions (Signed)
We discussed that you have cervical dystonia.  We discussed botox but you decided to hold for now, which is reasonable.    The physicians and staff at [facility] are committed to providing excellent care. You may receive a survey requesting feedback about your experience at our office. We strive to receive "very good" responses to the survey questions. If you feel that your experience would prevent you from giving the office a "very good " response, please contact our office to try to remedy the situation. We may be reached at [phone]. Thank you for taking the time out of your busy day to complete the survey.

## 2022-12-27 ENCOUNTER — Other Ambulatory Visit: Payer: Self-pay | Admitting: Family

## 2022-12-27 DIAGNOSIS — E039 Hypothyroidism, unspecified: Secondary | ICD-10-CM

## 2023-01-23 ENCOUNTER — Other Ambulatory Visit: Payer: Self-pay | Admitting: Cardiovascular Disease

## 2023-02-13 NOTE — Progress Notes (Deleted)
Cardiology Office Note:    Date:  02/13/2023   ID:  Deborah Randall, DOB 01-18-1941, MRN [id number]  PCP:  Caesar Bookman, NP   [facility] Providers Cardiologist:  Thurmon Fair, MD Cardiology APP:  Marcelino Duster, PA { Click to update primary MD,subspecialty MD or APP then REFRESH:1}    Referring MD: Ngetich, Donalee Citrin, NP   No chief complaint on file. ***  History of Present Illness:    Deborah Randall is a 82 y.o. female with a hx of persistent atrial fibrillation, hypertension, hyperlipidemia, hypothyroidism, and edema.  She has recurrent but infrequent paroxysmal atrial fibrillation with RVR initially diagnosed in 2011 and is s/p cardioversion 08/19/2015.  Nuclear stress test in 2017 negative for reversible ischemia.  She has a history of left mastectomy and radiation.  Echocardiogram 09/30/2019 with preserved LVEF of 55 to 60%, normal PA pressure, severely dilated left atria, moderately dilated right atria, mild to moderate MR, and no AS.  She was seen in clinic by Dr. Royann Shivers 08/18/2020 and was doing well at that time.  She remains active and does her own housework.  I saw her in clinic 10/19/2021 for routine follow-up.  She reported dizziness and presyncope at that time.  Orthostatic vitals in the office were negative for hypotension.  I placed an 14-day ZIO monitor and stopped amlodipine for borderline blood pressure. Heart monitor showed that her atrial fibrillation was well controlled with no significant slow or fast heart rates.  She was diagnosed with breast cancer 04/2022 and I following with oncology. She was cleared for right mastectomy in 05/2022.  She presents for routine follow up. No further dizziness after stopping amlodipine last year.       PAF Continue beta-blocker and Eliquis   Chronic anticoagulation Appropriately dosed on 5 mg Eliquis twice daily, although we will need to monitor renal function and weight as her last creatinine was 1.39 and her  weight was 67.1 kg.   Chronic anemia Recent hemoglobin stable at 10.3 No active bleeding   Hypertension Beta-blocker   Hyperlipidemia Continue 10 mg Crestor 09/28/2021: Cholesterol 109; HDL 48; LDL Cholesterol (Calc) 45; Triglycerides 81 Excellent control on recent lipid panel   Poor dentition Cleared for dental procedures - can complete more than 4.0 METS without angina          Past Medical History:  Diagnosis Date   Anxiety    Atrial fibrillation with rapid ventricular response (HCC) 07/07/2015   Bilateral breast cancer (HCC)    Daily headache    Depression    GERD (gastroesophageal reflux disease)    Hyperlipidemia    Hypertension    Hypothyroidism    Iron deficiency anemia 2000s   Lumbago    Nausea alone    PONV (postoperative nausea and vomiting)    Senile osteoporosis    Squamous cell carcinoma of forehead    "I think they burned it off"    Past Surgical History:  Procedure Laterality Date   ABDOMINAL HYSTERECTOMY  1970s   BREAST BIOPSY Bilateral    BREAST BIOPSY Right 05/18/2022   [place] RT BREAST BX W LOC DEV 1ST LESION IMG BX SPEC US GUIDE 05/18/2022 [facility]   BREAST LUMPECTOMY Right 2002   "w/lymph nodes removed"   CARDIOVERSION N/A 08/19/2015   Procedure: CARDIOVERSION;  Surgeon: Thurmon Fair, MD;  Location: [facility];  Service: Cardiovascular;  Laterality: N/A;   CATARACT EXTRACTION W/ INTRAOCULAR LENS  IMPLANT, BILATERAL Bilateral 2000s   DILATION  AND CURETTAGE OF UTERUS  ~ 1968   ELBOW FRACTURE SURGERY Left 2011   "has 2 screws in it"   EXCISIONAL HEMORRHOIDECTOMY  ~ 1970   FRACTURE SURGERY  02/12/2016   Right Wrist   HEMIARTHROPLASTY HIP Left 2011   "had ball replaced"   INGUINAL HERNIA REPAIR Right 1980s?   MASTECTOMY Left 2005   MASTECTOMY Right 06/2022   ORIF WRIST FRACTURE Left 02/12/2016   Procedure: OPEN REDUCTION INTERNAL FIXATION LEFT DISTAL RADIUS;  Surgeon: Knute Neu, MD;  Location: [facility] OR;  Service:  Plastics;  Laterality: Left;   SIMPLE MASTECTOMY WITH AXILLARY SENTINEL NODE BIOPSY Right 06/22/2022   Procedure: RIGHT SIMPLE MASTECTOMY;  Surgeon: Almond Lint, MD;  Location: [facility] OR;  Service: General;  Laterality: Right;   TOTAL THYROIDECTOMY  1972   WRIST FRACTURE SURGERY Right    "has a plate and 7 screws in there"    Current Medications: No outpatient medications have been marked as taking for the 02/15/23 encounter (Appointment) with Marcelino Duster, PA.     Allergies:   Patient has no known allergies.   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   Tobacco Use   Smoking status: Never   Smokeless tobacco: Never  Vaping Use   Vaping status: Never Used  Substance and Sexual Activity   Alcohol use: No   Drug use: No   Sexual activity: Never  Other Topics Concern   Not on file  Social History Narrative   Lives alone   Widow   2 sons , one son decease, one son disable    Works at [employer] in [employer]   Enjoys going places with friends   Never smoked   Alcohol none   Exercise -walk, works in yard    Left handed    Social Determinants of Health   Financial Resource Strain: Low Risk  (05/16/2017)   Overall Financial Resource Strain (CARDIA)    Difficulty of Paying Living Expenses: Not hard at all  Food Insecurity: No Food Insecurity (06/20/2022)   Hunger Vital Sign    Worried About Running Out of Food in the Last Year: Never true    Ran Out of Food in the Last Year: Never true  Transportation Needs: No Transportation Needs (06/20/2022)   PRAPARE - Administrator, Civil Service (Medical): No    Lack of Transportation (Non-Medical): No  Physical Activity: Inactive (05/16/2017)   Exercise Vital Sign    Days of  Exercise per Week: 0 days    Minutes of Exercise per Session: 0 min  Stress: No Stress Concern Present (05/16/2017)   [employer] of Occupational Health - Occupational Stress Questionnaire    Feeling of Stress : Not at all  Social Connections: Moderately Integrated (05/16/2017)   Social Connection and Isolation Panel [NHANES]    Frequency of Communication with Friends and Family: More than three times a week    Frequency of Social Gatherings with Friends and Family: Three times a week    Attends Religious Services: 1 to 4 times per year    Active Member of Clubs or Organizations: Yes    Attends Banker Meetings: Never    Marital Status: Widowed  Family History: The patient's ***family history includes Alzheimer's disease in her brother; Bladder Cancer in her brother; COPD in her brother; Cancer in her father; Diabetes in her brother; Heart attack in her brother; Heart disease in her sister; Hypertension in her sister; Lung cancer in her sister; Mental illness in her son and son; Other in her brother, brother, and brother; Ovarian cancer in her sister; Skin cancer in her brother.  ROS:   Please see the history of present illness.    *** All other systems reviewed and are negative.  EKGs/Labs/Other Studies Reviewed:    The following studies were reviewed today: ***      Recent Labs: 10/26/2022: ALT 10; BUN 14; Creat 1.47; Hemoglobin 13.9; Platelets 137; Potassium 3.7; Sodium 144; TSH 0.66  Recent Lipid Panel    Component Value Date/Time   CHOL 142 10/26/2022 1102   CHOL 155 11/22/2015 0858   TRIG 148 10/26/2022 1102   HDL 47 (L) 10/26/2022 1102   HDL 46 11/22/2015 0858   CHOLHDL 3.0 10/26/2022 1102   VLDL 17 07/08/2015 0535   LDLCALC 72 10/26/2022 1102     Risk Assessment/Calculations:   {Does this patient have ATRIAL FIBRILLATION?:[id number]}  No BP recorded.  {Refresh Note OR Click here to enter BP  :1}***         Physical Exam:    VS:  There  were no vitals taken for this visit.    Wt Readings from Last 3 Encounters:  12/18/22 135 lb 12.8 oz (61.6 kg)  10/26/22 133 lb (60.3 kg)  10/20/22 132 lb 8 oz (60.1 kg)     GEN: *** Well nourished, well developed in no acute distress HEENT: Normal NECK: No JVD; No carotid bruits LYMPHATICS: No lymphadenopathy CARDIAC: ***RRR, no murmurs, rubs, gallops RESPIRATORY:  Clear to auscultation without rales, wheezing or rhonchi  ABDOMEN: Soft, non-tender, non-distended MUSCULOSKELETAL:  No edema; No deformity  SKIN: Warm and dry NEUROLOGIC:  Alert and oriented x 3 PSYCHIATRIC:  Normal affect   ASSESSMENT:    No diagnosis found. PLAN:    In order of problems listed above:  ***      {Are you ordering a CV Procedure (e.g. stress test, cath, DCCV, TEE, etc)?   Press F2        :[id number]}    Medication Adjustments/Labs and Tests Ordered: Current medicines are reviewed at length with the patient today.  Concerns regarding medicines are outlined above.  No orders of the defined types were placed in this encounter.  No orders of the defined types were placed in this encounter.   There are no Patient Instructions on file for this visit.   Signed, Marcelino Duster, [place]  02/13/2023 9:24 PM    [facility]

## 2023-02-15 ENCOUNTER — Ambulatory Visit: Payer: Medicare PPO | Admitting: Physician Assistant

## 2023-02-22 ENCOUNTER — Other Ambulatory Visit: Payer: Self-pay | Admitting: Cardiovascular Disease

## 2023-02-22 DIAGNOSIS — I4819 Other persistent atrial fibrillation: Secondary | ICD-10-CM

## 2023-02-26 DIAGNOSIS — Z853 Personal history of malignant neoplasm of breast: Secondary | ICD-10-CM | POA: Diagnosis not present

## 2023-02-26 DIAGNOSIS — C50411 Malignant neoplasm of upper-outer quadrant of right female breast: Secondary | ICD-10-CM | POA: Diagnosis not present

## 2023-02-26 DIAGNOSIS — Z170 Estrogen receptor positive status [ER+]: Secondary | ICD-10-CM | POA: Diagnosis not present

## 2023-02-27 ENCOUNTER — Other Ambulatory Visit: Payer: Self-pay | Admitting: Family

## 2023-02-27 DIAGNOSIS — I509 Heart failure, unspecified: Secondary | ICD-10-CM

## 2023-02-27 DIAGNOSIS — R0609 Other forms of dyspnea: Secondary | ICD-10-CM

## 2023-03-16 ENCOUNTER — Telehealth: Payer: Self-pay | Admitting: Hematology and Oncology

## 2023-03-16 NOTE — Telephone Encounter (Signed)
Patient is aware of rescheduled appointment times/dates 

## 2023-03-18 NOTE — Progress Notes (Unsigned)
Cardiology Office Note:  .   Date:  03/20/2023  ID:  Deborah Randall, DOB [birth year], MRN [id number] PCP: Caesar Bookman, NP  [facility] Providers Cardiologist:  Thurmon Fair, MD Cardiology APP:  Marcelino Duster, PA    History of Present Illness: [name]   Deborah Randall is a 82 y.o. female with a past medical history of persistent atrial fibrillation, HTN, HLD, hypothyroidism. Patient is followed by Dr. Royann Shivers and presents today for an overdue follow up   Per chart review, patient was first diagnosed with atrial fibrillation in 2011. Underwent cardioversion in 2017. Echocardiogram at that time showed EF 60-65%, moderate MR, mild TR. Nuclear stress test in 2017 was a normal study without ischemia or infarction. Echocardiogram in 09/2019 showed EF 55-60%, no regional wall motion abnormalities, normal RV function, normal PA systolic pressure, mild-moderate MR, mild-moderate TR.   Patient was seen in clinic in 09/2021 for routine follow up. At that time, patient complained of presyncope. Amlodipine was decreased. She wore a cardiac monitor that showed persistent atrial fibrillation (100% burden) with well controlled ventricular rates. Patient has not been seen since that time   Today, patient reports that she has been having discomfort in their chest and shortness of on exertion. Notices symptoms when walking in a grocery store or to their mailbox. This discomfort, which they struggle to describe, forces them to stop their activity and rest. Denies ever having the chest discomfort or shortness of breath while at rest. Symptoms have always been relieved by rest. These symptoms have been ongoing for several months and are starting to limit her daily activities. She has started to notice the chest discomfort and shortness of breath on lower levels of exertion than before. She denies palpitations, ankle edema, syncope, near syncope  The patient is on a regimen of Eliquis, Lasix, Metoprolol, a  potassium supplement, and Crestor. They deny any issues with bleeding on eliquis. They also mention a history of a broken hip, for which a rod was inserted. They occasionally experience severe pain in their knee, which they attribute to the rod. Currently no plans for repeat surgery   ROS: Per HPI, otherwise negative   Studies Reviewed: .   Cardiac Studies & Procedures     STRESS TESTS  MYOCARDIAL PERFUSION IMAGING 07/15/2015  Interpretation Summary  The left ventricular ejection fraction is normal (55-65%).  Nuclear stress EF: 57%.  There was no ST segment deviation noted during stress.  The study is normal.  Normal stress nuclear study with no ischemia or infarction; EF 57 with normal wall motion.   ECHOCARDIOGRAM  ECHOCARDIOGRAM LIMITED 09/30/2019  Narrative ECHOCARDIOGRAM LIMITED REPORT    Patient Name:   Deborah Randall   Date of Exam: 09/30/2019 Medical Rec #:  [id number]     Height:       64.0 in Accession #:    [id number]    Weight:       172.0 lb Date of Birth:  15-Sep-1940     BSA:          1.835 m Patient Age:    78 years      BP:           120/73 mmHg Patient Gender: F             HR:           87 bpm. Exam Location:  [address]  Procedure: 2D Echo, Cardiac Doppler, Limited Color Doppler and Strain Analysis  Indications:    R06.00 Dyspnea  History:        Patient has prior history of Echocardiogram examinations, most recent 07/08/2015. CHF, Arrythmias:Atrial Fibrillation; Signs/Symptoms:Edema.  Sonographer:    Clearence Ped RCS Referring Phys: [id number] TIFFANY L REED  IMPRESSIONS   1. Left ventricular ejection fraction, by estimation, is 55 to 60%. The left ventricle has normal function. The left ventricle has no regional wall motion abnormalities. Left ventricular diastolic function could not be evaluated. 2. Right ventricular systolic function is normal. The right ventricular size is normal. There is normal pulmonary artery systolic pressure. The estimated  right ventricular systolic pressure is 27.2 mmHg. 3. Left atrial size was severely dilated. 4. Right atrial size was moderately dilated. 5. The mitral valve is grossly normal. Mild to moderate mitral valve regurgitation. No evidence of mitral stenosis. 6. Tricuspid valve regurgitation is mild to moderate. 7. The aortic valve is tricuspid. Aortic valve regurgitation is not visualized. Mild to moderate aortic valve sclerosis/calcification is present, without any evidence of aortic stenosis. 8. The inferior vena cava is normal in size with greater than 50% respiratory variability, suggesting right atrial pressure of 3 mmHg.  FINDINGS Left Ventricle: Left ventricular ejection fraction, by estimation, is 55 to 60%. The left ventricle has normal function. The left ventricle has no regional wall motion abnormalities. The left ventricular internal cavity size was normal in size. There is no left ventricular hypertrophy. Left ventricular diastolic function could not be evaluated due to atrial fibrillation.  Right Ventricle: The right ventricular size is normal. No increase in right ventricular wall thickness. Right ventricular systolic function is normal. There is normal pulmonary artery systolic pressure. The tricuspid regurgitant velocity is 2.46 m/s, and with an assumed right atrial pressure of 3 mmHg, the estimated right ventricular systolic pressure is 27.2 mmHg.  Left Atrium: Left atrial size was severely dilated.  Right Atrium: Right atrial size was moderately dilated.  Pericardium: A small pericardial effusion is present. The pericardial effusion is posterior to the left ventricle. Presence of pericardial fat pad.  Mitral Valve: The mitral valve is grossly normal. There is mild thickening of the mitral valve leaflet(s). Mild mitral annular calcification. Mild to moderate mitral valve regurgitation. No evidence of mitral valve stenosis.  Tricuspid Valve: The tricuspid valve is grossly normal.  Tricuspid valve regurgitation is mild to moderate. No evidence of tricuspid stenosis.  Aortic Valve: The aortic valve is tricuspid. . There is moderate thickening and moderate calcification of the aortic valve. Aortic valve regurgitation is not visualized. Mild to moderate aortic valve sclerosis/calcification is present, without any evidence of aortic stenosis. There is moderate thickening of the aortic valve. There is moderate calcification of the aortic valve.  Pulmonic Valve: The pulmonic valve was grossly normal. Pulmonic valve regurgitation is not visualized. No evidence of pulmonic stenosis.  Aorta: The aortic root and ascending aorta are structurally normal, with no evidence of dilitation.  Venous: The inferior vena cava is normal in size with greater than 50% respiratory variability, suggesting right atrial pressure of 3 mmHg.  EKG: Rhythm strip during this exam demostrated atrial fibrillation.   LEFT VENTRICLE PLAX 2D LVIDd:         4.56 cm Diastology LVIDs:         3.43 cm LV e' lateral:   7.72 cm/s LV PW:         1.22 cm LV E/e' lateral: 16.8 LV IVS:        0.93 cm LV e' medial:  6.42 cm/s LV E/e' medial:  20.1   RIGHT VENTRICLE RV Basal diam:  3.18 cm RVSP:           27.2 mmHg  LEFT ATRIUM             Index       RIGHT ATRIUM           Index LA diam:        3.70 cm 2.02 cm/m  RA Pressure: 3.00 mmHg LA Vol (A2C):   46.6 ml 25.40 ml/m RA Area:     18.30 cm LA Vol (A4C):   56.9 ml 31.01 ml/m RA Volume:   50.70 ml  27.63 ml/m LA Biplane Vol: 52.2 ml 28.45 ml/m  AORTA Ao Root diam: 2.80 cm  MITRAL VALVE                 TRICUSPID VALVE MV Area (PHT):               TR Peak grad:   24.2 mmHg MV Decel Time: 148 msec      TR Vmax:        246.00 cm/s MR Peak grad:    108.6 mmHg  Estimated RAP:  3.00 mmHg MR Mean grad:    72.0 mmHg   RVSP:           27.2 mmHg MR Vmax:         521.00 cm/s MR Vmean:        393.0 cm/s MR PISA:         1.57 cm MR PISA Eff ROA: 9  mm MR PISA Radius:  0.50 cm MV E velocity: 129.33 cm/s  Lennie Odor MD Electronically signed by Lennie Odor MD Signature Date/Time: 09/30/2019/10:41:57 AM    Final    MONITORS  LONG TERM MONITOR (3-14 DAYS) 11/18/2021  Narrative  The dominant rhythm is atrial fibrillation with mostly well controlled ventricular rates. Episodes of mild slow ventricular rates are seen at night and mild rapid ventricular response is seen during activity.  Persistent atrial fibrillation with well-controlled ventricular rates.   Patch Wear Time:  14 days and 0 hours (2023-06-06T12:31:41-0400 to 2023-06-20T12:31:45-0400)  Atrial Fibrillation occurred continuously (100% burden), ranging from 46-140 bpm (avg of 75 bpm). Isolated VEs were rare (<1.0%), VE Couplets were rare (<1.0%), and no VE Triplets were present.           Risk Assessment/Calculations:    CHA2DS2-VASc Score = 5   This indicates a 7.2% annual risk of stroke. The patient's score is based upon: CHF History: 0 HTN History: 1 Diabetes History: 0 Stroke History: 0 Vascular Disease History: 1 Age Score: 2 Gender Score: 1        Physical Exam:   VS:  BP 122/78 (BP Location: Left Arm, Patient Position: Sitting, Cuff Size: Normal)   Pulse 67   Ht 5\' 4"  (1.626 m)   Wt 133 lb (60.3 kg)   SpO2 99%   BMI 22.83 kg/m    Wt Readings from Last 3 Encounters:  03/20/23 133 lb (60.3 kg)  12/18/22 135 lb 12.8 oz (61.6 kg)  10/26/22 133 lb (60.3 kg)    GEN: Well nourished, well developed in no acute distress. Sitting comfortably in the chair  NECK: No JVD; No carotid bruits CARDIAC: Irregular rate and rhythm. no murmurs, rubs, gallops. Radial pulses 2+ bilaterally  RESPIRATORY:  Clear to auscultation without rales, wheezing or rhonchi. Normal work of breathing on room air  ABDOMEN: Soft,  non-tender, non-distended EXTREMITIES:  No edema in BLE; No deformity   ASSESSMENT AND PLAN: .    Chest Discomfort  Dyspnea on exertion -  Patient reports that she has been having episodes of chest discomfort and shortness of breath on exertion for the past few months. Occurs when she is walking to get her mail or grocery shopping. Episodes have been occurring more frequently and are limiting her activities. Symptoms are always relieved by rest   - EKG nonischemic. Most recent ischemic evaluation was a stress test in 2017 that was normal and low risk  - Ordered nuclear stress test. Patient is unable to walk on treadmill, will need lexiscan stress.  - Euvolemic on exam. Ordered echocardiogram   Permanent Atrial Fibrillation - Patient previously wore a cardiac monitor in 10/2021 that showed a 100% afib burden with well controlled rates - Denies palpitations or elevated HR at home. EKG today shows afib with HR 67 BPM  - Continue metoprolol tartrate 50 mg BID  - Continue eliquis 5 mg BID. Denies bleeding on eliquis  - Ordered BMP to assess renal function- when last checked, creatinine was 1.47. If creatinine >1.5, will reduced eliquis dose to 2.5 mg BID   HTN  - BP well controlled. Denies symptoms of orthostatic hypotension  - Continue lasix 40 mg daily, metoprolol tartrate 50 mg BID - Ordered BMP   HLD  - Lipid panel from 10/2022 showed LDL 72, HDL 47, triglycerides 148, total cholesterol 142  - Continue crestor 10 mg daily   Mild-moderate MR  Mild-moderate TR  - Echocardiogram from 09/2019 showed EF 55-60%, no wall motion abnormalities, normal RV function, mild-moderate MR, mild-moderate TR.  - She is euvolemic on exam  - Continue lasix 40 mg daily and K supplementation  - Ordered repeat echo for monitoring   Informed Consent   Shared Decision Making/Informed Consent{  The risks [chest pain, shortness of breath, cardiac arrhythmias, dizziness, blood pressure fluctuations, myocardial infarction, stroke/transient ischemic attack, nausea, vomiting, allergic reaction, radiation exposure, metallic taste sensation and  life-threatening complications (estimated to be 1 in 10,000)], benefits (risk stratification, diagnosing coronary artery disease, treatment guidance) and alternatives of a nuclear stress test were discussed in detail with Ms. Fill and she agrees to proceed.     Dispo: Follow up 6-8 weeks   Signed, Jonita Albee, PA-C

## 2023-03-20 ENCOUNTER — Ambulatory Visit: Payer: Medicare PPO | Attending: Physician Assistant | Admitting: Cardiology

## 2023-03-20 ENCOUNTER — Encounter: Payer: Self-pay | Admitting: Cardiology

## 2023-03-20 VITALS — BP 122/78 | HR 67 | Ht 64.0 in | Wt 133.0 lb

## 2023-03-20 DIAGNOSIS — R0602 Shortness of breath: Secondary | ICD-10-CM

## 2023-03-20 DIAGNOSIS — E7800 Pure hypercholesterolemia, unspecified: Secondary | ICD-10-CM

## 2023-03-20 DIAGNOSIS — Z7901 Long term (current) use of anticoagulants: Secondary | ICD-10-CM

## 2023-03-20 DIAGNOSIS — I4819 Other persistent atrial fibrillation: Secondary | ICD-10-CM

## 2023-03-20 DIAGNOSIS — R0789 Other chest pain: Secondary | ICD-10-CM

## 2023-03-20 DIAGNOSIS — I10 Essential (primary) hypertension: Secondary | ICD-10-CM

## 2023-03-20 NOTE — Patient Instructions (Signed)
Medication Instructions:  No changes *If you need a refill on your cardiac medications before your next appointment, please call your pharmacy*   Lab Work: Today we will draw BMP If you have labs (blood work) drawn today and your tests are completely normal, you will receive your results only by: MyChart Message (if you have MyChart) OR A paper copy in the mail If you have any lab test that is abnormal or we need to change your treatment, we will call you to review the results.   Testing/Procedures:  Your physician has requested that you have an echocardiogram. Echocardiography is a painless test that uses sound waves to create images of your heart. It provides your doctor with information about the size and shape of your heart and how well your heart's chambers and valves are working. This procedure takes approximately one hour. There are no restrictions for this procedure. Please do NOT wear cologne, perfume, aftershave, or lotions (deodorant is allowed). Please arrive 15 minutes prior to your appointment time.  Robet Leu PA-C has ordered a Lexiscan Myocardial Perfusion Imaging Study.  Please arrive 15 minutes prior to your appointment time for registration and insurance purposes.   The test will take approximately 3 to 4 hours to complete; you may bring reading material.  If someone comes with you to your appointment, they will need to remain in the main lobby due to limited space in the testing area. **If you are pregnant or breastfeeding, please notify the nuclear lab prior to your appointment**   How to prepare for your Myocardial Perfusion Test: Do not eat or drink 3 hours prior to your test, except you may have water. Do not consume products containing caffeine (regular or decaffeinated) 12 hours prior to your test. (ex: coffee, chocolate, sodas, tea). Do wear comfortable clothes (no dresses or overalls) and walking shoes, tennis shoes preferred (No heels or open toe shoes  are allowed). Do NOT wear cologne, perfume, aftershave, or lotions (deodorant is allowed). If you use an inhaler, use it the AM of your test and bring it with you.  If you use a nebulizer, use it the AM of your test.  If these instructions are not followed, your test will have to be rescheduled.    Follow-Up: At [facility], you and your health needs are our priority.  As part of our continuing mission to provide you with exceptional heart care, we have created designated Provider Care Teams.  These Care Teams include your primary Cardiologist (physician) and Advanced Practice Providers (APPs -  Physician Assistants and Nurse Practitioners) who all work together to provide you with the care you need, when you need it.  We recommend signing up for the patient portal called "MyChart".  Sign up information is provided on this After Visit Summary.  MyChart is used to connect with patients for Virtual Visits (Telemedicine).  Patients are able to view lab/test results, encounter notes, upcoming appointments, etc.  Non-urgent messages can be sent to your provider as well.   To learn more about what you can do with MyChart, go to [email or web address].    Your next appointment:   6-8 week(s)  Provider:   Any APP

## 2023-03-21 LAB — BASIC METABOLIC PANEL
BUN/Creatinine Ratio: 13 (ref 12–28)
BUN: 17 mg/dL (ref 8–27)
CO2: 29 mmol/L (ref 20–29)
Calcium: 9 mg/dL (ref 8.7–10.3)
Chloride: 100 mmol/L (ref 96–106)
Creatinine, Ser: 1.27 mg/dL — ABNORMAL HIGH (ref 0.57–1.00)
Glucose: 92 mg/dL (ref 70–99)
Potassium: 4 mmol/L (ref 3.5–5.2)
Sodium: 143 mmol/L (ref 134–144)
eGFR: 42 mL/min/{1.73_m2} — ABNORMAL LOW (ref >59)

## 2023-03-22 ENCOUNTER — Telehealth (HOSPITAL_COMMUNITY): Payer: Self-pay | Admitting: *Deleted

## 2023-03-22 ENCOUNTER — Telehealth: Payer: Self-pay

## 2023-03-22 NOTE — Telephone Encounter (Signed)
-----   Message from Jonita Albee sent at 03/21/2023  3:18 PM EDT ----- Please tell patient that her kidney function is stable on current medications. Creatinine actually improved to 1.27. Electrolytes normal. Continue current medications  Thanks KJ

## 2023-03-22 NOTE — Telephone Encounter (Signed)
Called patient advised of below they verbalized understanding.

## 2023-03-22 NOTE — Telephone Encounter (Signed)
Left detailed instructions on son Deborah Randall per DPR. Pt's mailbox full.

## 2023-03-26 ENCOUNTER — Other Ambulatory Visit: Payer: Self-pay | Admitting: Cardiovascular Disease

## 2023-03-29 ENCOUNTER — Ambulatory Visit (HOSPITAL_COMMUNITY): Payer: Medicare PPO | Attending: Cardiology

## 2023-03-29 DIAGNOSIS — R0602 Shortness of breath: Secondary | ICD-10-CM | POA: Insufficient documentation

## 2023-03-29 DIAGNOSIS — R0789 Other chest pain: Secondary | ICD-10-CM | POA: Diagnosis not present

## 2023-03-29 MED ORDER — TECHNETIUM TC 99M TETROFOSMIN IV KIT
8.7000 | PACK | Freq: Once | INTRAVENOUS | Status: AC | PRN
  Administered 2023-03-29: 8.7 via INTRAVENOUS

## 2023-03-29 MED ORDER — REGADENOSON 0.4 MG/5ML IV SOLN
0.4000 mg | Freq: Once | INTRAVENOUS | Status: AC
  Administered 2023-03-29: 0.4 mg via INTRAVENOUS

## 2023-03-29 MED ORDER — TECHNETIUM TC 99M TETROFOSMIN IV KIT
24.6000 | PACK | Freq: Once | INTRAVENOUS | Status: AC | PRN
  Administered 2023-03-29: 24.6 via INTRAVENOUS

## 2023-03-30 ENCOUNTER — Telehealth: Payer: Self-pay

## 2023-03-30 LAB — MYOCARDIAL PERFUSION IMAGING
Base ST Depression (mm): 0 mm
LV dias vol: 39 mL (ref 46–106)
LV sys vol: 12 mL
Nuc Stress EF: 70 %
Peak HR: 101 {beats}/min
Rest HR: 74 {beats}/min
Rest Nuclear Isotope Dose: 8.7 mCi
SDS: 3
SRS: 2
SSS: 5
ST Depression (mm): 0 mm
Stress Nuclear Isotope Dose: 24.6 mCi
TID: 0.95

## 2023-03-30 NOTE — Telephone Encounter (Signed)
Called patient advised of below they verbalized understanding.

## 2023-03-30 NOTE — Telephone Encounter (Signed)
-----   Message from Jonita Albee sent at 03/30/2023  1:26 PM EST ----- Please tell patient that her nuclear stress test was a normal, low risk study. No evidence of ischemia (blockages in blood vessels in the heart),  and no evidence of infarction (scar). EF (pumping function the heart) is strong at 70%.   This is good news!  Thanks,  [employer]

## 2023-04-18 ENCOUNTER — Ambulatory Visit (HOSPITAL_COMMUNITY): Payer: Medicare PPO | Attending: Cardiology

## 2023-04-18 DIAGNOSIS — R0789 Other chest pain: Secondary | ICD-10-CM | POA: Diagnosis not present

## 2023-04-18 DIAGNOSIS — R0602 Shortness of breath: Secondary | ICD-10-CM | POA: Diagnosis not present

## 2023-04-18 LAB — ECHOCARDIOGRAM COMPLETE
MV M vel: 4.42 m/s
MV Peak grad: 78.3 mm[Hg]
S' Lateral: 2.5 cm

## 2023-04-18 NOTE — Progress Notes (Unsigned)
Cardiology Office Note:    Date:  04/18/2023   ID:  EMMALYN COWDIN, DOB Feb 20, 1941, MRN [id number]  PCP:  Caesar Bookman, NP   [facility] Providers Cardiologist:  Thurmon Fair, MD Cardiology APP:  Marcelino Duster, PA { Click to update primary MD,subspecialty MD or APP then REFRESH:1}    Referring MD: Ngetich, Donalee Citrin, NP   No chief complaint on file. ***  History of Present Illness:    Deborah Randall is a 82 y.o. female with a hx of persistent atrial fibrillation, hypertension, hyperlipidemia, hypothyroidism, and edema.  She has recurrent but infrequent paroxysmal atrial fibrillation with RVR initially diagnosed in 2011 and is s/p cardioversion 08/19/2015.  Nuclear stress test in 2017 negative for reversible ischemia.  She has a history of left mastectomy and radiation.  Echocardiogram 09/30/2019 with preserved LVEF of 55 to 60%, normal PA pressure, severely dilated left atria, moderately dilated right atria, mild to moderate MR, and no AS.  She was seen in clinic by Dr. Royann Shivers 08/18/2020 and was doing well at that time.  She remains active and does her own housework.  I saw her in clinic 10/19/2021 for routine follow-up.  She reported dizziness and presyncope at that time.  Orthostatic vitals in the office were negative for hypotension.  I placed an 14-day ZIO monitor and stopped amlodipine for borderline blood pressure. Heart monitor showed that her atrial fibrillation was well controlled with no significant slow or fast heart rates.  She was diagnosed with breast cancer 04/2022 and I following with oncology. She was cleared for right mastectomy in 05/2022.  No further dizziness after stopping amlodipine last year. She saw [facility] 03/20/23 with CP and SOB. Nuclear stress test was nonischemic. Repeat echo showed preserved EF, severe BAE, mild MR, moderate TR - overall appears stable compared to study in 2021.  She presents today for follow up of these studies.        Permanent atrial fibrillation Continue beta-blocker and Eliquis Last heart monitor with controlled rates, 100% Afib burden    Chronic anticoagulation Appropriately dosed on 5 mg Eliquis twice daily, although we will need to monitor renal function and weight as her last creatinine was 1.27 and her weight was 67.1 kg --> now 60.3 kg   Chronic anemia Recent hemoglobin stable at 13.9 No active bleeding   Hypertension Beta-blocker   Hyperlipidemia Continue 10 mg Crestor 09/28/2021: Cholesterol 109; HDL 48; LDL Cholesterol (Calc) 45; Triglycerides 81 Excellent control on recent lipid panel          Past Medical History:  Diagnosis Date   Anxiety    Atrial fibrillation with rapid ventricular response (HCC) 07/07/2015   Bilateral breast cancer (HCC)    Daily headache    Depression    GERD (gastroesophageal reflux disease)    Hyperlipidemia    Hypertension    Hypothyroidism    Iron deficiency anemia 2000s   Lumbago    Nausea alone    PONV (postoperative nausea and vomiting)    Senile osteoporosis    Squamous cell carcinoma of forehead    "I think they burned it off"    Past Surgical History:  Procedure Laterality Date   ABDOMINAL HYSTERECTOMY  1970s   BREAST BIOPSY Bilateral    BREAST BIOPSY Right 05/18/2022   [place] RT BREAST BX W LOC DEV 1ST LESION IMG BX SPEC US GUIDE 05/18/2022 [facility]   BREAST LUMPECTOMY Right 2002   "w/lymph nodes removed"  CARDIOVERSION N/A 08/19/2015   Procedure: CARDIOVERSION;  Surgeon: Thurmon Fair, MD;  Location: [facility];  Service: Cardiovascular;  Laterality: N/A;   CATARACT EXTRACTION W/ INTRAOCULAR LENS  IMPLANT, BILATERAL Bilateral 2000s   DILATION AND CURETTAGE OF UTERUS  ~ 1968   ELBOW FRACTURE SURGERY Left 2011   "has 2 screws in it"   EXCISIONAL HEMORRHOIDECTOMY  ~ 1970   FRACTURE SURGERY  02/12/2016   Right Wrist   HEMIARTHROPLASTY HIP Left 2011   "had ball replaced"   INGUINAL HERNIA REPAIR  Right 1980s?   MASTECTOMY Left 2005   MASTECTOMY Right 06/2022   ORIF WRIST FRACTURE Left 02/12/2016   Procedure: OPEN REDUCTION INTERNAL FIXATION LEFT DISTAL RADIUS;  Surgeon: Knute Neu, MD;  Location: [facility];  Service: Plastics;  Laterality: Left;   SIMPLE MASTECTOMY WITH AXILLARY SENTINEL NODE BIOPSY Right 06/22/2022   Procedure: RIGHT SIMPLE MASTECTOMY;  Surgeon: Almond Lint, MD;  Location: [facility];  Service: General;  Laterality: Right;   TOTAL THYROIDECTOMY  1972   WRIST FRACTURE SURGERY Right    "has a plate and 7 screws in there"    Current Medications: No outpatient medications have been marked as taking for the 04/30/23 encounter (Appointment) with Marcelino Duster, PA.     Allergies:   Patient has no known allergies.   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   Tobacco Use   Smoking status: Never   Smokeless tobacco: Never  Vaping Use   Vaping status: Never Used  Substance and Sexual Activity   Alcohol use: No   Drug use: No   Sexual activity: Never  Other Topics Concern   Not on file  Social History Narrative   Lives alone   Widow   2 sons , one son decease, one son disable    Works at [employer] in [employer]   Enjoys going places with friends   Never smoked   Alcohol none   Exercise -walk, works in yard    Left handed    Social Determinants of Health   Financial Resource Strain: Low Risk  (05/16/2017)   Overall Financial Resource Strain (CARDIA)    Difficulty of Paying Living Expenses: Not hard at all  Food Insecurity: No Food Insecurity (06/20/2022)   Hunger Vital Sign    Worried About Running Out of Food in the Last Year: Never true    Ran Out of Food in the Last Year: Never true  Transportation Needs: No  Transportation Needs (06/20/2022)   PRAPARE - Administrator, Civil Service (Medical): No    Lack of Transportation (Non-Medical): No  Physical Activity: Inactive (05/16/2017)   Exercise Vital Sign    Days of Exercise per Week: 0 days    Minutes of Exercise per Session: 0 min  Stress: No Stress Concern Present (05/16/2017)   [employer] of Occupational Health - Occupational Stress Questionnaire    Feeling of Stress : Not at all  Social Connections: Moderately Integrated (05/16/2017)   Social Connection and Isolation Panel [NHANES]    Frequency of Communication with Friends and Family: More than three times a week    Frequency of Social Gatherings with Friends and Family: Three times a week  Attends Religious Services: 1 to 4 times per year    Active Member of Clubs or Organizations: Yes    Attends Banker Meetings: Never    Marital Status: Widowed     Family History: The patient's ***family history includes Alzheimer's disease in her brother; Bladder Cancer in her brother; COPD in her brother; Cancer in her father; Diabetes in her brother; Heart attack in her brother; Heart disease in her sister; Hypertension in her sister; Lung cancer in her sister; Mental illness in her son and son; Other in her brother, brother, and brother; Ovarian cancer in her sister; Skin cancer in her brother.  ROS:   Please see the history of present illness.    *** All other systems reviewed and are negative.  EKGs/Labs/Other Studies Reviewed:    The following studies were reviewed today: ***      Recent Labs: 10/26/2022: ALT 10; Hemoglobin 13.9; Platelets 137; TSH 0.66 03/20/2023: BUN 17; Creatinine, Ser 1.27; Potassium 4.0; Sodium 143  Recent Lipid Panel    Component Value Date/Time   CHOL 142 10/26/2022 1102   CHOL 155 11/22/2015 0858   TRIG 148 10/26/2022 1102   HDL 47 (L) 10/26/2022 1102   HDL 46 11/22/2015 0858   CHOLHDL 3.0 10/26/2022 1102   VLDL 17  07/08/2015 0535   LDLCALC 72 10/26/2022 1102     Risk Assessment/Calculations:   {Does this patient have ATRIAL FIBRILLATION?:[phone]}  No BP recorded.  {Refresh Note OR Click here to enter BP  :1}***         Physical Exam:    VS:  There were no vitals taken for this visit.    Wt Readings from Last 3 Encounters:  03/29/23 133 lb (60.3 kg)  03/20/23 133 lb (60.3 kg)  12/18/22 135 lb 12.8 oz (61.6 kg)     GEN: *** Well nourished, well developed in no acute distress HEENT: Normal NECK: No JVD; No carotid bruits LYMPHATICS: No lymphadenopathy CARDIAC: ***RRR, no murmurs, rubs, gallops RESPIRATORY:  Clear to auscultation without rales, wheezing or rhonchi  ABDOMEN: Soft, non-tender, non-distended MUSCULOSKELETAL:  No edema; No deformity  SKIN: Warm and dry NEUROLOGIC:  Alert and oriented x 3 PSYCHIATRIC:  Normal affect   ASSESSMENT:    No diagnosis found. PLAN:    In order of problems listed above:  ***      {Are you ordering a CV Procedure (e.g. stress test, cath, DCCV, TEE, etc)?   Press F2        :[id number]}    Medication Adjustments/Labs and Tests Ordered: Current medicines are reviewed at length with the patient today.  Concerns regarding medicines are outlined above.  No orders of the defined types were placed in this encounter.  No orders of the defined types were placed in this encounter.   There are no Patient Instructions on file for this visit.   Signed, Marcelino Duster, [place]  04/18/2023 3:18 PM    [facility]

## 2023-04-20 ENCOUNTER — Ambulatory Visit: Payer: Medicare PPO | Admitting: Hematology and Oncology

## 2023-04-23 ENCOUNTER — Telehealth: Payer: Self-pay

## 2023-04-23 NOTE — Telephone Encounter (Signed)
-----   Message from Jonita Albee sent at 04/23/2023  7:23 AM EST ----- Please tell patient that her echocardiogram showed EF (pumping function of the heart) was normal at 55-60%. There were no regional wall motion abnormalities. The RV is functioning normally. There is mild leakiness of the mitral valve. This was previously mild-moderate in 2021. There was also moderate leakiness of the tricuspid valve, was previously mild-moderate in 2021. This is something that we will continue to monitor over time, but does not need to be intervened on at this time.   Overall, echo is not significantly changed when compared to echo from 2021. No changes to treatment plan   Thanks KJ

## 2023-04-23 NOTE — Telephone Encounter (Signed)
Called patient advised of below they verbalized understanding.

## 2023-04-25 ENCOUNTER — Inpatient Hospital Stay: Payer: Medicare PPO | Attending: Hematology and Oncology | Admitting: Hematology and Oncology

## 2023-04-25 VITALS — BP 119/72 | HR 60 | Temp 97.6°F | Resp 17 | Wt 132.2 lb

## 2023-04-25 DIAGNOSIS — Z808 Family history of malignant neoplasm of other organs or systems: Secondary | ICD-10-CM | POA: Diagnosis not present

## 2023-04-25 DIAGNOSIS — Z79810 Long term (current) use of selective estrogen receptor modulators (SERMs): Secondary | ICD-10-CM | POA: Diagnosis not present

## 2023-04-25 DIAGNOSIS — Z9011 Acquired absence of right breast and nipple: Secondary | ICD-10-CM | POA: Insufficient documentation

## 2023-04-25 DIAGNOSIS — I4891 Unspecified atrial fibrillation: Secondary | ICD-10-CM | POA: Diagnosis not present

## 2023-04-25 DIAGNOSIS — Z801 Family history of malignant neoplasm of trachea, bronchus and lung: Secondary | ICD-10-CM | POA: Insufficient documentation

## 2023-04-25 DIAGNOSIS — K219 Gastro-esophageal reflux disease without esophagitis: Secondary | ICD-10-CM | POA: Insufficient documentation

## 2023-04-25 DIAGNOSIS — Z8041 Family history of malignant neoplasm of ovary: Secondary | ICD-10-CM | POA: Diagnosis not present

## 2023-04-25 DIAGNOSIS — C50411 Malignant neoplasm of upper-outer quadrant of right female breast: Secondary | ICD-10-CM | POA: Diagnosis not present

## 2023-04-25 DIAGNOSIS — Z8052 Family history of malignant neoplasm of bladder: Secondary | ICD-10-CM | POA: Insufficient documentation

## 2023-04-25 DIAGNOSIS — Z7901 Long term (current) use of anticoagulants: Secondary | ICD-10-CM | POA: Diagnosis not present

## 2023-04-25 DIAGNOSIS — Z170 Estrogen receptor positive status [ER+]: Secondary | ICD-10-CM | POA: Diagnosis not present

## 2023-04-25 NOTE — Progress Notes (Signed)
BRIEF ONCOLOGIC HISTORY:  Oncology History  Malignant neoplasm of upper-outer quadrant of right breast in female, estrogen receptor positive (HCC)  05/18/2022 Pathology Results   Invasive poorly differentiated ductal adenocarcinoma, grade 3. ER PR 100% strongly positive, Her 2 negative.   06/08/2022 Initial Diagnosis   Malignant neoplasm of upper-outer quadrant of right breast in female, estrogen receptor positive (HCC)   06/08/2022 Cancer Staging   Staging form: Breast, AJCC 8th Edition - Clinical stage from 06/08/2022: Stage IIA (cT2, cN0, cM0, G3, ER+, PR+, HER2-) - Signed by Erven Colla, PA-C on 06/08/2022 Stage prefix: Initial diagnosis Histologic grading system: 3 grade system   06/22/2022 Surgery   Right mastectomy: IDC 3.2 cm, grade 3, margins negative, no LVI, 0 SLN biopsied    07/06/2022 -  Anti-estrogen oral therapy   Tamoxifen   10/20/2022 Cancer Staging   Staging form: Breast, AJCC 8th Edition - Pathologic: Stage IB (pT2, pN0, cM0, G3, ER+, PR+, HER2-) - Signed by Loa Socks, NP on 10/20/2022 Histologic grading system: 3 grade system     INTERVAL HISTORY:   Discussed the use of AI scribe software for clinical note transcription with the patient, who gave verbal consent to proceed.  History of Present Illness         The patient, with a history of breast cancer and heart disease, reports being generally well since her last visit. She is currently on tamoxifen for breast cancer treatment and reports no side effects from the medication. She has had a bilateral mastectomy. She also mentions being "kind of wobbly" and uses a cane for support. She is due to see her cardiologist soon but had an echocardiogram recently which was reported as normal. She also takes Eliquis for atrial fibrillation.  REVIEW OF SYSTEMS:  Review of Systems  Constitutional:  Negative for appetite change, chills, fatigue, fever and unexpected weight change.  HENT:   Negative for  hearing loss, lump/mass and trouble swallowing.   Eyes:  Negative for eye problems and icterus.  Respiratory:  Negative for chest tightness, cough and shortness of breath.   Cardiovascular:  Negative for chest pain, leg swelling and palpitations.  Gastrointestinal:  Negative for abdominal distention, abdominal pain, constipation, diarrhea, nausea and vomiting.  Endocrine: Negative for hot flashes.  Genitourinary:  Negative for difficulty urinating.   Musculoskeletal:  Negative for arthralgias.  Skin:  Negative for itching and rash.  Neurological:  Negative for dizziness, extremity weakness, headaches and numbness.  Hematological:  Negative for adenopathy. Does not bruise/bleed easily.  Psychiatric/Behavioral:  Negative for depression. The patient is not nervous/anxious.   Breast: Denies any new nodularity, masses, tenderness, nipple changes, or nipple discharge.     PAST MEDICAL/SURGICAL HISTORY:  Past Medical History:  Diagnosis Date   Anxiety    Atrial fibrillation with rapid ventricular response (HCC) 07/07/2015   Bilateral breast cancer (HCC)    Daily headache    Depression    GERD (gastroesophageal reflux disease)    Hyperlipidemia    Hypertension    Hypothyroidism    Iron deficiency anemia 2000s   Lumbago    Nausea alone    PONV (postoperative nausea and vomiting)    Senile osteoporosis    Squamous cell carcinoma of forehead    "I think they burned it off"   Past Surgical History:  Procedure Laterality Date   ABDOMINAL HYSTERECTOMY  1970s   BREAST BIOPSY Bilateral    BREAST BIOPSY Right 05/18/2022   [place] RT BREAST BX W LOC  DEV 1ST LESION IMG BX SPEC US GUIDE 05/18/2022 [facility]MO[facility]   BREAST LUMPECTOMY Right 2002   "w/lymph nodes removed"   CARDIOVERSION N/A 08/19/2015   Procedure: CARDIOVERSION;  Surgeon: Thurmon Fair, MD;  Location: [facility];  Service: Cardiovascular;  Laterality: N/A;   CATARACT EXTRACTION W/ INTRAOCULAR LENS  IMPLANT, BILATERAL  Bilateral 2000s   DILATION AND CURETTAGE OF UTERUS  ~ 1968   ELBOW FRACTURE SURGERY Left 2011   "has 2 screws in it"   EXCISIONAL HEMORRHOIDECTOMY  ~ 1970   FRACTURE SURGERY  02/12/2016   Right Wrist   HEMIARTHROPLASTY HIP Left 2011   "had ball replaced"   INGUINAL HERNIA REPAIR Right 1980s?   MASTECTOMY Left 2005   MASTECTOMY Right 06/2022   ORIF WRIST FRACTURE Left 02/12/2016   Procedure: OPEN REDUCTION INTERNAL FIXATION LEFT DISTAL RADIUS;  Surgeon: Knute Neu, MD;  Location: [facility];  Service: Plastics;  Laterality: Left;   SIMPLE MASTECTOMY WITH AXILLARY SENTINEL NODE BIOPSY Right 06/22/2022   Procedure: RIGHT SIMPLE MASTECTOMY;  Surgeon: Almond Lint, MD;  Location: [facility];  Service: General;  Laterality: Right;   TOTAL THYROIDECTOMY  1972   WRIST FRACTURE SURGERY Right    "has a plate and 7 screws in there"     ALLERGIES:  No Known Allergies   CURRENT MEDICATIONS:  Outpatient Encounter Medications as of 04/25/2023  Medication Sig   acetaminophen (TYLENOL) 325 MG tablet Take 2 tablets (650 mg total) by mouth every 4 (four) hours as needed for headache or mild pain.   albuterol (VENTOLIN HFA) 108 (90 Base) MCG/ACT inhaler Inhale 1 puff into the lungs every 6 (six) hours as needed for shortness of breath.   ALPRAZolam (XANAX) 0.5 MG tablet TAKE 1 TABLET BY MOUTH DAILY FOR ANXIETY (Patient taking differently: Take 0.5 mg by mouth daily as needed for anxiety.)   calcium citrate (CALCITRATE - DOSED IN MG ELEMENTAL CALCIUM) 950 (200 Ca) MG tablet Take 200 mg of elemental calcium by mouth daily.   Cholecalciferol (VITAMIN D3) 2000 UNITS TABS Take 2,000 Units by mouth daily.   docusate sodium (COLACE) 100 MG capsule Take 1 capsule (100 mg total) by mouth daily.   ELIQUIS 5 MG TABS tablet TAKE 1 TABLET BY MOUTH TWICE DAILY   furosemide (LASIX) 40 MG tablet TAKE 1 TABLET BY MOUTH DAILY   gabapentin (NEURONTIN) 100 MG capsule Take 1 capsule (100 mg total) by mouth 2 (two) times daily.    gabapentin (NEURONTIN) 100 MG capsule TAKE 1 CAPSULE BY MOUTH DAILY AT BEDTIME   ibuprofen (ADVIL) 200 MG tablet Take 200 mg by mouth daily as needed for headache or mild pain.   Iron, Ferrous Sulfate, 325 (65 Fe) MG TABS Take 325 mg by mouth daily.   levothyroxine (SYNTHROID) 137 MCG tablet TAKE 1 TABLET BY MOUTH DAILY   meclizine (ANTIVERT) 12.5 MG tablet Take 12.5 mg by mouth as needed for dizziness.   metoprolol tartrate (LOPRESSOR) 50 MG tablet TAKE 1 TABLET BY MOUTH TWICE DAILY   potassium chloride SA (KLOR-CON M) 20 MEQ tablet Take 2 tablets (40 mEq total) by mouth daily.   rosuvastatin (CRESTOR) 10 MG tablet TAKE 1 TABLET BY MOUTH DAILY   sertraline (ZOLOFT) 25 MG tablet Take 2 tablets (50 mg total) by mouth daily.   tamoxifen (NOLVADEX) 20 MG tablet Take 1 tablet (20 mg total) by mouth daily.   No facility-administered encounter medications on file as of 04/25/2023.     ONCOLOGIC FAMILY HISTORY:  Family  History  Problem Relation Age of Onset   Cancer Father    Lung cancer Sister    Heart disease Sister    Hypertension Sister    Ovarian cancer Sister    Heart attack Brother    Other Brother    Skin cancer Brother    Other Brother    Other Brother    Alzheimer's disease Brother    Bladder Cancer Brother    Diabetes Brother    COPD Brother    Mental illness Son    Mental illness Son      SOCIAL HISTORY:  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   Tobacco Use   Smoking status: Never   Smokeless tobacco: Never  Vaping Use   Vaping status: Never Used  Substance and Sexual Activity   Alcohol use: No   Drug use: No   Sexual activity: Never  Other Topics Concern   Not on file  Social History Narrative   Lives alone   Widow   2  sons , one son decease, one son disable    Works at [employer] in [employer]   Enjoys going places with friends   Never smoked   Alcohol none   Exercise -walk, works in yard    Left handed    Social Determinants of Health   Financial Resource Strain: Low Risk  (05/16/2017)   Overall Financial Resource Strain (CARDIA)    Difficulty of Paying Living Expenses: Not hard at all  Food Insecurity: No Food Insecurity (06/20/2022)   Hunger Vital Sign    Worried About Running Out of Food in the Last Year: Never true    Ran Out of Food in the Last Year: Never true  Transportation Needs: No Transportation Needs (06/20/2022)   PRAPARE - Administrator, Civil Service (Medical): No    Lack of Transportation (Non-Medical): No  Physical Activity: Inactive (05/16/2017)   Exercise Vital Sign    Days of Exercise per Week: 0 days    Minutes of Exercise per Session: 0 min  Stress: No Stress Concern Present (05/16/2017)   [employer] of Occupational Health - Occupational Stress Questionnaire    Feeling of Stress : Not at all  Social Connections: Moderately Integrated (05/16/2017)   Social Connection and Isolation Panel [NHANES]    Frequency of Communication with Friends and Family: More than three times a week    Frequency of Social Gatherings with Friends and Family: Three times a week    Attends Religious Services: 1 to 4 times per year    Active Member of Clubs or Organizations: Yes    Attends Banker Meetings: Never    Marital Status: Widowed  Intimate Partner Violence: Not At Risk (05/16/2017)   Humiliation, Afraid, Rape, and Kick questionnaire    Fear of Current or Ex-Partner: No    Emotionally Abused: No    Physically Abused: No    Sexually Abused: No     OBSERVATIONS/OBJECTIVE:  BP 119/72 (BP Location: Left Arm, Patient Position: Sitting)   Pulse 60   Temp 97.6 F (36.4 C) (Temporal)   Resp 17   Wt 132 lb 3.2 oz (60 kg)   SpO2 (!) 83% Comment:  patient is aware of this.[name]  BMI 22.69 kg/m  GENERAL: Patient is a well appearing female in no acute distress S/p bilateral mastectomy, no concern for recurrence. NO LE swelling   LABORATORY DATA:  None for this visit.  DIAGNOSTIC IMAGING:  None for this visit.    ASSESSMENT AND PLAN:  Ms.. Vacanti is a pleasant 82 y.o. female with recurrent stage IB right breast invasive ductal carcinoma, ER+/PR+/HER2-, diagnosed in 05/2022, treated with mastectomy and anti-estrogen therapy with tamoxifen beginning in 06/2022.    Bilateral Mastectomy No evidence of recurrence on physical examination. No mammograms needed due to bilateral mastectomy. -Continue current follow-up plan.  Tamoxifen Therapy Tolerating well with no reported side effects. Discussed potential risks including blood clots and uterine problems, but patient has had a hysterectomy. -Continue Tamoxifen as prescribed.  Atrial Fibrillation Patient on Eliquis for anticoagulation. -Continue Eliquis as prescribed.  General Health Maintenance Upcoming cardiology appointment. Recent echocardiogram was normal. Patient reports being somewhat active at home but experiences some instability. -Encouraged to continue using cane for stability. -Continue with cardiology follow-up as planned.  Follow-up in one year unless issues arise.  *Total Encounter Time as defined by the Centers for Medicare and Medicaid Services includes, in addition to the face-to-face time of a patient visit (documented in the note above) non-face-to-face time: obtaining and reviewing outside history, ordering and reviewing medications, tests or procedures, care coordination (communications with other health care professionals or caregivers) and documentation in the medical record.

## 2023-04-27 ENCOUNTER — Ambulatory Visit (INDEPENDENT_AMBULATORY_CARE_PROVIDER_SITE_OTHER): Payer: Medicare PPO | Admitting: Family

## 2023-04-27 ENCOUNTER — Encounter: Payer: Self-pay | Admitting: Family

## 2023-04-27 VITALS — BP 120/70 | HR 77 | Temp 97.5°F | Resp 20 | Ht 64.0 in | Wt 138.2 lb

## 2023-04-27 DIAGNOSIS — I10 Essential (primary) hypertension: Secondary | ICD-10-CM

## 2023-04-27 DIAGNOSIS — I4819 Other persistent atrial fibrillation: Secondary | ICD-10-CM

## 2023-04-27 DIAGNOSIS — E7800 Pure hypercholesterolemia, unspecified: Secondary | ICD-10-CM | POA: Diagnosis not present

## 2023-04-27 DIAGNOSIS — Z23 Encounter for immunization: Secondary | ICD-10-CM | POA: Diagnosis not present

## 2023-04-27 DIAGNOSIS — E039 Hypothyroidism, unspecified: Secondary | ICD-10-CM

## 2023-04-27 DIAGNOSIS — R2681 Unsteadiness on feet: Secondary | ICD-10-CM

## 2023-04-27 NOTE — Progress Notes (Unsigned)
Provider: Richarda Blade FNP-C   Deborah Randall, Deborah Citrin, NP  Patient Care Team: [name], Deborah Citrin, NP as PCP - General (Family Medicine) Croitoru, Rachelle Hora, MD as PCP - Cardiology (Cardiology) [place], Amy, MD as Consulting Physician (Dermatology) Duke, Roe Rutherford, [place] as Physician Assistant (Cardiology) Rachel Moulds, MD as Consulting Physician (Hematology and Oncology) Almond Lint, MD as Consulting Physician (General Surgery)  Extended Emergency Contact Information Primary Emergency Contact: [name],[name] Address: [address]          [place] [address] [place] Home Phone: [phone] Mobile Phone: [phone] Relation: Son Secondary Emergency Contact: [name] [place] of [place] Mobile Phone: [phone] Relation: Granddaughter  Code Status:  Full Code  Goals of care: Advanced Directive information    12/18/2022    9:05 AM  Advanced Directives  Does Patient Have a Medical Advance Directive? Yes  Type of Advance Directive Living will     Chief Complaint  Patient presents with  . Medical Management of Chronic Issues    Patient presents today for a 6 month follow-up  . Quality Metric Gaps    COVID#4, flu    HPI:  Pt is a 82 y.o. female seen today for 6 months follow up for medical management of chronic diseases.   Follows up   Past Medical History:  Diagnosis Date  . Anxiety   . Atrial fibrillation with rapid ventricular response (HCC) 07/07/2015  . Bilateral breast cancer (HCC)   . Daily headache   . Depression   . GERD (gastroesophageal reflux disease)   . Hyperlipidemia   . Hypertension   . Hypothyroidism   . Iron deficiency anemia 2000s  . Lumbago   . Nausea alone   . PONV (postoperative nausea and vomiting)   . Senile osteoporosis   . Squamous cell carcinoma of forehead    "I think they burned it off"   Past Surgical History:  Procedure Laterality Date  . ABDOMINAL HYSTERECTOMY  1970s  . BREAST BIOPSY Bilateral    . BREAST BIOPSY Right 05/18/2022   [place] RT BREAST BX W LOC DEV 1ST LESION IMG BX SPEC US GUIDE 05/18/2022 [facility]  . BREAST LUMPECTOMY Right 2002   "w/lymph nodes removed"  . CARDIOVERSION N/A 08/19/2015   Procedure: CARDIOVERSION;  Surgeon: Thurmon Fair, MD;  Location: [facility];  Service: Cardiovascular;  Laterality: N/A;  . CATARACT EXTRACTION W/ INTRAOCULAR LENS  IMPLANT, BILATERAL Bilateral 2000s  . DILATION AND CURETTAGE OF UTERUS  ~ 1968  . ELBOW FRACTURE SURGERY Left 2011   "has 2 screws in it"  . EXCISIONAL HEMORRHOIDECTOMY  ~ 1970  . FRACTURE SURGERY  02/12/2016   Right Wrist  . HEMIARTHROPLASTY HIP Left 2011   "had ball replaced"  . INGUINAL HERNIA REPAIR Right 1980s?  [name] MASTECTOMY Left 2005  . MASTECTOMY Right 06/2022  . ORIF WRIST FRACTURE Left 02/12/2016   Procedure: OPEN REDUCTION INTERNAL FIXATION LEFT DISTAL RADIUS;  Surgeon: Knute Neu, MD;  Location: [facility] OR;  Service: Plastics;  Laterality: Left;  . SIMPLE MASTECTOMY WITH AXILLARY SENTINEL NODE BIOPSY Right 06/22/2022   Procedure: RIGHT SIMPLE MASTECTOMY;  Surgeon: Almond Lint, MD;  Location: [facility] OR;  Service: General;  Laterality: Right;  . TOTAL THYROIDECTOMY  1972  . WRIST FRACTURE SURGERY Right    "has a plate and 7 screws in there"    No Known Allergies  Allergies as of 04/27/2023   No Known Allergies      Medication List  Accurate as of April 27, 2023 10:58 AM. If you have any questions, ask your nurse or doctor.          acetaminophen 325 MG tablet Commonly known as: TYLENOL Take 2 tablets (650 mg total) by mouth every 4 (four) hours as needed for headache or mild pain.   albuterol 108 (90 Base) MCG/ACT inhaler Commonly known as: VENTOLIN HFA Inhale 1 puff into the lungs every 6 (six) hours as needed for shortness of breath.   ALPRAZolam 0.5 MG tablet Commonly known as: XANAX TAKE 1 TABLET BY MOUTH DAILY FOR ANXIETY What changed:  how much to take how to  take this when to take this reasons to take this additional instructions   calcium citrate 950 (200 Ca) MG tablet Commonly known as: CALCITRATE - dosed in mg elemental calcium Take 200 mg of elemental calcium by mouth daily.   docusate sodium 100 MG capsule Commonly known as: Colace Take 1 capsule (100 mg total) by mouth daily.   Eliquis 5 MG Tabs tablet Generic drug: apixaban TAKE 1 TABLET BY MOUTH TWICE DAILY   furosemide 40 MG tablet Commonly known as: LASIX TAKE 1 TABLET BY MOUTH DAILY   gabapentin 100 MG capsule Commonly known as: NEURONTIN Take 1 capsule (100 mg total) by mouth 2 (two) times daily.   gabapentin 100 MG capsule Commonly known as: NEURONTIN TAKE 1 CAPSULE BY MOUTH DAILY AT BEDTIME   ibuprofen 200 MG tablet Commonly known as: ADVIL Take 200 mg by mouth daily as needed for headache or mild pain.   Iron (Ferrous Sulfate) 325 (65 Fe) MG Tabs Take 325 mg by mouth daily.   levothyroxine 137 MCG tablet Commonly known as: SYNTHROID TAKE 1 TABLET BY MOUTH DAILY   meclizine 12.5 MG tablet Commonly known as: ANTIVERT Take 12.5 mg by mouth as needed for dizziness.   metoprolol tartrate 50 MG tablet Commonly known as: LOPRESSOR TAKE 1 TABLET BY MOUTH TWICE DAILY   potassium chloride SA 20 MEQ tablet Commonly known as: KLOR-CON M Take 2 tablets (40 mEq total) by mouth daily.   rosuvastatin 10 MG tablet Commonly known as: CRESTOR TAKE 1 TABLET BY MOUTH DAILY   sertraline 25 MG tablet Commonly known as: ZOLOFT Take 2 tablets (50 mg total) by mouth daily.   tamoxifen 20 MG tablet Commonly known as: NOLVADEX Take 1 tablet (20 mg total) by mouth daily.   Vitamin D3 50 MCG (2000 UT) Tabs Take 2,000 Units by mouth daily.        Review of Systems  Constitutional:  Negative for appetite change, chills, fatigue, fever and unexpected weight change.  HENT:  Negative for congestion, dental problem, ear discharge, ear pain, facial swelling, hearing  loss, nosebleeds, postnasal drip, rhinorrhea, sinus pressure, sinus pain, sneezing, sore throat, tinnitus and trouble swallowing.   Eyes:  Negative for pain, discharge, redness, itching and visual disturbance.  Respiratory:  Negative for cough, chest tightness, shortness of breath and wheezing.   Cardiovascular:  Negative for chest pain, palpitations and leg swelling.  Gastrointestinal:  Negative for abdominal distention, abdominal pain, blood in stool, constipation, diarrhea, nausea and vomiting.  Endocrine: Negative for cold intolerance, heat intolerance, polydipsia, polyphagia and polyuria.  Genitourinary:  Negative for difficulty urinating, dysuria, flank pain, frequency and urgency.  Musculoskeletal:  Positive for gait problem. Negative for arthralgias, back pain, joint swelling, myalgias, neck pain and neck stiffness.  Skin:  Negative for color change, pallor, rash and wound.  Neurological:  Positive for tremors.  Negative for dizziness, syncope, speech difficulty, weakness, light-headedness, numbness and headaches.  Hematological:  Does not bruise/bleed easily.  Psychiatric/Behavioral:  Negative for agitation, behavioral problems, confusion, hallucinations, self-injury, sleep disturbance and suicidal ideas. The patient is not nervous/anxious.     Immunization History  Administered Date(s) Administered  . Fluad Quad(high Dose 65+) 03/20/2019, 06/03/2020, 03/31/2021, 04/25/2022  . Fluad Trivalent(High Dose 65+) 04/27/2023  . Influenza, High Dose Seasonal PF 02/08/2017, 04/04/2018  . Influenza,inj,Quad PF,6+ Mos 03/14/2013, 03/26/2014, 02/19/2015, 03/02/2016  . PFIZER(Purple Top)SARS-COV-2 Vaccination 07/21/2019, 08/13/2019, 07/01/2020  . Pneumococcal Conjugate-13 08/02/2013, 08/03/2014  . Pneumococcal Polysaccharide-23 05/22/2009  . Td 10/22/2017  . Zoster Recombinant(Shingrix) 05/20/2018, 11/06/2018  . Zoster, Live 09/25/2013   Pertinent  Health Maintenance Due  Topic Date Due  .  INFLUENZA VACCINE  Completed  . DEXA SCAN  Completed      05/01/2022    9:31 AM 09/05/2022   10:59 AM 10/26/2022    9:59 AM 12/18/2022    9:04 AM 04/27/2023   10:11 AM  Fall Risk  Falls in the past year? 0 1 0 0 0  Was there an injury with Fall? 0 1 0 0 0  Fall Risk Category Calculator 0 2 0 0 0  Fall Risk Category (Retired) Low      (RETIRED) Patient Fall Risk Level Low fall risk      Patient at Risk for Falls Due to No Fall Risks History of fall(s);Impaired mobility;Impaired balance/gait No Fall Risks  No Fall Risks  Fall risk Follow up Falls evaluation completed Falls evaluation completed;Education provided;Falls prevention discussed Falls evaluation completed Falls evaluation completed Falls evaluation completed   Functional Status Survey:    Vitals:   04/27/23 1007  BP: 120/70  Pulse: 77  Resp: 20  Temp: (!) 97.5 F (36.4 C)  SpO2: 96%  Weight: 138 lb 3.2 oz (62.7 kg)  Height: 5\' 4"  (1.626 m)   Body mass index is 23.72 kg/m. Physical Exam Vitals reviewed.  Constitutional:      General: She is not in acute distress.    Appearance: Normal appearance. She is normal weight. She is not ill-appearing or diaphoretic.  HENT:     Head: Normocephalic.     Right Ear: Tympanic membrane, ear canal and external ear normal. There is no impacted cerumen.     Left Ear: Tympanic membrane, ear canal and external ear normal. There is no impacted cerumen.     Nose: Nose normal. No congestion or rhinorrhea.     Mouth/Throat:     Mouth: Mucous membranes are moist.     Pharynx: Oropharynx is clear. No oropharyngeal exudate or posterior oropharyngeal erythema.  Eyes:     General: No scleral icterus.       Right eye: No discharge.        Left eye: No discharge.     Extraocular Movements: Extraocular movements intact.     Conjunctiva/sclera: Conjunctivae normal.     Pupils: Pupils are equal, round, and reactive to light.  Neck:     Vascular: No carotid bruit.  Cardiovascular:     Rate  and Rhythm: Normal rate and regular rhythm.     Pulses: Normal pulses.     Heart sounds: Normal heart sounds. No murmur heard.    No friction rub. No gallop.  Pulmonary:     Effort: Pulmonary effort is normal. No respiratory distress.     Breath sounds: Normal breath sounds. No wheezing, rhonchi or rales.  Chest:  Chest wall: No tenderness.  Abdominal:     General: Bowel sounds are normal. There is no distension.     Palpations: Abdomen is soft. There is no mass.     Tenderness: There is no abdominal tenderness. There is no right CVA tenderness, left CVA tenderness, guarding or rebound.  Musculoskeletal:        General: No swelling or tenderness. Normal range of motion.     Cervical back: Normal range of motion. No rigidity or tenderness.     Right lower leg: No edema.     Left lower leg: No edema.  Lymphadenopathy:     Cervical: No cervical adenopathy.  Skin:    General: Skin is warm and dry.     Coloration: Skin is not pale.     Findings: No bruising, erythema, lesion or rash.  Neurological:     Mental Status: She is alert and oriented to person, place, and time.     Cranial Nerves: No cranial nerve deficit.     Sensory: No sensory deficit.     Motor: No weakness.     Coordination: Coordination normal.     Gait: Gait normal.     Comments: Head tremors   Psychiatric:        Mood and Affect: Mood normal.        Speech: Speech normal.        Behavior: Behavior normal.        Thought Content: Thought content normal.        Judgment: Judgment normal.    Labs reviewed: Recent Labs    06/21/22 1058 06/22/22 0943 10/26/22 1102 03/20/23 1621  NA 141 142 144 143  K 2.9* 2.9* 3.7 4.0  CL 101 98 103 100  CO2 28  --  31 29  GLUCOSE 88 93 86 92  BUN 12 16 14 17   CREATININE 1.28* 1.40* 1.47* 1.27*  CALCIUM 8.9  --  9.0 9.0   Recent Labs    10/26/22 1102  AST 18  ALT 10  BILITOT 0.6  PROT 6.8   Recent Labs    06/21/22 1058 06/22/22 0943 10/26/22 1102  WBC 7.4   --  7.8  NEUTROABS  --   --  4,064  HGB 13.7 14.3 13.9  HCT 42.6 42.0 43.1  MCV 94.0  --  91.7  PLT 122*  --  137*   Lab Results  Component Value Date   TSH 0.66 10/26/2022   Lab Results  Component Value Date   HGBA1C 6.0 (H) 09/15/2019   Lab Results  Component Value Date   CHOL 142 10/26/2022   HDL 47 (L) 10/26/2022   LDLCALC 72 10/26/2022   TRIG 148 10/26/2022   CHOLHDL 3.0 10/26/2022    Significant Diagnostic Results in last 30 days:  ECHOCARDIOGRAM COMPLETE  Result Date: 04/18/2023    ECHOCARDIOGRAM REPORT   Patient Name:   KELDA KELLERMAN   Date of Exam: 04/18/2023 Medical Rec #:  [id number]     Height:       64.0 in Accession #:    [id number]    Weight:       133.0 lb Date of Birth:  05/12/1941     BSA:          1.645 m Patient Age:    82 years      BP:           122/78 mmHg Patient Gender: F  HR:           83 bpm. Exam Location:  [address] Procedure: 2D Echo, Cardiac Doppler and Color Doppler Indications:    R06.02 SOB; R07.89 Other chest pain  History:        Patient has prior history of Echocardiogram examinations, most                 recent 09/30/2019. CHF, Arrythmias:Atrial Fibrillation,                 Signs/Symptoms:Chest Pain; Risk Factors:Hypertension and                 Dyslipidemia. Atherosclerosis of aorta. Hypothyroidism.                 Bilateral breast cancer. Bilateral breast mastectomy.  Sonographer:    Cathie Beams RCS Referring Phys: [id number] Jonita Albee IMPRESSIONS  1. Left ventricular ejection fraction, by estimation, is 55 to 60%. The left ventricle has normal function. The left ventricle has no regional wall motion abnormalities. Left ventricular diastolic parameters are indeterminate.  2. Right ventricular systolic function is normal. The right ventricular size is normal.  3. Left atrial size was severely dilated.  4. Right atrial size was severely dilated.  5. The mitral valve is abnormal. Mild mitral valve regurgitation. No evidence of  mitral stenosis.  6. Tricuspid valve regurgitation is moderate.  7. The aortic valve is tricuspid. There is moderate calcification of the aortic valve. There is moderate thickening of the aortic valve. Aortic valve regurgitation is not visualized. Aortic valve sclerosis is present, with no evidence of aortic valve stenosis.  8. The inferior vena cava is normal in size with greater than 50% respiratory variability, suggesting right atrial pressure of 3 mmHg. FINDINGS  Left Ventricle: Left ventricular ejection fraction, by estimation, is 55 to 60%. The left ventricle has normal function. The left ventricle has no regional wall motion abnormalities. The left ventricular internal cavity size was normal in size. There is  no left ventricular hypertrophy. Left ventricular diastolic parameters are indeterminate. Right Ventricle: The right ventricular size is normal. No increase in right ventricular wall thickness. Right ventricular systolic function is normal. Left Atrium: Left atrial size was severely dilated. Right Atrium: Right atrial size was severely dilated. Pericardium: There is no evidence of pericardial effusion. Mitral Valve: The mitral valve is abnormal. There is mild thickening of the mitral valve leaflet(s). There is mild calcification of the mitral valve leaflet(s). Mild mitral valve regurgitation. No evidence of mitral valve stenosis. Tricuspid Valve: The tricuspid valve is normal in structure. Tricuspid valve regurgitation is moderate . No evidence of tricuspid stenosis. Aortic Valve: The aortic valve is tricuspid. There is moderate calcification of the aortic valve. There is moderate thickening of the aortic valve. Aortic valve regurgitation is not visualized. Aortic valve sclerosis is present, with no evidence of aortic valve stenosis. Pulmonic Valve: The pulmonic valve was normal in structure. Pulmonic valve regurgitation is trivial. No evidence of pulmonic stenosis. Aorta: The aortic root is normal in  size and structure. Venous: The inferior vena cava is normal in size with greater than 50% respiratory variability, suggesting right atrial pressure of 3 mmHg. IAS/Shunts: No atrial level shunt detected by color flow Doppler.  LEFT VENTRICLE PLAX 2D LVIDd:         3.20 cm LVIDs:         2.50 cm LV PW:         0.90 cm LV IVS:  0.80 cm LVOT diam:     2.00 cm LV SV:         47 LV SV Index:   28 LVOT Area:     3.14 cm  RIGHT VENTRICLE RV Basal diam:  3.30 cm RV S prime:     8.15 cm/s TAPSE (M-mode): 1.5 cm RVSP:           29.0 mmHg LEFT ATRIUM             Index        RIGHT ATRIUM           Index LA diam:        5.60 cm 3.40 cm/m   RA Pressure: 3.00 mmHg LA Vol (A2C):   44.8 ml 27.24 ml/m  RA Area:     18.40 cm LA Vol (A4C):   47.0 ml 28.57 ml/m  RA Volume:   45.50 ml  27.66 ml/m LA Biplane Vol: 45.9 ml 27.90 ml/m  AORTIC VALVE LVOT Vmax:   75.37 cm/s LVOT Vmean:  49.500 cm/s LVOT VTI:    0.148 m  AORTA Ao Root diam: 3.00 cm Ao Asc diam:  3.20 cm MR Peak grad: 78.3 mmHg   TRICUSPID VALVE MR Mean grad: 52.3 mmHg   TR Peak grad:   26.0 mmHg MR Vmax:      442.33 cm/s TR Vmax:        255.00 cm/s MR Vmean:     340.3 cm/s  Estimated RAP:  3.00 mmHg                           RVSP:           29.0 mmHg                            SHUNTS                           Systemic VTI:  0.15 m                           Systemic Diam: 2.00 cm Charlton Haws MD Electronically signed by Charlton Haws MD Signature Date/Time: 04/18/2023/2:37:56 PM    Final    MYOCARDIAL PERFUSION IMAGING  Result Date: 03/30/2023 .  The study is normal. The study is low risk. .  No ST deviation was noted. .  LV perfusion is normal. There is no evidence of ischemia. There is no evidence of infarction. .  Left ventricular function is normal. Nuclear stress EF: 70%. The left ventricular ejection fraction is hyperdynamic (>65%). End diastolic cavity size is normal. End systolic cavity size is normal.    Assessment/Plan 1. Need for influenza  vaccination *** - Flu Vaccine Trivalent High Dose (Fluad)  2. Acquired hypothyroidism ***  3. Essential hypertension, benign ***  4. Hypercholesterolemia ***    Family/ staff Communication: Reviewed plan of care with patient  Labs/tests ordered: None   Next Appointment :   Caesar Bookman, NP

## 2023-04-28 LAB — COMPLETE METABOLIC PANEL WITH GFR
AG Ratio: 1.4 (calc) (ref 1.0–2.5)
ALT: 7 U/L (ref 6–29)
AST: 18 U/L (ref 10–35)
Albumin: 4.1 g/dL (ref 3.6–5.1)
Alkaline phosphatase (APISO): 56 U/L (ref 37–153)
BUN/Creatinine Ratio: 12 (calc) (ref 6–22)
BUN: 16 mg/dL (ref 7–25)
CO2: 33 mmol/L — ABNORMAL HIGH (ref 20–32)
Calcium: 9.4 mg/dL (ref 8.6–10.4)
Chloride: 100 mmol/L (ref 98–110)
Creat: 1.36 mg/dL — ABNORMAL HIGH (ref 0.60–0.95)
Globulin: 3 g/dL (ref 1.9–3.7)
Glucose, Bld: 100 mg/dL — ABNORMAL HIGH (ref 65–99)
Potassium: 4.2 mmol/L (ref 3.5–5.3)
Sodium: 142 mmol/L (ref 135–146)
Total Bilirubin: 0.7 mg/dL (ref 0.2–1.2)
Total Protein: 7.1 g/dL (ref 6.1–8.1)
eGFR: 39 mL/min/{1.73_m2} — ABNORMAL LOW (ref >=60)

## 2023-04-28 LAB — TSH: TSH: 0.93 m[IU]/L (ref 0.40–4.50)

## 2023-04-28 LAB — CBC WITH DIFFERENTIAL/PLATELET
Absolute Lymphocytes: 1804 {cells}/uL (ref 850–3900)
Absolute Monocytes: 689 {cells}/uL (ref 200–950)
Basophils Absolute: 82 {cells}/uL (ref 0–200)
Basophils Relative: 1 %
Eosinophils Absolute: 189 {cells}/uL (ref 15–500)
Eosinophils Relative: 2.3 %
HCT: 46.3 % — ABNORMAL HIGH (ref 35.0–45.0)
Hemoglobin: 14.9 g/dL (ref 11.7–15.5)
MCH: 29.9 pg (ref 27.0–33.0)
MCHC: 32.2 g/dL (ref 32.0–36.0)
MCV: 93 fL (ref 80.0–100.0)
MPV: 10.3 fL (ref 7.5–12.5)
Monocytes Relative: 8.4 %
Neutro Abs: 5437 {cells}/uL (ref 1500–7800)
Neutrophils Relative %: 66.3 %
Platelets: 142 10*3/uL (ref 140–400)
RBC: 4.98 10*6/uL (ref 3.80–5.10)
RDW: 12.1 % (ref 11.0–15.0)
Total Lymphocyte: 22 %
WBC: 8.2 10*3/uL (ref 3.8–10.8)

## 2023-04-28 LAB — LIPID PANEL
Cholesterol: 138 mg/dL (ref <200)
HDL: 54 mg/dL (ref >=50)
LDL Cholesterol (Calc): 62 mg/dL
Non-HDL Cholesterol (Calc): 84 mg/dL (ref <130)
Total CHOL/HDL Ratio: 2.6 (calc) (ref <5.0)
Triglycerides: 134 mg/dL (ref <150)

## 2023-04-30 ENCOUNTER — Ambulatory Visit: Payer: Medicare PPO | Admitting: Physician Assistant

## 2023-05-07 ENCOUNTER — Other Ambulatory Visit: Payer: Self-pay | Admitting: Adult Health

## 2023-05-07 DIAGNOSIS — M545 Low back pain, unspecified: Secondary | ICD-10-CM

## 2023-05-21 NOTE — Progress Notes (Signed)
 Cardiology Office Note:    Date:  05/31/2023   ID:  NADEEN SHIPMAN, DOB Oct 14, 1940, MRN [id number]  PCP:  Leonarda Roxan BROCKS, NP  Cardiologist:  Jerel Balding, MD Cardiology APP:  Madie Jon Garre, PA     Referring MD: Leonarda Roxan BROCKS, NP   Chief Complaint: follow up of chest pain and dyspnea   History of Present Illness:    Deborah Randall is a 82 y.o. female with a history of persistent atrial fibrillation on Eliquis , hypertension, hyperlipidemia, and hypothyroidism, iron  deficiency anemia, GERD, headaches, and anxiety/ depression who is followed by Dr. Balding and presents today for follow up of chest pain and dyspnea on exertion.   Patient was initially diagnosed with atrial fibrillation back in 2011 and then underwent a DCCV in 2017. Myoview  in 06/2015 showed no evidence of ischemia or infarction. Echo in 09/2019 showed LVEF of 55-60% with normal wall motion, normal RV size and function, biatrial enlargement (right > left), and mild to moderate MR. 2 week Zio monitor in 10/2021 showed 100% atrial fibrillation burden and isolated PVCs.   She was last seen by Rollo Louder, PA-C, in 02/2023 at which time she reported chest discomfort and dyspnea with exertion that were relieved with rest. Lexiscan  Myoview  and Echo were ordered for further evaluation. Myoview  was low risk with no evidence of ischemia. Echo showed LVEF of 55-60% with normal wall motion, normal RV size and function, severe biatrial enlargement, mild MR, and moderate TR.   Patient presents today for follow-up. Here with her son. She is a difficult historian and has trouble elaborating on her symptoms. She denies any chest pain and states her breathing is usually okay. However, she states has good days and bad days.  She states on Christmas Day she felt really bad and had a fall after getting out of bed but she could not really tell me why she fell provide or what caused her to fall. She had another fall the following day and her  grandson had to pick her up. She was again unclear why she fell. Her son thinks she likely just loss her balance. He states she has a bad hip and it gets stiff in the cold. When I asked whether she was having any lightheadedness/dizziness, palpitations , or chest pain prior to falling, she states I do not know.  She does describe some possible lightheadedness/dizziness at other times.  She also reports she was at a store one day recently and felt like she was got going to pass out so she walked out to her car and got really short of breath and thought she was not going to make it. I again asked her whether she was having any lightheadedness/ dizziness or palpitations during this and and she was unsure.  She states she just got this feeling in her chest/ abdomen and felt like she was going to pass out. She denies any syncope though. No orthopnea or PND. She has chronic and stable lower extremity edema.   She thinks she has had 3 falls during the last 6 months. During one fall earlier this year, she fractured her elbow. It sounds like this was from a mechanical fall. She currently ambulates with a cane but they have ordered a walker for her and are waiting on this. She denies hitting her head during the falls.  EKGs/Labs/Other Studies Reviewed:    The following studies were reviewed:  2 Week Zio Monitor 10/2021: Patch Wear Time:  14  days and 0 hours (2023-06-06T12:31:41-0400 to 2023-06-20T12:31:45-0400)   Atrial Fibrillation occurred continuously (100% burden), ranging from 46-140 bpm (avg of 75 bpm). Isolated VEs were rare (<1.0%), VE Couplets were rare (<1.0%), and no VE Triplets were present.  The dominant rhythm is atrial fibrillation with mostly well controlled ventricular rates. Episodes of mild slow ventricular rates are seen at night and mild rapid ventricular response is seen during activity.   Persistent atrial fibrillation with well-controlled ventricular rates. _______________  Myoview   03/29/2023:   The study is normal. The study is low risk.   No ST deviation was noted.   LV perfusion is normal. There is no evidence of ischemia. There is no evidence of infarction.   Left ventricular function is normal. Nuclear stress EF: 70%. The left ventricular ejection fraction is hyperdynamic (>65%). End diastolic cavity size is normal. End systolic cavity size is normal. _______________  Echocardiogram 04/18/2023: Impressions: 1. Left ventricular ejection fraction, by estimation, is 55 to 60%. The  left ventricle has normal function. The left ventricle has no regional  wall motion abnormalities. Left ventricular diastolic parameters are  indeterminate.   2. Right ventricular systolic function is normal. The right ventricular  size is normal.   3. Left atrial size was severely dilated.   4. Right atrial size was severely dilated.   5. The mitral valve is abnormal. Mild mitral valve regurgitation. No  evidence of mitral stenosis.   6. Tricuspid valve regurgitation is moderate.   7. The aortic valve is tricuspid. There is moderate calcification of the  aortic valve. There is moderate thickening of the aortic valve. Aortic  valve regurgitation is not visualized. Aortic valve sclerosis is present,  with no evidence of aortic valve  stenosis.   8. The inferior vena cava is normal in size with greater than 50%  respiratory variability, suggesting right atrial pressure of 3 mmHg.   EKG:  EKG not ordered today.   Recent Labs: 04/27/2023: ALT 7; BUN 16; Creat 1.36; Hemoglobin 14.9; Platelets 142; Potassium 4.2; Sodium 142; TSH 0.93  Recent Lipid Panel    Component Value Date/Time   CHOL 138 04/27/2023 1121   CHOL 155 11/22/2015 0858   TRIG 134 04/27/2023 1121   HDL 54 04/27/2023 1121   HDL 46 11/22/2015 0858   CHOLHDL 2.6 04/27/2023 1121   VLDL 17 07/08/2015 0535   LDLCALC 62 04/27/2023 1121    Physical Exam:    Vital Signs: BP 110/64   Pulse 71   Ht 5' 4 (1.626 m)   Wt  136 lb 12.8 oz (62.1 kg)   SpO2 97%   BMI 23.48 kg/m     Orthostatic Vital Signs: - Supine:   BP 112/71, P 68 - Sitting:  BP 117/71, P 70 - Standing:  BP 98/ 66, P 74 - Standing (3 mins): BP 119/77, P 76   Wt Readings from Last 3 Encounters:  05/31/23 136 lb 12.8 oz (62.1 kg)  04/27/23 138 lb 3.2 oz (62.7 kg)  04/25/23 132 lb 3.2 oz (60 kg)     General: 82 y.o. frail Caucasian female in no acute distress. HEENT: Normocephalic and atraumatic. Sclera clear.  Neck: Supple. No carotid bruits. No JVD. Heart: Irregularly irregular rhythm with normal rate. No murmurs, gallops, or rubs.  Lungs: No increased work of breathing. Clear to ausculation bilaterally. No wheezes, rhonchi, or rales.  Extremities: Mild (mostly nonpitting) lower extremity edema bilaterally.  Skin: Warm and dry. Neuro: No focal deficits. Psych: Normal affect. Responds  appropriately.   Assessment:    1. History of chest pain   2. Dyspnea on exertion   3. Falls   4. Dizziness   5. Persistent atrial fibrillation (HCC)   6. Mitral valve insufficiency, unspecified etiology   7. Tricuspid valve insufficiency, unspecified etiology   8. Primary hypertension   9. Hyperlipidemia, unspecified hyperlipidemia type     Plan:    Chest Pain  Dyspnea on Exertion Patient reported exertional chest pain and dyspnea at last visit in 02/2023. Lexiscan  Myoview  was low risk and showed no evidence of ischemia. Echo showed normal LV function.  - Patient is a difficult historian and has difficulty explaining/ elaborating on symptoms but it does not sounds like she has had any further chest pain or any significant dyspnea.  - No additional work-up necessary at this time.    Falls Lightheadedness/ Dizziness  Patient has had 2 falls over the last months. She has a lot of difficulty explaining what caused the falls. Difficult to say whether she had prodromal symptoms prior to fall. She also describes one episode of near syncope. No  overt syncope. - Recent Myoview  and Echo were reassuring.  - Orthostatic vital signs negative. - Will get a 2 week monitor to make sure she is not having any significant brady or tachy arrhythmias to explain falls and vague symptoms given she is a difficult historian. If monitor is unremarkable, I don't think symptoms are cardiac in nature.  Persistent Atrial Fibrillation Initially diagnosed in 2011. Monitor in 2023 showed 100% atrial fibrillation burden with average heart rate of 75 bpm.  - Rate controlled.  - Continue Lopressor  50mg  twice daily.  - Continue chronic anticoagulation with Eliquis  5mg  twice daily.  - Advised patient to let us  know if she is having more frequent or serious falls. If this were to happen, may need to discuss stroke risk vs bleed risk on anticoagulation.   Mitral Regurgitation Tricuspid Regurgitation Recent Echo in 03/2023 showed mild MR and moderate TR.   Hypertension BP well controlled.  - Continue Lopressor  50mg  twice daily.  - Also on Lasix  40mg  daily for lower extremity edema. Continue.   Hyperlipidemia Lipid panel in 10/2022: Total Cholesterol 142, Triglycerides 148, HDL 47, LDL 72.  - Continue Crestor  10mg  daily.   Disposition: Follow up in 3-4 months.    Signed, Aline FORBES Door, PA-C  05/31/2023 12:31 PM    [facility]

## 2023-05-31 ENCOUNTER — Encounter: Payer: Self-pay | Admitting: Student

## 2023-05-31 ENCOUNTER — Ambulatory Visit: Payer: Medicare PPO

## 2023-05-31 ENCOUNTER — Ambulatory Visit: Payer: Medicare PPO | Attending: Student | Admitting: Student

## 2023-05-31 VITALS — BP 110/64 | HR 71 | Ht 64.0 in | Wt 136.8 lb

## 2023-05-31 DIAGNOSIS — Z87898 Personal history of other specified conditions: Secondary | ICD-10-CM

## 2023-05-31 DIAGNOSIS — R42 Dizziness and giddiness: Secondary | ICD-10-CM

## 2023-05-31 DIAGNOSIS — I071 Rheumatic tricuspid insufficiency: Secondary | ICD-10-CM

## 2023-05-31 DIAGNOSIS — I4819 Other persistent atrial fibrillation: Secondary | ICD-10-CM

## 2023-05-31 DIAGNOSIS — I340 Nonrheumatic mitral (valve) insufficiency: Secondary | ICD-10-CM | POA: Diagnosis not present

## 2023-05-31 DIAGNOSIS — I10 Essential (primary) hypertension: Secondary | ICD-10-CM | POA: Diagnosis not present

## 2023-05-31 DIAGNOSIS — E785 Hyperlipidemia, unspecified: Secondary | ICD-10-CM | POA: Diagnosis not present

## 2023-05-31 DIAGNOSIS — R296 Repeated falls: Secondary | ICD-10-CM

## 2023-05-31 DIAGNOSIS — R0609 Other forms of dyspnea: Secondary | ICD-10-CM

## 2023-05-31 NOTE — Patient Instructions (Signed)
 Medication Instructions:  No changes *If you need a refill on your cardiac medications before your next appointment, please call your pharmacy*  Testing/Procedures: Your physician has recommended that you wear a 14 DAY ZIO-PATCH monitor. The Zio patch cardiac monitor continuously records heart rhythm data for up to 14 days, this is for patients being evaluated for multiple types heart rhythms. For the first 24 hours post application, please avoid getting the Zio monitor wet in the shower or by excessive sweating during exercise. After that, feel free to carry on with regular activities. Keep soaps and lotions away from the ZIO XT Patch.  This will be mailed to you, please expect 7-10 days to receive.    Applying the monitor   Shave hair from upper left chest.   Hold abrader disc by orange tab.  Rub abrader in 40 strokes over left upper chest as indicated in your monitor instructions.   Clean area with 4 enclosed alcohol pads .  Use all pads to assure are is cleaned thoroughly.  Let dry.   Apply patch as indicated in monitor instructions.  Patch will be place under collarbone on left side of chest with arrow pointing upward.   Rub patch adhesive wings for 2 minutes.Remove white label marked 1.  Remove white label marked 2.  Rub patch adhesive wings for 2 additional minutes.   While looking in a mirror, press and release button in center of patch.  A small green light will flash 3-4 times .  This will be your only indicator the monitor has been turned on.     Do not shower for the first 24 hours.  You may shower after the first 24 hours.   Press button if you feel a symptom. You will hear a small click.  Record Date, Time and Symptom in the Patient Log Book.   When you are ready to remove patch, follow instructions on last 2 pages of Patient Log Book.  Stick patch monitor onto last page of Patient Log Book.   Place Patient Log Book in [place] box.  Use locking tab on box and tape box  closed securely.  The Orange and [employer] has [employer] on it.  Please place in mailbox as soon as possible.  Your physician should have your test results approximately 7 days after the monitor has been mailed back to [facility].   Call [facility] Customer Care at [phone] if you have questions regarding your ZIO XT patch monitor.  Call them immediately if you see an orange light blinking on your monitor.   If your monitor falls off in less than 4 days contact our Monitor department at [phone].  If your monitor becomes loose or falls off after 4 days call [employer] at [phone] for suggestions on securing your monitor    Follow-Up: At [facility], you and your health needs are our priority.  As part of our continuing mission to provide you with exceptional heart care, we have created designated Provider Care Teams.  These Care Teams include your primary Cardiologist (physician) and Advanced Practice Providers (APPs -  Physician Assistants and Nurse Practitioners) who all work together to provide you with the care you need, when you need it.  We recommend signing up for the patient portal called MyChart.  Sign up information is provided on this After Visit Summary.  MyChart is used to connect with patients for Virtual Visits (Telemedicine).  Patients are able to view lab/test results, encounter notes, upcoming  appointments, etc.  Non-urgent messages can be sent to your provider as well.   To learn more about what you can do with MyChart, go to [email or web address].    Your next appointment:    3-4 months  Provider:   Dr Francyne

## 2023-05-31 NOTE — Progress Notes (Unsigned)
 Enrolled patient for a 14 day Zio XT monitor to be mailed to patients home.   [place] to read

## 2023-06-15 ENCOUNTER — Other Ambulatory Visit: Payer: Self-pay | Admitting: Family

## 2023-06-15 DIAGNOSIS — F3289 Other specified depressive episodes: Secondary | ICD-10-CM

## 2023-06-15 NOTE — Telephone Encounter (Signed)
High Risk Warning Populated when attempting to refill, I will send to Provider for further review

## 2023-06-28 ENCOUNTER — Other Ambulatory Visit: Payer: Self-pay | Admitting: Family

## 2023-06-28 DIAGNOSIS — E039 Hypothyroidism, unspecified: Secondary | ICD-10-CM

## 2023-08-07 ENCOUNTER — Other Ambulatory Visit: Payer: Self-pay | Admitting: Hematology and Oncology

## 2023-08-20 ENCOUNTER — Other Ambulatory Visit: Payer: Self-pay | Admitting: Adult Health

## 2023-08-20 ENCOUNTER — Other Ambulatory Visit: Payer: Self-pay | Admitting: Family

## 2023-08-20 DIAGNOSIS — F419 Anxiety disorder, unspecified: Secondary | ICD-10-CM

## 2023-08-20 DIAGNOSIS — E876 Hypokalemia: Secondary | ICD-10-CM

## 2023-08-20 NOTE — Telephone Encounter (Signed)
 Pharmacy Requested refill.  Pended Rx and sent to [facility] for approval.

## 2023-08-20 NOTE — Telephone Encounter (Signed)
 Pharmacy Requested refill.  Epic LR: 05/11/2023 Contract Date: 04/27/2023  Pended Rx and sent to [facility] for approval.

## 2023-08-29 ENCOUNTER — Encounter: Payer: Self-pay | Admitting: Cardiovascular Disease

## 2023-08-29 ENCOUNTER — Ambulatory Visit: Payer: Medicare PPO | Attending: Cardiovascular Disease | Admitting: Cardiovascular Disease

## 2023-08-29 ENCOUNTER — Ambulatory Visit (INDEPENDENT_AMBULATORY_CARE_PROVIDER_SITE_OTHER)

## 2023-08-29 ENCOUNTER — Other Ambulatory Visit: Payer: Self-pay | Admitting: Family

## 2023-08-29 VITALS — BP 110/62 | HR 65 | Ht 64.0 in | Wt 132.8 lb

## 2023-08-29 DIAGNOSIS — K449 Diaphragmatic hernia without obstruction or gangrene: Secondary | ICD-10-CM | POA: Diagnosis not present

## 2023-08-29 DIAGNOSIS — Z79899 Other long term (current) drug therapy: Secondary | ICD-10-CM

## 2023-08-29 DIAGNOSIS — R0602 Shortness of breath: Secondary | ICD-10-CM

## 2023-08-29 DIAGNOSIS — I509 Heart failure, unspecified: Secondary | ICD-10-CM

## 2023-08-29 DIAGNOSIS — I700 Atherosclerosis of aorta: Secondary | ICD-10-CM

## 2023-08-29 DIAGNOSIS — R0609 Other forms of dyspnea: Secondary | ICD-10-CM

## 2023-08-29 DIAGNOSIS — D649 Anemia, unspecified: Secondary | ICD-10-CM | POA: Diagnosis not present

## 2023-08-29 DIAGNOSIS — I4819 Other persistent atrial fibrillation: Secondary | ICD-10-CM

## 2023-08-29 DIAGNOSIS — D6869 Other thrombophilia: Secondary | ICD-10-CM

## 2023-08-29 DIAGNOSIS — I2510 Atherosclerotic heart disease of native coronary artery without angina pectoris: Secondary | ICD-10-CM

## 2023-08-29 DIAGNOSIS — E038 Other specified hypothyroidism: Secondary | ICD-10-CM

## 2023-08-29 DIAGNOSIS — E7800 Pure hypercholesterolemia, unspecified: Secondary | ICD-10-CM

## 2023-08-29 DIAGNOSIS — I10 Essential (primary) hypertension: Secondary | ICD-10-CM

## 2023-08-29 MED ORDER — METOPROLOL TARTRATE 25 MG PO TABS
25.0000 mg | ORAL_TABLET | Freq: Two times a day (BID) | ORAL | 3 refills | Status: AC
Start: 2023-08-29 — End: ?

## 2023-08-29 NOTE — Progress Notes (Unsigned)
 Enrolled patient for a 3 day Zio XT monitor to be mailed to patients home

## 2023-08-29 NOTE — Patient Instructions (Signed)
 Medication Instructions:  Decrease Metoprolol Tartrate to 25 mg twice a day *If you need a refill on your cardiac medications before your next appointment, please call your pharmacy*  Lab Work: CBC, CMP, TSH, BNP If you have labs (blood work) drawn today and your tests are completely normal, you will receive your results only by: MyChart Message (if you have MyChart) OR A paper copy in the mail If you have any lab test that is abnormal or we need to change your treatment, we will call you to review the results.  Testing/Procedures: Your physician has requested that you have an echocardiogram. Echocardiography is a painless test that uses sound waves to create images of your heart. It provides your doctor with information about the size and shape of your heart and how well your heart's chambers and valves are working. This procedure takes approximately one hour. There are no restrictions for this procedure. Please do NOT wear cologne, perfume, aftershave, or lotions (deodorant is allowed). Please arrive 15 minutes prior to your appointment time.  Please note: We ask at that you not bring children with you during ultrasound (echo/ vascular) testing. Due to room size and safety concerns, children are not allowed in the ultrasound rooms during exams. Our front office staff cannot provide observation of children in our lobby area while testing is being conducted. An adult accompanying a patient to their appointment will only be allowed in the ultrasound room at the discretion of the ultrasound technician under special circumstances. We apologize for any inconvenience.  Your physician has recommended that you wear a 3 DAY ZIO-PATCH monitor. The Zio patch cardiac monitor continuously records heart rhythm data for up to 14 days, this is for patients being evaluated for multiple types heart rhythms. For the first 24 hours post application, please avoid getting the Zio monitor wet in the shower or by  excessive sweating during exercise. After that, feel free to carry on with regular activities. Keep soaps and lotions away from the ZIO XT Patch.  This will be mailed to you, please expect 7-10 days to receive.    Applying the monitor   Shave hair from upper left chest.   Hold abrader disc by orange tab.  Rub abrader in 40 strokes over left upper chest as indicated in your monitor instructions.   Clean area with 4 enclosed alcohol pads .  Use all pads to assure are is cleaned thoroughly.  Let dry.   Apply patch as indicated in monitor instructions.  Patch will be place under collarbone on left side of chest with arrow pointing upward.   Rub patch adhesive wings for 2 minutes.Remove white label marked "1".  Remove white label marked "2".  Rub patch adhesive wings for 2 additional minutes.   While looking in a mirror, press and release button in center of patch.  A small green light will flash 3-4 times .  This will be your only indicator the monitor has been turned on.     Do not shower for the first 24 hours.  You may shower after the first 24 hours.   Press button if you feel a symptom. You will hear a small click.  Record Date, Time and Symptom in the Patient Log Book.   When you are ready to remove patch, follow instructions on last 2 pages of Patient Log Book.  Stick patch monitor onto last page of Patient Log Book.   Place Patient Log Book in [place] box.  Use locking tab  on box and tape box closed securely.  The [employer] and [employer] has [employer] on it.  Please place in mailbox as soon as possible.  Your physician should have your test results approximately 7 days after the monitor has been mailed back to [facility].   Call [facility] Customer Care at [phone] if you have questions regarding your ZIO XT patch monitor.  Call them immediately if you see an orange light blinking on your monitor.   If your monitor falls off in less than 4 days contact our Monitor  department at [phone].  If your monitor becomes loose or falls off after 4 days call [employer] at [phone] for suggestions on securing your monitor    Follow-Up: At [facility], you and your health needs are our priority.  As part of our continuing mission to provide you with exceptional heart care, our providers are all part of one team.  This team includes your primary Cardiologist (physician) and Advanced Practice Providers or APPs (Physician Assistants and Nurse Practitioners) who all work together to provide you with the care you need, when you need it.  Your next appointment:   4-6 weeks with an APP  4-6 months with Dr Royann Shivers  We recommend signing up for the patient portal called "MyChart".  Sign up information is provided on this After Visit Summary.  MyChart is used to connect with patients for Virtual Visits (Telemedicine).  Patients are able to view lab/test results, encounter notes, upcoming appointments, etc.  Non-urgent messages can be sent to your provider as well.   To learn more about what you can do with MyChart, go to [email or web address].        1st Floor: - Lobby - Registration  - Pharmacy  - Lab - Cafe  2nd Floor: - PV Lab - Diagnostic Testing (echo, CT, nuclear med)  3rd Floor: - Vacant  4th Floor: - TCTS (cardiothoracic surgery) - AFib Clinic - Structural Heart Clinic - Vascular Surgery  - Vascular Ultrasound  5th Floor: - HeartCare Cardiology (general and EP) - Clinical Pharmacy for coumadin, hypertension, lipid, weight-loss medications, and med management appointments    Valet parking services will be available as well.

## 2023-08-29 NOTE — Progress Notes (Signed)
 Patient ID: SARAJEAN DESSERT, female   DOB: [birth year], 83 y.o.   MRN: [id number]     Cardiology Office Note    Date:  09/02/2023   ID:  AMIE COWENS, DOB 23-Mar-1941, MRN [id number]  PCP:  Estil Heman, NP  Cardiologist:  Luana Rumple, MD   Chief Complaint  Patient presents with   Shortness of Breath    History of Present Illness:  EVIE CROSTON is a 83 y.o. female with recurrent paroxysmal atrial fibrillation with rapid ventricular response, initially diagnosed in 2011, s/p previous cardioversion (August 19, 2015).  She has a history of bilateral breast cancer and she did undergo radiation therapy for left breast cancer in 2005.  She has noticed a gradual and steady worsening of her exercise tolerance.  She gets tired very easily and becomes short of breath.  She has noticed that if she tries to push herself to complete a task she develops symptoms of near syncope.  If she skips a dose of furosemide she develops severe edema, but as long as she takes that she has minimal swelling.  Her balance has deteriorated.  Her blood pressure has remained very well-controlled in the 110s/60s.  She has not had any problems with chest pain at rest or with activity.  She is on chronic anticoagulation with Eliquis and has not had any serious bleeding problems or any falls.  She has not had any focal neurological events.  For the third year in a row, she presents in atrial fibrillation with controlled ventricular response.  On the previous 2 occasions she appeared to be completely oblivious to the arrhythmia.  The patient specifically denies any chest pain at rest exertion, dyspnea at rest or with exertion, orthopnea, paroxysmal nocturnal dyspnea, syncope, palpitations, focal neurological deficits, intermittent claudication, unexplained weight gain, cough, hemoptysis or wheezing.  She had a low risk nuclear perfusion study with an ejection fraction of 70% in November 2024.  Echocardiogram performed in November  2024 as well showed LVEF 55 to 60% with normal regional wall motion and severe biatrial enlargement.  There were no serious valvular abnormalities.  Chest CT performed in January 2024 showed evidence of aortic atherosclerosis and coronary calcifications as well as a moderate to large hiatal hernia.  On my review of the images I would characterize the coronary calcifications as less than average for an octogenarian.  She continues to live alone and takes care of her own housework although she does have some help.  Her son and brother live next-door.  She has well treated hypertension and hypothyroidism and hypercholesterolemia. She has a remote history of left mastectomy and radiation therapy in 2005 and then underwent surgery without radiation or chemotherapy on the opposite breast in 2011. She has preserved left ventricular systolic function and a moderately dilated left atrium.  She had a normal nuclear perfusion study in November 2024.   Past Medical History:  Diagnosis Date   Anxiety    Atrial fibrillation with rapid ventricular response (HCC) 07/07/2015   Bilateral breast cancer (HCC)    Daily headache    Depression    GERD (gastroesophageal reflux disease)    Hyperlipidemia    Hypertension    Hypothyroidism    Iron deficiency anemia 2000s   Lumbago    Nausea alone    PONV (postoperative nausea and vomiting)    Senile osteoporosis    Squamous cell carcinoma of forehead    "I think they burned it off"    Past  Surgical History:  Procedure Laterality Date   ABDOMINAL HYSTERECTOMY  1970s   BREAST BIOPSY Bilateral    BREAST BIOPSY Right 05/18/2022   US  RT BREAST BX W LOC DEV 1ST LESION IMG BX SPEC US  GUIDE 05/18/2022 [facility]   BREAST LUMPECTOMY Right 2002   "w/lymph nodes removed"   CARDIOVERSION N/A 08/19/2015   Procedure: CARDIOVERSION;  Surgeon: Luana Rumple, MD;  Location: [facility];  Service: Cardiovascular;  Laterality: N/A;   CATARACT EXTRACTION W/  INTRAOCULAR LENS  IMPLANT, BILATERAL Bilateral 2000s   DILATION AND CURETTAGE OF UTERUS  ~ 1968   ELBOW FRACTURE SURGERY Left 2011   "has 2 screws in it"   EXCISIONAL HEMORRHOIDECTOMY  ~ 1970   FRACTURE SURGERY  02/12/2016   Right Wrist   HEMIARTHROPLASTY HIP Left 2011   "had ball replaced"   INGUINAL HERNIA REPAIR Right 1980s?   MASTECTOMY Left 2005   MASTECTOMY Right 06/2022   ORIF WRIST FRACTURE Left 02/12/2016   Procedure: OPEN REDUCTION INTERNAL FIXATION LEFT DISTAL RADIUS;  Surgeon: Mauricia South, MD;  Location: [facility] OR;  Service: Plastics;  Laterality: Left;   SIMPLE MASTECTOMY WITH AXILLARY SENTINEL NODE BIOPSY Right 06/22/2022   Procedure: RIGHT SIMPLE MASTECTOMY;  Surgeon: Lockie Rima, MD;  Location: [facility] OR;  Service: General;  Laterality: Right;   TOTAL THYROIDECTOMY  1972   WRIST FRACTURE SURGERY Right    "has a plate and 7 screws in there"    Current Medications: Outpatient Medications Prior to Visit  Medication Sig Dispense Refill   acetaminophen (TYLENOL) 325 MG tablet Take 2 tablets (650 mg total) by mouth every 4 (four) hours as needed for headache or mild pain.     albuterol (VENTOLIN HFA) 108 (90 Base) MCG/ACT inhaler Inhale 1 puff into the lungs every 6 (six) hours as needed for shortness of breath.     ALPRAZolam (XANAX) 0.5 MG tablet TAKE ONE TABLET BY MOUTH DAILY FOR ANXIETY 30 tablet 5   calcium citrate (CALCITRATE - DOSED IN MG ELEMENTAL CALCIUM) 950 (200 Ca) MG tablet Take 200 mg of elemental calcium by mouth daily.     Cholecalciferol (VITAMIN D3) 2000 UNITS TABS Take 2,000 Units by mouth daily.     docusate sodium (COLACE) 100 MG capsule Take 1 capsule (100 mg total) by mouth daily. 10 capsule 0   ELIQUIS 5 MG TABS tablet TAKE 1 TABLET BY MOUTH TWICE DAILY 180 tablet 1   furosemide (LASIX) 40 MG tablet TAKE 1 TABLET BY MOUTH DAILY 90 tablet 1   gabapentin (NEURONTIN) 100 MG capsule Take 1 capsule (100 mg total) by mouth 2 (two) times daily. 60 capsule 0    gabapentin (NEURONTIN) 100 MG capsule TAKE 1 CAPSULE BY MOUTH AT BEDTIME 90 capsule 1   ibuprofen (ADVIL) 200 MG tablet Take 200 mg by mouth daily as needed for headache or mild pain.     Iron, Ferrous Sulfate, 325 (65 Fe) MG TABS Take 325 mg by mouth daily. 30 tablet    levothyroxine (SYNTHROID) 137 MCG tablet TAKE 1 TABLET BY MOUTH DAILY 60 tablet 2   meclizine (ANTIVERT) 12.5 MG tablet Take 12.5 mg by mouth as needed for dizziness.     potassium chloride SA (KLOR-CON M) 20 MEQ tablet TAKE 2 TABLETS BY MOUTH DAILY 180 tablet 1   rosuvastatin (CRESTOR) 10 MG tablet TAKE 1 TABLET BY MOUTH DAILY 90 tablet 2   sertraline (ZOLOFT) 25 MG tablet TAKE 2 TABLETS BY MOUTH DAILY 180 tablet 1  tamoxifen (NOLVADEX) 20 MG tablet TAKE 1 TABLET BY MOUTH DAILY 90 tablet 3   metoprolol tartrate (LOPRESSOR) 50 MG tablet TAKE 1 TABLET BY MOUTH TWICE DAILY 90 tablet 3   No facility-administered medications prior to visit.     Allergies:   Patient has no known allergies.   Family History:  The patient's family history includes Alzheimer's disease in her brother; Bladder Cancer in her brother; COPD in her brother; Cancer in her father; Diabetes in her brother; Heart attack in her brother; Heart disease in her sister; Hypertension in her sister; Lung cancer in her sister; Mental illness in her son and son; Other in her brother, brother, and brother; Ovarian cancer in her sister; Skin cancer in her brother.   ROS:   Please see the history of present illness.    ROS All other systems are reviewed and are negative.   PHYSICAL EXAM:   VS:  BP 110/62 (BP Location: Left Arm, Patient Position: Sitting)   Pulse 65   Ht 5\' 4"  (1.626 m)   Wt 132 lb 12.8 oz (60.2 kg)   SpO2 95%   BMI 22.80 kg/m      General: Alert, oriented x3, no distress, appears worried, but looks comfortable at rest Head: no evidence of trauma, PERRL, EOMI, no exophtalmos or lid lag, no myxedema, no xanthelasma; normal ears, nose and  oropharynx Neck: normal jugular venous pulsations and no hepatojugular reflux; brisk carotid pulses without delay and no carotid bruits Chest: clear to auscultation, no signs of consolidation by percussion or palpation, normal fremitus, symmetrical and full respiratory excursions Cardiovascular: normal position and quality of the apical impulse, irregular rhythm, normal first and second heart sounds, no murmurs, rubs or gallops Abdomen: no tenderness or distention, no masses by palpation, no abnormal pulsatility or arterial bruits, normal bowel sounds, no hepatosplenomegaly Extremities: Trivial symmetrical ankle edema Neurological: grossly nonfocal Psych: Normal mood and affect    Wt Readings from Last 3 Encounters:  08/29/23 132 lb 12.8 oz (60.2 kg)  05/31/23 136 lb 12.8 oz (62.1 kg)  04/27/23 138 lb 3.2 oz (62.7 kg)      Studies/Labs Reviewed:   EKG:    EKG Interpretation Date/Time:  Wednesday August 29 2023 11:27:52 EDT Ventricular Rate:  65 PR Interval:    QRS Duration:  76 QT Interval:  412 QTC Calculation: 428 R Axis:   -9  Text Interpretation: Atrial fibrillation Minimal voltage criteria for LVH, may be normal variant ( R in aVL ) When compared with ECG of 20-Mar-2023 15:34, No significant change was found Confirmed by Erastus Bartolomei ([id number]) on 08/30/2023 10:49:56 AM         Recent Labs: 08/29/2023: ALT 10; BNP 234.7; BUN 16; Creatinine, Ser 1.40; Hemoglobin 14.6; Platelets 150; Potassium 4.4; Sodium 142; TSH 0.585   Lipid Panel    Component Value Date/Time   CHOL 138 04/27/2023 1121   CHOL 155 11/22/2015 0858   TRIG 134 04/27/2023 1121   HDL 54 04/27/2023 1121   HDL 46 11/22/2015 0858   CHOLHDL 2.6 04/27/2023 1121   VLDL 17 07/08/2015 0535   LDLCALC 62 04/27/2023 1121    ASSESSMENT:    1. Persistent atrial fibrillation (HCC)   2. Primary hypertension   3. Shortness of breath   4. Chronic anemia   5. Other specified hypothyroidism   6. Medication  management      PLAN:  In order of problems listed above:  Exertional dyspnea/fatigue: She describes symptoms of decompensated heart  failure although she does not appear to be grossly hypervolemic.  The fatigue and the weakness seems to be more prominent than the shortness of breath.  Check an arrhythmia monitor to make sure she does not have either excessive bradycardia from her metoprolol or poor ventricular rate control.  She has a recent nuclear perfusion study and echocardiogram that showed normal left ventricular systolic function and no significant valvular abnormalities.  Will update her echocardiogram.  If there are regional wall motion or if the EF has decreased, she should have invasive coronary angiography.  If if not and we still cannot find an explanation for symptoms, consider CT angiography.  With severe biatrial enlargement, consider restrictive cardiomyopathy and testing for amyloidosis with a technetium 60m PYP scan.  Will also check for anemia, hypothyroidism or major electrolyte imbalances.  Check BNP. AFib: Well rate controlled (at least at rest), on anticoagulation, always asymptomatic in the past.  Never aware of palpitations.  Not sure we can consider the arrhythmia as a cause for her symptoms, but since she now has shortness of breath, will we might look into seeing if she would benefit from cardioversion and antiarrhythmic therapy, but long-term maintenance of sinus rhythm will be likely low yield with severe biatrial enlargement..  First we need to confirm that this is a persistent arrhythmia, since in the past it was paroxysmal.  Will also establish whether or not she has good rate control with activity, with a monitor.  On anticoagulation (CHADSVasc score 4: age 78, gender, HTN). Coronary artery calcification/aortic atherosclerosis: These are described on the CT of the chest performed in January 2024 and are not surprising at age 81, but on my review the amount of coronary  calcification is probably less than average for a typical octogenarian.  However, she is at increased risk for proximal coronary disease with history of left chest radiation therapy following left mastectomy and 2005. Moderate to large hiatal hernia: This could also cause shortness of breath due to restriction of her ventilation. Anticoagulation: Has not had any bleeding complications.  No history of stroke or TIA. HTN: In the past she was also taking amlodipine, but now her blood pressure is well-controlled on metoprolol monotherapy. HLP: LDL in target range even if she does have CAD (LDL under 70).  Continue rosuvastatin.    Medication Adjustments/Labs and Tests Ordered: Current medicines are reviewed at length with the patient today.  Concerns regarding medicines are outlined above.  Medication changes, Labs and Tests ordered today are listed in the Patient Instructions below. Patient Instructions  Medication Instructions:  Decrease Metoprolol Tartrate to 25 mg twice a day *If you need a refill on your cardiac medications before your next appointment, please call your pharmacy*  Lab Work: CBC, CMP, TSH, BNP If you have labs (blood work) drawn today and your tests are completely normal, you will receive your results only by: MyChart Message (if you have MyChart) OR A paper copy in the mail If you have any lab test that is abnormal or we need to change your treatment, we will call you to review the results.  Testing/Procedures: Your physician has requested that you have an echocardiogram. Echocardiography is a painless test that uses sound waves to create images of your heart. It provides your doctor with information about the size and shape of your heart and how well your heart's chambers and valves are working. This procedure takes approximately one hour. There are no restrictions for this procedure. Please do NOT wear cologne,  perfume, aftershave, or lotions (deodorant is  allowed). Please arrive 15 minutes prior to your appointment time.  Please note: We ask at that you not bring children with you during ultrasound (echo/ vascular) testing. Due to room size and safety concerns, children are not allowed in the ultrasound rooms during exams. Our front office staff cannot provide observation of children in our lobby area while testing is being conducted. An adult accompanying a patient to their appointment will only be allowed in the ultrasound room at the discretion of the ultrasound technician under special circumstances. We apologize for any inconvenience.  Your physician has recommended that you wear a 3 DAY ZIO-PATCH monitor. The Zio patch cardiac monitor continuously records heart rhythm data for up to 14 days, this is for patients being evaluated for multiple types heart rhythms. For the first 24 hours post application, please avoid getting the Zio monitor wet in the shower or by excessive sweating during exercise. After that, feel free to carry on with regular activities. Keep soaps and lotions away from the ZIO XT Patch.  This will be mailed to you, please expect 7-10 days to receive.    Applying the monitor   Shave hair from upper left chest.   Hold abrader disc by orange tab.  Rub abrader in 40 strokes over left upper chest as indicated in your monitor instructions.   Clean area with 4 enclosed alcohol pads .  Use all pads to assure are is cleaned thoroughly.  Let dry.   Apply patch as indicated in monitor instructions.  Patch will be place under collarbone on left side of chest with arrow pointing upward.   Rub patch adhesive wings for 2 minutes.Remove white label marked "1".  Remove white label marked "2".  Rub patch adhesive wings for 2 additional minutes.   While looking in a mirror, press and release button in center of patch.  A small green light will flash 3-4 times .  This will be your only indicator the monitor has been turned on.     Do not  shower for the first 24 hours.  You may shower after the first 24 hours.   Press button if you feel a symptom. You will hear a small click.  Record Date, Time and Symptom in the Patient Log Book.   When you are ready to remove patch, follow instructions on last 2 pages of Patient Log Book.  Stick patch monitor onto last page of Patient Log Book.   Place Patient Log Book in [place] box.  Use locking tab on box and tape box closed securely.  The [employer] and [employer] has [employer] on it.  Please place in mailbox as soon as possible.  Your physician should have your test results approximately 7 days after the monitor has been mailed back to [facility].   Call [facility] Customer Care at [phone] if you have questions regarding your ZIO XT patch monitor.  Call them immediately if you see an orange light blinking on your monitor.   If your monitor falls off in less than 4 days contact our Monitor department at [phone].  If your monitor becomes loose or falls off after 4 days call [employer] at [phone] for suggestions on securing your monitor    Follow-Up: At [facility], you and your health needs are our priority.  As part of our continuing mission to provide you with exceptional heart care, our providers are all part of one team.  This team  includes your primary Cardiologist (physician) and Advanced Practice Providers or APPs (Physician Assistants and Nurse Practitioners) who all work together to provide you with the care you need, when you need it.  Your next appointment:   4-6 weeks with an APP  4-6 months with Dr Alvis Ba  We recommend signing up for the patient portal called "MyChart".  Sign up information is provided on this After Visit Summary.  MyChart is used to connect with patients for Virtual Visits (Telemedicine).  Patients are able to view lab/test results, encounter notes, upcoming appointments, etc.  Non-urgent messages can be sent to your provider as  well.   To learn more about what you can do with MyChart, go to [email or web address].        1st Floor: - Lobby - Registration  - Pharmacy  - Lab - Cafe  2nd Floor: - PV Lab - Diagnostic Testing (echo, CT, nuclear med)  3rd Floor: - Vacant  4th Floor: - TCTS (cardiothoracic surgery) - AFib Clinic - Structural Heart Clinic - Vascular Surgery  - Vascular Ultrasound  5th Floor: - HeartCare Cardiology (general and EP) - Clinical Pharmacy for coumadin, hypertension, lipid, weight-loss medications, and med management appointments    Valet parking services will be available as well.      Signed, Luana Rumple, MD  09/02/2023 5:33 PM    [facility] [address] [place], [place], [place]  [address] Phone: [phone]; Fax: [phone]

## 2023-08-30 LAB — CBC
Hematocrit: 44.3 % (ref 34.0–46.6)
Hemoglobin: 14.6 g/dL (ref 11.1–15.9)
MCH: 30.2 pg (ref 26.6–33.0)
MCHC: 33 g/dL (ref 31.5–35.7)
MCV: 92 fL (ref 79–97)
Platelets: 150 10*3/uL (ref 150–450)
RBC: 4.83 x10E6/uL (ref 3.77–5.28)
RDW: 12.4 % (ref 11.7–15.4)
WBC: 8 10*3/uL (ref 3.4–10.8)

## 2023-08-30 LAB — COMPREHENSIVE METABOLIC PANEL WITH GFR
ALT: 10 IU/L (ref 0–32)
AST: 16 IU/L (ref 0–40)
Albumin: 4.3 g/dL (ref 3.7–4.7)
Alkaline Phosphatase: 73 IU/L (ref 44–121)
BUN/Creatinine Ratio: 11 — ABNORMAL LOW (ref 12–28)
BUN: 16 mg/dL (ref 8–27)
Bilirubin Total: 0.5 mg/dL (ref 0.0–1.2)
CO2: 25 mmol/L (ref 20–29)
Calcium: 9.4 mg/dL (ref 8.7–10.3)
Chloride: 100 mmol/L (ref 96–106)
Creatinine, Ser: 1.4 mg/dL — ABNORMAL HIGH (ref 0.57–1.00)
Globulin, Total: 2.6 g/dL (ref 1.5–4.5)
Glucose: 90 mg/dL (ref 70–99)
Potassium: 4.4 mmol/L (ref 3.5–5.2)
Sodium: 142 mmol/L (ref 134–144)
Total Protein: 6.9 g/dL (ref 6.0–8.5)
eGFR: 37 mL/min/{1.73_m2} — ABNORMAL LOW (ref >59)

## 2023-08-30 LAB — BRAIN NATRIURETIC PEPTIDE: BNP: 234.7 pg/mL — ABNORMAL HIGH (ref 0.0–100.0)

## 2023-08-30 LAB — TSH: TSH: 0.585 u[IU]/mL (ref 0.450–4.500)

## 2023-09-02 ENCOUNTER — Encounter: Payer: Self-pay | Admitting: Cardiovascular Disease

## 2023-09-06 ENCOUNTER — Other Ambulatory Visit: Payer: Self-pay | Admitting: Family

## 2023-09-06 ENCOUNTER — Encounter: Payer: Self-pay | Admitting: Family

## 2023-09-06 ENCOUNTER — Ambulatory Visit: Payer: Medicare PPO | Admitting: Family

## 2023-09-06 VITALS — BP 108/70 | HR 76 | Temp 98.0°F | Resp 21 | Ht 64.0 in | Wt 132.8 lb

## 2023-09-06 DIAGNOSIS — Z0000 Encounter for general adult medical examination without abnormal findings: Secondary | ICD-10-CM | POA: Diagnosis not present

## 2023-09-06 DIAGNOSIS — R2681 Unsteadiness on feet: Secondary | ICD-10-CM

## 2023-09-06 NOTE — Patient Instructions (Signed)
 Deborah Randall , Thank you for taking time to come for your Medicare Wellness Visit. I appreciate your ongoing commitment to your health goals. Please review the following plan we discussed and let me know if I can assist you in the future.   Screening recommendations/referrals: Colonoscopy : N/A  Mammogram N/A  Bone Density : Up to date  Recommended yearly ophthalmology/optometry visit for glaucoma screening and checkup Recommended yearly dental visit for hygiene and checkup  Vaccinations: Influenza vaccine- due annually in September/October Pneumococcal vaccine ; Up to date  Tdap vaccine ; Up to date  Shingles vaccine ; Up to date     Advanced directives: Yes  Conditions/risks identified: advanced age (>6men, >43 women);hypertension;sedentary lifestyle  Next appointment: 1 year    Preventive Care 32 Years and Older, Female Preventive care refers to lifestyle choices and visits with your health care provider that can promote health and wellness. What does preventive care include? A yearly physical exam. This is also called an annual well check. Dental exams once or twice a year. Routine eye exams. Ask your health care provider how often you should have your eyes checked. Personal lifestyle choices, including: Daily care of your teeth and gums. Regular physical activity. Eating a healthy diet. Avoiding tobacco and drug use. Limiting alcohol use. Practicing safe sex. Taking low-dose aspirin every day. Taking vitamin and mineral supplements as recommended by your health care provider. What happens during an annual well check? The services and screenings done by your health care provider during your annual well check will depend on your age, overall health, lifestyle risk factors, and family history of disease. Counseling  Your health care provider may ask you questions about your: Alcohol use. Tobacco use. Drug use. Emotional well-being. Home and relationship  well-being. Sexual activity. Eating habits. History of falls. Memory and ability to understand (cognition). Work and work Astronomer. Reproductive health. Screening  You may have the following tests or measurements: Height, weight, and BMI. Blood pressure. Lipid and cholesterol levels. These may be checked every 5 years, or more frequently if you are over 64 years old. Skin check. Lung cancer screening. You may have this screening every year starting at age 57 if you have a 30-pack-year history of smoking and currently smoke or have quit within the past 15 years. Fecal occult blood test (FOBT) of the stool. You may have this test every year starting at age 38. Flexible sigmoidoscopy or colonoscopy. You may have a sigmoidoscopy every 5 years or a colonoscopy every 10 years starting at age 82. Hepatitis C blood test. Hepatitis B blood test. Sexually transmitted disease (STD) testing. Diabetes screening. This is done by checking your blood sugar (glucose) after you have not eaten for a while (fasting). You may have this done every 1-3 years. Bone density scan. This is done to screen for osteoporosis. You may have this done starting at age 10. Mammogram. This may be done every 1-2 years. Talk to your health care provider about how often you should have regular mammograms. Talk with your health care provider about your test results, treatment options, and if necessary, the need for more tests. Vaccines  Your health care provider may recommend certain vaccines, such as: Influenza vaccine. This is recommended every year. Tetanus, diphtheria, and acellular pertussis (Tdap, Td) vaccine. You may need a Td booster every 10 years. Zoster vaccine. You may need this after age 35. Pneumococcal 13-valent conjugate (PCV13) vaccine. One dose is recommended after age 38. Pneumococcal polysaccharide (PPSV23) vaccine. One  dose is recommended after age 38. Talk to your health care provider about which  screenings and vaccines you need and how often you need them. This information is not intended to replace advice given to you by your health care provider. Make sure you discuss any questions you have with your health care provider. Document Released: 06/04/2015 Document Revised: 01/26/2016 Document Reviewed: 03/09/2015 Elsevier Interactive Patient Education  2017 [employer].  Fall Prevention in the Home Falls can cause injuries. They can happen to people of all ages. There are many things you can do to make your home safe and to help prevent falls. What can I do on the outside of my home? Regularly fix the edges of walkways and driveways and fix any cracks. Remove anything that might make you trip as you walk through a door, such as a raised step or threshold. Trim any bushes or trees on the path to your home. Use bright outdoor lighting. Clear any walking paths of anything that might make someone trip, such as rocks or tools. Regularly check to see if handrails are loose or broken. Make sure that both sides of any steps have handrails. Any raised decks and porches should have guardrails on the edges. Have any leaves, snow, or ice cleared regularly. Use sand or salt on walking paths during winter. Clean up any spills in your garage right away. This includes oil or grease spills. What can I do in the bathroom? Use night lights. Install grab bars by the toilet and in the tub and shower. Do not use towel bars as grab bars. Use non-skid mats or decals in the tub or shower. If you need to sit down in the shower, use a plastic, non-slip stool. Keep the floor dry. Clean up any water that spills on the floor as soon as it happens. Remove soap buildup in the tub or shower regularly. Attach bath mats securely with double-sided non-slip rug tape. Do not have throw rugs and other things on the floor that can make you trip. What can I do in the bedroom? Use night lights. Make sure that you have a  light by your bed that is easy to reach. Do not use any sheets or blankets that are too big for your bed. They should not hang down onto the floor. Have a firm chair that has side arms. You can use this for support while you get dressed. Do not have throw rugs and other things on the floor that can make you trip. What can I do in the kitchen? Clean up any spills right away. Avoid walking on wet floors. Keep items that you use a lot in easy-to-reach places. If you need to reach something above you, use a strong step stool that has a grab bar. Keep electrical cords out of the way. Do not use floor polish or wax that makes floors slippery. If you must use wax, use non-skid floor wax. Do not have throw rugs and other things on the floor that can make you trip. What can I do with my stairs? Do not leave any items on the stairs. Make sure that there are handrails on both sides of the stairs and use them. Fix handrails that are broken or loose. Make sure that handrails are as long as the stairways. Check any carpeting to make sure that it is firmly attached to the stairs. Fix any carpet that is loose or worn. Avoid having throw rugs at the top or bottom of the  stairs. If you do have throw rugs, attach them to the floor with carpet tape. Make sure that you have a light switch at the top of the stairs and the bottom of the stairs. If you do not have them, ask someone to add them for you. What else can I do to help prevent falls? Wear shoes that: Do not have high heels. Have rubber bottoms. Are comfortable and fit you well. Are closed at the toe. Do not wear sandals. If you use a stepladder: Make sure that it is fully opened. Do not climb a closed stepladder. Make sure that both sides of the stepladder are locked into place. Ask someone to hold it for you, if possible. Clearly mark and make sure that you can see: Any grab bars or handrails. First and last steps. Where the edge of each step  is. Use tools that help you move around (mobility aids) if they are needed. These include: Canes. Walkers. Scooters. Crutches. Turn on the lights when you go into a dark area. Replace any light bulbs as soon as they burn out. Set up your furniture so you have a clear path. Avoid moving your furniture around. If any of your floors are uneven, fix them. If there are any pets around you, be aware of where they are. Review your medicines with your doctor. Some medicines can make you feel dizzy. This can increase your chance of falling. Ask your doctor what other things that you can do to help prevent falls. This information is not intended to replace advice given to you by your health care provider. Make sure you discuss any questions you have with your health care provider. Document Released: 03/04/2009 Document Revised: 10/14/2015 Document Reviewed: 06/12/2014 Elsevier Interactive Patient Education  2017 [employer].

## 2023-09-06 NOTE — Progress Notes (Signed)
 Subjective:   Deborah Randall is a 83 y.o. female who presents for Medicare Annual (Subsequent) preventive examination.  Visit Complete: In person  Patient Medicare AWV questionnaire was completed by the patient on 09/06/2023; I have confirmed that all information answered by patient is correct and no changes since this date.  Cardiac Risk Factors include: advanced age (>42men, >20 women);hypertension;sedentary lifestyle     Objective:    Today's Vitals   09/06/23 0837  BP: 108/70  Pulse: 76  Resp: (!) 21  Temp: 98 F (36.7 C)  SpO2: 99%  Weight: 132 lb 12.8 oz (60.2 kg)  Height: 5\' 4"  (1.626 m)   Body mass index is 22.8 kg/m.     12/18/2022    9:05 AM 09/05/2022   10:58 AM 06/21/2022   10:21 AM 06/08/2022   12:32 PM 06/07/2022    3:04 PM 05/01/2022    9:32 AM 04/25/2022   10:10 AM  Advanced Directives  Does Patient Have a Medical Advance Directive? Yes Yes Yes Yes Yes Yes Yes  Type of Advance Directive Living will Living will;Healthcare Power of Attorney Living will Living will Healthcare Power of [employer] Power of Attorney Healthcare Power of Attorney  Does patient want to make changes to medical advance directive?  No - Patient declined No - Patient declined  No - Patient declined No - Patient declined No - Patient declined  Copy of Healthcare Power of Attorney in Chart?  No - copy requested    No - copy requested No - copy requested    Current Medications (verified) Outpatient Encounter Medications as of 09/06/2023  Medication Sig   acetaminophen (TYLENOL) 325 MG tablet Take 2 tablets (650 mg total) by mouth every 4 (four) hours as needed for headache or mild pain.   albuterol (VENTOLIN HFA) 108 (90 Base) MCG/ACT inhaler Inhale 1 puff into the lungs every 6 (six) hours as needed for shortness of breath.   ALPRAZolam (XANAX) 0.5 MG tablet TAKE ONE TABLET BY MOUTH DAILY FOR ANXIETY   calcium citrate (CALCITRATE - DOSED IN MG ELEMENTAL CALCIUM) 950 (200 Ca) MG  tablet Take 200 mg of elemental calcium by mouth daily.   Cholecalciferol (VITAMIN D3) 2000 UNITS TABS Take 2,000 Units by mouth daily.   docusate sodium (COLACE) 100 MG capsule Take 1 capsule (100 mg total) by mouth daily.   ELIQUIS 5 MG TABS tablet TAKE 1 TABLET BY MOUTH TWICE DAILY   furosemide (LASIX) 40 MG tablet TAKE 1 TABLET BY MOUTH DAILY   gabapentin (NEURONTIN) 100 MG capsule Take 1 capsule (100 mg total) by mouth 2 (two) times daily.   gabapentin (NEURONTIN) 100 MG capsule TAKE 1 CAPSULE BY MOUTH AT BEDTIME   ibuprofen (ADVIL) 200 MG tablet Take 200 mg by mouth daily as needed for headache or mild pain.   Iron, Ferrous Sulfate, 325 (65 Fe) MG TABS Take 325 mg by mouth daily.   levothyroxine (SYNTHROID) 137 MCG tablet TAKE 1 TABLET BY MOUTH DAILY   meclizine (ANTIVERT) 12.5 MG tablet Take 12.5 mg by mouth as needed for dizziness.   metoprolol tartrate (LOPRESSOR) 25 MG tablet Take 1 tablet (25 mg total) by mouth 2 (two) times daily.   potassium chloride SA (KLOR-CON M) 20 MEQ tablet TAKE 2 TABLETS BY MOUTH DAILY   rosuvastatin (CRESTOR) 10 MG tablet TAKE 1 TABLET BY MOUTH DAILY   sertraline (ZOLOFT) 25 MG tablet TAKE 2 TABLETS BY MOUTH DAILY   tamoxifen (NOLVADEX) 20 MG tablet TAKE  1 TABLET BY MOUTH DAILY   No facility-administered encounter medications on file as of 09/06/2023.    Allergies (verified) Patient has no known allergies.   History: Past Medical History:  Diagnosis Date   Anxiety    Atrial fibrillation with rapid ventricular response (HCC) 07/07/2015   Bilateral breast cancer (HCC)    Daily headache    Depression    GERD (gastroesophageal reflux disease)    Hyperlipidemia    Hypertension    Hypothyroidism    Iron deficiency anemia 2000s   Lumbago    Nausea alone    PONV (postoperative nausea and vomiting)    Senile osteoporosis    Squamous cell carcinoma of forehead    "I think they burned it off"   Past Surgical History:  Procedure Laterality Date    ABDOMINAL HYSTERECTOMY  1970s   BREAST BIOPSY Bilateral    BREAST BIOPSY Right 05/18/2022   [place] RT BREAST BX W LOC DEV 1ST LESION IMG BX SPEC US GUIDE 05/18/2022 [facility]   BREAST LUMPECTOMY Right 2002   "w/lymph nodes removed"   CARDIOVERSION N/A 08/19/2015   Procedure: CARDIOVERSION;  Surgeon: Thurmon Fair, MD;  Location: [facility];  Service: Cardiovascular;  Laterality: N/A;   CATARACT EXTRACTION W/ INTRAOCULAR LENS  IMPLANT, BILATERAL Bilateral 2000s   DILATION AND CURETTAGE OF UTERUS  ~ 1968   ELBOW FRACTURE SURGERY Left 2011   "has 2 screws in it"   EXCISIONAL HEMORRHOIDECTOMY  ~ 1970   FRACTURE SURGERY  02/12/2016   Right Wrist   HEMIARTHROPLASTY HIP Left 2011   "had ball replaced"   INGUINAL HERNIA REPAIR Right 1980s?   MASTECTOMY Left 2005   MASTECTOMY Right 06/2022   ORIF WRIST FRACTURE Left 02/12/2016   Procedure: OPEN REDUCTION INTERNAL FIXATION LEFT DISTAL RADIUS;  Surgeon: Knute Neu, MD;  Location: [facility] OR;  Service: Plastics;  Laterality: Left;   SIMPLE MASTECTOMY WITH AXILLARY SENTINEL NODE BIOPSY Right 06/22/2022   Procedure: RIGHT SIMPLE MASTECTOMY;  Surgeon: Almond Lint, MD;  Location: [facility] OR;  Service: General;  Laterality: Right;   TOTAL THYROIDECTOMY  1972   WRIST FRACTURE SURGERY Right    "has a plate and 7 screws in there"   Family History  Problem Relation Age of Onset   Cancer Father    Lung cancer Sister    Heart disease Sister    Hypertension Sister    Ovarian cancer Sister    Heart attack Brother    Other Brother    Skin cancer Brother    Other Brother    Other Brother    Alzheimer's disease Brother    Bladder Cancer Brother    Diabetes Brother    COPD Brother    Mental illness Son    Mental illness Son    Social History   Socioeconomic History   Marital status: Widowed    Spouse name: Not on file   Number of children: Not on file   Years of education: Not on file   Highest education level: Not on file  Occupational  History   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   Tobacco Use   Smoking status: Never   Smokeless tobacco: Never  Vaping Use   Vaping status: Never Used  Substance and Sexual Activity   Alcohol use: No   Drug use: No   Sexual activity: Never  Other Topics Concern   Not on file  Social History Narrative   Lives alone   Widow   2  sons , one son decease, one son disable    Works at [employer] in [employer]   Enjoys going places with friends   Never smoked   Alcohol none   Exercise -walk, works in yard    Left handed    Social Drivers of Health   Financial Resource Strain: Low Risk  (05/16/2017)   Overall Financial Resource Strain (CARDIA)    Difficulty of Paying Living Expenses: Not hard at all  Food Insecurity: No Food Insecurity (06/20/2022)   Hunger Vital Sign    Worried About Running Out of Food in the Last Year: Never true    Ran Out of Food in the Last Year: Never true  Transportation Needs: No Transportation Needs (06/20/2022)   PRAPARE - Administrator, Civil Service (Medical): No    Lack of Transportation (Non-Medical): No  Physical Activity: Inactive (05/16/2017)   Exercise Vital Sign    Days of Exercise per Week: 0 days    Minutes of Exercise per Session: 0 min  Stress: No Stress Concern Present (05/16/2017)   [employer] of Occupational Health - Occupational Stress Questionnaire    Feeling of Stress : Not at all  Social Connections: Moderately Integrated (05/16/2017)   Social Connection and Isolation Panel [NHANES]    Frequency of Communication with Friends and Family: More than three times a week    Frequency of Social Gatherings with Friends and Family: Three times a week    Attends Religious Services: 1 to 4 times per year    Active Member of Clubs or Organizations: Yes    Attends Banker Meetings: Never    Marital Status: Widowed    Tobacco Counseling Counseling given: Not Answered   Clinical  Intake:  Pre-visit preparation completed: No  Pain : 0-10 Pain Score:  (no pain at rest but worst with walking) Pain Type: Chronic pain Pain Location: Hip (right shoulder) Pain Orientation: Right Pain Radiating Towards: elbow Pain Descriptors / Indicators: Aching Pain Onset: More than a month ago Pain Frequency: Constant Pain Relieving Factors: asprin Effect of Pain on Daily Activities: walking and use of right hand  Pain Relieving Factors: asprin  BMI - recorded: 22.8 Nutritional Status: BMI of 19-24  Normal Nutritional Risks:  (occasionally nausea) Diabetes: No  How often do you need to have someone help you when you read instructions, pamphlets, or other written materials from your doctor or pharmacy?: 3 - Sometimes What is the last grade level you completed in school?: 12 grade         Activities of Daily Living    09/06/2023   10:37 AM  In your present state of health, do you have any difficulty performing the following activities:  Hearing? 0  Vision? 1  Difficulty concentrating or making decisions? 1  Comment Remembering  Walking or climbing stairs? 1  Comment walks with a cane  Dressing or bathing? 1  Comment right shoulder limited ROM  Doing errands, shopping? 0  Preparing Food and eating ? N  Using the Toilet? N  In the past six months, have you accidently leaked urine? N  Do you have problems with loss of bowel control? N  Managing your Medications? N  Managing your Finances? N  Housekeeping or managing your Housekeeping? N    Patient Care Team: Kealey Kemmer, Donalee Citrin, NP as PCP - General (Family Medicine) Croitoru, Rachelle Hora, MD as PCP - Cardiology (Cardiology) [place], Amy, MD as Consulting Physician (Dermatology) Marcelino Duster, [place] as  Physician Assistant (Cardiology) Murleen Arms, MD as Consulting Physician (Hematology and Oncology) Lockie Rima, MD as Consulting Physician (General Surgery)  Indicate any recent Medical Services you may have  received from other than [facility] providers in the past year (date may be approximate).     Assessment:   This is a routine wellness examination for Deborah Randall.  Hearing/Vision screen Hearing Screening - Comments:: No hearing issues Vision Screening - Comments::  Dr Roslynn Coombes   Goals Addressed             This Visit's Progress    Increase water intake   Not on track    Starting 03/02/16, I will attempt to drink 3 bottles of water per day, instead of 1-2 bottles.     Patient Stated   Not on track    Walk good a gain        Depression Screen    09/06/2023   10:20 AM 09/05/2022   11:00 AM 09/05/2022   10:59 AM 05/31/2021    2:54 PM 05/27/2020   11:09 AM 01/19/2020   10:48 AM 01/15/2020    9:22 AM  PHQ 2/9 Scores  PHQ - 2 Score 0 0 0 0 0 0 0  PHQ- 9 Score  1         Fall Risk    09/06/2023   10:19 AM 04/27/2023   10:11 AM 12/18/2022    9:04 AM 10/26/2022    9:59 AM 09/05/2022   10:59 AM  Fall Risk   Falls in the past year? 0 0 0 0 1  Number falls in past yr: 0 0 0 0 0  Injury with Fall? 0 0 0 0 1  Risk for fall due to : Impaired balance/gait;Impaired mobility No Fall Risks  No Fall Risks History of fall(s);Impaired mobility;Impaired balance/gait  Follow up Falls evaluation completed Falls evaluation completed Falls evaluation completed Falls evaluation completed Falls evaluation completed;Education provided;Falls prevention discussed    MEDICARE RISK AT HOME: Medicare Risk at Home Any stairs in or around the home?: Yes If so, are there any without handrails?: No Home free of loose throw rugs in walkways, pet beds, electrical cords, etc?: Yes Adequate lighting in your home to reduce risk of falls?: Yes Life alert?: No Use of a cane, walker or w/c?: Yes Grab bars in the bathroom?: Yes Shower chair or bench in shower?: Yes Elevated toilet seat or a handicapped toilet?: No  TIMED UP AND GO:  Was the test performed?  Yes  Length of time to ambulate 10 feet: 35 sec Gait slow and  steady with assistive device    Cognitive Function:    09/05/2022   11:05 AM 05/16/2017   10:29 AM 03/02/2016    1:29 PM 02/19/2015    9:03 AM 09/25/2013    8:43 AM  MMSE - Mini Mental State Exam  Orientation to time 5 5 5 5 5   Orientation to Place 5 5 5 5 5   Registration 3 3 3 3 3   Attention/ Calculation 5 5 5 5 5   Recall 3 1 3 3 2   Language- name 2 objects 2 2 2 2 2   Language- repeat 1 1 1 1 1   Language- follow 3 step command 3 3 3 3 3   Language- read & follow direction 1 1 1 1 1   Write a sentence 1 1 1 1 1   Copy design 1 1  1 1   Total score 30 28  30  29  09/06/2023   10:20 AM 05/31/2021    2:55 PM 05/27/2020   11:10 AM 05/26/2019   10:06 AM  6CIT Screen  What Year? 0 points 0 points 0 points 0 points  What month? 0 points 0 points 0 points 0 points  What time? 0 points 0 points 0 points 0 points  Count back from 20 0 points 0 points 0 points 0 points  Months in reverse 2 points 4 points 0 points 0 points  Repeat phrase 0 points 0 points 2 points 0 points  Total Score 2 points 4 points 2 points 0 points    Immunizations Immunization History  Administered Date(s) Administered   Fluad Quad(high Dose 65+) 03/20/2019, 06/03/2020, 03/31/2021, 04/25/2022   Fluad Trivalent(High Dose 65+) 04/27/2023   Influenza, High Dose Seasonal PF 02/08/2017, 04/04/2018   Influenza,inj,Quad PF,6+ Mos 03/14/2013, 03/26/2014, 02/19/2015, 03/02/2016   PFIZER(Purple Top)SARS-COV-2 Vaccination 07/21/2019, 08/13/2019, 07/01/2020   Pneumococcal Conjugate-13 08/02/2013, 08/03/2014   Pneumococcal Polysaccharide-23 05/22/2009   Td 10/22/2017   Zoster Recombinant(Shingrix) 05/20/2018, 11/06/2018   Zoster, Live 09/25/2013    TDAP status: Up to date  Flu Vaccine status: Up to date  Pneumococcal vaccine status: Up to date  Covid-19 vaccine status: Information provided on how to obtain vaccines.   Qualifies for Shingles Vaccine? Yes   Zostavax completed No   Shingrix Completed?:  Yes  Screening Tests Health Maintenance  Topic Date Due   COVID-19 Vaccine (4 - 2024-25 season) 01/21/2023   INFLUENZA VACCINE  12/21/2023   Medicare Annual Wellness (AWV)  09/05/2024   DTaP/Tdap/Td (2 - Tdap) 10/23/2027   Pneumonia Vaccine 34+ Years old  Completed   DEXA SCAN  Completed   Zoster Vaccines- Shingrix  Completed   HPV VACCINES  Aged Out   Meningococcal B Vaccine  Aged Out    Health Maintenance  Health Maintenance Due  Topic Date Due   COVID-19 Vaccine (4 - 2024-25 season) 01/21/2023    Colorectal cancer screening: No longer required.   Mammogram status: No longer required due to advance age .  Bone Density status: Completed 11/24/2021. Results reflect: Bone density results: OSTEOPOROSIS. Repeat every 2 years.  Lung Cancer Screening: (Low Dose CT Chest recommended if Age 39-80 years, 20 pack-year currently smoking OR have quit w/in 15years.) does not qualify.   Lung Cancer Screening Referral: N/A   Additional Screening:  Hepatitis C Screening: does not qualify; Completed N/A   Vision Screening: Recommended annual ophthalmology exams for early detection of glaucoma and other disorders of the eye. Is the patient up to date with their annual eye exam?  Yes  Who is the provider or what is the name of the office in which the patient attends annual eye exams? Dr. Burgess Estelle  If pt is not established with a provider, would they like to be referred to a provider to establish care? No .   Dental Screening: Recommended annual dental exams for proper oral hygiene  Diabetic Foot Exam: Diabetic Foot Exam: Completed N/A   Community Resource Referral / Chronic Care Management: CRR required this visit?  No   CCM required this visit?  No     Plan:     I have personally reviewed and noted the following in the patient's chart:   Medical and social history Use of alcohol, tobacco or illicit drugs  Current medications and supplements including opioid prescriptions.  Patient is not currently taking opioid prescriptions. Functional ability and status Nutritional status Physical activity Advanced directives List of other physicians  Hospitalizations, surgeries, and ER visits in previous 12 months Vitals Screenings to include cognitive, depression, and falls Referrals and appointments  In addition, I have reviewed and discussed with patient certain preventive protocols, quality metrics, and best practice recommendations. A written personalized care plan for preventive services as well as general preventive health recommendations were provided to patient.     Estil Heman, NP   09/06/2023   After Visit Summary: (In Person-Printed) AVS printed and given to the patient  Nurse Notes: Due for COVID-19 vaccine

## 2023-09-07 ENCOUNTER — Encounter: Payer: Medicare PPO | Admitting: Family

## 2023-09-07 ENCOUNTER — Other Ambulatory Visit: Payer: Self-pay | Admitting: Cardiovascular Disease

## 2023-09-07 DIAGNOSIS — I4819 Other persistent atrial fibrillation: Secondary | ICD-10-CM

## 2023-09-07 NOTE — Telephone Encounter (Signed)
 Prescription refill request for Eliquis  received. Indication: a fib Last office visit: 08/29/23 Scr: 1.4 epic 08/29/23 Age: 83 Weight: 60kg

## 2023-09-14 DIAGNOSIS — I4819 Other persistent atrial fibrillation: Secondary | ICD-10-CM | POA: Diagnosis not present

## 2023-09-17 DIAGNOSIS — I4819 Other persistent atrial fibrillation: Secondary | ICD-10-CM

## 2023-10-02 ENCOUNTER — Ambulatory Visit: Payer: Self-pay | Admitting: Cardiovascular Disease

## 2023-10-02 ENCOUNTER — Ambulatory Visit (HOSPITAL_COMMUNITY)
Admission: RE | Admit: 2023-10-02 | Discharge: 2023-10-02 | Disposition: A | Discharge status: Home / Self Care | Source: Ambulatory Visit | Attending: Cardiovascular Disease | Admitting: Cardiovascular Disease

## 2023-10-02 DIAGNOSIS — R0602 Shortness of breath: Secondary | ICD-10-CM | POA: Insufficient documentation

## 2023-10-02 DIAGNOSIS — I509 Heart failure, unspecified: Secondary | ICD-10-CM

## 2023-10-02 DIAGNOSIS — I4819 Other persistent atrial fibrillation: Secondary | ICD-10-CM

## 2023-10-02 LAB — ECHOCARDIOGRAM COMPLETE
AR max vel: 2.16 cm2
AV Area VTI: 1.85 cm2
AV Area mean vel: 1.77 cm2
AV Mean grad: 3 mmHg
AV Peak grad: 4.4 mmHg
Ao pk vel: 1.05 m/s
Area-P 1/2: 4.68 cm2
S' Lateral: 2.64 cm

## 2023-10-03 NOTE — Telephone Encounter (Signed)
 Croitoru, Mihai, MD  10/02/23  3:08 PM Result Note Heart pumping strength remains normal. There is no obvious evidence of CAD and no clear cardiac reason for the shortness of breath. Would like to check a couple of other things: - pulmonary function tests (lung damage from radiation or the hiatal hernia may be causing a restrictive defect in her ventilation) - cardiac PYP scan for (possible) cardiac amyloidosis  Called the patient and went over the information above. Also- A PYP scan, also known as Technetium Pyrophosphate Scintigraphy, is a nuclear imaging test used to diagnose cardiac amyloidosis, a condition where abnormal proteins (amyloids) build up in the heart. This non-invasive procedure helps determine the type and severity of cardiac amyloidosis, guiding treatment decisions. The scan involves injecting a small amount of a radioactive tracer (Tc-PYP) into the blood and then taking images of the heart after a wait period.    She is aware that someone will call her to schedule the PFTs and PYP scan.   She asked me to mail the results to her. Will mail to the patient. Address confirmed. Sent a text- with information about signing up for MyChart.

## 2023-10-08 NOTE — Progress Notes (Signed)
 Cardiology Office Note    Date:  10/12/2023  ID:  Keyonna, [place] [birth year], MRN [id number] PCP:  Estil Heman, NP  Cardiologist:  Luana Rumple, MD  Electrophysiologist:  None   Chief Complaint: Follow up for shortness of breath.   History of Present Illness: Aaron Aas    LAURIAN EDRINGTON is a 83 y.o. female with visit-pertinent history of persistent atrial fibrillation on Eliquis , hypertension, hyperlipidemia and hypothyroidism, iron  deficiency anemia, GERD, headaches and anxiety/depression.  Patient initially diagnosed with atrial fibrillation in 2011, underwent a DCCV in 2017.  Myoview  in 06/2015 showed no evidence of ischemia or infarction.  Echo in 09/2019 showed LVEF of 55 to 60% with normal wall motion, normal LV size and function, biatrial enlargement (right >left), and mild to moderate MR.  She wore 2 weeks ZIO monitor in 10/2021 which showed 100% atrial fibrillation burden and isolated PVCs.  Patient was seen in clinic in 02/2023 at which time she reported chest discomfort and dyspnea with exertion, relieved with rest.  Lexiscan  Myoview  and echo were ordered for further evaluation.  Myoview  was low risk with no evidence of ischemia, echocardiogram showed LVEF of 55 to 6% with normal wall motion, normal LV size and function, severe biatrial enlargement, mild MR and moderate TR.  Patient was seen for follow-up on 05/31/2023, it was noted that she was a difficult historian and had trouble elaborating on her symptoms.  She denied any chest pain and reported that her breathing was usually okay, although noted she had some good days and bad days.  At that time patient reported 3 falls in the prior 6 months.  Patient was unclear as to reasoning as to why she fell, most sounded as though they were mechanical falls.  Patient was seen in follow-up by Dr. Alvis Ba on 08/29/2023, patient reported a gradual and steady worsening of her exercise tolerance.  Patient reported getting tired very easily and becoming  short of breath.  She reported that she tried to push herself to complete a test she would develop symptoms of near syncope.  It was noted that she skipped a dose of furosemide  she developed severe edema, but as long as she took it she had minimal swelling.  It was noted that patient describes symptoms of decompensated heart failure though she did not appear to be grossly hypervolemic.  It was noted that fatigue and weakness seem to be more prominent than shortness of breath, cardiac monitor was recommended to ensure she did not have excessive bradycardia from her metoprolol  or poor ventricular rate control.  Echocardiogram was also ordered.  Cardiac monitor worn for 5 days and 13 hours indicated that patient was in atrial fibrillation with largely well-controlled ventricular rates, average rate was 76 bpm.  There were no episodes of severe bradycardia and no significant pauses.  There were occasional however brief episodes of rapid ventricular response.  Echocardiogram on 10/02/2023 indicated LVEF of 55 to 60%, LV with normal function, no RWMA, diastolic parameters could not be evaluated, RV systolic function was low, RV size was normal, there was normal pulmonary artery pressures, biatrial moderately enlarged, mild mitral valve regurgitation, mild to moderate tricuspid valve regurgitation.  Today she presents for follow up. She is a very pleasant lady that presents today with her son.  She continues to note shortness of breath and exercise intolerance.  Patient reports that she can walk around the grocery store become fatigued and has a vague strange sensation in her chest, she reports  is not chest pain, she feels as though she is unable to take a deep breath.  She notes she can sit in her car and gradually her breathing rate will decrease and she will slowly feel better.  Patient denies any chest pain with exertion or at rest, notes she has a history of acid reflux.  On review of patient's prior CT imaging it  was noted in January 2024 that she had a moderate to large hiatal hernia.  She denies any palpitations, notes that she will occasionally feel lightheaded with ongoing fatigue.  She denies any syncope.  Reviewed patient's echocardiogram and monitor results with the patient and her son as well as recommendation she undergo PFTs and PYP scan.  Patient in agreement and all questions were answered.  Her son notes that she did have exposure to secondhand smoke as his father smoked multiple packs of cigarettes in a day, however patient reports that she never smoked herself.  ROS: .   Today she denies chest pain, palpitations, melena, hematuria, hemoptysis, diaphoresis, weakness, presyncope, syncope, orthopnea, and PND.  All other systems are reviewed and otherwise negative. Studies Reviewed: Aaron Aas   EKG:  EKG is not ordered today.  CV Studies: Cardiac studies reviewed are outlined and summarized above. Otherwise please see EMR for full report. Cardiac Studies & Procedures   ______________________________________________________________________________________________   STRESS TESTS  MYOCARDIAL PERFUSION IMAGING 03/29/2023  Narrative   The study is normal. The study is low risk.   No ST deviation was noted.   LV perfusion is normal. There is no evidence of ischemia. There is no evidence of infarction.   Left ventricular function is normal. Nuclear stress EF: 70%. The left ventricular ejection fraction is hyperdynamic (>65%). End diastolic cavity size is normal. End systolic cavity size is normal.   ECHOCARDIOGRAM  ECHOCARDIOGRAM COMPLETE 10/02/2023  Narrative ECHOCARDIOGRAM REPORT    Patient Name:   CAITLINN KLINKER     Date of Exam: 10/02/2023 Medical Rec #:  [id number]       Height:       64.0 in Accession #:    [id number]      Weight:       132.8 lb Date of Birth:  July 25, 1940       BSA:          1.644 m Patient Age:    82 years        BP:           108/70 mmHg Patient Gender: F               HR:            70 bpm. Exam Location:  [address]  Procedure: 2D Echo, 3D Echo, Cardiac Doppler and Color Doppler (Both Spectral and Color Flow Doppler were utilized during procedure).  Indications:    R07.2 Precordial pain; R06.02 SOB; R60.0 Lower extremity edema; I48.2 Chronic atrial fibrillation  History:        Patient has prior history of Echocardiogram examinations, most recent 04/18/2023. CHF, Arrythmias:Atrial Fibrillation, Signs/Symptoms:Chest Pain, Shortness of Breath and Edema; Risk Factors:Hypertension, CKD stage 3, Dyslipidemia and Non-Smoker. Patient complains of new chest pain, increasing SOB and mild leg edema. History of bilateral breast cancer with mastectomies.  Sonographer:    Richarda Chance RVT, RDCS (AE), RDMS Referring Phys: [phone] [facility]   Sonographer Comments: Image acquisition challenging due to mastectomy. Global longitudinal strain was attempted. IMPRESSIONS   1. Left ventricular ejection fraction, by estimation,  is 55 to 60%. Left ventricular ejection fraction by 3D volume is 50 %. The left ventricle has normal function. The left ventricle has no regional wall motion abnormalities. Left ventricular diastolic function could not be evaluated. 2. Right ventricular systolic function is low normal. The right ventricular size is normal. There is normal pulmonary artery systolic pressure. The estimated right ventricular systolic pressure is 27.0 mmHg. 3. Left atrial size was moderately dilated. 4. Right atrial size was moderately dilated. 5. The mitral valve is normal in structure. Mild mitral valve regurgitation. 6. Tricuspid valve regurgitation is mild to moderate. 7. The aortic valve is tricuspid. Aortic valve regurgitation is not visualized. No aortic stenosis is present. 8. There is mild (Grade II) protruding plaque involving the ascending aorta.  Comparison(s): EF 55%, moderate TR.  FINDINGS Left Ventricle: Left ventricular ejection fraction, by  estimation, is 55 to 60%. Left ventricular ejection fraction by 3D volume is 50 %. The left ventricle has normal function. The left ventricle has no regional wall motion abnormalities. The left ventricular internal cavity size was normal in size. There is no left ventricular hypertrophy. Abnormal (paradoxical) septal motion, consistent with left bundle branch block. Left ventricular diastolic function could not be evaluated due to atrial fibrillation. Left ventricular diastolic function could not be evaluated.  Right Ventricle: The right ventricular size is normal. No increase in right ventricular wall thickness. Right ventricular systolic function is low normal. There is normal pulmonary artery systolic pressure. The tricuspid regurgitant velocity is 2.45 m/s, and with an assumed right atrial pressure of 3 mmHg, the estimated right ventricular systolic pressure is 27.0 mmHg.  Left Atrium: Left atrial size was moderately dilated.  Right Atrium: Right atrial size was moderately dilated.  Pericardium: There is no evidence of pericardial effusion.  Mitral Valve: The mitral valve is normal in structure. Mild mitral valve regurgitation, with eccentric posteriorly directed jet.  Tricuspid Valve: The tricuspid valve is normal in structure. Tricuspid valve regurgitation is mild to moderate.  Aortic Valve: The aortic valve is tricuspid. Aortic valve regurgitation is not visualized. No aortic stenosis is present. Aortic valve mean gradient measures 3.0 mmHg. Aortic valve peak gradient measures 4.4 mmHg. Aortic valve area, by VTI measures 1.85 cm.  Pulmonic Valve: The pulmonic valve was normal in structure. Pulmonic valve regurgitation is trivial. No evidence of pulmonic stenosis.  Aorta: The aortic root and ascending aorta are structurally normal, with no evidence of dilitation. There is mild (Grade II) protruding plaque involving the ascending aorta.  IAS/Shunts: No atrial level shunt detected by  color flow Doppler.  Additional Comments: 3D was performed not requiring image post processing on an independent workstation and was abnormal.   LEFT VENTRICLE PLAX 2D LVIDd:         3.66 cm         Diastology LVIDs:         2.64 cm         LV e' medial:    5.12 cm/s LV PW:         0.74 cm         LV E/e' medial:  25.2 LV IVS:        0.95 cm         LV e' lateral:   6.30 cm/s LVOT diam:     1.96 cm         LV E/e' lateral: 20.5 LV SV:         45 LV SV Index:   27  LVOT Area:     3.02 cm        3D Volume EF LV 3D EF:    Left ventricul ar ejection fraction by 3D volume is 50 %.  3D Volume EF: 3D EF:        50 % LV EDV:       74 ml LV ESV:       37 ml LV SV:        37 ml  RIGHT VENTRICLE RV S prime:     10.80 cm/s TAPSE (M-mode): 1.5 cm  LEFT ATRIUM           Index        RIGHT ATRIUM           Index LA diam:      3.67 cm 2.23 cm/m   RA Area:     22.70 cm LA Vol (A2C): 67.4 ml 41.00 ml/m  RA Volume:   70.55 ml  42.92 ml/m AORTIC VALVE                    PULMONIC VALVE AV Area (Vmax):    2.16 cm     PV Vmax:       0.76 m/s AV Area (Vmean):   1.77 cm     PV Peak grad:  2.3 mmHg AV Area (VTI):     1.85 cm AV Vmax:           105.00 cm/s AV Vmean:          79.600 cm/s AV VTI:            0.241 m AV Peak Grad:      4.4 mmHg AV Mean Grad:      3.0 mmHg LVOT Vmax:         75.00 cm/s LVOT Vmean:        46.600 cm/s LVOT VTI:          0.148 m LVOT/AV VTI ratio: 0.61  AORTA Ao Root diam: 2.95 cm Ao Asc diam:  3.10 cm Ao Arch diam: 2.8 cm  MITRAL VALVE                TRICUSPID VALVE MV Area (PHT): 4.68 cm     TR Peak grad:   24.0 mmHg MV Decel Time: 162 msec     TR Vmax:        245.00 cm/s MV E velocity: 129.00 cm/s SHUNTS Systemic VTI:  0.15 m Systemic Diam: 1.96 cm  Karyl Paget Croitoru MD Electronically signed by Luana Rumple MD Signature Date/Time: 10/02/2023/9:29:42 AM    Final    MONITORS  LONG TERM MONITOR (3-14 DAYS) 09/14/2023  Narrative   The  dominant rhythm is atrial fibrillation with largely well-controlled ventricular rates.  The average ventricular rate is 76 bpm.   There are no episodes of severe bradycardia (minimum heart rate is 50 bpm) and there are no significant pauses.   Episodes of rapid ventricular response are seen occasionally, but are brief.   There are rare premature ventricular beats.   Several symptom-triggered recordings are submitted for analysis.  These are generally associated with periods of mild rapid ventricular response, but the association is inconsistent.  Abnormal arrhythmia monitor due to the presence of persistent atrial fibrillation, but with largely appropriate rate control. There is no consistent association between ventricular rate and symptoms.  Patch Wear Time:  5 days and 13 hours (2025-04-17T14:26:01-399 to 2025-04-23T03:50:22-0400)  Atrial Fibrillation occurred continuously (100% burden),  ranging from 50-148 bpm (avg of 76 bpm). Isolated VEs were rare (<1.0%, 309), VE Couplets were rare (<1.0%, 25), and VE Triplets were rare (<1.0%, 1).       ______________________________________________________________________________________________       Current Reported Medications:.    Current Meds  Medication Sig   albuterol  (VENTOLIN  HFA) 108 (90 Base) MCG/ACT inhaler Inhale 1 puff into the lungs every 6 (six) hours as needed for shortness of breath.   ALPRAZolam  (XANAX ) 0.5 MG tablet TAKE ONE TABLET BY MOUTH DAILY FOR ANXIETY   calcium  citrate (CALCITRATE - DOSED IN MG ELEMENTAL CALCIUM ) 950 (200 Ca) MG tablet Take 200 mg of elemental calcium  by mouth daily.   Cholecalciferol  (VITAMIN D3) 2000 UNITS TABS Take 2,000 Units by mouth daily.   docusate sodium  (COLACE) 100 MG capsule Take 1 capsule (100 mg total) by mouth daily.   ELIQUIS  5 MG TABS tablet TAKE 1 TABLET BY MOUTH TWICE DAILY   furosemide  (LASIX ) 40 MG tablet TAKE 1 TABLET BY MOUTH DAILY   gabapentin  (NEURONTIN ) 100 MG capsule Take 1  capsule (100 mg total) by mouth 2 (two) times daily.   gabapentin  (NEURONTIN ) 100 MG capsule TAKE 1 CAPSULE BY MOUTH AT BEDTIME   ibuprofen (ADVIL) 200 MG tablet Take 200 mg by mouth daily as needed for headache or mild pain.   Iron , Ferrous Sulfate , 325 (65 Fe) MG TABS Take 325 mg by mouth daily.   levothyroxine  (SYNTHROID ) 137 MCG tablet TAKE 1 TABLET BY MOUTH DAILY   meclizine  (ANTIVERT ) 12.5 MG tablet Take 12.5 mg by mouth as needed for dizziness.   metoprolol  tartrate (LOPRESSOR ) 25 MG tablet Take 1 tablet (25 mg total) by mouth 2 (two) times daily.   potassium chloride  SA (KLOR-CON  M) 20 MEQ tablet TAKE 2 TABLETS BY MOUTH DAILY   rosuvastatin  (CRESTOR ) 10 MG tablet TAKE 1 TABLET BY MOUTH DAILY   sertraline  (ZOLOFT ) 25 MG tablet TAKE 2 TABLETS BY MOUTH DAILY   tamoxifen  (NOLVADEX ) 20 MG tablet TAKE 1 TABLET BY MOUTH DAILY    Physical Exam:    VS:  BP 106/70 (BP Location: Left Arm, Patient Position: Sitting, Cuff Size: Normal)   Pulse 75   Ht 5\' 4"  (1.626 m)   Wt 122 lb 6.4 oz (55.5 kg)   SpO2 96%   BMI 21.01 kg/m    Wt Readings from Last 3 Encounters:  10/12/23 122 lb 6.4 oz (55.5 kg)  09/06/23 132 lb 12.8 oz (60.2 kg)  08/29/23 132 lb 12.8 oz (60.2 kg)    GEN: Well nourished, well developed in no acute distress NECK: No JVD; No carotid bruits CARDIAC: Irregular RR, no murmurs, rubs, gallops RESPIRATORY:  Clear to auscultation without rales, wheezing or rhonchi  ABDOMEN: Soft, non-tender, non-distended EXTREMITIES:  No edema; No acute deformity     Asessement and Plan:.    Fatigue/DOE: At last office visit patient has noted exertional dyspnea and fatigue, describing symptoms of decompensated heart failure without being grossly volume overloaded.  Patient had MPI in 2024 that was overall low risk, echocardiogram and cardiac monitor overall reassuring as noted above.  Dr. Alvis Ba has recommended she undergo PYP scan as well as pulmonary function tests.  Patient with history of  hiatal hernia which may be causing restrictive defect in her ventilation. Today she reports ongoing fatigue and dyspnea with exertion.  Patient's echocardiogram results reviewed, all questions were answered.  Patient denies any recent changes.  She denies chest pain or syncope. She appears euvolemic and  well compensated on exam. Patient to undergo PFTs and PYP scan, she notes that neither have yet been scheduled but she is agreeable to testing.  Persistent atrial fibrillation: Cardiac monitor worn for 5 days and 13 hours indicated that patient was in atrial fibrillation with largely well-controlled ventricular rates, average rate was 76 bpm.  There were no episodes of severe bradycardia and no significant pauses.  There were occasional however brief episodes of rapid ventricular response.  Today she denies any palpitations or feeling of high heart rates, denies any bleeding problems on Eliquis .  Continue Eliquis  5 mg twice daily and metoprolol  tartrate 25 mg twice daily.  Mitral regurgitation/tricuspid regurgitation: Echo in 03/2023 showed mild MR and moderate TR.  Hypertension: Blood pressure today 106/70. Continue current antihypertensive regimen.   Hyperlipidemia: Lipid profile in 10/2022 indicated total cholesterol 142, triglycerides 148, HDL 47 and LDL 72.  Continue Crestor  10 mg daily.   Disposition: PYP scan and PFTs pending. F/u with Dr. Alvis Ba as scheduled.   Signed, Nattalie Santiesteban D Ishan Sanroman, NP

## 2023-10-12 ENCOUNTER — Ambulatory Visit: Attending: Cardiology | Admitting: Cardiology

## 2023-10-12 ENCOUNTER — Encounter: Payer: Self-pay | Admitting: Cardiology

## 2023-10-12 VITALS — BP 106/70 | HR 75 | Ht 64.0 in | Wt 122.4 lb

## 2023-10-12 DIAGNOSIS — I340 Nonrheumatic mitral (valve) insufficiency: Secondary | ICD-10-CM

## 2023-10-12 DIAGNOSIS — I071 Rheumatic tricuspid insufficiency: Secondary | ICD-10-CM

## 2023-10-12 DIAGNOSIS — I10 Essential (primary) hypertension: Secondary | ICD-10-CM | POA: Diagnosis not present

## 2023-10-12 DIAGNOSIS — I4819 Other persistent atrial fibrillation: Secondary | ICD-10-CM

## 2023-10-12 DIAGNOSIS — E7800 Pure hypercholesterolemia, unspecified: Secondary | ICD-10-CM | POA: Diagnosis not present

## 2023-10-12 DIAGNOSIS — R0602 Shortness of breath: Secondary | ICD-10-CM

## 2023-10-12 NOTE — Patient Instructions (Signed)
 Medication Instructions:  No changes *If you need a refill on your cardiac medications before your next appointment, please call your pharmacy*  Lab Work: No labs  Testing/Procedures: No testing  Follow-Up: At [facility], you and your health needs are our priority.  As part of our continuing mission to provide you with exceptional heart care, our providers are all part of one team.  This team includes your primary Cardiologist (physician) and Advanced Practice Providers or APPs (Physician Assistants and Nurse Practitioners) who all work together to provide you with the care you need, when you need it.  Your next appointment:   Keep appointment  We recommend signing up for the patient portal called "MyChart".  Sign up information is provided on this After Visit Summary.  MyChart is used to connect with patients for Virtual Visits (Telemedicine).  Patients are able to view lab/test results, encounter notes, upcoming appointments, etc.  Non-urgent messages can be sent to your provider as well.   To learn more about what you can do with MyChart, go to [email or web address].

## 2023-10-16 ENCOUNTER — Emergency Department (HOSPITAL_COMMUNITY)

## 2023-10-16 ENCOUNTER — Other Ambulatory Visit: Payer: Self-pay

## 2023-10-16 ENCOUNTER — Observation Stay (HOSPITAL_COMMUNITY)
Admission: EM | Admit: 2023-10-16 | Discharge: 2023-10-19 | Disposition: A | Discharge status: Home / Self Care | Attending: Internal Medicine | Admitting: Internal Medicine

## 2023-10-16 ENCOUNTER — Ambulatory Visit: Payer: Self-pay

## 2023-10-16 ENCOUNTER — Encounter (HOSPITAL_COMMUNITY): Payer: Self-pay | Admitting: Internal Medicine

## 2023-10-16 DIAGNOSIS — F411 Generalized anxiety disorder: Secondary | ICD-10-CM | POA: Diagnosis not present

## 2023-10-16 DIAGNOSIS — I4819 Other persistent atrial fibrillation: Secondary | ICD-10-CM | POA: Insufficient documentation

## 2023-10-16 DIAGNOSIS — Z79899 Other long term (current) drug therapy: Secondary | ICD-10-CM | POA: Insufficient documentation

## 2023-10-16 DIAGNOSIS — R1030 Lower abdominal pain, unspecified: Secondary | ICD-10-CM | POA: Diagnosis present

## 2023-10-16 DIAGNOSIS — C50411 Malignant neoplasm of upper-outer quadrant of right female breast: Secondary | ICD-10-CM | POA: Insufficient documentation

## 2023-10-16 DIAGNOSIS — I10 Essential (primary) hypertension: Secondary | ICD-10-CM | POA: Diagnosis present

## 2023-10-16 DIAGNOSIS — N1831 Chronic kidney disease, stage 3a: Secondary | ICD-10-CM | POA: Insufficient documentation

## 2023-10-16 DIAGNOSIS — E876 Hypokalemia: Secondary | ICD-10-CM | POA: Diagnosis not present

## 2023-10-16 DIAGNOSIS — Z7901 Long term (current) use of anticoagulants: Secondary | ICD-10-CM | POA: Diagnosis not present

## 2023-10-16 DIAGNOSIS — I130 Hypertensive heart and chronic kidney disease with heart failure and stage 1 through stage 4 chronic kidney disease, or unspecified chronic kidney disease: Secondary | ICD-10-CM | POA: Insufficient documentation

## 2023-10-16 DIAGNOSIS — K5732 Diverticulitis of large intestine without perforation or abscess without bleeding: Secondary | ICD-10-CM | POA: Diagnosis not present

## 2023-10-16 DIAGNOSIS — Z96642 Presence of left artificial hip joint: Secondary | ICD-10-CM | POA: Diagnosis not present

## 2023-10-16 DIAGNOSIS — I5032 Chronic diastolic (congestive) heart failure: Secondary | ICD-10-CM | POA: Diagnosis not present

## 2023-10-16 DIAGNOSIS — K449 Diaphragmatic hernia without obstruction or gangrene: Secondary | ICD-10-CM | POA: Diagnosis not present

## 2023-10-16 DIAGNOSIS — E785 Hyperlipidemia, unspecified: Secondary | ICD-10-CM | POA: Insufficient documentation

## 2023-10-16 DIAGNOSIS — E039 Hypothyroidism, unspecified: Secondary | ICD-10-CM | POA: Diagnosis not present

## 2023-10-16 DIAGNOSIS — Z85828 Personal history of other malignant neoplasm of skin: Secondary | ICD-10-CM | POA: Insufficient documentation

## 2023-10-16 DIAGNOSIS — E7800 Pure hypercholesterolemia, unspecified: Secondary | ICD-10-CM | POA: Insufficient documentation

## 2023-10-16 DIAGNOSIS — R0989 Other specified symptoms and signs involving the circulatory and respiratory systems: Secondary | ICD-10-CM | POA: Diagnosis not present

## 2023-10-16 DIAGNOSIS — F3341 Major depressive disorder, recurrent, in partial remission: Secondary | ICD-10-CM | POA: Diagnosis not present

## 2023-10-16 DIAGNOSIS — F418 Other specified anxiety disorders: Secondary | ICD-10-CM | POA: Diagnosis not present

## 2023-10-16 DIAGNOSIS — A419 Sepsis, unspecified organism: Secondary | ICD-10-CM | POA: Diagnosis not present

## 2023-10-16 DIAGNOSIS — N183 Chronic kidney disease, stage 3 unspecified: Secondary | ICD-10-CM | POA: Diagnosis present

## 2023-10-16 DIAGNOSIS — K5792 Diverticulitis of intestine, part unspecified, without perforation or abscess without bleeding: Principal | ICD-10-CM | POA: Insufficient documentation

## 2023-10-16 DIAGNOSIS — Z170 Estrogen receptor positive status [ER+]: Secondary | ICD-10-CM | POA: Insufficient documentation

## 2023-10-16 DIAGNOSIS — I509 Heart failure, unspecified: Secondary | ICD-10-CM | POA: Diagnosis not present

## 2023-10-16 DIAGNOSIS — K429 Umbilical hernia without obstruction or gangrene: Secondary | ICD-10-CM | POA: Diagnosis not present

## 2023-10-16 DIAGNOSIS — I700 Atherosclerosis of aorta: Secondary | ICD-10-CM | POA: Diagnosis not present

## 2023-10-16 DIAGNOSIS — I517 Cardiomegaly: Secondary | ICD-10-CM | POA: Diagnosis not present

## 2023-10-16 LAB — URINALYSIS, W/ REFLEX TO CULTURE (INFECTION SUSPECTED)
Bilirubin Urine: NEGATIVE
Glucose, UA: NEGATIVE mg/dL
Hgb urine dipstick: NEGATIVE
Ketones, ur: 5 mg/dL — AB
Nitrite: NEGATIVE
Protein, ur: NEGATIVE mg/dL
Specific Gravity, Urine: 1.045 — ABNORMAL HIGH (ref 1.005–1.030)
pH: 5 (ref 5.0–8.0)

## 2023-10-16 LAB — CBC WITH DIFFERENTIAL/PLATELET
Abs Immature Granulocytes: 0.11 10*3/uL — ABNORMAL HIGH (ref 0.00–0.07)
Basophils Absolute: 0.1 10*3/uL (ref 0.0–0.1)
Basophils Relative: 1 %
Eosinophils Absolute: 0.1 10*3/uL (ref 0.0–0.5)
Eosinophils Relative: 1 %
HCT: 45 % (ref 36.0–46.0)
Hemoglobin: 14 g/dL (ref 12.0–15.0)
Immature Granulocytes: 1 %
Lymphocytes Relative: 13 %
Lymphs Abs: 2.2 10*3/uL (ref 0.7–4.0)
MCH: 30.4 pg (ref 26.0–34.0)
MCHC: 31.1 g/dL (ref 30.0–36.0)
MCV: 97.8 fL (ref 80.0–100.0)
Monocytes Absolute: 1.3 10*3/uL — ABNORMAL HIGH (ref 0.1–1.0)
Monocytes Relative: 8 %
Neutro Abs: 12.9 10*3/uL — ABNORMAL HIGH (ref 1.7–7.7)
Neutrophils Relative %: 76 %
Platelets: 103 10*3/uL — ABNORMAL LOW (ref 150–400)
RBC: 4.6 MIL/uL (ref 3.87–5.11)
RDW: 13.1 % (ref 11.5–15.5)
WBC: 16.7 10*3/uL — ABNORMAL HIGH (ref 4.0–10.5)
nRBC: 0 % (ref 0.0–0.2)

## 2023-10-16 LAB — I-STAT CHEM 8, ED
BUN: 19 mg/dL (ref 8–23)
Calcium, Ion: 1.07 mmol/L — ABNORMAL LOW (ref 1.15–1.40)
Chloride: 100 mmol/L (ref 98–111)
Creatinine, Ser: 1.5 mg/dL — ABNORMAL HIGH (ref 0.44–1.00)
Glucose, Bld: 94 mg/dL (ref 70–99)
HCT: 43 % (ref 36.0–46.0)
Hemoglobin: 14.6 g/dL (ref 12.0–15.0)
Potassium: 2.7 mmol/L — CL (ref 3.5–5.1)
Sodium: 140 mmol/L (ref 135–145)
TCO2: 26 mmol/L (ref 22–32)

## 2023-10-16 LAB — BASIC METABOLIC PANEL WITH GFR
Anion gap: 7 (ref 5–15)
BUN: 15 mg/dL (ref 8–23)
CO2: 25 mmol/L (ref 22–32)
Calcium: 8.1 mg/dL — ABNORMAL LOW (ref 8.9–10.3)
Chloride: 105 mmol/L (ref 98–111)
Creatinine, Ser: 1.32 mg/dL — ABNORMAL HIGH (ref 0.44–1.00)
GFR, Estimated: 40 mL/min — ABNORMAL LOW (ref >60)
Glucose, Bld: 79 mg/dL (ref 70–99)
Potassium: 3.8 mmol/L (ref 3.5–5.1)
Sodium: 137 mmol/L (ref 135–145)

## 2023-10-16 LAB — I-STAT CG4 LACTIC ACID, ED
Lactic Acid, Venous: 2.4 mmol/L (ref 0.5–1.9)
Lactic Acid, Venous: 1.3 mmol/L (ref 0.5–1.9)

## 2023-10-16 LAB — PROTIME-INR
INR: 1.6 — ABNORMAL HIGH (ref 0.8–1.2)
Prothrombin Time: 19.1 s — ABNORMAL HIGH (ref 11.4–15.2)

## 2023-10-16 LAB — COMPREHENSIVE METABOLIC PANEL WITH GFR
ALT: 8 U/L (ref 0–44)
AST: 17 U/L (ref 15–41)
Albumin: 3.1 g/dL — ABNORMAL LOW (ref 3.5–5.0)
Alkaline Phosphatase: 48 U/L (ref 38–126)
Anion gap: 12 (ref 5–15)
BUN: 16 mg/dL (ref 8–23)
CO2: 25 mmol/L (ref 22–32)
Calcium: 8.5 mg/dL — ABNORMAL LOW (ref 8.9–10.3)
Chloride: 103 mmol/L (ref 98–111)
Creatinine, Ser: 1.52 mg/dL — ABNORMAL HIGH (ref 0.44–1.00)
GFR, Estimated: 34 mL/min — ABNORMAL LOW (ref >60)
Glucose, Bld: 94 mg/dL (ref 70–99)
Potassium: 2.8 mmol/L — ABNORMAL LOW (ref 3.5–5.1)
Sodium: 140 mmol/L (ref 135–145)
Total Bilirubin: 1.8 mg/dL — ABNORMAL HIGH (ref 0.0–1.2)
Total Protein: 6.3 g/dL — ABNORMAL LOW (ref 6.5–8.1)

## 2023-10-16 LAB — MAGNESIUM: Magnesium: 1.8 mg/dL (ref 1.7–2.4)

## 2023-10-16 MED ORDER — POTASSIUM CHLORIDE 10 MEQ/100ML IV SOLN
10.0000 meq | INTRAVENOUS | Status: AC
  Administered 2023-10-16 (×4): 10 meq via INTRAVENOUS
  Filled 2023-10-16 (×4): qty 100

## 2023-10-16 MED ORDER — POTASSIUM CHLORIDE 10 MEQ/100ML IV SOLN
10.0000 meq | INTRAVENOUS | Status: AC
  Administered 2023-10-16 (×2): 10 meq via INTRAVENOUS
  Filled 2023-10-16 (×2): qty 100

## 2023-10-16 MED ORDER — GABAPENTIN 100 MG PO CAPS
100.0000 mg | ORAL_CAPSULE | Freq: Every day | ORAL | Status: DC
  Administered 2023-10-17 – 2023-10-19 (×3): 100 mg via ORAL
  Filled 2023-10-16 (×3): qty 1

## 2023-10-16 MED ORDER — SODIUM CHLORIDE 0.9 % IV SOLN
1.0000 g | Freq: Every day | INTRAVENOUS | Status: DC
  Administered 2023-10-17: 1 g via INTRAVENOUS
  Filled 2023-10-16: qty 10

## 2023-10-16 MED ORDER — POLYETHYLENE GLYCOL 3350 17 G PO PACK: 17.0000 g | PACK | Freq: Every day | ORAL | Status: DC | PRN

## 2023-10-16 MED ORDER — TAMOXIFEN CITRATE 10 MG PO TABS
20.0000 mg | ORAL_TABLET | Freq: Every day | ORAL | Status: DC
  Administered 2023-10-17 – 2023-10-19 (×3): 20 mg via ORAL
  Filled 2023-10-16 (×3): qty 2

## 2023-10-16 MED ORDER — METRONIDAZOLE 500 MG/100ML IV SOLN
500.0000 mg | Freq: Two times a day (BID) | INTRAVENOUS | Status: DC
  Administered 2023-10-16 – 2023-10-17 (×2): 500 mg via INTRAVENOUS
  Filled 2023-10-16 (×2): qty 100

## 2023-10-16 MED ORDER — HYDROCODONE-ACETAMINOPHEN 5-325 MG PO TABS: 1.0000 | ORAL_TABLET | Freq: Four times a day (QID) | ORAL | Status: DC | PRN

## 2023-10-16 MED ORDER — HYDROMORPHONE HCL 1 MG/ML IJ SOLN: 0.5000 mg | INTRAMUSCULAR | Status: DC | PRN

## 2023-10-16 MED ORDER — APIXABAN 5 MG PO TABS
5.0000 mg | ORAL_TABLET | Freq: Two times a day (BID) | ORAL | Status: DC
  Administered 2023-10-16 – 2023-10-19 (×6): 5 mg via ORAL
  Filled 2023-10-16 (×6): qty 1

## 2023-10-16 MED ORDER — ROSUVASTATIN CALCIUM 5 MG PO TABS
10.0000 mg | ORAL_TABLET | Freq: Every day | ORAL | Status: DC
  Administered 2023-10-17 – 2023-10-19 (×3): 10 mg via ORAL
  Filled 2023-10-16 (×3): qty 2

## 2023-10-16 MED ORDER — IOHEXOL 350 MG/ML SOLN
60.0000 mL | Freq: Once | INTRAVENOUS | Status: AC | PRN
Start: 2023-10-16 — End: 2023-10-16
  Administered 2023-10-16: 60 mL via INTRAVENOUS

## 2023-10-16 MED ORDER — ALPRAZOLAM 0.5 MG PO TABS: 0.5000 mg | ORAL_TABLET | Freq: Every day | ORAL | Status: DC | PRN

## 2023-10-16 MED ORDER — LACTATED RINGERS IV SOLN: INTRAVENOUS | Status: AC

## 2023-10-16 MED ORDER — SODIUM CHLORIDE 0.9% FLUSH
3.0000 mL | Freq: Two times a day (BID) | INTRAVENOUS | Status: DC
  Administered 2023-10-16 – 2023-10-19 (×6): 3 mL via INTRAVENOUS

## 2023-10-16 MED ORDER — ACETAMINOPHEN 325 MG PO TABS
650.0000 mg | ORAL_TABLET | Freq: Four times a day (QID) | ORAL | Status: DC | PRN
  Administered 2023-10-18: 650 mg via ORAL
  Filled 2023-10-16: qty 2

## 2023-10-16 MED ORDER — SODIUM CHLORIDE 0.9 % IV SOLN
2.0000 g | Freq: Once | INTRAVENOUS | Status: AC
  Administered 2023-10-16: 2 g via INTRAVENOUS
  Filled 2023-10-16: qty 12.5

## 2023-10-16 MED ORDER — SODIUM CHLORIDE 0.9 % IV BOLUS
500.0000 mL | Freq: Once | INTRAVENOUS | Status: AC
  Administered 2023-10-16: 500 mL via INTRAVENOUS

## 2023-10-16 MED ORDER — LEVOTHYROXINE SODIUM 25 MCG PO TABS
137.0000 ug | ORAL_TABLET | Freq: Every day | ORAL | Status: DC
  Administered 2023-10-17 – 2023-10-19 (×3): 137 ug via ORAL
  Filled 2023-10-16 (×3): qty 1

## 2023-10-16 MED ORDER — SERTRALINE HCL 50 MG PO TABS
50.0000 mg | ORAL_TABLET | Freq: Every day | ORAL | Status: DC
  Administered 2023-10-17 – 2023-10-19 (×3): 50 mg via ORAL
  Filled 2023-10-16 (×3): qty 1

## 2023-10-16 MED ORDER — ACETAMINOPHEN 650 MG RE SUPP: 650.0000 mg | Freq: Four times a day (QID) | RECTAL | Status: DC | PRN

## 2023-10-16 MED ORDER — METOPROLOL TARTRATE 25 MG PO TABS
25.0000 mg | ORAL_TABLET | Freq: Two times a day (BID) | ORAL | Status: DC
  Administered 2023-10-16 – 2023-10-19 (×6): 25 mg via ORAL
  Filled 2023-10-16 (×6): qty 1

## 2023-10-16 MED ORDER — METRONIDAZOLE 500 MG/100ML IV SOLN
500.0000 mg | Freq: Once | INTRAVENOUS | Status: AC
  Administered 2023-10-16: 500 mg via INTRAVENOUS
  Filled 2023-10-16: qty 100

## 2023-10-16 MED ORDER — FUROSEMIDE 20 MG PO TABS: 40.0000 mg | ORAL_TABLET | Freq: Every day | ORAL | Status: DC

## 2023-10-16 MED ORDER — VANCOMYCIN HCL IN DEXTROSE 1-5 GM/200ML-% IV SOLN
1000.0000 mg | Freq: Once | INTRAVENOUS | Status: AC
  Administered 2023-10-16: 1000 mg via INTRAVENOUS
  Filled 2023-10-16: qty 200

## 2023-10-16 NOTE — Telephone Encounter (Signed)
 Noted

## 2023-10-16 NOTE — Telephone Encounter (Signed)
 Message routed to PCP Ngetich, Elijio Guadel[place], NP as FYI

## 2023-10-16 NOTE — Telephone Encounter (Signed)
 Duanne Gess, RN  [facility]5 minutes ago (8:58 AM)    Arlie Lain- dispo to ED

## 2023-10-16 NOTE — ED Triage Notes (Addendum)
 Pt. Stated, I got sick about 2-3 days ago and I had some pain on my RLQ and a fever. I called my son to come over. Son stated, When I got there yesterday she had a fever 101.4 and BP 82/51, I gave her some Ibuprofen during the night my fever broke. Pt. Stated, Im still having some pain in the RLQ and feel bad. I took a nerve pill and an OTC medicine for nausea

## 2023-10-16 NOTE — ED Provider Notes (Signed)
 [facility] Provider Note   CSN: [id number] Arrival date & time: 10/16/23  1034     History  Chief Complaint  Patient presents with   Fever   Abdominal Pain   Nausea    Deborah Randall is a 83 y.o. female.  Patient is a 83 year old female with a history of hypertension, atrial fibrillation on Eliquis , hyperlipidemia, GERD.  She presents here with her family member who reports that she has been really cold since yesterday.  She has had some fevers up to 101.4.  She has been having pain to her lower abdomen.  She denies any cough or cold symptoms.  No urinary symptoms.  No vomiting or diarrhea.       Home Medications Prior to Admission medications   Medication Sig Start Date End Date Taking? Authorizing Provider  acetaminophen  (TYLENOL ) 325 MG tablet Take 2 tablets (650 mg total) by mouth every 4 (four) hours as needed for headache or mild pain. Patient not taking: Reported on 10/12/2023 07/08/15   Kilroy, Luke K, PA-C  albuterol  (VENTOLIN  HFA) 108 (90 Base) MCG/ACT inhaler Inhale 1 puff into the lungs every 6 (six) hours as needed for shortness of breath.    [provider]  ALPRAZolam  (XANAX ) 0.5 MG tablet TAKE ONE TABLET BY MOUTH DAILY FOR ANXIETY 08/20/23   Ngetich, Dinah C, NP  calcium  citrate (CALCITRATE - DOSED IN MG ELEMENTAL CALCIUM ) 950 (200 Ca) MG tablet Take 200 mg of elemental calcium  by mouth daily.    [provider]  Cholecalciferol  (VITAMIN D3) 2000 UNITS TABS Take 2,000 Units by mouth daily.    [provider]  docusate sodium  (COLACE) 100 MG capsule Take 1 capsule (100 mg total) by mouth daily. 10/06/21   Ngetich, Dinah C, NP  ELIQUIS  5 MG TABS tablet TAKE 1 TABLET BY MOUTH TWICE DAILY 09/07/23   Croitoru, Mihai, MD  furosemide  (LASIX ) 40 MG tablet TAKE 1 TABLET BY MOUTH DAILY 08/29/23   Ngetich, Dinah C, NP  gabapentin  (NEURONTIN ) 100 MG capsule Take 1 capsule (100 mg total) by mouth 2 (two) times  daily. 06/23/22   Lockie Rima, MD  gabapentin  (NEURONTIN ) 100 MG capsule TAKE 1 CAPSULE BY MOUTH AT BEDTIME 05/07/23   Ngetich, Dinah C, NP  ibuprofen (ADVIL) 200 MG tablet Take 200 mg by mouth daily as needed for headache or mild pain.    [provider]  Iron , Ferrous Sulfate , 325 (65 Fe) MG TABS Take 325 mg by mouth daily. 10/06/21   Ngetich, Dinah C, NP  levothyroxine  (SYNTHROID ) 137 MCG tablet TAKE 1 TABLET BY MOUTH DAILY 06/28/23   Ngetich, Dinah C, NP  meclizine  (ANTIVERT ) 12.5 MG tablet Take 12.5 mg by mouth as needed for dizziness.    [provider]  metoprolol  tartrate (LOPRESSOR ) 25 MG tablet Take 1 tablet (25 mg total) by mouth 2 (two) times daily. 08/29/23   Croitoru, Mihai, MD  potassium chloride  SA (KLOR-CON  M) 20 MEQ tablet TAKE 2 TABLETS BY MOUTH DAILY 08/20/23   Ngetich, Dinah C, NP  rosuvastatin  (CRESTOR ) 10 MG tablet TAKE 1 TABLET BY MOUTH DAILY 02/28/23   Ngetich, Dinah C, NP  sertraline  (ZOLOFT ) 25 MG tablet TAKE 2 TABLETS BY MOUTH DAILY 06/15/23   Ngetich, Dinah C, NP  tamoxifen  (NOLVADEX ) 20 MG tablet TAKE 1 TABLET BY MOUTH DAILY 08/07/23   Iruku, Praveena, MD      Allergies    Patient has no known allergies.  Review of Systems   Review of Systems  Constitutional:  Positive for chills, fatigue and fever. Negative for diaphoresis.  HENT:  Negative for congestion, rhinorrhea and sneezing.   Eyes: Negative.   Respiratory:  Negative for cough, chest tightness and shortness of breath.   Cardiovascular:  Negative for chest pain and leg swelling.  Gastrointestinal:  Positive for abdominal pain. Negative for blood in stool, diarrhea, nausea and vomiting.  Genitourinary:  Negative for difficulty urinating, flank pain, frequency and hematuria.  Musculoskeletal:  Negative for arthralgias and back pain.  Skin:  Negative for rash.  Neurological:  Negative for dizziness, speech difficulty, weakness, numbness and headaches.    Physical Exam Updated Vital Signs BP  104/60   Pulse 88   Temp 98.7 F (37.1 C)   Resp 18   SpO2 98%  Physical Exam Constitutional:      Appearance: She is well-developed.  HENT:     Head: Normocephalic and atraumatic.  Eyes:     Pupils: Pupils are equal, round, and reactive to light.  Cardiovascular:     Rate and Rhythm: Normal rate and regular rhythm.     Heart sounds: Normal heart sounds.  Pulmonary:     Effort: Pulmonary effort is normal. No respiratory distress.     Breath sounds: Normal breath sounds. No wheezing or rales.  Chest:     Chest wall: No tenderness.  Abdominal:     General: Bowel sounds are normal.     Palpations: Abdomen is soft.     Tenderness: There is abdominal tenderness in the right lower quadrant and suprapubic area. There is no guarding or rebound.  Musculoskeletal:        General: Normal range of motion.     Cervical back: Normal range of motion and neck supple.  Lymphadenopathy:     Cervical: No cervical adenopathy.  Skin:    General: Skin is warm and dry.     Findings: No rash.  Neurological:     Mental Status: She is alert and oriented to person, place, and time.     ED Results / Procedures / Treatments   Labs (all labs ordered are listed, but only abnormal results are displayed) Labs Reviewed  COMPREHENSIVE METABOLIC PANEL WITH GFR - Abnormal; Notable for the following components:      Result Value   Potassium 2.8 (*)    Creatinine, Ser 1.52 (*)    Calcium  8.5 (*)    Total Protein 6.3 (*)    Albumin 3.1 (*)    Total Bilirubin 1.8 (*)    GFR, Estimated 34 (*)    All other components within normal limits  CBC WITH DIFFERENTIAL/PLATELET - Abnormal; Notable for the following components:   WBC 16.7 (*)    Platelets 103 (*)    Neutro Abs 12.9 (*)    Monocytes Absolute 1.3 (*)    Abs Immature Granulocytes 0.11 (*)    All other components within normal limits  PROTIME-INR - Abnormal; Notable for the following components:   Prothrombin Time 19.1 (*)    INR 1.6 (*)    All  other components within normal limits  URINALYSIS, W/ REFLEX TO CULTURE (INFECTION SUSPECTED) - Abnormal; Notable for the following components:   APPearance CLOUDY (*)    Specific Gravity, Urine 1.045 (*)    Ketones, ur 5 (*)    Leukocytes,Ua TRACE (*)    Bacteria, UA MANY (*)    Non Squamous Epithelial 0-5 (*)    All other  components within normal limits  I-STAT CG4 LACTIC ACID, ED - Abnormal; Notable for the following components:   Lactic Acid, Venous 2.4 (*)    All other components within normal limits  I-STAT CHEM 8, ED - Abnormal; Notable for the following components:   Potassium 2.7 (*)    Creatinine, Ser 1.50 (*)    Calcium , Ion 1.07 (*)    All other components within normal limits  CULTURE, BLOOD (ROUTINE X 2)  CULTURE, BLOOD (ROUTINE X 2)  MAGNESIUM  I-STAT CG4 LACTIC ACID, ED    EKG None  Radiology CT ABDOMEN PELVIS W CONTRAST Result Date: 10/16/2023 CLINICAL DATA:  Abdominal pain, acute, nonlocalized EXAM: CT ABDOMEN AND PELVIS WITH CONTRAST TECHNIQUE: Multidetector CT imaging of the abdomen and pelvis was performed using the standard protocol following bolus administration of intravenous contrast. RADIATION DOSE REDUCTION: This exam was performed according to the departmental dose-optimization program which includes automated exposure control, adjustment of the mA and/or kV according to patient size and/or use of iterative reconstruction technique. CONTRAST:  60mL OMNIPAQUE  IOHEXOL  350 MG/ML SOLN COMPARISON:  CT abdomen/pelvis dated 06/06/2022. FINDINGS: Lower chest: Similar bibasilar subpleural reticular changes with possible mild honeycombing. Hepatobiliary: No focal liver abnormality is seen. No gallstones, gallbladder wall thickening, or biliary dilatation. Pancreas: Unremarkable. No pancreatic ductal dilatation or surrounding inflammatory changes. Spleen: Normal in size without focal abnormality. Adrenals/Urinary Tract: Adrenal glands are unremarkable. Kidneys are normal,  without renal calculi, focal lesion, or hydronephrosis. Bladder is unremarkable. Stomach/Bowel: Extensive sigmoid colonic diverticulosis with mid to distal sigmoid colonic wall thickening, submucosal edema, pericolonic fat stranding, and trace fluid, most compatible with acute diverticulitis. No evidence of perforation or abscess. Diffuse colonic diverticulosis. Similar moderate-to-large hiatal hernia. Small bowel is grossly unremarkable. No evidence of obstruction. Vascular/Lymphatic: Aortic atherosclerosis. No enlarged abdominal or pelvic lymph nodes. Reproductive: Status post hysterectomy. No adnexal masses. Other: No intraperitoneal free air. Tiny fat containing umbilical hernia. Musculoskeletal: Diffuse osseous demineralization. Status post left hip arthroplasty. Stable mild L3 endplate compression deformity. No acute osseous abnormality. IMPRESSION: 1. Acute sigmoid colonic diverticulitis. No evidence of perforation or abscess. 2. Similar moderate-to-large hiatal hernia. 3. Aortic Atherosclerosis (ICD10-I70.0). Electronically Signed   By: Mannie Seek M.D.   On: 10/16/2023 15:03   DG Chest Port 1 View Result Date: 10/16/2023 CLINICAL DATA:  Questionable sepsis - evaluate for abnormality EXAM: PORTABLE CHEST 1 VIEW COMPARISON:  01/14/2021. FINDINGS: Stable mild cardiomegaly. Aortic atherosclerosis. Low lung volumes. No focal consolidation, sizeable pleural effusion, or pneumothorax. Surgical clips in the right axilla. No acute osseous abnormality. IMPRESSION: 1. No acute cardiopulmonary findings. 2. Stable mild cardiomegaly. Electronically Signed   By: Mannie Seek M.D.   On: 10/16/2023 14:53    Procedures Procedures    Medications Ordered in ED Medications  lactated ringers  infusion ( Intravenous New Bag/Given 10/16/23 1233)  cefTRIAXone  (ROCEPHIN ) 1 g in sodium chloride  0.9 % 100 mL IVPB (has no administration in time range)  metroNIDAZOLE  (FLAGYL ) IVPB 500 mg (has no administration in  time range)  tamoxifen  (NOLVADEX ) tablet 20 mg (has no administration in time range)  metoprolol  tartrate (LOPRESSOR ) tablet 25 mg (has no administration in time range)  rosuvastatin  (CRESTOR ) tablet 10 mg (has no administration in time range)  ALPRAZolam  (XANAX ) tablet 0.5 mg (has no administration in time range)  sertraline  (ZOLOFT ) tablet 50 mg (has no administration in time range)  levothyroxine  (SYNTHROID ) tablet 137 mcg (has no administration in time range)  apixaban  (ELIQUIS ) tablet 5 mg (has no administration in  time range)  gabapentin  (NEURONTIN ) capsule 100 mg (has no administration in time range)  sodium chloride  flush (NS) 0.9 % injection 3 mL (has no administration in time range)  acetaminophen  (TYLENOL ) tablet 650 mg (has no administration in time range)    Or  acetaminophen  (TYLENOL ) suppository 650 mg (has no administration in time range)  polyethylene glycol (MIRALAX  / GLYCOLAX ) packet 17 g (has no administration in time range)  HYDROmorphone  (DILAUDID ) injection 0.5-1 mg (has no administration in time range)  HYDROcodone -acetaminophen  (NORCO/VICODIN) 5-325 MG per tablet 1 tablet (has no administration in time range)  sodium chloride  0.9 % bolus 500 mL (0 mLs Intravenous Stopped 10/16/23 1224)  ceFEPIme  (MAXIPIME ) 2 g in sodium chloride  0.9 % 100 mL IVPB (0 g Intravenous Stopped 10/16/23 1224)  metroNIDAZOLE  (FLAGYL ) IVPB 500 mg (0 mg Intravenous Stopped 10/16/23 1404)  vancomycin  (VANCOCIN ) IVPB 1000 mg/200 mL premix (0 mg Intravenous Stopped 10/16/23 1404)  potassium chloride  10 mEq in 100 mL IVPB (0 mEq Intravenous Stopped 10/16/23 1521)  iohexol  (OMNIPAQUE ) 350 MG/ML injection 60 mL (60 mLs Intravenous Contrast Given 10/16/23 1310)    ED Course/ Medical Decision Making/ A&P                                 Medical Decision Making Amount and/or Complexity of Data Reviewed Labs: ordered. Radiology: ordered.  Risk Prescription drug management. Decision regarding  hospitalization.   Patient is an 83 year old female who presents with abdominal pain associated with fever and chills.  She had a temperature of 101 at home although she was afebrile here.  Her blood pressure was 88 systolic at home.  Here, her initial blood pressure was in the 90s.  Her lactate is elevated at 2.4.  Her WBC count is elevated at 16,000.  Given these findings, code sepsis was initiated and she was started on IV fluids and IV antibiotics.  Her lactate cleared on recheck.  Her electrolytes show a markedly low potassium at 2.7.  She was started on IV potassium replacement.  CT scan was performed which shows evidence of diverticulitis without complicating features.  Chest x-ray two-view was interpreted by me and confirmed by the radiologist to show no evidence of pneumonia.  Will consult the hospitalist for admission.  Discussed with Dr. Rufina Cough.  CRITICAL CARE Performed by: Hershel Los Total critical care time: 70 minutes Critical care time was exclusive of separately billable procedures and treating other patients. Critical care was necessary to treat or prevent imminent or life-threatening deterioration. Critical care was time spent personally by me on the following activities: development of treatment plan with patient and/or surrogate as well as nursing, discussions with consultants, evaluation of patient's response to treatment, examination of patient, obtaining history from patient or surrogate, ordering and performing treatments and interventions, ordering and review of laboratory studies, ordering and review of radiographic studies, pulse oximetry and re-evaluation of patient's condition.   Final Clinical Impression(s) / ED Diagnoses Final diagnoses:  Sepsis, due to unspecified organism, unspecified whether acute organ dysfunction present [facility])  Diverticulitis  Hypokalemia    Rx / DC Orders ED Discharge Orders     None         Hershel Los, MD 10/16/23 [phone]

## 2023-10-16 NOTE — Telephone Encounter (Signed)
 Copied from CRM [id number]. Topic: Clinical - Red Word Triage >> Oct 16, 2023  8:39 AM Carrielelia G wrote: Red Word that prompted transfer to Nurse Triage: son calling 82/51 bp,  fever yesterday was 101, right side pain   Chief Complaint: Low BP, RLQ pain Symptoms: BP 82/51 Frequency: constant Pertinent Negatives: Patient denies fever Disposition: [] ED /[] Urgent Care (no appt availability in office) / [] Appointment(In office/virtual)/ []  [facility]/ [] Home Care/ [] Refused Recommended Disposition /[] [place] [facility]/ []  Follow-up with PCP Additional Notes: Spoke with son, Rodman Clam. Patients BP is 82/51 HR 85. Rodman Clam reports that patient had a fever yesterday but her BP was a 97/51 at that time. Today, patient has no fever, its now 53F, but pts BP is low. 82/51 at 0815 today then 87/48 at 0846.  Per Rodman Clam, pt has been endording right lower abd pain, somewhat relief with ibuprofen.  RN advising ED. Patient and family agreeable.  Reason for Disposition  Patient sounds very sick or weak to the triager  Answer Assessment - Initial Assessment Questions 1. BLOOD PRESSURE: "What is the blood pressure?" "Did you take at least two measurements 5 minutes apart?"     82/51 then  87/48 98  2. ONSET: "When did you take your blood pressure?"     0815 this morning  3. HOW: "How did you obtain the blood pressure?" (e.g., visiting nurse, automatic home BP monitor)     Home BP monitor  4. HISTORY: "Do you have a history of low blood pressure?" "What is your blood pressure normally?"     Yes, normally runs low  5. MEDICINES: "Are you taking any medications for blood pressure?" If Yes, ask: "Have they been changed recently?"     Amlodipine , losartan, and metoprolol   6. PULSE RATE: "Do you know what your pulse rate is?"      85 HR  7. OTHER SYMPTOMS: "Have you been sick recently?" "Have you had a recent injury?"     Has been sick lately. Had a fever yesterday and RLQ abd pain  8. PREGNANCY:  "Is there any chance you are pregnant?" "When was your last menstrual period?"     no  Protocols used: Blood Pressure - Low-A-AH

## 2023-10-16 NOTE — H&P (Signed)
 History and Physical   Deborah Randall ZOX:[id number] DOB: 1940-08-11 DOA: 10/16/2023  PCP: Estil Heman, NP   Patient coming from: Home  Chief Complaint: Nausea, abdominal pain, fever  HPI: Deborah Randall is a 83 y.o. female with medical history significant of hypertension, hyperlipidemia, hypothyroidism, atrial fibrillation, CKD 3, breast cancer, depression, anxiety, CHF presenting with nausea, abdominal pain, fever.  Patient was cold yesterday per family and also had fevers as high as 1-1.4.  Patient reports lower abdominal pain.  Denies Chest pain, shortness of breath, constipation, diarrhea.  ED Course: Vital signs in the ED notable for blood pressure in the 90s-100 systolic.  Lab workup included CMP with potassium 2.8, creatinine stable at 1.52, calcium  8.5, protein 6.3, albumin 3.1, T. bili 1.8.  CBC with leukocytosis to 16.7, platelets 103.  PT and INR mildly elevated at 19.1 and 1.6 respectively.  Lactic acid initially mildly elevated at 2.4 with repeat normal.  Urinalysis with ketones, rare leukocytes, many bacteria.  Blood cultures pending.  Chest x-ray showed no acute abnormality.  CT of the abdomen pelvis showed acute diverticulitis.  Patient initially received vancomycin, cefepime, Flagyl in the ED.  Also received 500 cc IV fluid and started on a rate of fluid.  Received 20 mEq IV potassium as well.  Review of Systems: As per HPI otherwise all other systems reviewed and are negative.  Past Medical History:  Diagnosis Date   Anxiety    Atrial fibrillation with rapid ventricular response (HCC) 07/07/2015   Bilateral breast cancer (HCC)    Daily headache    Depression    GERD (gastroesophageal reflux disease)    Hyperlipidemia    Hypertension    Hypothyroidism    Iron  deficiency anemia 2000s   Lumbago    Nausea alone    PONV (postoperative nausea and vomiting)    Senile osteoporosis    Squamous cell carcinoma of forehead    "I think they burned it off"    Past Surgical  History:  Procedure Laterality Date   ABDOMINAL HYSTERECTOMY  1970s   BREAST BIOPSY Bilateral    BREAST BIOPSY Right 05/18/2022   US  RT BREAST BX W LOC DEV 1ST LESION IMG BX SPEC US  GUIDE 05/18/2022 [facility]   BREAST LUMPECTOMY Right 2002   "w/lymph nodes removed"   CARDIOVERSION N/A 08/19/2015   Procedure: CARDIOVERSION;  Surgeon: Luana Rumple, MD;  Location: [facility];  Service: Cardiovascular;  Laterality: N/A;   CATARACT EXTRACTION W/ INTRAOCULAR LENS  IMPLANT, BILATERAL Bilateral 2000s   DILATION AND CURETTAGE OF UTERUS  ~ 1968   ELBOW FRACTURE SURGERY Left 2011   "has 2 screws in it"   EXCISIONAL HEMORRHOIDECTOMY  ~ 1970   FRACTURE SURGERY  02/12/2016   Right Wrist   HEMIARTHROPLASTY HIP Left 2011   "had ball replaced"   INGUINAL HERNIA REPAIR Right 1980s?   MASTECTOMY Left 2005   MASTECTOMY Right 06/2022   ORIF WRIST FRACTURE Left 02/12/2016   Procedure: OPEN REDUCTION INTERNAL FIXATION LEFT DISTAL RADIUS;  Surgeon: Mauricia South, MD;  Location: [facility];  Service: Plastics;  Laterality: Left;   SIMPLE MASTECTOMY WITH AXILLARY SENTINEL NODE BIOPSY Right 06/22/2022   Procedure: RIGHT SIMPLE MASTECTOMY;  Surgeon: Lockie Rima, MD;  Location: [facility];  Service: General;  Laterality: Right;   TOTAL THYROIDECTOMY  1972   WRIST FRACTURE SURGERY Right    "has a plate and 7 screws in there"    Social History  reports that she has never smoked.  She has never used smokeless tobacco. She reports that she does not drink alcohol and does not use drugs.  No Known Allergies  Family History  Problem Relation Age of Onset   Cancer Father    Lung cancer Sister    Heart disease Sister    Hypertension Sister    Ovarian cancer Sister    Heart attack Brother    Other Brother    Skin cancer Brother    Other Brother    Other Brother    Alzheimer's disease Brother    Bladder Cancer Brother    Diabetes Brother    COPD Brother    Mental illness Son    Mental illness Son    Reviewed on admission  Prior to Admission medications   Medication Sig Start Date End Date Taking? Authorizing Provider  acetaminophen  (TYLENOL ) 325 MG tablet Take 2 tablets (650 mg total) by mouth every 4 (four) hours as needed for headache or mild pain. Patient not taking: Reported on 10/12/2023 07/08/15   Abel Hoe, PA-C  albuterol  (VENTOLIN  HFA) 108 (90 Base) MCG/ACT inhaler Inhale 1 puff into the lungs every 6 (six) hours as needed for shortness of breath.    [provider]  ALPRAZolam  (XANAX ) 0.5 MG tablet TAKE ONE TABLET BY MOUTH DAILY FOR ANXIETY 08/20/23   Ngetich, Dinah C, NP  calcium  citrate (CALCITRATE - DOSED IN MG ELEMENTAL CALCIUM ) 950 (200 Ca) MG tablet Take 200 mg of elemental calcium  by mouth daily.    [provider]  Cholecalciferol  (VITAMIN D3) 2000 UNITS TABS Take 2,000 Units by mouth daily.    [provider]  docusate sodium  (COLACE) 100 MG capsule Take 1 capsule (100 mg total) by mouth daily. 10/06/21   Ngetich, Dinah C, NP  ELIQUIS  5 MG TABS tablet TAKE 1 TABLET BY MOUTH TWICE DAILY 09/07/23   Croitoru, Mihai, MD  furosemide  (LASIX ) 40 MG tablet TAKE 1 TABLET BY MOUTH DAILY 08/29/23   Ngetich, Dinah C, NP  gabapentin  (NEURONTIN ) 100 MG capsule Take 1 capsule (100 mg total) by mouth 2 (two) times daily. 06/23/22   Lockie Rima, MD  gabapentin  (NEURONTIN ) 100 MG capsule TAKE 1 CAPSULE BY MOUTH AT BEDTIME 05/07/23   Ngetich, Dinah C, NP  ibuprofen (ADVIL) 200 MG tablet Take 200 mg by mouth daily as needed for headache or mild pain.    [provider]  Iron , Ferrous Sulfate , 325 (65 Fe) MG TABS Take 325 mg by mouth daily. 10/06/21   Ngetich, Dinah C, NP  levothyroxine  (SYNTHROID ) 137 MCG tablet TAKE 1 TABLET BY MOUTH DAILY 06/28/23   Ngetich, Dinah C, NP  meclizine  (ANTIVERT ) 12.5 MG tablet Take 12.5 mg by mouth as needed for dizziness.    [provider]  metoprolol  tartrate (LOPRESSOR ) 25 MG tablet Take 1 tablet (25 mg total) by  mouth 2 (two) times daily. 08/29/23   Croitoru, Mihai, MD  potassium chloride  SA (KLOR-CON  M) 20 MEQ tablet TAKE 2 TABLETS BY MOUTH DAILY 08/20/23   Ngetich, Dinah C, NP  rosuvastatin  (CRESTOR ) 10 MG tablet TAKE 1 TABLET BY MOUTH DAILY 02/28/23   Ngetich, Dinah C, NP  sertraline  (ZOLOFT ) 25 MG tablet TAKE 2 TABLETS BY MOUTH DAILY 06/15/23   Ngetich, Dinah C, NP  tamoxifen  (NOLVADEX ) 20 MG tablet TAKE 1 TABLET BY MOUTH DAILY 08/07/23   Murleen Arms, MD    Physical Exam: Vitals:   10/16/23 1252 10/16/23 1320 10/16/23 1333 10/16/23 1500  BP:  (!) 71/44 107/62 100/68  Pulse:   82 78  Resp:  13 (!) 25 17  Temp: 98.7 F (37.1 C)  98.5 F (36.9 C)   TempSrc: Rectal     SpO2:   99% 100%    Physical Exam Constitutional:      General: She is not in acute distress.    Appearance: Normal appearance.  HENT:     Head: Normocephalic and atraumatic.     Mouth/Throat:     Mouth: Mucous membranes are moist.     Pharynx: Oropharynx is clear.  Eyes:     Extraocular Movements: Extraocular movements intact.     Pupils: Pupils are equal, round, and reactive to light.  Cardiovascular:     Rate and Rhythm: Normal rate and regular rhythm.     Pulses: Normal pulses.     Heart sounds: Normal heart sounds.  Pulmonary:     Effort: Pulmonary effort is normal. No respiratory distress.     Breath sounds: Normal breath sounds.  Abdominal:     General: Bowel sounds are normal. There is no distension.     Palpations: Abdomen is soft.     Tenderness: There is abdominal tenderness.  Musculoskeletal:        General: No swelling or deformity.  Skin:    General: Skin is warm and dry.  Neurological:     General: No focal deficit present.     Mental Status: Mental status is at baseline.    Labs on Admission: I have personally reviewed following labs and imaging studies  CBC: Recent Labs  Lab 10/16/23 1045 10/16/23 1118  WBC 16.7*  --   NEUTROABS 12.9*  --   HGB 14.0 14.6  HCT 45.0 43.0  MCV 97.8  --    PLT 103*  --     Basic Metabolic Panel: Recent Labs  Lab 10/16/23 1045 10/16/23 1118  NA 140 140  K 2.8* 2.7*  CL 103 100  CO2 25  --   GLUCOSE 94 94  BUN 16 19  CREATININE 1.52* 1.50*  CALCIUM  8.5*  --     GFR: Estimated Creatinine Clearance: 24.5 mL/min (A) (by C-G formula based on SCr of 1.5 mg/dL (H)).  Liver Function Tests: Recent Labs  Lab 10/16/23 1045  AST 17  ALT 8  ALKPHOS 48  BILITOT 1.8*  PROT 6.3*  ALBUMIN 3.1*    Urine analysis:    Component Value Date/Time   COLORURINE YELLOW 10/16/2023 1430   APPEARANCEUR CLOUDY (A) 10/16/2023 1430   LABSPEC 1.045 (H) 10/16/2023 1430   PHURINE 5.0 10/16/2023 1430   GLUCOSEU NEGATIVE 10/16/2023 1430   HGBUR NEGATIVE 10/16/2023 1430   BILIRUBINUR NEGATIVE 10/16/2023 1430   KETONESUR 5 (A) 10/16/2023 1430   PROTEINUR NEGATIVE 10/16/2023 1430   UROBILINOGEN 0.2 01/26/2010 1055   NITRITE NEGATIVE 10/16/2023 1430   LEUKOCYTESUR TRACE (A) 10/16/2023 1430    Radiological Exams on Admission: CT ABDOMEN PELVIS W CONTRAST Result Date: 10/16/2023 CLINICAL DATA:  Abdominal pain, acute, nonlocalized EXAM: CT ABDOMEN AND PELVIS WITH CONTRAST TECHNIQUE: Multidetector CT imaging of the abdomen and pelvis was performed using the standard protocol following bolus administration of intravenous contrast. RADIATION DOSE REDUCTION: This exam was performed according to the departmental dose-optimization program which includes automated exposure control, adjustment of the mA and/or kV according to patient size and/or use of iterative reconstruction technique. CONTRAST:  60mL OMNIPAQUE  IOHEXOL  350 MG/ML SOLN COMPARISON:  CT abdomen/pelvis dated 06/06/2022. FINDINGS: Lower chest: Similar bibasilar subpleural reticular changes with possible mild  honeycombing. Hepatobiliary: No focal liver abnormality is seen. No gallstones, gallbladder wall thickening, or biliary dilatation. Pancreas: Unremarkable. No pancreatic ductal dilatation or  surrounding inflammatory changes. Spleen: Normal in size without focal abnormality. Adrenals/Urinary Tract: Adrenal glands are unremarkable. Kidneys are normal, without renal calculi, focal lesion, or hydronephrosis. Bladder is unremarkable. Stomach/Bowel: Extensive sigmoid colonic diverticulosis with mid to distal sigmoid colonic wall thickening, submucosal edema, pericolonic fat stranding, and trace fluid, most compatible with acute diverticulitis. No evidence of perforation or abscess. Diffuse colonic diverticulosis. Similar moderate-to-large hiatal hernia. Small bowel is grossly unremarkable. No evidence of obstruction. Vascular/Lymphatic: Aortic atherosclerosis. No enlarged abdominal or pelvic lymph nodes. Reproductive: Status post hysterectomy. No adnexal masses. Other: No intraperitoneal free air. Tiny fat containing umbilical hernia. Musculoskeletal: Diffuse osseous demineralization. Status post left hip arthroplasty. Stable mild L3 endplate compression deformity. No acute osseous abnormality. IMPRESSION: 1. Acute sigmoid colonic diverticulitis. No evidence of perforation or abscess. 2. Similar moderate-to-large hiatal hernia. 3. Aortic Atherosclerosis (ICD10-I70.0). Electronically Signed   By: Mannie Seek M.D.   On: 10/16/2023 15:03   DG Chest Port 1 View Result Date: 10/16/2023 CLINICAL DATA:  Questionable sepsis - evaluate for abnormality EXAM: PORTABLE CHEST 1 VIEW COMPARISON:  01/14/2021. FINDINGS: Stable mild cardiomegaly. Aortic atherosclerosis. Low lung volumes. No focal consolidation, sizeable pleural effusion, or pneumothorax. Surgical clips in the right axilla. No acute osseous abnormality. IMPRESSION: 1. No acute cardiopulmonary findings. 2. Stable mild cardiomegaly. Electronically Signed   By: Mannie Seek M.D.   On: 10/16/2023 14:53   EKG: Independently reviewed.  Atrial fibrillation at 80 bpm.  Nonspecific T wave changes.  Assessment/Plan Principal Problem:   Acute  diverticulitis Active Problems:   Hypothyroidism   Essential hypertension, benign   Hypercholesterolemia   Chronic renal insufficiency, stage III (moderate) (HCC)   Persistent atrial fibrillation (HCC)   Recurrent major depressive disorder, in partial remission (HCC)   Generalized anxiety disorder   Malignant neoplasm of upper-outer quadrant of right breast in female, estrogen receptor positive (HCC)   Chronic congestive heart failure, unspecified heart failure type (HCC)   Acute diverticulitis > Patient presented with nausea, abdominal pain, fever. > Found to have leukocytosis to 16.7 and CT abdomen pelvis showed acute diverticulitis. > Initial lactic acid 2.4, normal on repeat. > Received broad-spectrum antibiotics in the ED as well as 500 cc IV fluid bolus. - Monitor on telemetry overnight - Transition antibiotics to ceftriaxone and Flagyl - Continue with IV fluids - Trend fever curve and WBC - Bowel rest - Follow-up blood cultures  Hypertension - Continue home metoprolol  - Hold Lasix  with low normal blood pressure  Hyperlipidemia - Continue home rosuvastatin   Hypothyroidism - Continue home Synthroid   Atrial fibrillation - Continue home metoprolol , Eliquis   CKD 3 > Creatinine in the ED 1.5 which is near baseline of around 1.3. - IV fluids as above - Trend renal function and electrolytes  Chronic CHF > Last echo earlier this year showed EF 55-60%, indeterminate diastolic function, low-normal RV function - Hold Lasix  as above - Continue metoprolol   History of breast cancer - Continue home tamoxifen   Depression Anxiety - Continue home sertraline , as needed Xanax   DVT prophylaxis: Eliquis  Code Status:   Full Family Communication:  Updated at bedside  Disposition Plan:   Patient is from:  Home  Anticipated DC to:  Home  Anticipated DC date:  1 to 3 days  Anticipated DC barriers: None  Consults called:  None Admission status:  Observation,  telemetry  Severity of Illness:  The appropriate patient status for this patient is OBSERVATION. Observation status is judged to be reasonable and necessary in order to provide the required intensity of service to ensure the patient's safety. The patient's presenting symptoms, physical exam findings, and initial radiographic and laboratory data in the context of their medical condition is felt to place them at decreased risk for further clinical deterioration. Furthermore, it is anticipated that the patient will be medically stable for discharge from the hospital within 2 midnights of admission.    Johnetta Nab MD [facility]  How to contact the [facility] Attending or Consulting provider 7A - 7P or covering provider during after hours 7P -7A, for this patient?   Check the care team in [facility] and look for a) attending/consulting [facility] provider listed and b) the [facility] team listed Log into [email or web address] and use [place]'s universal password to access. If you do not have the password, please contact the hospital operator. Locate the [facility] provider you are looking for under [facility] and page to a number that you can be directly reached. If you still have difficulty reaching the provider, please page the [facility] (Director on Call) for the Hospitalists listed on amion for assistance.  10/16/2023, 3:57 PM

## 2023-10-16 NOTE — Progress Notes (Signed)
 Pt arrived to 6 north room 20 alert and oriented x4. Pain level 0/10. Abdomen tender to touch. Bed in lowest position. Call light in reach. Son at bedside. All needs met at this time

## 2023-10-16 NOTE — Telephone Encounter (Signed)
 I agree with recommendation for ED for further evaluation of fever,right abdominal pain and hypotension.

## 2023-10-16 NOTE — Progress Notes (Signed)
 Pt placed on tele box 6N01. Central monitoring made aware. Weaver,NT second Training and development officer

## 2023-10-16 NOTE — Progress Notes (Signed)
 Elink is following code sepsis.

## 2023-10-16 NOTE — ED Notes (Signed)
 Floor notified that patient is on the way up Pt updated with plan of care. Denies needs.

## 2023-10-17 DIAGNOSIS — E7800 Pure hypercholesterolemia, unspecified: Secondary | ICD-10-CM | POA: Diagnosis not present

## 2023-10-17 DIAGNOSIS — I10 Essential (primary) hypertension: Secondary | ICD-10-CM | POA: Diagnosis not present

## 2023-10-17 DIAGNOSIS — K5792 Diverticulitis of intestine, part unspecified, without perforation or abscess without bleeding: Secondary | ICD-10-CM | POA: Diagnosis not present

## 2023-10-17 DIAGNOSIS — C50411 Malignant neoplasm of upper-outer quadrant of right female breast: Secondary | ICD-10-CM | POA: Diagnosis not present

## 2023-10-17 DIAGNOSIS — I509 Heart failure, unspecified: Secondary | ICD-10-CM | POA: Diagnosis not present

## 2023-10-17 DIAGNOSIS — N183 Chronic kidney disease, stage 3 unspecified: Secondary | ICD-10-CM | POA: Diagnosis not present

## 2023-10-17 DIAGNOSIS — I4819 Other persistent atrial fibrillation: Secondary | ICD-10-CM | POA: Diagnosis not present

## 2023-10-17 DIAGNOSIS — F411 Generalized anxiety disorder: Secondary | ICD-10-CM | POA: Diagnosis not present

## 2023-10-17 DIAGNOSIS — E039 Hypothyroidism, unspecified: Secondary | ICD-10-CM | POA: Diagnosis not present

## 2023-10-17 LAB — COMPREHENSIVE METABOLIC PANEL WITH GFR
ALT: 8 U/L (ref 0–44)
AST: 14 U/L — ABNORMAL LOW (ref 15–41)
Albumin: 2.4 g/dL — ABNORMAL LOW (ref 3.5–5.0)
Alkaline Phosphatase: 38 U/L (ref 38–126)
Anion gap: 8 (ref 5–15)
BUN: 14 mg/dL (ref 8–23)
CO2: 21 mmol/L — ABNORMAL LOW (ref 22–32)
Calcium: 7.8 mg/dL — ABNORMAL LOW (ref 8.9–10.3)
Chloride: 107 mmol/L (ref 98–111)
Creatinine, Ser: 1.34 mg/dL — ABNORMAL HIGH (ref 0.44–1.00)
GFR, Estimated: 39 mL/min — ABNORMAL LOW (ref >60)
Glucose, Bld: 74 mg/dL (ref 70–99)
Potassium: 3.5 mmol/L (ref 3.5–5.1)
Sodium: 136 mmol/L (ref 135–145)
Total Bilirubin: 1.4 mg/dL — ABNORMAL HIGH (ref 0.0–1.2)
Total Protein: 4.9 g/dL — ABNORMAL LOW (ref 6.5–8.1)

## 2023-10-17 LAB — CBC
HCT: 34.6 % — ABNORMAL LOW (ref 36.0–46.0)
Hemoglobin: 11.4 g/dL — ABNORMAL LOW (ref 12.0–15.0)
MCH: 30.6 pg (ref 26.0–34.0)
MCHC: 32.9 g/dL (ref 30.0–36.0)
MCV: 92.8 fL (ref 80.0–100.0)
Platelets: 89 10*3/uL — ABNORMAL LOW (ref 150–400)
RBC: 3.73 MIL/uL — ABNORMAL LOW (ref 3.87–5.11)
RDW: 13.2 % (ref 11.5–15.5)
WBC: 11.8 10*3/uL — ABNORMAL HIGH (ref 4.0–10.5)
nRBC: 0 % (ref 0.0–0.2)

## 2023-10-17 MED ORDER — HYDROMORPHONE HCL 1 MG/ML IJ SOLN: 0.5000 mg | INTRAMUSCULAR | Status: DC | PRN

## 2023-10-17 MED ORDER — METRONIDAZOLE 500 MG PO TABS
500.0000 mg | ORAL_TABLET | Freq: Two times a day (BID) | ORAL | Status: DC
  Administered 2023-10-17 – 2023-10-19 (×4): 500 mg via ORAL
  Filled 2023-10-17 (×4): qty 1

## 2023-10-17 MED ORDER — SODIUM CHLORIDE 0.9 % IV SOLN
2.0000 g | Freq: Every day | INTRAVENOUS | Status: DC
  Administered 2023-10-18 – 2023-10-19 (×2): 2 g via INTRAVENOUS
  Filled 2023-10-17 (×2): qty 20

## 2023-10-17 MED ORDER — DEXTROSE-SODIUM CHLORIDE 5-0.9 % IV SOLN: INTRAVENOUS | Status: DC

## 2023-10-17 MED ORDER — SODIUM CHLORIDE 0.9 % IV SOLN: 1.0000 g | Freq: Once | INTRAVENOUS | Status: AC

## 2023-10-17 MED ORDER — PANTOPRAZOLE SODIUM 40 MG PO TBEC
40.0000 mg | DELAYED_RELEASE_TABLET | Freq: Every day | ORAL | Status: DC
  Administered 2023-10-17 – 2023-10-19 (×3): 40 mg via ORAL
  Filled 2023-10-17 (×3): qty 1

## 2023-10-17 NOTE — Assessment & Plan Note (Signed)
 Plan to continue supportive medical therapy, she is not yet back to baseline, continue to have abdominal pain.   Will hold on IV fluids and advance diet to soft.  Antibiotic therapy with IV ceftriaxone and PO metronidazole.  As needed analgesics and antiemetics Antiacids.  Out of bed to chair tid with meals. PT and OT.

## 2023-10-17 NOTE — Assessment & Plan Note (Signed)
 Continue blood pressure control with metoprolol.

## 2023-10-17 NOTE — Assessment & Plan Note (Signed)
 Continue with alprazolam  and sertraline .  Her family is at the bedside at the time of my evaluation

## 2023-10-17 NOTE — Assessment & Plan Note (Signed)
 Renal function with serum cr at 1,34 with K at 3,5 and serum bicarbonate at 21 Na 136   Plan to continue supportive IV fluids with isotonic saline and dextrose . Follow up renal function and electrolytes in am

## 2023-10-17 NOTE — Assessment & Plan Note (Signed)
Follow up as outpatient.  Continue with tamoxifen.

## 2023-10-17 NOTE — Progress Notes (Signed)
 Ordered clear liquid tray. Place IV team consult for new IV

## 2023-10-17 NOTE — Assessment & Plan Note (Signed)
 Continue levothyroxine 

## 2023-10-17 NOTE — Progress Notes (Signed)
  Progress Note   Patient: Deborah Randall:[id number] DOB: 1941/05/07 DOA: 10/16/2023     0 DOS: the patient was seen and examined on 10/17/2023   Brief hospital course: Deborah Randall was admitted to the hospital with the working diagnosis of acute diverticulitis   83 yo female with the past medical history of hypertension, hyperlipidemia, atrial fibrillation, and heart failure who presented with abdominal pain, nausea and vomiting. At home she was found to be febrile. Reported 2 to 3 days of right lower quadrant abdominal pain.   Assessment and Plan: * Acute diverticulitis Plan to continue supportive medical therapy, she is not yet back to baseline  Continue IV fluids with D5NS at 75 ml per her Antibiotic therapy with ceftriaxone and metronidazole.  As needed analgesics and antiemetics Antiacids.  Advance diet to clears.  Out of bed to chair tid with meals.   Chronic congestive heart failure, unspecified heart failure type (HCC) No signs of acute heart failure decompensation Continue close blood pressure monitoring Continue with metoprolol .   Chronic renal insufficiency, stage III (moderate) (HCC) Renal function with serum cr at 1,34 with K at 3,5 and serum bicarbonate at 21 Na 136   Plan to continue supportive IV fluids with isotonic saline and dextrose . Follow up renal function and electrolytes in am   Essential hypertension, benign Continue blood pressure control with metoprolol .   Hypercholesterolemia Continue statin therapy   Generalized anxiety disorder Continue with alprazolam  and sertraline .  Her family is at the bedside at the time of my evaluation   Hypothyroidism Continue levothyroxine    Persistent atrial fibrillation (HCC) Continue rate control with metoprolol  and anticoagulation with apixaban .  Continue telemetry monitoring   Malignant neoplasm of upper-outer quadrant of right breast in female, estrogen receptor positive (HCC) Follow up as outpatient.   Continue with tamoxifen .         Subjective: Patient with improvement in abdominal pain but not yet back to baseline, she has been NPO since admission, positive bowel movements and positive flatus   Physical Exam: Vitals:   10/16/23 2028 10/17/23 0428 10/17/23 0431 10/17/23 0725  BP: 124/72  115/64 115/63  Pulse: 93  (!) 104 86  Resp: 17  17 16   Temp: 98.6 F (37 C)  98.9 F (37.2 C) 98.8 F (37.1 C)  TempSrc: Oral  Oral Oral  SpO2: 98%  95% 95%  Weight:  (P) 63.1 kg    Height:       Neurology awake and alert ENT with mild pallor  with no icterus, dry mucous membranes Cardiovascular with S1 and S2 present and regular with no gallops, rubs or murmurs Respiratory with no rales or wheezing, no rhonchi  Abdomen with no distention, tender to palpation at the lower quadrants with no rebound, or guarding no lower extremity edema  Data Reviewed:    Family Communication: I spoke with patient's son and granddaughter at the bedside, we talked in detail about patient's condition, plan of care and prognosis and all questions were addressed.   Disposition: Status is: Observation The patient remains OBS appropriate and will d/c before 2 midnights.  Planned Discharge Destination: Home    Author: Albertus Alt, MD 10/17/2023 7:45 AM  For on call review [email or web address].

## 2023-10-17 NOTE — Assessment & Plan Note (Signed)
 No signs of acute heart failure decompensation Continue close blood pressure monitoring Continue with metoprolol .

## 2023-10-17 NOTE — Assessment & Plan Note (Signed)
New diagnosis 1 month ago.  Remains in atrial fibrillation with controlled rate.  Patient has elected against anticoagulation due to history of epistaxis.  CHA2DS2-VASc score is at least 4 suggesting a 4.8% yearly stroke risk.  Risk versus benefits discussed with patient.  Recommended he discuss further with his cardiology team. -Continue Toprol-XL -Continue discussions regarding anticoagulation 

## 2023-10-17 NOTE — Assessment & Plan Note (Signed)
 Continue statin therapy.

## 2023-10-17 NOTE — Hospital Course (Addendum)
 Deborah Randall was admitted to the hospital with the working diagnosis of acute diverticulitis   83 yo female with the past medical history of hypertension, hyperlipidemia, atrial fibrillation, and heart failure who presented with abdominal pain, nausea and vomiting. At home she was found to be febrile. Reported 2 to 3 days of right lower quadrant abdominal pain. On her initial physical examination her blood pressure was 71/44, 107/62, HR 82, RR 25 and 02 saturation 99% Lungs with no wheezing or rales, heart with S1 and S2 present, irregularly irregular with no gallops or rubs, abdomen tender to palpation, with no rebound or guarding, no lower extremity edema.   Na 140, K 2.8, Cl 103 bicarbonate 23, glucose 94 bun 16 cr 1,52  AST 17 ALT 8  Lactic acid 2,4 and 1,3  Wbc 16,7 hgb 14.0 plt 103   Urine analysis SG 1,045, negative protein, negative hgb, trace leukocytes, 6-10 wbc.   Chest radiograph with hypoinflation, positive cardiomegaly, with no effusions or infiltrates.   EKG 80 bpm, left axis deviation, normal intervals, qtc 412, atrial fibrillation rhythm with no significant ST segment changes, negative T lead II, III, aVF, V1 to V3.   CT abdomen and pelvis with acute sigmoid colonic diverticulitis. No evidence of perforation or abscess.  Similar moderate to large hiatal hernia.   Patient was placed on supportive medical therapy and antibiotic therapy with improvement in her symptoms.  Diet was advanced with good toleration.  05/30 patient stable for discharge home today and follow up as outpatient with primary care.

## 2023-10-17 NOTE — Progress Notes (Signed)
 Mobility Specialist Progress Note:    10/17/23 1012  Mobility  Activity Ambulated with assistance in hallway  Level of Assistance Contact guard assist, steadying assist  Assistive Device Front wheel walker  Distance Ambulated (ft) 80 ft  Activity Response Tolerated well  Mobility Referral Yes  Mobility visit 1 Mobility  Mobility Specialist Start Time (ACUTE ONLY) 1000  Mobility Specialist Stop Time (ACUTE ONLY) 1012  Mobility Specialist Time Calculation (min) (ACUTE ONLY) 12 min   Pt received in bed agreeable to mobility. No c/o throughout. No physical assistance needed, contact guard for safety. Distance limited d/t pt requesting to use the BR. Left pt in BR, instructed to use call light when finished. All needs met. Son in room.   Inetta Manes Mobility Specialist  Please contact vis Secure Chat or  Rehab Office [phone]

## 2023-10-17 NOTE — Plan of Care (Signed)
  Problem: Pain Managment: Goal: General experience of comfort will improve and/or be controlled Outcome: Progressing   Problem: Safety: Goal: Ability to remain free from injury will improve Outcome: Progressing

## 2023-10-18 DIAGNOSIS — I509 Heart failure, unspecified: Secondary | ICD-10-CM | POA: Diagnosis not present

## 2023-10-18 DIAGNOSIS — F411 Generalized anxiety disorder: Secondary | ICD-10-CM | POA: Diagnosis not present

## 2023-10-18 DIAGNOSIS — C50411 Malignant neoplasm of upper-outer quadrant of right female breast: Secondary | ICD-10-CM | POA: Diagnosis not present

## 2023-10-18 DIAGNOSIS — I10 Essential (primary) hypertension: Secondary | ICD-10-CM | POA: Diagnosis not present

## 2023-10-18 DIAGNOSIS — E039 Hypothyroidism, unspecified: Secondary | ICD-10-CM | POA: Diagnosis not present

## 2023-10-18 DIAGNOSIS — I4819 Other persistent atrial fibrillation: Secondary | ICD-10-CM | POA: Diagnosis not present

## 2023-10-18 DIAGNOSIS — E7800 Pure hypercholesterolemia, unspecified: Secondary | ICD-10-CM | POA: Diagnosis not present

## 2023-10-18 DIAGNOSIS — N183 Chronic kidney disease, stage 3 unspecified: Secondary | ICD-10-CM | POA: Diagnosis not present

## 2023-10-18 DIAGNOSIS — K5792 Diverticulitis of intestine, part unspecified, without perforation or abscess without bleeding: Secondary | ICD-10-CM | POA: Diagnosis not present

## 2023-10-18 LAB — CBC
HCT: 33.3 % — ABNORMAL LOW (ref 36.0–46.0)
Hemoglobin: 11.1 g/dL — ABNORMAL LOW (ref 12.0–15.0)
MCH: 30.8 pg (ref 26.0–34.0)
MCHC: 33.3 g/dL (ref 30.0–36.0)
MCV: 92.5 fL (ref 80.0–100.0)
Platelets: 100 10*3/uL — ABNORMAL LOW (ref 150–400)
RBC: 3.6 MIL/uL — ABNORMAL LOW (ref 3.87–5.11)
RDW: 13.1 % (ref 11.5–15.5)
WBC: 10.7 10*3/uL — ABNORMAL HIGH (ref 4.0–10.5)
nRBC: 0 % (ref 0.0–0.2)

## 2023-10-18 LAB — BASIC METABOLIC PANEL WITH GFR
Anion gap: 11 (ref 5–15)
BUN: 11 mg/dL (ref 8–23)
CO2: 24 mmol/L (ref 22–32)
Calcium: 8.1 mg/dL — ABNORMAL LOW (ref 8.9–10.3)
Chloride: 106 mmol/L (ref 98–111)
Creatinine, Ser: 1.18 mg/dL — ABNORMAL HIGH (ref 0.44–1.00)
GFR, Estimated: 46 mL/min — ABNORMAL LOW (ref >60)
Glucose, Bld: 119 mg/dL — ABNORMAL HIGH (ref 70–99)
Potassium: 3.4 mmol/L — ABNORMAL LOW (ref 3.5–5.1)
Sodium: 141 mmol/L (ref 135–145)

## 2023-10-18 MED ORDER — POTASSIUM CHLORIDE CRYS ER 20 MEQ PO TBCR
40.0000 meq | EXTENDED_RELEASE_TABLET | ORAL | Status: AC
  Administered 2023-10-18 (×2): 40 meq via ORAL
  Filled 2023-10-18 (×2): qty 2

## 2023-10-18 NOTE — Progress Notes (Signed)
 Progress Note   Patient: Deborah Randall ZOX:[id number] DOB: 1940-07-29 DOA: 10/16/2023     0 DOS: the patient was seen and examined on 10/18/2023   Brief hospital course: Deborah Randall was admitted to the hospital with the working diagnosis of acute diverticulitis   83 yo female with the past medical history of hypertension, hyperlipidemia, atrial fibrillation, and heart failure who presented with abdominal pain, nausea and vomiting. At home she was found to be febrile. Reported 2 to 3 days of right lower quadrant abdominal pain. On her initial physical examination her blood pressure was 71/44, 107/62, HR 82, RR 25 and 02 saturation 99% Lungs with no wheezing or rales, heart with S1 and S2 present, irregularly irregular with no gallops or rubs, abdomen tender to palpation, with no rebound or guarding, no lower extremity edema.   Na 140, K 2.8, Cl 103 bicarbonate 23, glucose 94 bun 16 cr 1,52  AST 17 ALT 8  Lactic acid 2,4 and 1,3  Wbc 16,7 hgb 14.0 plt 103   Urine analysis SG 1,045, negative protein, negative hgb, trace leukocytes, 6-10 wbc.   Chest radiograph with hypoinflation, positive cardiomegaly, with no effusions or infiltrates.   EKG 80 bpm, left axis deviation, normal intervals, qtc 412, atrial fibrillation rhythm with no significant ST segment changes, negative T lead II, III, aVF, V1 to V3.   CT abdomen and pelvis with acute sigmoid colonic diverticulitis. No evidence of perforation or abscess.  Similar moderate to large hiatal hernia.   Assessment and Plan: * Acute diverticulitis Plan to continue supportive medical therapy, she is not yet back to baseline, continue to have abdominal pain.   Will hold on IV fluids and advance diet to soft.  Antibiotic therapy with IV ceftriaxone  and PO metronidazole .  As needed analgesics and antiemetics Antiacids.  Out of bed to chair tid with meals. PT and OT.   Chronic congestive heart failure, unspecified heart failure type  (HCC) Echocardiogram with preserved LV systolic function with EF 55 to 60%, RV systolic function lowe normal, RVSP 27.0 mmHg, LA and RA with moderate dilatation, mild to moderate tricuspid regurgitation,   No signs of volume overload.  Plan to hold on IV fluids and continue blood pressure monitoring. Continue with metoprolol .   Chronic renal insufficiency, stage III (moderate) (HCC) CKD stage 3a, AKI, hypokalemia.   Renal function with serum cr at 1,18 with K at 3,4 and serum bicarbonate at 24  Na 141   Plan to hold on IV fluids and advance diet to soft.  Add Kcl 40 meq x2 and follow up renal function and electrolytes in am.   Essential hypertension, benign Continue blood pressure control with metoprolol .   Hypercholesterolemia Continue statin therapy   Generalized anxiety disorder Continue with alprazolam  and sertraline .   Hypothyroidism Continue levothyroxine    Persistent atrial fibrillation (HCC) Continue rate control with metoprolol  and anticoagulation with apixaban .  Ok to discontinue telemetry monitoring   Malignant neoplasm of upper-outer quadrant of right breast in female, estrogen receptor positive (HCC) Follow up as outpatient.  Continue with tamoxifen .     Subjective: Patient is feeling better but not yet back to baseline, continue to have abdominal pain, with no nausea or vomiting, positive bowel movement.   Physical Exam: Vitals:   10/17/23 0834 10/17/23 1551 10/17/23 2034 10/18/23 0422  BP: 115/63 121/81 119/65 136/68  Pulse: 86 87 92 89  Resp:  16 18 16   Temp:  98.4 F (36.9 C) 98.3 F (36.8 C) 98.5  F (36.9 C)  TempSrc:  Oral Oral Oral  SpO2:  98% 95% 94%  Weight:      Height:       Neurology awake and alert, deconditioned  ENT with mild pallor Cardiovascular with S1 and S2 present, irregularly irregular with no gallops, rubs or murmurs Respiratory with no rales or wheezing, no rhonchi  Abdomen with no distention, soft but tender to deep  palpation at the lower quadrants, with no rebound or guarding.   No lower extremity edema   Data Reviewed:    Family Communication: no family at the bedside   Disposition: Status is: Observation The patient will require care spanning > 2 midnights and should be moved to inpatient because: IV antibiotics   Planned Discharge Destination: Home     Author: Albertus Alt, MD 10/18/2023 7:22 AM  For on call review [email or web address].

## 2023-10-18 NOTE — Care Management Obs Status (Signed)
 MEDICARE OBSERVATION STATUS NOTIFICATION   Patient Details  Name: Deborah Randall MRN: [id number] Date of Birth: 07-22-1940   Medicare Observation Status Notification Given:  Yes    Felix Host 10/18/2023, 2:44 PM

## 2023-10-18 NOTE — Plan of Care (Signed)
  Problem: Pain Managment: Goal: General experience of comfort will improve and/or be controlled Outcome: Progressing   Problem: Safety: Goal: Ability to remain free from injury will improve Outcome: Progressing

## 2023-10-18 NOTE — TOC CM/SW Note (Signed)
    Durable Medical Equipment  (From admission, onward)           Start     Ordered   10/18/23 1509  For home use only DME 4 wheeled rolling walker with seat  Once       Question:  Patient needs a walker to treat with the following condition  Answer:  Weakness   10/18/23 1509   10/18/23 1509  For home use only DME 3 n 1  Once        10/18/23 1509             Patient needs 3 in 1 / bedside commode due to being confined to a room with no bathroom

## 2023-10-18 NOTE — Evaluation (Signed)
 Occupational Therapy Evaluation Patient Details Name: Deborah Randall MRN: [id number] DOB: 1940-10-27 Today's Date: 10/18/2023   History of Present Illness   Pt is a 83 y.o. female presented to [facility] ED 5/27 c/o of RLQ pain and fever. CT of the abdomen pelvis showed acute diverticulitis PMH: HTN, afib, CKD, breast cancer     Clinical Impressions Pt admitted based on above, and was seen based on problem list below. PTA pt was living alone and was independent with ADLs and IADLs including driving. Today pt is requiring set up  to CGA for  ADLs. Bed mobility and functional transfers are  CGA. Pt completing functional mobility in the hallway with RW approx 169ft with CGA. Educated pt on use of DME in home to assist in ADLs. Pt reporting need for frequent rest breaks in home and out in the community. Educated pt on benefits of HH services to increase activity tolerance, strength, and balance. Pt would greatly benefit from [facility] to promote safe d/c to home. OT will continue to follow acutely to maximize functional independence.        If plan is discharge home, recommend the following:   A little help with walking and/or transfers;A little help with bathing/dressing/bathroom     Functional Status Assessment   Patient has had a recent decline in their functional status and demonstrates the ability to make significant improvements in function in a reasonable and predictable amount of time.     Equipment Recommendations   BSC/3in1     Recommendations for Other Services         Precautions/Restrictions   Precautions Precautions: Fall Recall of Precautions/Restrictions: Intact Restrictions Weight Bearing Restrictions Per Provider Order: No     Mobility Bed Mobility Overal bed mobility: Needs Assistance Bed Mobility: Supine to Sit, Sit to Supine     Supine to sit: Supervision, HOB elevated, Used rails Sit to supine: Supervision, HOB elevated, Used rails   General bed  mobility comments: No assist or use of bed features    Transfers Overall transfer level: Needs assistance Equipment used: Rolling walker (2 wheels) Transfers: Sit to/from Stand, Bed to chair/wheelchair/BSC Sit to Stand: Supervision     Step pivot transfers: Contact guard assist     General transfer comment: No assist, increased time      Balance Overall balance assessment: Needs assistance Sitting-balance support: No upper extremity supported, Feet supported Sitting balance-Leahy Scale: Fair Sitting balance - Comments: Slight posterior lean   Standing balance support: Bilateral upper extremity supported, During functional activity, Reliant on assistive device for balance Standing balance-Leahy Scale: Fair Standing balance comment: Benefits from use of RW       ADL either performed or assessed with clinical judgement   ADL Overall ADL's : Needs assistance/impaired Eating/Feeding: Set up;Sitting   Grooming: Set up;Sitting           Upper Body Dressing : Set up;Sitting   Lower Body Dressing: Contact guard assist;Sit to/from stand Lower Body Dressing Details (indicate cue type and reason): Benefits from 1 hand on walker to steady Toilet Transfer: Contact guard assist;Ambulation;Rolling walker (2 wheels) Toilet Transfer Details (indicate cue type and reason): Simulated in room Toileting- Clothing Manipulation and Hygiene: Contact guard assist;Sit to/from stand Toileting - Clothing Manipulation Details (indicate cue type and reason): Simulated in room     Functional mobility during ADLs: Contact guard assist;Rolling walker (2 wheels) General ADL Comments: Pt at CGA to supervision level, decreased activity tolerance     Vision Baseline Vision/History:  1 Wears glasses Vision Assessment?: No apparent visual deficits            Pertinent Vitals/Pain Pain Assessment Pain Assessment: No/denies pain     Extremity/Trunk Assessment Upper Extremity Assessment Upper  Extremity Assessment: Generalized weakness   Lower Extremity Assessment Lower Extremity Assessment: Defer to PT evaluation   Cervical / Trunk Assessment Cervical / Trunk Assessment: Kyphotic   Communication Communication Communication: No apparent difficulties   Cognition Arousal: Alert Behavior During Therapy: WFL for tasks assessed/performed Cognition: No apparent impairments     Following commands: Intact       Cueing  General Comments   Cueing Techniques: Verbal cues  Granddaughter present for session appears supportive           Home Living Family/patient expects to be discharged to:: Private residence Living Arrangements: Alone Available Help at Discharge: Family;Available PRN/intermittently (Son lives next door) Type of Home: House Home Access: Stairs to enter Secretary/administrator of Steps: 4 Entrance Stairs-Rails: Right;Left Home Layout: One level     Bathroom Shower/Tub: Producer, television/film/video: Standard Bathroom Accessibility: Yes How Accessible: Accessible via walker Home Equipment: Agricultural consultant (2 wheels);Cane - single point;Shower seat;Grab bars - tub/shower   Additional Comments: Family lives close by and able to assist as needed.      Prior Functioning/Environment Prior Level of Function : Independent/Modified Independent;Driving     Mobility Comments: cane at baseline ADLs Comments: Driving    OT Problem List: Decreased strength;Decreased activity tolerance;Impaired balance (sitting and/or standing);Decreased knowledge of use of DME or AE   OT Treatment/Interventions: Self-care/ADL training;Therapeutic exercise;Energy conservation;DME and/or AE instruction;Therapeutic activities;Balance training;Patient/family education      OT Goals(Current goals can be found in the care plan section)   Acute Rehab OT Goals Patient Stated Goal: To be independent OT Goal Formulation: With patient Time For Goal Achievement:  11/01/23 Potential to Achieve Goals: Good   OT Frequency:  Min 2X/week       AM-PAC OT "6 Clicks" Daily Activity     Outcome Measure Help from another person eating meals?: None Help from another person taking care of personal grooming?: A Little Help from another person toileting, which includes using toliet, bedpan, or urinal?: A Little Help from another person bathing (including washing, rinsing, drying)?: A Little Help from another person to put on and taking off regular upper body clothing?: A Little Help from another person to put on and taking off regular lower body clothing?: A Little 6 Click Score: 19   End of Session Equipment Utilized During Treatment: Gait belt;Rolling walker (2 wheels) Nurse Communication: Mobility status  Activity Tolerance: Patient limited by fatigue Patient left: in bed;with call bell/phone within reach;with family/visitor present  OT Visit Diagnosis: Unsteadiness on feet (R26.81);Muscle weakness (generalized) (M62.81)                Time: 1610-9604 OT Time Calculation (min): 17 min Charges:  OT General Charges $OT Visit: 1 Visit OT Evaluation $OT Eval Low Complexity: 1 Low  Delmer Ferraris, OT  Acute Rehabilitation Services Office [phone] Secure chat preferred   Mickael Alamo 10/18/2023, 2:11 PM

## 2023-10-18 NOTE — Progress Notes (Signed)
   10/18/23 1509  TOC Brief Assessment  Insurance and Status Reviewed  Patient has primary care physician Yes  Home environment has been reviewed home alone has family close by  Prior level of function: independent  Prior/Current Home Services No current home services  Social Drivers of Health Review SDOH reviewed no interventions necessary  Readmission risk has been reviewed Yes  Transition of care needs  (see note)     10/18/23 1509  TOC Brief Assessment  Insurance and Status Reviewed  Patient has primary care physician Yes  Home environment has been reviewed home alone has family close by  Prior level of function: independent  Prior/Current Home Services No current home services  Social Drivers of Health Review SDOH reviewed no interventions necessary  Readmission risk has been reviewed Yes  Transition of care needs  (see note)    Spoke to patient and family at bedside. PAtient home alone but has family close by if assistance needed. PT /OT recommending 3 in1 and Rolator. Discussed with patient and family all in agreement asked MD to sign orders . Ordered with [employer]

## 2023-10-18 NOTE — Evaluation (Signed)
 Physical Therapy Evaluation Patient Details Name: Deborah Randall MRN: [id number] DOB: [birth year] Today's Date: 10/18/2023  History of Present Illness  Pt is a 83 y.o. female presented to [facility] ED 5/27 c/o of RLQ pain and fever. CT of the abdomen pelvis showed acute diverticulitis PMH: HTN, afib, CKD, breast cancer  Clinical Impression  Pt admitted with above diagnosis. PTA pt lived at home alone, mod I mobility with cane, driving. Pt currently with functional limitations due to the deficits listed below (see PT Problem List). On eval, pt required supervision bed mobility, CGA transfers, and CGA amb 25' x 2 with RW. Pt declined hallway amb due to breakfast tray arrival. Pt will benefit from acute skilled PT to increase their independence and safety with mobility to allow discharge.  PT to follow acutely. Pt declining follow up services. She reports having good family support.          If plan is discharge home, recommend the following: Assistance with cooking/housework;Help with stairs or ramp for entrance;Assist for transportation   Can travel by private vehicle        Equipment Recommendations Rollator (4 wheels)  Recommendations for Other Services       Functional Status Assessment       Precautions / Restrictions Precautions Precautions: Fall Recall of Precautions/Restrictions: Intact      Mobility  Bed Mobility Overal bed mobility: Needs Assistance Bed Mobility: Supine to Sit, Sit to Supine     Supine to sit: Supervision, HOB elevated, Used rails Sit to supine: Supervision, HOB elevated, Used rails        Transfers Overall transfer level: Needs assistance Equipment used: Rolling walker (2 wheels) Transfers: Sit to/from Stand Sit to Stand: Contact guard assist           General transfer comment: no physical assist, increased time    Ambulation/Gait Ambulation/Gait assistance: Contact guard assist Gait Distance (Feet): 25 Feet (x 2) Assistive device: Rolling  walker (2 wheels) Gait Pattern/deviations: Step-to pattern, Decreased stride length Gait velocity: decreased Gait velocity interpretation: <1.31 ft/sec, indicative of household ambulator   General Gait Details: Gait limited to in room due to breakfast tray arrived and pt wanting to eat. Pt amb 31' with MT yesterday, 5/28.  Stairs            Wheelchair Mobility     Tilt Bed    Modified Rankin (Stroke Patients Only)       Balance Overall balance assessment: Needs assistance Sitting-balance support: No upper extremity supported, Feet supported Sitting balance-Leahy Scale: Fair     Standing balance support: Bilateral upper extremity supported, During functional activity, Reliant on assistive device for balance Standing balance-Leahy Scale: Poor                               Pertinent Vitals/Pain Pain Assessment Pain Assessment: No/denies pain    Home Living Family/patient expects to be discharged to:: Private residence Living Arrangements: Alone Available Help at Discharge: Family;Available PRN/intermittently Type of Home: House Home Access: Stairs to enter Entrance Stairs-Rails: Doctor, general practice of Steps: 4   Home Layout: One level Home Equipment: Agricultural consultant (2 wheels);Cane - single point;Shower seat Additional Comments: Family lives close by and able to assist as needed.    Prior Function Prior Level of Function : Independent/Modified Independent;Driving             Mobility Comments: cane at baseline  Extremity/Trunk Assessment   Upper Extremity Assessment Upper Extremity Assessment: Generalized weakness    Lower Extremity Assessment Lower Extremity Assessment: Generalized weakness    Cervical / Trunk Assessment Cervical / Trunk Assessment: Kyphotic  Communication   Communication Communication: No apparent difficulties    Cognition Arousal: Alert Behavior During Therapy: WFL for tasks  assessed/performed   PT - Cognitive impairments: No apparent impairments                         Following commands: Intact       Cueing Cueing Techniques: Verbal cues     General Comments      Exercises     Assessment/Plan    PT Assessment Patient needs continued PT services  PT Problem List Decreased strength;Decreased balance;Decreased mobility;Decreased activity tolerance       PT Treatment Interventions Functional mobility training;DME instruction;Balance training;Patient/family education;Therapeutic activities;Gait training;Therapeutic exercise;Stair training    PT Goals (Current goals can be found in the Care Plan section)  Acute Rehab PT Goals Patient Stated Goal: home PT Goal Formulation: With patient Time For Goal Achievement: 11/01/23 Potential to Achieve Goals: Good    Frequency Min 2X/week     Co-evaluation               AM-PAC PT "6 Clicks" Mobility  Outcome Measure Help needed turning from your back to your side while in a flat bed without using bedrails?: None Help needed moving from lying on your back to sitting on the side of a flat bed without using bedrails?: A Little Help needed moving to and from a bed to a chair (including a wheelchair)?: A Little Help needed standing up from a chair using your arms (e.g., wheelchair or bedside chair)?: A Little Help needed to walk in hospital room?: A Little Help needed climbing 3-5 steps with a railing? : A Little 6 Click Score: 19    End of Session Equipment Utilized During Treatment: Gait belt Activity Tolerance: Patient tolerated treatment well Patient left: in bed;with call bell/phone within reach Nurse Communication: Mobility status PT Visit Diagnosis: Muscle weakness (generalized) (M62.81)    Time: 9562-1308 PT Time Calculation (min) (ACUTE ONLY): 19 min   Charges:   PT Evaluation $PT Eval Low Complexity: 1 Low   PT General Charges $$ ACUTE PT VISIT: 1 Visit          Dorothye Gathers., PT  Office # [phone]   Guadelupe Leech 10/18/2023, 8:44 AM

## 2023-10-18 NOTE — Care Management Obs Status (Signed)
 MEDICARE OBSERVATION STATUS NOTIFICATION   Patient Details  Name: Deborah Randall MRN: [id number] Date of Birth: 04-22-1941   Medicare Observation Status Notification Given:  Yes    Felix Host 10/18/2023, 2:45 PM

## 2023-10-19 DIAGNOSIS — I4819 Other persistent atrial fibrillation: Secondary | ICD-10-CM | POA: Diagnosis not present

## 2023-10-19 DIAGNOSIS — E039 Hypothyroidism, unspecified: Secondary | ICD-10-CM | POA: Diagnosis not present

## 2023-10-19 DIAGNOSIS — N183 Chronic kidney disease, stage 3 unspecified: Secondary | ICD-10-CM | POA: Diagnosis not present

## 2023-10-19 DIAGNOSIS — K5792 Diverticulitis of intestine, part unspecified, without perforation or abscess without bleeding: Secondary | ICD-10-CM | POA: Diagnosis not present

## 2023-10-19 DIAGNOSIS — F411 Generalized anxiety disorder: Secondary | ICD-10-CM | POA: Diagnosis not present

## 2023-10-19 DIAGNOSIS — E7800 Pure hypercholesterolemia, unspecified: Secondary | ICD-10-CM | POA: Diagnosis not present

## 2023-10-19 DIAGNOSIS — I10 Essential (primary) hypertension: Secondary | ICD-10-CM | POA: Diagnosis not present

## 2023-10-19 DIAGNOSIS — I5032 Chronic diastolic (congestive) heart failure: Secondary | ICD-10-CM | POA: Diagnosis not present

## 2023-10-19 DIAGNOSIS — C50411 Malignant neoplasm of upper-outer quadrant of right female breast: Secondary | ICD-10-CM | POA: Diagnosis not present

## 2023-10-19 LAB — BASIC METABOLIC PANEL WITH GFR
Anion gap: 4 — ABNORMAL LOW (ref 5–15)
BUN: 12 mg/dL (ref 8–23)
CO2: 24 mmol/L (ref 22–32)
Calcium: 8.3 mg/dL — ABNORMAL LOW (ref 8.9–10.3)
Chloride: 111 mmol/L (ref 98–111)
Creatinine, Ser: 1.17 mg/dL — ABNORMAL HIGH (ref 0.44–1.00)
GFR, Estimated: 46 mL/min — ABNORMAL LOW (ref >60)
Glucose, Bld: 103 mg/dL — ABNORMAL HIGH (ref 70–99)
Potassium: 4.1 mmol/L (ref 3.5–5.1)
Sodium: 139 mmol/L (ref 135–145)

## 2023-10-19 LAB — CBC
HCT: 34 % — ABNORMAL LOW (ref 36.0–46.0)
Hemoglobin: 11 g/dL — ABNORMAL LOW (ref 12.0–15.0)
MCH: 30.2 pg (ref 26.0–34.0)
MCHC: 32.4 g/dL (ref 30.0–36.0)
MCV: 93.4 fL (ref 80.0–100.0)
Platelets: 103 10*3/uL — ABNORMAL LOW (ref 150–400)
RBC: 3.64 MIL/uL — ABNORMAL LOW (ref 3.87–5.11)
RDW: 13.2 % (ref 11.5–15.5)
WBC: 7.1 10*3/uL (ref 4.0–10.5)
nRBC: 0 % (ref 0.0–0.2)

## 2023-10-19 LAB — MAGNESIUM: Magnesium: 1.8 mg/dL (ref 1.7–2.4)

## 2023-10-19 MED ORDER — AMOXICILLIN-POT CLAVULANATE 875-125 MG PO TABS
1.0000 | ORAL_TABLET | Freq: Two times a day (BID) | ORAL | Status: DC
  Administered 2023-10-19: 1 via ORAL
  Filled 2023-10-19: qty 1

## 2023-10-19 MED ORDER — AMOXICILLIN-POT CLAVULANATE 875-125 MG PO TABS
1.0000 | ORAL_TABLET | Freq: Two times a day (BID) | ORAL | 0 refills | Status: AC
Start: 2023-10-19 — End: 2023-10-24

## 2023-10-19 MED ORDER — GABAPENTIN 100 MG PO CAPS: 100.0000 mg | ORAL_CAPSULE | Freq: Every day | ORAL | Status: DC

## 2023-10-19 MED ORDER — POTASSIUM CHLORIDE CRYS ER 20 MEQ PO TBCR
40.0000 meq | EXTENDED_RELEASE_TABLET | ORAL | Status: DC
  Administered 2023-10-19: 40 meq via ORAL
  Filled 2023-10-19: qty 2

## 2023-10-19 NOTE — Plan of Care (Signed)
   Problem: Health Behavior/Discharge Planning: Goal: Ability to manage health-related needs will improve Outcome: Progressing   Problem: Clinical Measurements: Goal: Ability to maintain clinical measurements within normal limits will improve Outcome: Progressing   Problem: Clinical Measurements: Goal: Will remain free from infection Outcome: Progressing

## 2023-10-19 NOTE — TOC Transition Note (Addendum)
 Transition of Care [facility]) - Discharge Note   Patient Details  Name: Deborah Randall MRN: [id number] Date of Birth: 1940/09/14  Transition of Care [facility]) CM/SW Contact:  Ronni Colace, RN Phone Number: 10/19/2023, 12:16 PM   Clinical Narrative:     Patient is discharging today with family support. A walker and BSC was ordered yesterday through [employer], they were calling granddaughter for payment then can deliver.  May go to lounge to wait for DME 1320 called [employer] they have dispatched orders cannot give a timeframe- told them the patient will be in the room for a few minutes then is going to DC lounge to wait.   Final next level of care: Home/Self Care Barriers to Discharge: Equipment Delay   Patient Goals and CMS Choice    Home with DME        Discharge Placement                       Discharge Plan and Services Additional resources added to the After Visit Summary for                  DME Arranged: Otho Blitz, Bedside commode DME Agency: [employer] Date DME Agency Contacted: 10/18/23                Social Drivers of Health (SDOH) Interventions SDOH Screenings   Food Insecurity: No Food Insecurity (10/16/2023)  Housing: Unknown (10/16/2023)  Transportation Needs: No Transportation Needs (10/16/2023)  Utilities: Not At Risk (10/16/2023)  Depression (PHQ2-9): Low Risk  (09/06/2023)  Financial Resource Strain: Low Risk  (05/16/2017)  Physical Activity: Inactive (05/16/2017)  Social Connections: Moderately Integrated (10/16/2023)  Stress: No Stress Concern Present (05/16/2017)  Tobacco Use: Low Risk  (10/16/2023)     Readmission Risk Interventions     No data to display

## 2023-10-19 NOTE — Discharge Summary (Signed)
 Physician Discharge Summary   Patient: Deborah Randall MRN: [id number] DOB: [birth year]  Admit date:     10/16/2023  Discharge date: 10/19/23  Discharge Physician: Curlee Doss Madex Seals   PCP: Estil Heman, NP   Recommendations at discharge:    Patient will continue antibiotic therapy with Augmentin  for 5 more days to complete 7 days course.  Follow up renal function and electrolytes in 7 days as outpatient.  Follow up with Dinah Ngetich NP in 7 to 10 days. Continue home PT [facility].   I spoke with patient's granddaughter over the phone, we talked in detail about patient's condition, plan of care and all questions were addressed.   Discharge Diagnoses: Principal Problem:   Acute diverticulitis Active Problems:   Chronic congestive heart failure, unspecified heart failure type (HCC)   Chronic renal insufficiency, stage III (moderate) (HCC)   Essential hypertension, benign   Hypercholesterolemia   Generalized anxiety disorder   Hypothyroidism   Persistent atrial fibrillation (HCC)   Malignant neoplasm of upper-outer quadrant of right breast in female, estrogen receptor positive (HCC)  Resolved Problems:   * No resolved hospital problems. [facility] Course: Deborah Randall was admitted to the hospital with the working diagnosis of acute diverticulitis   83 yo female with the past medical history of hypertension, hyperlipidemia, atrial fibrillation, and heart failure who presented with abdominal pain, nausea and vomiting. At home she was found to be febrile. Reported 2 to 3 days of right lower quadrant abdominal pain. On her initial physical examination her blood pressure was 71/44, 107/62, HR 82, RR 25 and 02 saturation 99% Lungs with no wheezing or rales, heart with S1 and S2 present, irregularly irregular with no gallops or rubs, abdomen tender to palpation, with no rebound or guarding, no lower extremity edema.   Na 140, K 2.8, Cl 103 bicarbonate 23, glucose 94 bun  16 cr 1,52  AST 17 ALT 8  Lactic acid 2,4 and 1,3  Wbc 16,7 hgb 14.0 plt 103   Urine analysis SG 1,045, negative protein, negative hgb, trace leukocytes, 6-10 wbc.   Chest radiograph with hypoinflation, positive cardiomegaly, with no effusions or infiltrates.   EKG 80 bpm, left axis deviation, normal intervals, qtc 412, atrial fibrillation rhythm with no significant ST segment changes, negative T lead II, III, aVF, V1 to V3.   CT abdomen and pelvis with acute sigmoid colonic diverticulitis. No evidence of perforation or abscess.  Similar moderate to large hiatal hernia.   Patient was placed on supportive medical therapy and antibiotic therapy with improvement in her symptoms.  Diet was advanced with good toleration.  05/30 patient stable for discharge home today and follow up as outpatient with primary care.   Assessment and Plan: * Acute diverticulitis Patient was placed on supportive medical therapy, with improvement in her symptoms.   She received short course of IV fluids and her diet was advanced with good toleration.  Antibiotic therapy with IV ceftriaxone and PO metronidazole, that will be transitioned to oral Augmentin  for 4 more days to complete 7 days.  As needed analgesics and antiemetics.  Chronic diastolic CHF (congestive heart failure) (HCC) Echocardiogram with preserved LV systolic function with EF 55 to 60%, RV systolic function lowe normal, RVSP 27.0 mmHg, LA and RA with moderate dilatation, mild to moderate tricuspid regurgitation,   No signs of volume overload.  Tolerated well IV fluids with no signs of volume overload. Continue with metoprolol .   Chronic renal insufficiency, stage  III (moderate) (HCC) CKD stage 3a, basal serum cr 1,2 (no AKI)  hypokalemia.   Potassium was corrected with oral Kcl.  At the time of discharge her renal function has a serum cr of 1,1 with K at 4.1 and serum bicarbonate at 24 Na 139 Mg 1,8   Patient is tolerating po well, will  follow up renal function and electrolytes as outpatient.    Essential hypertension, benign Continue blood pressure control with metoprolol .   Persistent atrial fibrillation (HCC) Continue rate control with metoprolol  and anticoagulation with apixaban .   Generalized anxiety disorder Continue with alprazolam  and sertraline .   Hypothyroidism Continue levothyroxine    Hypercholesterolemia Continue statin therapy   Malignant neoplasm of upper-outer quadrant of right breast in female, estrogen receptor positive (HCC) Follow up as outpatient.  Continue with tamoxifen .       Consultants: none  Procedures performed: none   Disposition: Home Diet recommendation:  Cardiac diet DISCHARGE MEDICATION: Allergies as of 10/19/2023   No Known Allergies      Medication List     TAKE these medications    acetaminophen  325 MG tablet Commonly known as: TYLENOL  Take 2 tablets (650 mg total) by mouth every 4 (four) hours as needed for headache or mild pain.   albuterol  108 (90 Base) MCG/ACT inhaler Commonly known as: VENTOLIN  HFA Inhale 1 puff into the lungs every 6 (six) hours as needed for shortness of breath.   ALPRAZolam  0.5 MG tablet Commonly known as: XANAX  TAKE ONE TABLET BY MOUTH DAILY FOR ANXIETY What changed:  how much to take how to take this when to take this reasons to take this   amoxicillin -clavulanate 875-125 MG tablet Commonly known as: AUGMENTIN  Take 1 tablet by mouth every 12 (twelve) hours for 5 days.   calcium  citrate 950 (200 Ca) MG tablet Commonly known as: CALCITRATE - dosed in mg elemental calcium  Take 200 mg of elemental calcium  by mouth daily.   diphenhydrAMINE  25 mg capsule Commonly known as: BENADRYL  Take 25 mg by mouth every 8 (eight) hours as needed for allergies.   docusate sodium  100 MG capsule Commonly known as: Colace Take 1 capsule (100 mg total) by mouth daily.   Eliquis  5 MG Tabs tablet Generic drug: apixaban  TAKE 1 TABLET BY  MOUTH TWICE DAILY   furosemide  40 MG tablet Commonly known as: LASIX  TAKE 1 TABLET BY MOUTH DAILY   gabapentin  100 MG capsule Commonly known as: NEURONTIN  Take 1 capsule (100 mg total) by mouth at bedtime.   ibuprofen 200 MG tablet Commonly known as: ADVIL Take 200 mg by mouth daily as needed for headache or mild pain.   Iron  (Ferrous Sulfate ) 325 (65 Fe) MG Tabs Take 325 mg by mouth daily.   levothyroxine  137 MCG tablet Commonly known as: SYNTHROID  TAKE 1 TABLET BY MOUTH DAILY   meclizine  12.5 MG tablet Commonly known as: ANTIVERT  Take 12.5 mg by mouth as needed for dizziness.   metoprolol  tartrate 25 MG tablet Commonly known as: LOPRESSOR  Take 1 tablet (25 mg total) by mouth 2 (two) times daily.   potassium chloride  SA 20 MEQ tablet Commonly known as: KLOR-CON  M TAKE 2 TABLETS BY MOUTH DAILY   rosuvastatin  10 MG tablet Commonly known as: CRESTOR  TAKE 1 TABLET BY MOUTH DAILY   sertraline  25 MG tablet Commonly known as: ZOLOFT  TAKE 2 TABLETS BY MOUTH DAILY   tamoxifen  20 MG tablet Commonly known as: NOLVADEX  TAKE 1 TABLET BY MOUTH DAILY   Vitamin D3 50 MCG (2000 UT)  Tabs Take 2,000 Units by mouth daily.               Durable Medical Equipment  (From admission, onward)           Start     Ordered   10/18/23 1509  For home use only DME 4 wheeled rolling walker with seat  Once       Question:  Patient needs a walker to treat with the following condition  Answer:  Weakness   10/18/23 1509   10/18/23 1509  For home use only DME 3 n 1  Once        10/18/23 1509            Discharge Exam: Filed Weights   10/16/23 1708 10/17/23 0428 10/19/23 0422  Weight: 57.5 kg (P) 63.1 kg 64.7 kg   BP 119/81 (BP Location: Right Leg)   Pulse 83   Temp 98.7 F (37.1 C) (Oral)   Resp 16   Ht 5\' 4"  (1.626 m)   Wt 64.7 kg   SpO2 95%   BMI 24.48 kg/m   Patient is feeling better, her abdominal pain has improved, no nausea or vomiting, positive bowel  movements. Tolerating po well.   Neurology awake and alert ENT with mild pallor with no icterus Cardiovascular with S1 anda S2 present, irregularly irregular with no gallops, rubs or murmurs Respiratory with no rales or wheezing, no rhonchi  Abdomen soft and non tender, not distended.  No trace non pitting bilateral lower extremity edema   Condition at discharge: stable  The results of significant diagnostics from this hospitalization (including imaging, microbiology, ancillary and laboratory) are listed below for reference.   Imaging Studies: CT ABDOMEN PELVIS W CONTRAST Result Date: 10/16/2023 CLINICAL DATA:  Abdominal pain, acute, nonlocalized EXAM: CT ABDOMEN AND PELVIS WITH CONTRAST TECHNIQUE: Multidetector CT imaging of the abdomen and pelvis was performed using the standard protocol following bolus administration of intravenous contrast. RADIATION DOSE REDUCTION: This exam was performed according to the departmental dose-optimization program which includes automated exposure control, adjustment of the mA and/or kV according to patient size and/or use of iterative reconstruction technique. CONTRAST:  60mL OMNIPAQUE  IOHEXOL  350 MG/ML SOLN COMPARISON:  CT abdomen/pelvis dated 06/06/2022. FINDINGS: Lower chest: Similar bibasilar subpleural reticular changes with possible mild honeycombing. Hepatobiliary: No focal liver abnormality is seen. No gallstones, gallbladder wall thickening, or biliary dilatation. Pancreas: Unremarkable. No pancreatic ductal dilatation or surrounding inflammatory changes. Spleen: Normal in size without focal abnormality. Adrenals/Urinary Tract: Adrenal glands are unremarkable. Kidneys are normal, without renal calculi, focal lesion, or hydronephrosis. Bladder is unremarkable. Stomach/Bowel: Extensive sigmoid colonic diverticulosis with mid to distal sigmoid colonic wall thickening, submucosal edema, pericolonic fat stranding, and trace fluid, most compatible with acute  diverticulitis. No evidence of perforation or abscess. Diffuse colonic diverticulosis. Similar moderate-to-large hiatal hernia. Small bowel is grossly unremarkable. No evidence of obstruction. Vascular/Lymphatic: Aortic atherosclerosis. No enlarged abdominal or pelvic lymph nodes. Reproductive: Status post hysterectomy. No adnexal masses. Other: No intraperitoneal free air. Tiny fat containing umbilical hernia. Musculoskeletal: Diffuse osseous demineralization. Status post left hip arthroplasty. Stable mild L3 endplate compression deformity. No acute osseous abnormality. IMPRESSION: 1. Acute sigmoid colonic diverticulitis. No evidence of perforation or abscess. 2. Similar moderate-to-large hiatal hernia. 3. Aortic Atherosclerosis (ICD10-I70.0). Electronically Signed   By: Mannie Seek M.D.   On: 10/16/2023 15:03   DG Chest Port 1 View Result Date: 10/16/2023 CLINICAL DATA:  Questionable sepsis - evaluate for abnormality EXAM: PORTABLE CHEST 1  VIEW COMPARISON:  01/14/2021. FINDINGS: Stable mild cardiomegaly. Aortic atherosclerosis. Low lung volumes. No focal consolidation, sizeable pleural effusion, or pneumothorax. Surgical clips in the right axilla. No acute osseous abnormality. IMPRESSION: 1. No acute cardiopulmonary findings. 2. Stable mild cardiomegaly. Electronically Signed   By: Mannie Seek M.D.   On: 10/16/2023 14:53   ECHOCARDIOGRAM COMPLETE Result Date: 10/02/2023    ECHOCARDIOGRAM REPORT   Patient Name:   Deborah Randall     Date of Exam: 10/02/2023 Medical Rec #:  [id number]       Height:       64.0 in Accession #:    [id number]      Weight:       132.8 lb Date of Birth:  [birth year]       BSA:          1.644 m Patient Age:    82 years        BP:           108/70 mmHg Patient Gender: F               HR:           70 bpm. Exam Location:  [address] Procedure: 2D Echo, 3D Echo, Cardiac Doppler and Color Doppler (Both Spectral            and Color Flow Doppler were utilized during procedure).  Indications:    R07.2 Precordial pain; R06.02 SOB; R60.0 Lower extremity edema;                 I48.2 Chronic atrial fibrillation  History:        Patient has prior history of Echocardiogram examinations, most                 recent 04/18/2023. CHF, Arrythmias:Atrial Fibrillation,                 Signs/Symptoms:Chest Pain, Shortness of Breath and Edema; Risk                 Factors:Hypertension, CKD stage 3, Dyslipidemia and Non-Smoker.                 Patient complains of new chest pain, increasing SOB and mild leg                 edema. History of bilateral breast cancer with mastectomies.  Sonographer:    Richarda Chance RVT, RDCS (AE), RDMS Referring Phys: [phone] [facility]  Sonographer Comments: Image acquisition challenging due to mastectomy. Global longitudinal strain was attempted. IMPRESSIONS  1. Left ventricular ejection fraction, by estimation, is 55 to 60%. Left ventricular ejection fraction by 3D volume is 50 %. The left ventricle has normal function. The left ventricle has no regional wall motion abnormalities. Left ventricular diastolic  function could not be evaluated.  2. Right ventricular systolic function is low normal. The right ventricular size is normal. There is normal pulmonary artery systolic pressure. The estimated right ventricular systolic pressure is 27.0 mmHg.  3. Left atrial size was moderately dilated.  4. Right atrial size was moderately dilated.  5. The mitral valve is normal in structure. Mild mitral valve regurgitation.  6. Tricuspid valve regurgitation is mild to moderate.  7. The aortic valve is tricuspid. Aortic valve regurgitation is not visualized. No aortic stenosis is present.  8. There is mild (Grade II) protruding plaque involving the ascending aorta. Comparison(s): EF 55%, moderate TR. FINDINGS  Left Ventricle: Left ventricular ejection fraction,  by estimation, is 55 to 60%. Left ventricular ejection fraction by 3D volume is 50 %. The left ventricle has normal function.  The left ventricle has no regional wall motion abnormalities. The left ventricular internal cavity size was normal in size. There is no left ventricular hypertrophy. Abnormal (paradoxical) septal motion, consistent with left bundle branch block. Left ventricular diastolic function could not be evaluated due to atrial fibrillation. Left ventricular diastolic function could not be evaluated. Right Ventricle: The right ventricular size is normal. No increase in right ventricular wall thickness. Right ventricular systolic function is low normal. There is normal pulmonary artery systolic pressure. The tricuspid regurgitant velocity is 2.45 m/s,  and with an assumed right atrial pressure of 3 mmHg, the estimated right ventricular systolic pressure is 27.0 mmHg. Left Atrium: Left atrial size was moderately dilated. Right Atrium: Right atrial size was moderately dilated. Pericardium: There is no evidence of pericardial effusion. Mitral Valve: The mitral valve is normal in structure. Mild mitral valve regurgitation, with eccentric posteriorly directed jet. Tricuspid Valve: The tricuspid valve is normal in structure. Tricuspid valve regurgitation is mild to moderate. Aortic Valve: The aortic valve is tricuspid. Aortic valve regurgitation is not visualized. No aortic stenosis is present. Aortic valve mean gradient measures 3.0 mmHg. Aortic valve peak gradient measures 4.4 mmHg. Aortic valve area, by VTI measures 1.85 cm. Pulmonic Valve: The pulmonic valve was normal in structure. Pulmonic valve regurgitation is trivial. No evidence of pulmonic stenosis. Aorta: The aortic root and ascending aorta are structurally normal, with no evidence of dilitation. There is mild (Grade II) protruding plaque involving the ascending aorta. IAS/Shunts: No atrial level shunt detected by color flow Doppler. Additional Comments: 3D was performed not requiring image post processing on an independent workstation and was abnormal.  LEFT VENTRICLE  PLAX 2D LVIDd:         3.66 cm         Diastology LVIDs:         2.64 cm         LV e' medial:    5.12 cm/s LV PW:         0.74 cm         LV E/e' medial:  25.2 LV IVS:        0.95 cm         LV e' lateral:   6.30 cm/s LVOT diam:     1.96 cm         LV E/e' lateral: 20.5 LV SV:         45 LV SV Index:   27 LVOT Area:     3.02 cm        3D Volume EF                                LV 3D EF:    Left                                             ventricul                                             ar  ejection                                             fraction                                             by 3D                                             volume is                                             50 %.                                 3D Volume EF:                                3D EF:        50 %                                LV EDV:       74 ml                                LV ESV:       37 ml                                LV SV:        37 ml RIGHT VENTRICLE RV S prime:     10.80 cm/s TAPSE (M-mode): 1.5 cm LEFT ATRIUM           Index        RIGHT ATRIUM           Index LA diam:      3.67 cm 2.23 cm/m   RA Area:     22.70 cm LA Vol (A2C): 67.4 ml 41.00 ml/m  RA Volume:   70.55 ml  42.92 ml/m  AORTIC VALVE                    PULMONIC VALVE AV Area (Vmax):    2.16 cm     PV Vmax:       0.76 m/s AV Area (Vmean):   1.77 cm     PV Peak grad:  2.3 mmHg AV Area (VTI):     1.85 cm AV Vmax:           105.00 cm/s AV Vmean:          79.600 cm/s AV VTI:            0.241 m AV Peak Grad:      4.4 mmHg AV Mean Grad:  3.0 mmHg LVOT Vmax:         75.00 cm/s LVOT Vmean:        46.600 cm/s LVOT VTI:          0.148 m LVOT/AV VTI ratio: 0.61  AORTA Ao Root diam: 2.95 cm Ao Asc diam:  3.10 cm Ao Arch diam: 2.8 cm MITRAL VALVE                TRICUSPID VALVE MV Area (PHT): 4.68 cm     TR Peak grad:   24.0 mmHg MV Decel Time: 162 msec     TR Vmax:        245.00 cm/s MV E  velocity: 129.00 cm/s                             SHUNTS                             Systemic VTI:  0.15 m                             Systemic Diam: 1.96 cm Karyl Paget Croitoru MD Electronically signed by Luana Rumple MD Signature Date/Time: 10/02/2023/9:29:42 AM    Final     Microbiology: Results for orders placed or performed during the hospital encounter of 10/16/23  Blood Culture (routine x 2)     Status: None (Preliminary result)   Collection Time: 10/16/23 11:32 AM   Specimen: BLOOD LEFT ARM  Result Value Ref Range Status   Specimen Description BLOOD LEFT ARM  Final   Special Requests   Final    BOTTLES DRAWN AEROBIC AND ANAEROBIC Blood Culture adequate volume   Culture   Final    NO GROWTH 3 DAYS Performed at [facility], [address], [place], [place] [address]    Report Status PENDING  Incomplete  Blood Culture (routine x 2)     Status: None (Preliminary result)   Collection Time: 10/16/23 11:32 AM   Specimen: BLOOD LEFT ARM  Result Value Ref Range Status   Specimen Description BLOOD LEFT ARM  Final   Special Requests   Final    BOTTLES DRAWN AEROBIC AND ANAEROBIC Blood Culture adequate volume   Culture   Final    NO GROWTH 3 DAYS Performed at [facility], [address] [place], [place] [address]    Report Status PENDING  Incomplete    Labs: CBC: Recent Labs  Lab 10/16/23 1045 10/16/23 1118 10/17/23 0517 10/18/23 0658 10/19/23 0603  WBC 16.7*  --  11.8* 10.7* 7.1  NEUTROABS 12.9*  --   --   --   --   HGB 14.0 14.6 11.4* 11.1* 11.0*  HCT 45.0 43.0 34.6* 33.3* 34.0*  MCV 97.8  --  92.8 92.5 93.4  PLT 103*  --  89* 100* 103*   Basic Metabolic Panel: Recent Labs  Lab 10/16/23 1045 10/16/23 1118 10/16/23 1651 10/16/23 2209 10/17/23 0517 10/18/23 0658 10/19/23 0603  NA 140 140  --  137 136 141 139  K 2.8* 2.7*  --  3.8 3.5 3.4* 4.1  CL 103 100  --  105 107 106 111  CO2 25  --   --  25 21* 24 24  GLUCOSE 94 94  --  79 74 119* 103*  BUN 16  19  --  15 14 11 12   CREATININE 1.52* 1.50*  --  1.32* 1.34* 1.18* 1.17*  CALCIUM  8.5*  --   --  8.1* 7.8* 8.1* 8.3*  MG  --   --  1.8  --   --   --  1.8   Liver Function Tests: Recent Labs  Lab 10/16/23 1045 10/17/23 0517  AST 17 14*  ALT 8 8  ALKPHOS 48 38  BILITOT 1.8* 1.4*  PROT 6.3* 4.9*  ALBUMIN 3.1* 2.4*   CBG: No results for input(s): "GLUCAP" in the last 168 hours.  Discharge time spent: greater than 30 minutes.  Signed: Albertus Alt, MD [facility] 10/19/2023

## 2023-10-19 NOTE — Plan of Care (Signed)

## 2023-10-19 NOTE — Progress Notes (Signed)
 Discharge instructions reviewed with pt and her son.  Copy of instructions given to pt. Pt informed her script was sent to her pharmacy for pick up. Pt getting dressed at this time. (PT has entered the room and may work with her before she is discharged.) pt's son is at the bedside and will be taking her home.  Pt will be d/c'd via wheelchair with belongings and will be escorted by hospital volunteer or staff.   Chandelle Harkey,RN SWOT

## 2023-10-21 LAB — CULTURE, BLOOD (ROUTINE X 2)
Culture: NO GROWTH
Culture: NO GROWTH
Special Requests: ADEQUATE
Special Requests: ADEQUATE

## 2023-10-29 ENCOUNTER — Ambulatory Visit (INDEPENDENT_AMBULATORY_CARE_PROVIDER_SITE_OTHER): Payer: Medicare PPO | Admitting: Family

## 2023-10-29 ENCOUNTER — Encounter: Payer: Self-pay | Admitting: Family

## 2023-10-29 VITALS — BP 116/70 | HR 99 | Temp 97.7°F | Resp 18 | Ht 64.0 in | Wt 127.4 lb

## 2023-10-29 DIAGNOSIS — K5732 Diverticulitis of large intestine without perforation or abscess without bleeding: Secondary | ICD-10-CM | POA: Diagnosis not present

## 2023-10-29 DIAGNOSIS — N1831 Chronic kidney disease, stage 3a: Secondary | ICD-10-CM

## 2023-10-29 DIAGNOSIS — Z853 Personal history of malignant neoplasm of breast: Secondary | ICD-10-CM

## 2023-10-29 DIAGNOSIS — I10 Essential (primary) hypertension: Secondary | ICD-10-CM

## 2023-10-29 DIAGNOSIS — F3341 Major depressive disorder, recurrent, in partial remission: Secondary | ICD-10-CM | POA: Diagnosis not present

## 2023-10-29 DIAGNOSIS — E039 Hypothyroidism, unspecified: Secondary | ICD-10-CM

## 2023-10-29 DIAGNOSIS — I509 Heart failure, unspecified: Secondary | ICD-10-CM | POA: Insufficient documentation

## 2023-10-29 DIAGNOSIS — I4819 Other persistent atrial fibrillation: Secondary | ICD-10-CM | POA: Diagnosis not present

## 2023-10-29 DIAGNOSIS — E7800 Pure hypercholesterolemia, unspecified: Secondary | ICD-10-CM | POA: Diagnosis not present

## 2023-10-29 NOTE — Progress Notes (Signed)
 Provider: Christean Courts FNP-C   Ronna Herskowitz, Elijio Guadeloupe, NP  Patient Care Team: Maanasa Aderhold, Elijio Guadeloupe, NP as PCP - General (Family Medicine) Croitoru, Karyl Paget, MD as PCP - Cardiology (Cardiology) [place], Amy, MD as Consulting Physician (Dermatology) Duke, Warren Haber, [place] as Physician Assistant (Cardiology) Murleen Arms, MD as Consulting Physician (Hematology and Oncology) Lockie Rima, MD as Consulting Physician (General Surgery)  Extended Emergency Contact Information Primary Emergency Contact: Dattilio,[name] Address: [phone]          [place] [address] [place] Home Phone: [phone] Mobile Phone: [phone] Relation: Son Secondary Emergency Contact: [name],[name]  [place] Phone: [phone] Relation: Granddaughter  Code Status:  Full Code  Goals of care: Advanced Directive information    10/29/2023   10:33 AM  Advanced Directives  Does Patient Have a Medical Advance Directive? Yes  Type of Advance Directive Living will  Does patient want to make changes to medical advance directive? No - Patient declined     Chief Complaint  Patient presents with   Medical Management of Chronic Issues    6 month follow up.    Discussed the use of AI scribe software for clinical note transcription with the patient, who gave verbal consent to proceed.  History of Present Illness   SONNY POTH is an 83 year old female with diverticulitis and chronic diastolic congestive heart failure who presents for follow-up after a recent hospitalization.  She was hospitalized on May 30th for acute sigmoid colon diverticulitis, presenting with abdominal pain, nausea, and vomiting. She was treated with IV antibiotics, including Rosadan and Flagyl , and discharged with a prescription for seven days of Augmentin , which she completed. She feels weak but has no ongoing abdominal pain, nausea, or vomiting. She experienced diarrhea following antibiotic treatment  and has not been taking probiotics.  During her hospitalization, a chest x-ray revealed an enlarged heart. She has a history of chronic diastolic congestive heart failure. An echocardiogram showed an ejection fraction of 55-60%. She has no leg swelling, shortness of breath, or cough. An EKG indicated an irregular heartbeat.  She has chronic kidney disease, stage three. Her potassium levels were low during her hospital stay, and she received potassium supplementation. Her blood pressure is well-controlled, and she continues to take Xanax  and sertraline  for anxiety. Her thyroid  levels are stable, and she is on cholesterol medication.  She has a history of right breast cancer and is currently on tamoxifen . She reports a decreased appetite and difficulty eating large meals, often only able to consume half of a banana sandwich. She notes feeling particularly weak yesterday, which was the worst day since returning home.  She lives independently with her husband nearby, who assists with cooking and cleaning. She avoids foods with seeds, such as strawberries, peanuts, cucumbers, popcorn, and cabbage, due to her diverticulitis. She enjoys yogurt and is considering incorporating Greek yogurt into her diet for its probiotic benefits.   Past Medical History:  Diagnosis Date   Anxiety    Atrial fibrillation with rapid ventricular response (HCC) 07/07/2015   Bilateral breast cancer (HCC)    Daily headache    Depression    Diverticulitis    GERD (gastroesophageal reflux disease)    Hyperlipidemia    Hypertension    Hypothyroidism    Iron  deficiency anemia 2000s   Lumbago    Nausea alone    PONV (postoperative nausea and vomiting)    Senile osteoporosis    Squamous cell carcinoma of  forehead    "I think they burned it off"   Past Surgical History:  Procedure Laterality Date   ABDOMINAL HYSTERECTOMY  1970s   BREAST BIOPSY Bilateral    BREAST BIOPSY Right 05/18/2022   US  RT BREAST BX W LOC DEV 1ST  LESION IMG BX SPEC US  GUIDE 05/18/2022 [facility]   BREAST LUMPECTOMY Right 2002   "w/lymph nodes removed"   CARDIOVERSION N/A 08/19/2015   Procedure: CARDIOVERSION;  Surgeon: Luana Rumple, MD;  Location: [facility];  Service: Cardiovascular;  Laterality: N/A;   CATARACT EXTRACTION W/ INTRAOCULAR LENS  IMPLANT, BILATERAL Bilateral 2000s   DILATION AND CURETTAGE OF UTERUS  ~ 1968   ELBOW FRACTURE SURGERY Left 2011   "has 2 screws in it"   EXCISIONAL HEMORRHOIDECTOMY  ~ 1970   FRACTURE SURGERY  02/12/2016   Right Wrist   HEMIARTHROPLASTY HIP Left 2011   "had ball replaced"   INGUINAL HERNIA REPAIR Right 1980s?   MASTECTOMY Left 2005   MASTECTOMY Right 06/2022   ORIF WRIST FRACTURE Left 02/12/2016   Procedure: OPEN REDUCTION INTERNAL FIXATION LEFT DISTAL RADIUS;  Surgeon: Mauricia South, MD;  Location: [facility];  Service: Plastics;  Laterality: Left;   SIMPLE MASTECTOMY WITH AXILLARY SENTINEL NODE BIOPSY Right 06/22/2022   Procedure: RIGHT SIMPLE MASTECTOMY;  Surgeon: Lockie Rima, MD;  Location: [facility];  Service: General;  Laterality: Right;   TOTAL THYROIDECTOMY  1972   WRIST FRACTURE SURGERY Right    "has a plate and 7 screws in there"    No Known Allergies  Allergies as of 10/29/2023   No Known Allergies      Medication List        Accurate as of October 29, 2023 11:22 AM. If you have any questions, ask your nurse or doctor.          acetaminophen  325 MG tablet Commonly known as: TYLENOL  Take 2 tablets (650 mg total) by mouth every 4 (four) hours as needed for headache or mild pain.   albuterol  108 (90 Base) MCG/ACT inhaler Commonly known as: VENTOLIN  HFA Inhale 1 puff into the lungs every 6 (six) hours as needed for shortness of breath.   ALPRAZolam  0.5 MG tablet Commonly known as: XANAX  TAKE ONE TABLET BY MOUTH DAILY FOR ANXIETY What changed:  how much to take how to take this when to take this reasons to take this   calcium  citrate 950 (200 Ca) MG  tablet Commonly known as: CALCITRATE - dosed in mg elemental calcium  Take 200 mg of elemental calcium  by mouth daily.   diphenhydrAMINE  25 mg capsule Commonly known as: BENADRYL  Take 25 mg by mouth every 8 (eight) hours as needed for allergies.   docusate sodium  100 MG capsule Commonly known as: Colace Take 1 capsule (100 mg total) by mouth daily.   Eliquis  5 MG Tabs tablet Generic drug: apixaban  TAKE 1 TABLET BY MOUTH TWICE DAILY   furosemide  40 MG tablet Commonly known as: LASIX  TAKE 1 TABLET BY MOUTH DAILY   gabapentin  100 MG capsule Commonly known as: NEURONTIN  Take 1 capsule (100 mg total) by mouth at bedtime.   ibuprofen 200 MG tablet Commonly known as: ADVIL Take 200 mg by mouth daily as needed for headache or mild pain.   Iron  (Ferrous Sulfate ) 325 (65 Fe) MG Tabs Take 325 mg by mouth daily.   levothyroxine  137 MCG tablet Commonly known as: SYNTHROID  TAKE 1 TABLET BY MOUTH DAILY   meclizine  12.5 MG tablet Commonly known as: ANTIVERT   Take 12.5 mg by mouth as needed for dizziness.   metoprolol  tartrate 25 MG tablet Commonly known as: LOPRESSOR  Take 1 tablet (25 mg total) by mouth 2 (two) times daily.   potassium chloride  SA 20 MEQ tablet Commonly known as: KLOR-CON  M TAKE 2 TABLETS BY MOUTH DAILY   rosuvastatin  10 MG tablet Commonly known as: CRESTOR  TAKE 1 TABLET BY MOUTH DAILY   sertraline  25 MG tablet Commonly known as: ZOLOFT  TAKE 2 TABLETS BY MOUTH DAILY   tamoxifen  20 MG tablet Commonly known as: NOLVADEX  TAKE 1 TABLET BY MOUTH DAILY   Vitamin D3 50 MCG (2000 UT) Tabs Take 2,000 Units by mouth daily.        Review of Systems  Constitutional:  Negative for appetite change, chills, fatigue, fever and unexpected weight change.  HENT:  Negative for congestion, dental problem, ear discharge, ear pain, facial swelling, hearing loss, nosebleeds, postnasal drip, rhinorrhea, sinus pressure, sinus pain, sneezing, sore throat, tinnitus and trouble  swallowing.   Eyes:  Negative for pain, discharge, redness, itching and visual disturbance.  Respiratory:  Negative for cough, chest tightness, shortness of breath and wheezing.   Cardiovascular:  Negative for chest pain, palpitations and leg swelling.  Gastrointestinal:  Negative for abdominal distention, abdominal pain, blood in stool, constipation, diarrhea, nausea and vomiting.  Endocrine: Negative for cold intolerance, heat intolerance, polydipsia, polyphagia and polyuria.  Genitourinary:  Negative for difficulty urinating, dysuria, flank pain, frequency and urgency.  Musculoskeletal:  Positive for gait problem. Negative for arthralgias, back pain, joint swelling, myalgias, neck pain and neck stiffness.  Skin:  Negative for color change, pallor, rash and wound.  Neurological:  Negative for dizziness, syncope, speech difficulty, weakness, light-headedness, numbness and headaches.  Hematological:  Does not bruise/bleed easily.  Psychiatric/Behavioral:  Negative for agitation, behavioral problems, confusion, hallucinations, self-injury, sleep disturbance and suicidal ideas. The patient is not nervous/anxious.     Immunization History  Administered Date(s) Administered   Fluad Quad(high Dose 65+) 03/20/2019, 06/03/2020, 03/31/2021, 04/25/2022   Fluad Trivalent(High Dose 65+) 04/27/2023   Influenza, High Dose Seasonal PF 02/08/2017, 04/04/2018   Influenza,inj,Quad PF,6+ Mos 03/14/2013, 03/26/2014, 02/19/2015, 03/02/2016   PFIZER(Purple Top)SARS-COV-2 Vaccination 07/21/2019, 08/13/2019, 07/01/2020   Pneumococcal Conjugate-13 08/02/2013, 08/03/2014   Pneumococcal Polysaccharide-23 05/22/2009   Td 10/22/2017   Zoster Recombinant(Shingrix) 05/20/2018, 11/06/2018   Zoster, Live 09/25/2013   Pertinent  Health Maintenance Due  Topic Date Due   INFLUENZA VACCINE  12/21/2023   DEXA SCAN  Completed      09/05/2022   10:59 AM 10/26/2022    9:59 AM 12/18/2022    9:04 AM 04/27/2023   10:11 AM  09/06/2023   10:19 AM  Fall Risk  Falls in the past year? 1 0 0 0 0  Was there an injury with Fall? 1 0 0 0 0  Fall Risk Category Calculator 2 0 0 0 0  Patient at Risk for Falls Due to History of fall(s);Impaired mobility;Impaired balance/gait No Fall Risks  No Fall Risks Impaired balance/gait;Impaired mobility  Fall risk Follow up Falls evaluation completed;Education provided;Falls prevention discussed Falls evaluation completed Falls evaluation completed Falls evaluation completed Falls evaluation completed   Functional Status Survey:    Vitals:   10/29/23 1039  BP: 116/70  Pulse: 99  Resp: 18  Temp: 97.7 F (36.5 C)  SpO2: 97%  Weight: 127 lb 6.4 oz (57.8 kg)  Height: 5\' 4"  (1.626 m)   Body mass index is 21.87 kg/m. Physical Exam  VITALS: T- 97.7, P-  99, BP- 116/70, SaO2- 97% MEASUREMENTS: Weight- 127.4. GENERAL: Alert, cooperative, well developed, no acute distress. HEENT: Normocephalic, normal oropharynx, moist mucous membranes, ears normal, no infection, nose normal, eyes normal. NECK: Thyroid  normal. CHEST: Clear to auscultation bilaterally, no wheezes, rhonchi, or crackles, no chest wall tenderness. CARDIOVASCULAR: Normal heart rate and rhythm, S1 and S2 normal without murmurs. ABDOMEN: Soft, non-tender, non-distended, without organomegaly, normal bowel sounds. EXTREMITIES: No cyanosis or edema, small non-tender knot in leg. NEUROLOGICAL: Cranial nerves grossly intact, moves all extremities without gross motor or sensory deficit.   Labs reviewed: Recent Labs    10/16/23 1651 10/16/23 2209 10/17/23 0517 10/18/23 0658 10/19/23 0603  NA  --    < > 136 141 139  K  --    < > 3.5 3.4* 4.1  CL  --    < > 107 106 111  CO2  --    < > 21* 24 24  GLUCOSE  --    < > 74 119* 103*  BUN  --    < > 14 11 12   CREATININE  --    < > 1.34* 1.18* 1.17*  CALCIUM   --    < > 7.8* 8.1* 8.3*  MG 1.8  --   --   --  1.8   < > = values in this interval not displayed.   Recent Labs     08/29/23 1230 10/16/23 1045 10/17/23 0517  AST 16 17 14*  ALT 10 8 8   ALKPHOS 73 48 38  BILITOT 0.5 1.8* 1.4*  PROT 6.9 6.3* 4.9*  ALBUMIN 4.3 3.1* 2.4*   Recent Labs    04/27/23 1121 08/29/23 1230 10/16/23 1045 10/16/23 1118 10/17/23 0517 10/18/23 0658 10/19/23 0603  WBC 8.2   < > 16.7*  --  11.8* 10.7* 7.1  NEUTROABS 5,437  --  12.9*  --   --   --   --   HGB 14.9   < > 14.0   < > 11.4* 11.1* 11.0*  HCT 46.3*   < > 45.0   < > 34.6* 33.3* 34.0*  MCV 93.0   < > 97.8  --  92.8 92.5 93.4  PLT 142   < > 103*  --  89* 100* 103*   < > = values in this interval not displayed.   Lab Results  Component Value Date   TSH 0.585 08/29/2023   Lab Results  Component Value Date   HGBA1C 6.0 (H) 09/15/2019   Lab Results  Component Value Date   CHOL 138 04/27/2023   HDL 54 04/27/2023   LDLCALC 62 04/27/2023   TRIG 134 04/27/2023   CHOLHDL 2.6 04/27/2023    Significant Diagnostic Results in last 30 days:  CT ABDOMEN PELVIS W CONTRAST Result Date: 10/16/2023 CLINICAL DATA:  Abdominal pain, acute, nonlocalized EXAM: CT ABDOMEN AND PELVIS WITH CONTRAST TECHNIQUE: Multidetector CT imaging of the abdomen and pelvis was performed using the standard protocol following bolus administration of intravenous contrast. RADIATION DOSE REDUCTION: This exam was performed according to the departmental dose-optimization program which includes automated exposure control, adjustment of the mA and/or kV according to patient size and/or use of iterative reconstruction technique. CONTRAST:  60mL OMNIPAQUE  IOHEXOL  350 MG/ML SOLN COMPARISON:  CT abdomen/pelvis dated 06/06/2022. FINDINGS: Lower chest: Similar bibasilar subpleural reticular changes with possible mild honeycombing. Hepatobiliary: No focal liver abnormality is seen. No gallstones, gallbladder wall thickening, or biliary dilatation. Pancreas: Unremarkable. No pancreatic ductal dilatation or surrounding inflammatory changes. Spleen: Normal  in size  without focal abnormality. Adrenals/Urinary Tract: Adrenal glands are unremarkable. Kidneys are normal, without renal calculi, focal lesion, or hydronephrosis. Bladder is unremarkable. Stomach/Bowel: Extensive sigmoid colonic diverticulosis with mid to distal sigmoid colonic wall thickening, submucosal edema, pericolonic fat stranding, and trace fluid, most compatible with acute diverticulitis. No evidence of perforation or abscess. Diffuse colonic diverticulosis. Similar moderate-to-large hiatal hernia. Small bowel is grossly unremarkable. No evidence of obstruction. Vascular/Lymphatic: Aortic atherosclerosis. No enlarged abdominal or pelvic lymph nodes. Reproductive: Status post hysterectomy. No adnexal masses. Other: No intraperitoneal free air. Tiny fat containing umbilical hernia. Musculoskeletal: Diffuse osseous demineralization. Status post left hip arthroplasty. Stable mild L3 endplate compression deformity. No acute osseous abnormality. IMPRESSION: 1. Acute sigmoid colonic diverticulitis. No evidence of perforation or abscess. 2. Similar moderate-to-large hiatal hernia. 3. Aortic Atherosclerosis (ICD10-I70.0). Electronically Signed   By: Mannie Seek M.D.   On: 10/16/2023 15:03   DG Chest Port 1 View Result Date: 10/16/2023 CLINICAL DATA:  Questionable sepsis - evaluate for abnormality EXAM: PORTABLE CHEST 1 VIEW COMPARISON:  01/14/2021. FINDINGS: Stable mild cardiomegaly. Aortic atherosclerosis. Low lung volumes. No focal consolidation, sizeable pleural effusion, or pneumothorax. Surgical clips in the right axilla. No acute osseous abnormality. IMPRESSION: 1. No acute cardiopulmonary findings. 2. Stable mild cardiomegaly. Electronically Signed   By: Mannie Seek M.D.   On: 10/16/2023 14:53   ECHOCARDIOGRAM COMPLETE Result Date: 10/02/2023    ECHOCARDIOGRAM REPORT   Patient Name:   HENRETTER PIEKARSKI     Date of Exam: 10/02/2023 Medical Rec #:  [id number]       Height:       64.0 in Accession #:     [id number]      Weight:       132.8 lb Date of Birth:  1940-12-07       BSA:          1.644 m Patient Age:    82 years        BP:           108/70 mmHg Patient Gender: F               HR:           70 bpm. Exam Location:  [address] Procedure: 2D Echo, 3D Echo, Cardiac Doppler and Color Doppler (Both Spectral            and Color Flow Doppler were utilized during procedure). Indications:    R07.2 Precordial pain; R06.02 SOB; R60.0 Lower extremity edema;                 I48.2 Chronic atrial fibrillation  History:        Patient has prior history of Echocardiogram examinations, most                 recent 04/18/2023. CHF, Arrythmias:Atrial Fibrillation,                 Signs/Symptoms:Chest Pain, Shortness of Breath and Edema; Risk                 Factors:Hypertension, CKD stage 3, Dyslipidemia and Non-Smoker.                 Patient complains of new chest pain, increasing SOB and mild leg                 edema. History of bilateral breast cancer with mastectomies.  Sonographer:    Richarda Chance RVT, RDCS (AE), RDMS Referring Phys:  [id number] MIHAI CROITORU  Sonographer Comments: Image acquisition challenging due to mastectomy. Global longitudinal strain was attempted. IMPRESSIONS  1. Left ventricular ejection fraction, by estimation, is 55 to 60%. Left ventricular ejection fraction by 3D volume is 50 %. The left ventricle has normal function. The left ventricle has no regional wall motion abnormalities. Left ventricular diastolic  function could not be evaluated.  2. Right ventricular systolic function is low normal. The right ventricular size is normal. There is normal pulmonary artery systolic pressure. The estimated right ventricular systolic pressure is 27.0 mmHg.  3. Left atrial size was moderately dilated.  4. Right atrial size was moderately dilated.  5. The mitral valve is normal in structure. Mild mitral valve regurgitation.  6. Tricuspid valve regurgitation is mild to moderate.  7. The aortic valve is  tricuspid. Aortic valve regurgitation is not visualized. No aortic stenosis is present.  8. There is mild (Grade II) protruding plaque involving the ascending aorta. Comparison(s): EF 55%, moderate TR. FINDINGS  Left Ventricle: Left ventricular ejection fraction, by estimation, is 55 to 60%. Left ventricular ejection fraction by 3D volume is 50 %. The left ventricle has normal function. The left ventricle has no regional wall motion abnormalities. The left ventricular internal cavity size was normal in size. There is no left ventricular hypertrophy. Abnormal (paradoxical) septal motion, consistent with left bundle branch block. Left ventricular diastolic function could not be evaluated due to atrial fibrillation. Left ventricular diastolic function could not be evaluated. Right Ventricle: The right ventricular size is normal. No increase in right ventricular wall thickness. Right ventricular systolic function is low normal. There is normal pulmonary artery systolic pressure. The tricuspid regurgitant velocity is 2.45 m/s,  and with an assumed right atrial pressure of 3 mmHg, the estimated right ventricular systolic pressure is 27.0 mmHg. Left Atrium: Left atrial size was moderately dilated. Right Atrium: Right atrial size was moderately dilated. Pericardium: There is no evidence of pericardial effusion. Mitral Valve: The mitral valve is normal in structure. Mild mitral valve regurgitation, with eccentric posteriorly directed jet. Tricuspid Valve: The tricuspid valve is normal in structure. Tricuspid valve regurgitation is mild to moderate. Aortic Valve: The aortic valve is tricuspid. Aortic valve regurgitation is not visualized. No aortic stenosis is present. Aortic valve mean gradient measures 3.0 mmHg. Aortic valve peak gradient measures 4.4 mmHg. Aortic valve area, by VTI measures 1.85 cm. Pulmonic Valve: The pulmonic valve was normal in structure. Pulmonic valve regurgitation is trivial. No evidence of pulmonic  stenosis. Aorta: The aortic root and ascending aorta are structurally normal, with no evidence of dilitation. There is mild (Grade II) protruding plaque involving the ascending aorta. IAS/Shunts: No atrial level shunt detected by color flow Doppler. Additional Comments: 3D was performed not requiring image post processing on an independent workstation and was abnormal.  LEFT VENTRICLE PLAX 2D LVIDd:         3.66 cm         Diastology LVIDs:         2.64 cm         LV e' medial:    5.12 cm/s LV PW:         0.74 cm         LV E/e' medial:  25.2 LV IVS:        0.95 cm         LV e' lateral:   6.30 cm/s LVOT diam:     1.96 cm  LV E/e' lateral: 20.5 LV SV:         45 LV SV Index:   27 LVOT Area:     3.02 cm        3D Volume EF                                LV 3D EF:    Left                                             ventricul                                             ar                                             ejection                                             fraction                                             by 3D                                             volume is                                             50 %.                                 3D Volume EF:                                3D EF:        50 %                                LV EDV:       74 ml                                LV ESV:       37 ml                                LV SV:        37 ml RIGHT VENTRICLE RV S  prime:     10.80 cm/s TAPSE (M-mode): 1.5 cm LEFT ATRIUM           Index        RIGHT ATRIUM           Index LA diam:      3.67 cm 2.23 cm/m   RA Area:     22.70 cm LA Vol (A2C): 67.4 ml 41.00 ml/m  RA Volume:   70.55 ml  42.92 ml/m  AORTIC VALVE                    PULMONIC VALVE AV Area (Vmax):    2.16 cm     PV Vmax:       0.76 m/s AV Area (Vmean):   1.77 cm     PV Peak grad:  2.3 mmHg AV Area (VTI):     1.85 cm AV Vmax:           105.00 cm/s AV Vmean:          79.600 cm/s AV VTI:            0.241 m AV Peak Grad:       4.4 mmHg AV Mean Grad:      3.0 mmHg LVOT Vmax:         75.00 cm/s LVOT Vmean:        46.600 cm/s LVOT VTI:          0.148 m LVOT/AV VTI ratio: 0.61  AORTA Ao Root diam: 2.95 cm Ao Asc diam:  3.10 cm Ao Arch diam: 2.8 cm MITRAL VALVE                TRICUSPID VALVE MV Area (PHT): 4.68 cm     TR Peak grad:   24.0 mmHg MV Decel Time: 162 msec     TR Vmax:        245.00 cm/s MV E velocity: 129.00 cm/s                             SHUNTS                             Systemic VTI:  0.15 m                             Systemic Diam: 1.96 cm Mihai Croitoru MD Electronically signed by Luana Rumple MD Signature Date/Time: 10/02/2023/9:29:42 AM    Final     Assessment/Plan  Acute Sigmoid Colon Diverticulitis Recently hospitalized for acute sigmoid colon diverticulitis, treated with IV antibiotics (Rosadan and Flagyl ) and discharged with a 7-day course of Augmentin , which has been completed. Currently, no significant abdominal pain, nausea, or vomiting. Developed diarrhea likely due to antibiotics. Discussed dietary modifications to avoid foods that may exacerbate diverticulitis, such as seeds, nuts, and certain vegetables. - Advise use of probiotics or probiotic-rich foods like yogurt to restore gut flora - Advise dietary modifications to avoid seeds, nuts, and certain vegetables  Chronic Diastolic Congestive Heart Failure Chronic diastolic congestive heart failure with recent echocardiogram showing ejection fraction between 55-60%. No current symptoms of leg swelling, shortness of breath, or cough. Heart is slightly enlarged on chest x-ray. EKG shows persistent irregular heartbeat. Advised to avoid caffeine due to irregular heartbeat as it can exacerbate symptoms and cause fatigue. - Continue current heart  failure management regimen - Advise against caffeine consumption to prevent exacerbation of irregular heartbeat  Chronic Kidney Disease, Stage 3 Chronic kidney disease stage 3, currently well-managed.  Recent hospitalization noted low potassium levels, which were corrected with supplementation. Plan to recheck electrolytes to ensure stability. - Order lab tests to recheck electrolytes, including potassium levels  Anemia Slight anemia with hemoglobin levels around 11.1-11.3, with no signs of bleeding. Plan to monitor hemoglobin levels to ensure stability. - Order CBC to monitor hemoglobin levels and check for signs of infection  Anxiety Chronic anxiety managed with Xanax  and sertraline . No current issues with anxiety or depression reported. - Continue current medications for anxiety management  History of Breast Cancer Right breast cancer, currently on tamoxifen . No new issues reported. - Continue tamoxifen  as prescribed  General Health Maintenance Encouraged to maintain hydration and consume protein supplements to support nutrition and weight maintenance. Discussed the importance of small, frequent meals and adequate hydration to improve appetite and energy levels. - Encourage hydration with water, aiming for 6-8 glasses per day - Recommend protein supplements to support nutrition and weight maintenance - Advise small, frequent meals to improve appetite and energy levels  Follow-up Plan to follow up on lab results and schedule a six-month follow-up visit. - Order lab tests for cholesterol, thyroid , chemistry panel, and CBC - Schedule six-month follow-up visit - Call with lab results    Family/ staff Communication: Reviewed plan of care with patient and son verbalized understanding   Labs/tests ordered:  - CBC with Differential/Platelet - CMP with eGFR([facility]) - TSH - Lipid panel  Next Appointment : Return in about 6 months (around 04/29/2024) for medical mangement of chronic issues.Aaron Aas   Spent 32 minutes of Face to face and non-face to face with patient  >50% time spent counseling; reviewing medical record; tests; labs; documentation and developing future plan of care.   Estil Heman, NP

## 2023-10-30 LAB — CBC WITH DIFFERENTIAL/PLATELET
Absolute Lymphocytes: 2082 {cells}/uL (ref 850–3900)
Absolute Monocytes: 850 {cells}/uL (ref 200–950)
Basophils Absolute: 87 {cells}/uL (ref 0–200)
Basophils Relative: 0.8 %
Eosinophils Absolute: 185 {cells}/uL (ref 15–500)
Eosinophils Relative: 1.7 %
HCT: 42 % (ref 35.0–45.0)
Hemoglobin: 13.4 g/dL (ref 11.7–15.5)
MCH: 30.4 pg (ref 27.0–33.0)
MCHC: 31.9 g/dL — ABNORMAL LOW (ref 32.0–36.0)
MCV: 95.2 fL (ref 80.0–100.0)
MPV: 9.5 fL (ref 7.5–12.5)
Monocytes Relative: 7.8 %
Neutro Abs: 7695 {cells}/uL (ref 1500–7800)
Neutrophils Relative %: 70.6 %
Platelets: 212 10*3/uL (ref 140–400)
RBC: 4.41 10*6/uL (ref 3.80–5.10)
RDW: 12.7 % (ref 11.0–15.0)
Total Lymphocyte: 19.1 %
WBC: 10.9 10*3/uL — ABNORMAL HIGH (ref 3.8–10.8)

## 2023-10-30 LAB — COMPLETE METABOLIC PANEL WITHOUT GFR
AG Ratio: 1.6 (calc) (ref 1.0–2.5)
ALT: 12 U/L (ref 6–29)
AST: 21 U/L (ref 10–35)
Albumin: 3.9 g/dL (ref 3.6–5.1)
Alkaline phosphatase (APISO): 56 U/L (ref 37–153)
BUN/Creatinine Ratio: 11 (calc) (ref 6–22)
BUN: 15 mg/dL (ref 7–25)
CO2: 28 mmol/L (ref 20–32)
Calcium: 8.6 mg/dL (ref 8.6–10.4)
Chloride: 102 mmol/L (ref 98–110)
Creat: 1.35 mg/dL — ABNORMAL HIGH (ref 0.60–0.95)
Globulin: 2.5 g/dL (ref 1.9–3.7)
Glucose, Bld: 92 mg/dL (ref 65–99)
Potassium: 3.9 mmol/L (ref 3.5–5.3)
Sodium: 141 mmol/L (ref 135–146)
Total Bilirubin: 0.5 mg/dL (ref 0.2–1.2)
Total Protein: 6.4 g/dL (ref 6.1–8.1)

## 2023-10-30 LAB — LIPID PANEL
Cholesterol: 122 mg/dL (ref <200)
HDL: 43 mg/dL — ABNORMAL LOW (ref >=50)
LDL Cholesterol (Calc): 56 mg/dL
Non-HDL Cholesterol (Calc): 79 mg/dL (ref <130)
Total CHOL/HDL Ratio: 2.8 (calc) (ref <5.0)
Triglycerides: 150 mg/dL — ABNORMAL HIGH (ref <150)

## 2023-10-30 LAB — TSH: TSH: 0.64 m[IU]/L (ref 0.40–4.50)

## 2023-11-01 ENCOUNTER — Ambulatory Visit: Payer: Self-pay | Admitting: Family

## 2023-11-21 ENCOUNTER — Other Ambulatory Visit: Payer: Self-pay | Admitting: Family

## 2023-11-21 DIAGNOSIS — M545 Low back pain, unspecified: Secondary | ICD-10-CM

## 2023-11-26 ENCOUNTER — Ambulatory Visit (HOSPITAL_COMMUNITY)
Admission: RE | Admit: 2023-11-26 | Discharge: 2023-11-26 | Disposition: A | Discharge status: Home / Self Care | Source: Ambulatory Visit | Attending: Cardiovascular Disease | Admitting: Cardiovascular Disease

## 2023-11-26 DIAGNOSIS — R0602 Shortness of breath: Secondary | ICD-10-CM | POA: Insufficient documentation

## 2023-11-26 LAB — PULMONARY FUNCTION TEST
DL/VA % pred: 78 %
DL/VA: 3.18 ml/min/mmHg/L
DLCO unc % pred: 48 %
DLCO unc: 9.06 ml/min/mmHg
FEF 25-75 Post: 1.92 L/s
FEF 25-75 Pre: 1.31 L/s
FEF2575-%Change-Post: 45 %
FEF2575-%Pred-Post: 159 %
FEF2575-%Pred-Pre: 109 %
FEV1-%Change-Post: 7 %
FEV1-%Pred-Post: 85 %
FEV1-%Pred-Pre: 80 %
FEV1-Post: 1.55 L
FEV1-Pre: 1.44 L
FEV1FVC-%Change-Post: 7 %
FEV1FVC-%Pred-Pre: 110 %
FEV6-%Change-Post: 0 %
FEV6-%Pred-Post: 77 %
FEV6-%Pred-Pre: 78 %
FEV6-Post: 1.78 L
FEV6-Pre: 1.79 L
FEV6FVC-%Pred-Post: 106 %
FEV6FVC-%Pred-Pre: 106 %
FVC-%Change-Post: 0 %
FVC-%Pred-Post: 72 %
FVC-%Pred-Pre: 73 %
FVC-Post: 1.78 L
FVC-Pre: 1.79 L
Post FEV1/FVC ratio: 87 %
Post FEV6/FVC ratio: 100 %
Pre FEV1/FVC ratio: 81 %
Pre FEV6/FVC Ratio: 100 %
RV % pred: 78 %
RV: 1.94 L
TLC % pred: 73 %
TLC: 3.7 L

## 2023-11-26 MED ORDER — ALBUTEROL SULFATE (2.5 MG/3ML) 0.083% IN NEBU
2.5000 mg | INHALATION_SOLUTION | Freq: Once | RESPIRATORY_TRACT | Status: AC
  Administered 2023-11-26: 2.5 mg via RESPIRATORY_TRACT

## 2023-11-28 ENCOUNTER — Ambulatory Visit: Payer: Self-pay | Admitting: Cardiovascular Disease

## 2023-11-28 DIAGNOSIS — J849 Interstitial pulmonary disease, unspecified: Secondary | ICD-10-CM

## 2023-12-19 ENCOUNTER — Telehealth (HOSPITAL_COMMUNITY): Payer: Self-pay | Admitting: *Deleted

## 2023-12-19 NOTE — Telephone Encounter (Signed)
 Reminder call given for upcoming amyloid study on 12/24/23 at 11:00

## 2023-12-21 ENCOUNTER — Other Ambulatory Visit: Payer: Self-pay | Admitting: Cardiovascular Disease

## 2023-12-21 DIAGNOSIS — R0602 Shortness of breath: Secondary | ICD-10-CM

## 2023-12-21 DIAGNOSIS — I509 Heart failure, unspecified: Secondary | ICD-10-CM

## 2023-12-21 DIAGNOSIS — I4819 Other persistent atrial fibrillation: Secondary | ICD-10-CM

## 2023-12-24 ENCOUNTER — Ambulatory Visit (HOSPITAL_COMMUNITY)
Admission: RE | Admit: 2023-12-24 | Discharge: 2023-12-24 | Disposition: A | Discharge status: Home / Self Care | Source: Ambulatory Visit | Attending: Cardiology | Admitting: Cardiology

## 2023-12-24 DIAGNOSIS — I4819 Other persistent atrial fibrillation: Secondary | ICD-10-CM | POA: Diagnosis not present

## 2023-12-24 DIAGNOSIS — R0602 Shortness of breath: Secondary | ICD-10-CM

## 2023-12-24 DIAGNOSIS — I509 Heart failure, unspecified: Secondary | ICD-10-CM

## 2023-12-24 LAB — MYOCARDIAL AMYLOID PLANAR & SPECT: H/CL Ratio: 0.99

## 2023-12-24 MED ORDER — TECHNETIUM TC 99M PYROPHOSPHATE
21.2000 | Freq: Once | INTRAVENOUS | Status: AC
  Administered 2023-12-24: 21.2 via INTRAVENOUS

## 2023-12-31 ENCOUNTER — Other Ambulatory Visit: Payer: Self-pay | Admitting: Family

## 2023-12-31 DIAGNOSIS — E039 Hypothyroidism, unspecified: Secondary | ICD-10-CM

## 2023-12-31 DIAGNOSIS — F3289 Other specified depressive episodes: Secondary | ICD-10-CM

## 2023-12-31 NOTE — Telephone Encounter (Signed)
 High Risk Warning Populated when attempting to refill, I will send to Provider for further review    Message sent to  Ngetich, Dinah C, NP

## 2024-01-08 ENCOUNTER — Ambulatory Visit: Attending: Cardiovascular Disease | Admitting: Cardiovascular Disease

## 2024-01-08 VITALS — BP 100/60 | HR 80 | Ht 64.0 in | Wt 129.5 lb

## 2024-01-08 DIAGNOSIS — I700 Atherosclerosis of aorta: Secondary | ICD-10-CM | POA: Diagnosis not present

## 2024-01-08 DIAGNOSIS — I10 Essential (primary) hypertension: Secondary | ICD-10-CM | POA: Diagnosis not present

## 2024-01-08 DIAGNOSIS — D6869 Other thrombophilia: Secondary | ICD-10-CM | POA: Diagnosis not present

## 2024-01-08 DIAGNOSIS — E7800 Pure hypercholesterolemia, unspecified: Secondary | ICD-10-CM

## 2024-01-08 DIAGNOSIS — I4819 Other persistent atrial fibrillation: Secondary | ICD-10-CM

## 2024-01-08 DIAGNOSIS — K449 Diaphragmatic hernia without obstruction or gangrene: Secondary | ICD-10-CM

## 2024-01-08 DIAGNOSIS — I2510 Atherosclerotic heart disease of native coronary artery without angina pectoris: Secondary | ICD-10-CM | POA: Diagnosis not present

## 2024-01-08 DIAGNOSIS — R0609 Other forms of dyspnea: Secondary | ICD-10-CM | POA: Diagnosis not present

## 2024-01-08 NOTE — Patient Instructions (Signed)
 Medication Instructions:  Continue same medications *If you need a refill on your cardiac medications before your next appointment, please call your pharmacy*  Lab Work: None ordered  Testing/Procedures: None ordered  Follow-Up: At [facility], you and your health needs are our priority.  As part of our continuing mission to provide you with exceptional heart care, our providers are all part of one team.  This team includes your primary Cardiologist (physician) and Advanced Practice Providers or APPs (Physician Assistants and Nurse Practitioners) who all work together to provide you with the care you need, when you need it.  Your next appointment:  1 year   Call in April to schedule August appointment     Provider:  Dr.Croitoru   We recommend signing up for the patient portal called MyChart.  Sign up information is provided on this After Visit Summary.  MyChart is used to connect with patients for Virtual Visits (Telemedicine).  Patients are able to view lab/test results, encounter notes, upcoming appointments, etc.  Non-urgent messages can be sent to your provider as well.   To learn more about what you can do with MyChart, go to [email or web address].

## 2024-01-10 ENCOUNTER — Encounter: Payer: Self-pay | Admitting: Cardiovascular Disease

## 2024-01-10 NOTE — Progress Notes (Signed)
 Patient ID: Deborah Randall, female   DOB: [birth year], 83 y.o.   MRN: [id number]     Cardiology Office Note    Date:  01/10/2024   ID:  Arfa, Lamarca 18-Nov-1940, MRN [id number]  PCP:  Leonarda Roxan BROCKS, NP  Cardiologist:  Jerel Balding, MD   No chief complaint on file.   History of Present Illness:  Deborah Randall is a 83 y.o. female with recurrent paroxysmal atrial fibrillation with rapid ventricular response, initially diagnosed in 2011, s/p previous cardioversion (August 19, 2015).  She has a history of bilateral breast cancer and she did undergo radiation therapy for left breast cancer in 2005.  She has had intermittent problems with worsening exercise tolerance over the last 3 months and we have done a fairly extensive workup.  Her echocardiogram showed normal left ventricular systolic function and normal estimated systolic PA pressure, no serious valvular problems.  There was no evidence of amyloidosis on a PYP scan.  Her arrhythmia monitor shows that her atrial fibrillation is well rate controlled without excessive bradycardia or RVR.  Pulmonary function test showed a moderate reduction in total lung capacity and FVC at roughly 73% of predicted, with a commensurate reduction in FEV1.  Her CT of the chest shows a large hiatal hernia, with the entire stomach being intrathoracic.  In November 2024 she had a low risk nuclear stress test without any perfusion abnormalities.  On chest CT, coronary and aortic atherosclerosis calcifications are probably less than average for her age.  She has not had any problems with falls or bleeding problems.  She continues to live alone and takes care of her own housework although she does have some help.  Her son and brother live next-door.  She has well treated hypertension and hypothyroidism and hypercholesterolemia. She has a remote history of left mastectomy and radiation therapy in 2005 and then underwent surgery without radiation or chemotherapy on the  opposite breast in 2011. She has preserved left ventricular systolic function and a moderately dilated left atrium.  She had a normal nuclear perfusion study in November 2024.   Past Medical History:  Diagnosis Date   Anxiety    Atrial fibrillation with rapid ventricular response (HCC) 07/07/2015   Bilateral breast cancer (HCC)    Daily headache    Depression    Diverticulitis    GERD (gastroesophageal reflux disease)    Hyperlipidemia    Hypertension    Hypothyroidism    Iron  deficiency anemia 2000s   Lumbago    Nausea alone    PONV (postoperative nausea and vomiting)    Senile osteoporosis    Squamous cell carcinoma of forehead    I think they burned it off    Past Surgical History:  Procedure Laterality Date   ABDOMINAL HYSTERECTOMY  1970s   BREAST BIOPSY Bilateral    BREAST BIOPSY Right 05/18/2022   US  RT BREAST BX W LOC DEV 1ST LESION IMG BX SPEC US  GUIDE 05/18/2022 [facility]   BREAST LUMPECTOMY Right 2002   w/lymph nodes removed   CARDIOVERSION N/A 08/19/2015   Procedure: CARDIOVERSION;  Surgeon: Jerel Balding, MD;  Location: [facility];  Service: Cardiovascular;  Laterality: N/A;   CATARACT EXTRACTION W/ INTRAOCULAR LENS  IMPLANT, BILATERAL Bilateral 2000s   DILATION AND CURETTAGE OF UTERUS  ~ 1968   ELBOW FRACTURE SURGERY Left 2011   has 2 screws in it   EXCISIONAL HEMORRHOIDECTOMY  ~ 1970   FRACTURE SURGERY  02/12/2016   Right  Wrist   HEMIARTHROPLASTY HIP Left 2011   had ball replaced   INGUINAL HERNIA REPAIR Right 1980s?   MASTECTOMY Left 2005   MASTECTOMY Right 06/2022   ORIF WRIST FRACTURE Left 02/12/2016   Procedure: OPEN REDUCTION INTERNAL FIXATION LEFT DISTAL RADIUS;  Surgeon: Balinda Rogue, MD;  Location: [facility] OR;  Service: Plastics;  Laterality: Left;   SIMPLE MASTECTOMY WITH AXILLARY SENTINEL NODE BIOPSY Right 06/22/2022   Procedure: RIGHT SIMPLE MASTECTOMY;  Surgeon: Aron Shoulders, MD;  Location: [facility] OR;  Service: General;   Laterality: Right;   TOTAL THYROIDECTOMY  1972   WRIST FRACTURE SURGERY Right    has a plate and 7 screws in there    Current Medications: Outpatient Medications Prior to Visit  Medication Sig Dispense Refill   acetaminophen  (TYLENOL ) 325 MG tablet Take 2 tablets (650 mg total) by mouth every 4 (four) hours as needed for headache or mild pain.     albuterol  (VENTOLIN  HFA) 108 (90 Base) MCG/ACT inhaler Inhale 1 puff into the lungs every 6 (six) hours as needed for shortness of breath.     ALPRAZolam  (XANAX ) 0.5 MG tablet TAKE ONE TABLET BY MOUTH DAILY FOR ANXIETY 30 tablet 5   calcium  citrate (CALCITRATE - DOSED IN MG ELEMENTAL CALCIUM ) 950 (200 Ca) MG tablet Take 200 mg of elemental calcium  by mouth daily.     Cholecalciferol  (VITAMIN D3) 2000 UNITS TABS Take 2,000 Units by mouth daily.     diphenhydrAMINE  (BENADRYL ) 25 mg capsule Take 25 mg by mouth every 8 (eight) hours as needed for allergies.     docusate sodium  (COLACE) 100 MG capsule Take 1 capsule (100 mg total) by mouth daily. 10 capsule 0   ELIQUIS  5 MG TABS tablet TAKE 1 TABLET BY MOUTH TWICE DAILY 180 tablet 1   furosemide  (LASIX ) 40 MG tablet TAKE 1 TABLET BY MOUTH DAILY 90 tablet 1   gabapentin  (NEURONTIN ) 100 MG capsule TAKE 1 CAPSULE BY MOUTH AT BEDTIME 90 capsule 1   ibuprofen (ADVIL) 200 MG tablet Take 200 mg by mouth daily as needed for headache or mild pain.     Iron , Ferrous Sulfate , 325 (65 Fe) MG TABS Take 325 mg by mouth daily. 30 tablet    levothyroxine  (SYNTHROID ) 137 MCG tablet TAKE 1 TABLET BY MOUTH DAILY 60 tablet 2   meclizine  (ANTIVERT ) 12.5 MG tablet Take 12.5 mg by mouth as needed for dizziness.     metoprolol  tartrate (LOPRESSOR ) 25 MG tablet Take 1 tablet (25 mg total) by mouth 2 (two) times daily. 180 tablet 3   potassium chloride  SA (KLOR-CON  M) 20 MEQ tablet TAKE 2 TABLETS BY MOUTH DAILY 180 tablet 1   rosuvastatin  (CRESTOR ) 10 MG tablet TAKE 1 TABLET BY MOUTH DAILY 90 tablet 1   sertraline  (ZOLOFT ) 25  MG tablet TAKE 2 TABLETS BY MOUTH DAILY 180 tablet 1   tamoxifen  (NOLVADEX ) 20 MG tablet TAKE 1 TABLET BY MOUTH DAILY 90 tablet 3   No facility-administered medications prior to visit.     Allergies:   Patient has no known allergies.   Family History:  The patient's family history includes Alzheimer's disease in her brother; Bladder Cancer in her brother; COPD in her brother; Cancer in her father; Diabetes in her brother; Heart attack in her brother; Heart disease in her sister; Hypertension in her sister; Lung cancer in her sister; Mental illness in her son and son; Other in her brother, brother, and brother; Ovarian cancer in her sister; Skin cancer in  her brother.   ROS:   Please see the history of present illness.    ROS All other systems are reviewed and are negative.   PHYSICAL EXAM:   VS:  BP 100/60 (BP Location: Left Arm, Cuff Size: Normal)   Pulse 80   Ht 5' 4 (1.626 m)   Wt 129 lb 8 oz (58.7 kg)   BMI 22.23 kg/m       General: Alert, oriented x3, no distress, lean Head: no evidence of trauma, PERRL, EOMI, no exophtalmos or lid lag, no myxedema, no xanthelasma; normal ears, nose and oropharynx Neck: normal jugular venous pulsations and no hepatojugular reflux; brisk carotid pulses without delay and no carotid bruits Chest: clear to auscultation, no signs of consolidation by percussion or palpation, normal fremitus, symmetrical and full respiratory excursions Cardiovascular: normal position and quality of the apical impulse, irregular rhythm, normal first and second heart sounds, no murmurs, rubs or gallops Abdomen: no tenderness or distention, no masses by palpation, no abnormal pulsatility or arterial bruits, normal bowel sounds, no hepatosplenomegaly Extremities: no clubbing, cyanosis or edema; 2+ radial, ulnar and brachial pulses bilaterally; 2+ right femoral, posterior tibial and dorsalis pedis pulses; 2+ left femoral, posterior tibial and dorsalis pedis pulses; no  subclavian or femoral bruits Neurological: grossly nonfocal Psych: Normal mood and affect     Wt Readings from Last 3 Encounters:  01/08/24 129 lb 8 oz (58.7 kg)  10/29/23 127 lb 6.4 oz (57.8 kg)  10/19/23 142 lb 10.2 oz (64.7 kg)      Studies/Labs Reviewed:   EKG: Personally reviewed ECG tracing from 10/17/2023 which shows atrial fibrillation with controlled ventricular response, left axis deviation, left anterior fascicular block, but an otherwise unremarkable tracing  EKG Interpretation Date/Time:    Ventricular Rate:    PR Interval:    QRS Duration:    QT Interval:    QTC Calculation:   R Axis:      Text Interpretation:           Recent Labs: 08/29/2023: BNP 234.7 10/19/2023: Magnesium 1.8 10/29/2023: ALT 12; BUN 15; Creat 1.35; Hemoglobin 13.4; Platelets 212; Potassium 3.9; Sodium 141; TSH 0.64   Lipid Panel    Component Value Date/Time   CHOL 122 10/29/2023 1122   CHOL 155 11/22/2015 0858   TRIG 150 (H) 10/29/2023 1122   HDL 43 (L) 10/29/2023 1122   HDL 46 11/22/2015 0858   CHOLHDL 2.8 10/29/2023 1122   VLDL 17 07/08/2015 0535   LDLCALC 56 10/29/2023 1122    ASSESSMENT:    1. Dyspnea on exertion   2. Persistent atrial fibrillation (HCC)   3. Coronary artery calcification seen on CT scan   4. Aortic atherosclerosis (HCC)   5. Hiatal hernia   6. Acquired thrombophilia (HCC)   7. Essential hypertension, benign   8. Hypercholesterolemia       PLAN:  In order of problems listed above:  Exertional dyspnea/fatigue: She has longstanding persistent atrial fibrillation which was asymptomatic for a couple of years until recently.  There is no evidence of any other meaningful structural or functional cardiac abnormality.  She does not appear to be hypervolemic clinical standpoint and the BNP was only very modestly elevated, especially when taking into account her age.  I wonder whether her large hiatal hernia explains the abnormalities on PFTs and her shortness  of breath.  I do not think we will pursue additional cardiac workup at this time. AFib: Longstanding persistent arrhythmia, well rate controlled.  She  has severe biatrial enlargement and it is unlikely we will be able to return her to normal rhythm.  On anticoagulation (CHADSVasc score 4: age 48, gender, HTN). Coronary artery calcification/aortic atherosclerosis: These are described on the CT of the chest performed in January 2024 and are not surprising at age 33, but on my review the amount of coronary calcification is probably less than average for a typical octogenarian.  However, she is at increased risk for proximal coronary disease with history of left chest radiation therapy following left mastectomy and 2005.  She had a normal nuclear stress test less than a year ago. Moderate to large hiatal hernia: is likely to be part of her problems of shortness of breath.  Discussed the importance of small frequent meals, avoiding carbonated beverages, eating all her meals while sitting up straight up, may be sleeping with the head of the bed moderately elevated.  Cannot be sure that she is not having chronic aspiration so I encouraged her to keep her upcoming appointment with the pulmonary specialist. Anticoagulation: Has not had any bleeding complications.  No history of stroke or TIA. HTN: Will the only medication she is taking is a very low-dose of metoprolol  which needs for ventricular rate control.  Her blood pressure is borderline low. HLP: LDL in target range even if she does have CAD (LDL under 70).  Continue rosuvastatin .    Medication Adjustments/Labs and Tests Ordered: Current medicines are reviewed at length with the patient today.  Concerns regarding medicines are outlined above.  Medication changes, Labs and Tests ordered today are listed in the Patient Instructions below. Patient Instructions  Medication Instructions:  Continue same medications *If you need a refill on your cardiac  medications before your next appointment, please call your pharmacy*  Lab Work: None ordered  Testing/Procedures: None ordered  Follow-Up: At [facility], you and your health needs are our priority.  As part of our continuing mission to provide you with exceptional heart care, our providers are all part of one team.  This team includes your primary Cardiologist (physician) and Advanced Practice Providers or APPs (Physician Assistants and Nurse Practitioners) who all work together to provide you with the care you need, when you need it.  Your next appointment:  1 year   Call in April to schedule August appointment     Provider:  Dr.Rio Taber   We recommend signing up for the patient portal called MyChart.  Sign up information is provided on this After Visit Summary.  MyChart is used to connect with patients for Virtual Visits (Telemedicine).  Patients are able to view lab/test results, encounter notes, upcoming appointments, etc.  Non-urgent messages can be sent to your provider as well.   To learn more about what you can do with MyChart, go to [email or web address].          Signed, Jerel Balding, MD  01/10/2024 5:02 PM    [facility] [address] [place], [place], [place]  [address] Phone: [phone]; Fax: [phone]

## 2024-01-29 ENCOUNTER — Ambulatory Visit: Admitting: Internal Medicine

## 2024-01-29 ENCOUNTER — Encounter: Payer: Self-pay | Admitting: Internal Medicine

## 2024-01-29 VITALS — BP 118/74 | HR 67 | Temp 97.6°F | Ht 64.0 in | Wt 127.0 lb

## 2024-01-29 DIAGNOSIS — K449 Diaphragmatic hernia without obstruction or gangrene: Secondary | ICD-10-CM | POA: Diagnosis not present

## 2024-01-29 DIAGNOSIS — R7989 Other specified abnormal findings of blood chemistry: Secondary | ICD-10-CM

## 2024-01-29 DIAGNOSIS — J849 Interstitial pulmonary disease, unspecified: Secondary | ICD-10-CM

## 2024-01-29 DIAGNOSIS — R0609 Other forms of dyspnea: Secondary | ICD-10-CM | POA: Diagnosis not present

## 2024-01-29 LAB — SEDIMENTATION RATE: Sed Rate: 3 mm/h (ref 0–30)

## 2024-01-29 LAB — CK: Total CK: 29 U/L (ref 17–177)

## 2024-01-29 LAB — BRAIN NATRIURETIC PEPTIDE: Pro B Natriuretic peptide (BNP): 436 pg/mL — ABNORMAL HIGH (ref 0.0–100.0)

## 2024-01-29 NOTE — Patient Instructions (Addendum)
 ICD-10-CM   1. ILD (interstitial lung disease) (HCC)  J84.9     2. DOE (dyspnea on exertion)  R06.09     3. Elevated brain natriuretic peptide (BNP) level  R79.89         - I am concerned you  have Interstitial Lung Disease (ILD)  -  There are MANY varieties of this - To narrow down possibilities and assess severity please do the following  PLAN  - take ILD questionnaire packet with you and bring it back NEXT VISIT  - do spirometry and dlco test (no BD response, no lung volumes; choose a location depending on schedule and convenience)  - do High Resolution CT chest wo contrast - supine and prone, inspiratory and expiratory images  - do following blood work - autoimmune panel: Serum: ESR, ANA, DS-DNA, RF, anti-CCP, ssA, ssB, scl-70, Total CK,  Aldolase,   - do serum Hypersensitivity Pneumonitis Panel   - do blood  Quantiferon Gold   - do blood BNP   - do cbc with diff, BMET, LFT  Followup return office visit: - do simple walk test at followup ROV 6-8 weeks with MR, 30 min vsiti after all tests

## 2024-01-29 NOTE — Progress Notes (Signed)
 OV 01/29/2024  Subjective:  Patient ID: Deborah Randall, female , DOB: June 07, 1940 , age 83 y.o. , MRN: [id number] , ADDRESS: [address] Deborah Randall [place] [address] PCP Ngetich, Roxan BROCKS, NP Patient Care Team: Ngetich, Roxan BROCKS, NP as PCP - General (Family Medicine) Croitoru, Jerel, MD as PCP - Cardiology (Cardiology) [place], Amy, MD as Consulting Physician (Dermatology) Duke, Jon Garre, PA as Physician Assistant (Cardiology) Deborah Ash, MD as Consulting Physician (Hematology and Oncology) Deborah Shoulders, MD as Consulting Physician (General Surgery)  This Provider for this visit: Treatment Team:  Attending Provider: Francyne Jerel, MD    01/29/2024 -   Chief Complaint  Patient presents with   Consult    ILD consult Pt c/o of SOB. Pt states SOB is only with exertion. Pt monitors o2 at home at rest, stating it normally is around 95%.      Deborah Randall 83 y.o. -presents with her son.  The sinus is not an independent historian for this visit.  History is obtained from the patient and also review of the medical records.  She has multiple medical problems including anxiety persistent atrial fibrillation, chronic kidney disease stage III, chronic anticoagulation.  In February 2024 she was admitted for breast cancer of the lower inner quadrant of the right breast and s/p mastectomy.  Then she was admitted in May 2025 with acute diverticulitis.  CT of the abdomen lung images suggested subpleural reticular changes with possible mild honeycombing [I personally visualized and agree] in the 1 year before and in 2022 that is probably ILA and not ILD.  Therefore she has been referred here.  I personally visualized all these images.  In terms of her symptoms she reports insidious onset of shortness of breath for the last few years.  The history is somewhat fluctuating but it appears that she has been dyspneic on exertion for 2 to 3 years.  She feels it is getting worse.  Walking and  standing and changing clothes make her worse.  She is not on oxygen.  She denies any coughing or wheezing orthopnea paroxysmal nocturnal dyspnea or hemoptysis but there is edema on and off.  Review of the records indicate previous anemia Review the records indicate previous high creatinine consistent with chronic kidney disease Echocardiogram ejection fraction was okay in 2025 BNP was elevated in 2025.    CT Chest abdome lung image data from date:10/16/23  - personally visualized and independently interpreted : yes - my findings are: -     COMPARISON:  CT abdomen/pelvis dated 06/06/2022.   FINDINGS: Lower chest: Similar bibasilar subpleural reticular changes with possible mild honeycombing.   PFT     Latest Ref Rng & Units 11/26/2023    9:45 AM  PFT Results  FVC-Pre L 1.79   FVC-Predicted Pre % 73   FVC-Post L 1.78   FVC-Predicted Post % 72   Pre FEV1/FVC % % 81   Post FEV1/FCV % % 87   FEV1-Pre L 1.44   FEV1-Predicted Pre % 80   FEV1-Post L 1.55   DLCO uncorrected ml/min/mmHg 9.06   DLCO UNC% % 48   DLVA Predicted % 78   TLC L 3.70   TLC % Predicted % 73   RV % Predicted % 78      Latest Reference Range & Units 08/24/05 10:18 09/03/06 09:41 03/04/07 10:09 09/02/07 08:29 03/02/08 09:33 08/31/08 09:03 03/01/09 09:38 01/23/10 11:52 01/24/10 04:05 01/25/10 04:00 01/27/10 03:56 01/28/10 04:10 01/29/10  04:05 01/30/10 03:25 01/31/10 04:04 03/17/10 11:16 03/20/11 10:46 03/19/12 12:46 09/05/12 11:51 09/25/13 09:57 03/26/14 08:59 02/19/15 09:49 07/07/15 08:11 08/11/15 11:56 11/22/15 08:58 02/11/16 07:13 02/12/16 05:14 02/05/17 08:36 06/11/17 11:06 04/04/18 08:55 03/20/19 10:39 09/15/19 09:34 10/22/19 08:11 01/15/20 10:09 01/19/20 11:20 09/24/20 11:55 01/14/21 11:42 02/04/21 16:25 03/31/21 09:49 09/28/21 11:18 04/25/22 11:06 06/21/22 10:58 06/22/22 09:43 10/26/22 11:02 03/20/23 16:21 04/27/23 11:21 08/29/23 12:30 10/16/23 10:45 10/16/23 11:18 10/16/23 22:09 10/17/23 05:17 10/18/23 06:58  10/19/23 06:03 10/29/23 11:22  Creatinine 0.60 - 0.95 mg/dL 0.9 8.94 9.09 9.07 9.11 0.96 0.94 1.1 0.86 0.77 0.77 0.73 0.71 0.67 0.77 1.08 1.12 (H) 0.9 1.18 (H) 1.17 (H) 1.20 (H) 1.08 (H) 1.19 (H) 1.08 (H) 1.07 (H) 1.01 (H) 0.92 1.03 (H) 0.89 1.06 (H) 0.91 1.19 (H) 1.17 (H) 1.24 (H) 1.02 (H) 1.28 (H) 1.06 (H) 0.96 (H) 1.35 (H) 1.39 (H) 1.34 (H) 1.28 (H) 1.40 (H) 1.47 (H) 1.27 (H) 1.36 (H) 1.40 (H) 1.52 (H) 1.50 (H) 1.32 (H) 1.34 (H) 1.18 (H) 1.17 (H) 1.35 (H)  (H): Data is abnormally high   Latest Reference Range & Units 08/24/05 10:16 09/03/06 09:41 03/04/07 10:09 09/02/07 08:29 03/02/08 09:33 08/31/08 09:03 03/01/09 09:38 03/30/09 13:48 01/23/10 11:52 01/24/10 04:05 01/25/10 04:00 01/26/10 03:55 01/27/10 03:56 01/28/10 04:10 01/29/10 04:05 01/30/10 03:25 01/31/10 04:04 03/17/10 11:16 03/20/11 10:46 03/19/12 12:46 09/05/12 09:27 09/25/13 09:57 03/26/14 08:59 07/07/15 08:11 08/11/15 11:56 11/22/15 08:58 02/11/16 07:13 02/12/16 05:14 02/05/17 08:36 06/11/17 11:06 04/04/18 08:55 03/20/19 10:39 09/15/19 09:34 01/15/20 10:09 09/24/20 11:55 11/23/20 10:57 01/14/21 11:42 03/31/21 09:49 09/28/21 11:18 04/25/22 11:06 06/21/22 10:58 06/22/22 09:43 10/26/22 11:02 04/27/23 11:21 08/29/23 12:30 10/16/23 10:45 10/16/23 11:18 10/17/23 05:17 10/18/23 06:58 10/19/23 06:03 10/29/23 11:22  Hemoglobin 11.7 - 15.5 g/dL 85.5 86.4 86.4 86.1 86.2 13.7 12.7 13.9 13.5 12.6 10.5 (L) 9.4 (L) 9.3 (L) 9.6 (L) 9.1 (L) 9.0 (L) 9.6 (L) 13.5 14.6 13.9 13.6 7.8 (L) 14.6 14.2 13.9 13.1 12.8 12.0 13.6 13.3 12.4 12.2 10.6 (L) 12.3 11.1 (L) 10.4 (L) 11.6 (L) 11.0 (L) 10.3 (L) 14.8 13.7 14.3 13.9 14.9 14.6 14.0 14.6 11.4 (L) 11.1 (L) 11.0 (L) 13.4  (L): Data is abnormally low LAB RESULTS last 96 hours No results found.       has a past medical history of Anxiety, Atrial fibrillation with rapid ventricular response (HCC) (07/07/2015), Bilateral breast cancer (HCC), Daily headache, Depression, Diverticulitis, GERD (gastroesophageal reflux  disease), Hyperlipidemia, Hypertension, Hypothyroidism, Iron  deficiency anemia (2000s), Lumbago, Nausea alone, PONV (postoperative nausea and vomiting), Senile osteoporosis, and Squamous cell carcinoma of forehead.   reports that she has never smoked. She has never used smokeless tobacco.  Past Surgical History:  Procedure Laterality Date   ABDOMINAL HYSTERECTOMY  1970s   BREAST BIOPSY Bilateral    BREAST BIOPSY Right 05/18/2022   US  RT BREAST BX W LOC DEV 1ST LESION IMG BX SPEC US  GUIDE 05/18/2022 [facility]   BREAST LUMPECTOMY Right 2002   w/lymph nodes removed   CARDIOVERSION N/A 08/19/2015   Procedure: CARDIOVERSION;  Surgeon: Randall Balding, MD;  Location: [facility];  Service: Cardiovascular;  Laterality: N/A;   CATARACT EXTRACTION W/ INTRAOCULAR LENS  IMPLANT, BILATERAL Bilateral 2000s   DILATION AND CURETTAGE OF UTERUS  ~ 1968   ELBOW FRACTURE SURGERY Left 2011   has 2 screws in it   EXCISIONAL HEMORRHOIDECTOMY  ~ 1970   FRACTURE SURGERY  02/12/2016   Right Wrist   HEMIARTHROPLASTY HIP Left 2011   had ball replaced   INGUINAL HERNIA  REPAIR Right 1980s?   MASTECTOMY Left 2005   MASTECTOMY Right 06/2022   ORIF WRIST FRACTURE Left 02/12/2016   Procedure: OPEN REDUCTION INTERNAL FIXATION LEFT DISTAL RADIUS;  Surgeon: Balinda Rogue, MD;  Location: [facility] OR;  Service: Plastics;  Laterality: Left;   SIMPLE MASTECTOMY WITH AXILLARY SENTINEL NODE BIOPSY Right 06/22/2022   Procedure: RIGHT SIMPLE MASTECTOMY;  Surgeon: Deborah Shoulders, MD;  Location: [facility] OR;  Service: General;  Laterality: Right;   TOTAL THYROIDECTOMY  1972   WRIST FRACTURE SURGERY Right    has a plate and 7 screws in there    No Known Allergies  Immunization History  Administered Date(s) Administered   Fluad Quad(high Dose 65+) 03/20/2019, 06/03/2020, 03/31/2021, 04/25/2022   Fluad Trivalent(High Dose 65+) 04/27/2023   INFLUENZA, HIGH DOSE SEASONAL PF 02/08/2017, 04/04/2018   Influenza,inj,Quad  PF,6+ Mos 03/14/2013, 03/26/2014, 02/19/2015, 03/02/2016   PFIZER(Purple Top)SARS-COV-2 Vaccination 07/21/2019, 08/13/2019, 07/01/2020   Pneumococcal Conjugate-13 08/02/2013, 08/03/2014   Pneumococcal Polysaccharide-23 05/22/2009   Td 10/22/2017   Zoster Recombinant(Shingrix) 05/20/2018, 11/06/2018   Zoster, Live 09/25/2013    Family History  Problem Relation Age of Onset   Cancer Father    Lung cancer Sister    Heart disease Sister    Hypertension Sister    Ovarian cancer Sister    Heart attack Brother    Other Brother    Skin cancer Brother    Other Brother    Other Brother    Alzheimer's disease Brother    Bladder Cancer Brother    Diabetes Brother    COPD Brother    Mental illness Son    Mental illness Son      Current Outpatient Medications:    acetaminophen  (TYLENOL ) 325 MG tablet, Take 2 tablets (650 mg total) by mouth every 4 (four) hours as needed for headache or mild pain., Disp: , Rfl:    albuterol  (VENTOLIN  HFA) 108 (90 Base) MCG/ACT inhaler, Inhale 1 puff into the lungs every 6 (six) hours as needed for shortness of breath., Disp: , Rfl:    ALPRAZolam  (XANAX ) 0.5 MG tablet, TAKE ONE TABLET BY MOUTH DAILY FOR ANXIETY, Disp: 30 tablet, Rfl: 5   calcium  citrate (CALCITRATE - DOSED IN MG ELEMENTAL CALCIUM ) 950 (200 Ca) MG tablet, Take 200 mg of elemental calcium  by mouth daily., Disp: , Rfl:    Cholecalciferol  (VITAMIN D3) 2000 UNITS TABS, Take 2,000 Units by mouth daily., Disp: , Rfl:    diphenhydrAMINE  (BENADRYL ) 25 mg capsule, Take 25 mg by mouth every 8 (eight) hours as needed for allergies., Disp: , Rfl:    docusate sodium  (COLACE) 100 MG capsule, Take 1 capsule (100 mg total) by mouth daily., Disp: 10 capsule, Rfl: 0   ELIQUIS  5 MG TABS tablet, TAKE 1 TABLET BY MOUTH TWICE DAILY, Disp: 180 tablet, Rfl: 1   furosemide  (LASIX ) 40 MG tablet, TAKE 1 TABLET BY MOUTH DAILY, Disp: 90 tablet, Rfl: 1   gabapentin  (NEURONTIN ) 100 MG capsule, TAKE 1 CAPSULE BY MOUTH AT  BEDTIME, Disp: 90 capsule, Rfl: 1   ibuprofen (ADVIL) 200 MG tablet, Take 200 mg by mouth daily as needed for headache or mild pain., Disp: , Rfl:    Iron , Ferrous Sulfate , 325 (65 Fe) MG TABS, Take 325 mg by mouth daily., Disp: 30 tablet, Rfl:    levothyroxine  (SYNTHROID ) 137 MCG tablet, TAKE 1 TABLET BY MOUTH DAILY, Disp: 60 tablet, Rfl: 2   meclizine  (ANTIVERT ) 12.5 MG tablet, Take 12.5 mg by mouth as needed for dizziness.,  Disp: , Rfl:    metoprolol  tartrate (LOPRESSOR ) 25 MG tablet, Take 1 tablet (25 mg total) by mouth 2 (two) times daily., Disp: 180 tablet, Rfl: 3   potassium chloride  SA (KLOR-CON  M) 20 MEQ tablet, TAKE 2 TABLETS BY MOUTH DAILY, Disp: 180 tablet, Rfl: 1   rosuvastatin  (CRESTOR ) 10 MG tablet, TAKE 1 TABLET BY MOUTH DAILY, Disp: 90 tablet, Rfl: 1   sertraline  (ZOLOFT ) 25 MG tablet, TAKE 2 TABLETS BY MOUTH DAILY, Disp: 180 tablet, Rfl: 1   tamoxifen  (NOLVADEX ) 20 MG tablet, TAKE 1 TABLET BY MOUTH DAILY, Disp: 90 tablet, Rfl: 3      Objective:   Vitals:   01/29/24 0855  BP: 118/74  Pulse: 67  Temp: 97.6 F (36.4 C)  SpO2: 97%  Weight: 127 lb (57.6 kg)  Height: 5' 4 (1.626 m)    Estimated body mass index is 21.8 kg/m as calculated from the following:   Height as of this encounter: 5' 4 (1.626 m).   Weight as of this encounter: 127 lb (57.6 kg).  @WEIGHTCHANGE @  [employer]   01/29/24 0855  Weight: 127 lb (57.6 kg)     Physical Exam   General: No distress.  Looks more frail and deconditioned O2 at rest: no Cane present: no Sitting in wheel chair: no Frail: no Obese: no Neuro: Alert and Oriented x 3. GCS 15. Speech normal Psych: Pleasant Resp:  Barrel Chest - no.  Wheeze - no, Crackles - no, No overt respiratory distress CVS: Normal heart sounds. Murmurs - no Ext: Stigmata of Connective Tissue Disease - no HEENT: Normal upper airway. PEERL +. No post nasal drip        Assessment/     Assessment & Plan ILD (interstitial lung disease)  (HCC)  DOE (dyspnea on exertion)  Elevated brain natriuretic peptide (BNP) level  Hiatal hernia    PLAN Patient Instructions     ICD-10-CM   1. ILD (interstitial lung disease) (HCC)  J84.9     2. DOE (dyspnea on exertion)  R06.09     3. Elevated brain natriuretic peptide (BNP) level  R79.89         - I am concerned you  have Interstitial Lung Disease (ILD)  -  There are MANY varieties of this - To narrow down possibilities and assess severity please do the following  PLAN  - take ILD questionnaire packet with you and bring it back NEXT VISIT  - do spirometry and dlco test (no BD response, no lung volumes; choose a location depending on schedule and convenience)  - do High Resolution CT chest wo contrast - supine and prone, inspiratory and expiratory images  - do following blood work - autoimmune panel: Serum: ESR, ANA, DS-DNA, RF, anti-CCP, ssA, ssB, scl-70, Total CK,  Aldolase,   - do serum Hypersensitivity Pneumonitis Panel   - do blood  Quantiferon Gold   - do blood BNP   - do cbc with diff, BMET, LFT  Followup return office visit: - do simple walk test at followup ROV 6-8 weeks with MR, 30 min vsiti after all tests     FOLLOWUP    Return in about 6 weeks (around 03/11/2024) for 6-8 weeks, with Dr Geronimo, 30 min visit, after tests.    SIGNATURE    Dr. Dorethia Geronimo, M.D., F.C.C.P,  Pulmonary and Critical Care Medicine Staff Physician, [facility] Director - Interstitial Lung Disease  Program  Pulmonary Fibrosis [facility] at  [facility] [place], [place], [address]  Pager: [phone], If no answer or between  15:00h - 7:00h: call [phone] Telephone: [phone]  9:25 AM 01/29/2024

## 2024-01-31 ENCOUNTER — Telehealth: Payer: Self-pay | Admitting: Internal Medicine

## 2024-01-31 LAB — QUANTIFERON-TB GOLD PLUS
Mitogen-NIL: >10 [IU]/mL
NIL: 0.05 [IU]/mL
QuantiFERON-TB Gold Plus: NEGATIVE
TB1-NIL: <0 [IU]/mL
TB2-NIL: <0 [IU]/mL

## 2024-01-31 LAB — RHEUMATOID FACTOR: Rheumatoid fact SerPl-aCnc: <10 [IU]/mL (ref <14)

## 2024-01-31 LAB — CYCLIC CITRUL PEPTIDE ANTIBODY, IGG: Cyclic Citrullin Peptide Ab: <16 U

## 2024-01-31 LAB — ALDOLASE: Aldolase: 3.8 U/L (ref <=8.1)

## 2024-01-31 NOTE — Telephone Encounter (Signed)
  Her BNP is a range bound although slightly higher than before it still range bound.  I believe Dr.  JAYSON was aware of this.  She can continue to monitor the situation   Rest of the labs  Progress  Latest Reference Range & Units 09/15/19 09:34 08/29/23 12:30 01/29/24 09:22  BNP 0.0 - 100.0 pg/mL  234.7 (H)   Brain Natriuretic Peptide <100 pg/mL 356 (H)    Pro B Natriuretic peptide (BNP) 0.0 - 100.0 pg/mL   436.0 (H)  (H): Data is abnormally high

## 2024-02-01 LAB — ANA+ENA+DNA/DS+SCL 70+SJOSSA/B
ANA Titer 1: POSITIVE — AB
ENA RNP Ab: <0.2 AI (ref 0.0–0.9)
ENA SM Ab Ser-aCnc: <0.2 AI (ref 0.0–0.9)
ENA SSA (RO) Ab: <0.2 AI (ref 0.0–0.9)
ENA SSB (LA) Ab: <0.2 AI (ref 0.0–0.9)
Scleroderma (Scl-70) (ENA) Antibody, IgG: <0.2 AI (ref 0.0–0.9)
dsDNA Ab: <1 [IU]/mL (ref 0–9)

## 2024-02-01 LAB — HYPERSENSITIVITY PNEUMONITIS
A. Pullulans Abs: NEGATIVE
A.Fumigatus #1 Abs: NEGATIVE
Micropolyspora faeni, IgG: NEGATIVE
Pigeon Serum Abs: NEGATIVE
Thermoact. Saccharii: NEGATIVE
Thermoactinomyces vulgaris, IgG: NEGATIVE

## 2024-02-01 LAB — AUTOIMMUNE PROFILE
Anti Nuclear Antibody (ANA): NEGATIVE
Complement C3, Serum: 111 mg/dL (ref 82–167)

## 2024-02-01 LAB — FANA STAINING PATTERNS
Homogeneous Pattern: 1:160 {titer} — ABNORMAL HIGH
Nuclear Dot Pattern: 1:160 {titer} — ABNORMAL HIGH

## 2024-02-07 NOTE — Telephone Encounter (Signed)
 ATCx1 unable to leave voice mail

## 2024-02-08 ENCOUNTER — Ambulatory Visit (HOSPITAL_COMMUNITY)

## 2024-02-08 NOTE — Telephone Encounter (Signed)
 Patient called back,spoke with patient,verbalized understanding.NFN

## 2024-02-08 NOTE — Telephone Encounter (Signed)
 Copied from CRM [id number]. Topic: Clinical - Lab/Test Results >> Feb 07, 2024 11:03 AM Deborah Randall wrote: Reason for CRM: Patient returning phone call from [facility] for results - currently with Randall patient advised patient she will get Randall call back.  Callback number: [phone]  ATC x2 also unable to leave message due to full mailbox.

## 2024-02-08 NOTE — Telephone Encounter (Signed)
 Patient is returning call she just received . Calling cal to see if nurse is available. No one picked up . Calling back . Line disconnected calling patient back . Speaking with cal now to see who is available transferred to nurse kaiya.

## 2024-03-03 ENCOUNTER — Other Ambulatory Visit: Payer: Self-pay | Admitting: Family

## 2024-03-03 DIAGNOSIS — I509 Heart failure, unspecified: Secondary | ICD-10-CM

## 2024-03-03 DIAGNOSIS — R0609 Other forms of dyspnea: Secondary | ICD-10-CM

## 2024-03-12 DIAGNOSIS — L821 Other seborrheic keratosis: Secondary | ICD-10-CM | POA: Diagnosis not present

## 2024-03-12 DIAGNOSIS — D692 Other nonthrombocytopenic purpura: Secondary | ICD-10-CM | POA: Diagnosis not present

## 2024-03-12 DIAGNOSIS — D2272 Melanocytic nevi of left lower limb, including hip: Secondary | ICD-10-CM | POA: Diagnosis not present

## 2024-03-12 DIAGNOSIS — D1801 Hemangioma of skin and subcutaneous tissue: Secondary | ICD-10-CM | POA: Diagnosis not present

## 2024-03-12 DIAGNOSIS — L814 Other melanin hyperpigmentation: Secondary | ICD-10-CM | POA: Diagnosis not present

## 2024-03-12 DIAGNOSIS — Z85828 Personal history of other malignant neoplasm of skin: Secondary | ICD-10-CM | POA: Diagnosis not present

## 2024-03-13 DIAGNOSIS — Z96642 Presence of left artificial hip joint: Secondary | ICD-10-CM | POA: Diagnosis not present

## 2024-03-13 DIAGNOSIS — W19XXXA Unspecified fall, initial encounter: Secondary | ICD-10-CM | POA: Diagnosis not present

## 2024-03-13 DIAGNOSIS — R0789 Other chest pain: Secondary | ICD-10-CM | POA: Diagnosis not present

## 2024-03-13 DIAGNOSIS — S298XXA Other specified injuries of thorax, initial encounter: Secondary | ICD-10-CM | POA: Diagnosis not present

## 2024-03-13 DIAGNOSIS — S60221A Contusion of right hand, initial encounter: Secondary | ICD-10-CM | POA: Diagnosis not present

## 2024-03-13 DIAGNOSIS — S20212A Contusion of left front wall of thorax, initial encounter: Secondary | ICD-10-CM | POA: Diagnosis not present

## 2024-03-13 DIAGNOSIS — S60222A Contusion of left hand, initial encounter: Secondary | ICD-10-CM | POA: Diagnosis not present

## 2024-03-13 DIAGNOSIS — S0990XA Unspecified injury of head, initial encounter: Secondary | ICD-10-CM | POA: Diagnosis not present

## 2024-03-13 DIAGNOSIS — Z7901 Long term (current) use of anticoagulants: Secondary | ICD-10-CM | POA: Diagnosis not present

## 2024-03-13 DIAGNOSIS — M79641 Pain in right hand: Secondary | ICD-10-CM | POA: Diagnosis not present

## 2024-03-19 ENCOUNTER — Other Ambulatory Visit: Payer: Self-pay | Admitting: Cardiovascular Disease

## 2024-03-19 DIAGNOSIS — I4819 Other persistent atrial fibrillation: Secondary | ICD-10-CM

## 2024-03-19 NOTE — Telephone Encounter (Signed)
 Prescription refill request for Eliquis  received. Indication:afib Last office visit:8/25 Scr:1.35  6/25 Age: 83 Weight:57.6  kg  Under review

## 2024-04-25 ENCOUNTER — Inpatient Hospital Stay: Payer: Medicare PPO | Admitting: Hematology and Oncology

## 2024-04-29 ENCOUNTER — Ambulatory Visit: Payer: Self-pay | Admitting: Family

## 2024-05-19 ENCOUNTER — Encounter: Payer: Self-pay | Admitting: Family

## 2024-05-19 ENCOUNTER — Ambulatory Visit: Admitting: Family

## 2024-05-19 VITALS — BP 106/62 | HR 73 | Temp 97.3°F | Ht 64.0 in | Wt 129.6 lb

## 2024-05-19 DIAGNOSIS — F419 Anxiety disorder, unspecified: Secondary | ICD-10-CM | POA: Diagnosis not present

## 2024-05-19 DIAGNOSIS — E785 Hyperlipidemia, unspecified: Secondary | ICD-10-CM

## 2024-05-19 DIAGNOSIS — E039 Hypothyroidism, unspecified: Secondary | ICD-10-CM | POA: Diagnosis not present

## 2024-05-19 DIAGNOSIS — Z23 Encounter for immunization: Secondary | ICD-10-CM

## 2024-05-19 DIAGNOSIS — D509 Iron deficiency anemia, unspecified: Secondary | ICD-10-CM

## 2024-05-19 DIAGNOSIS — I509 Heart failure, unspecified: Secondary | ICD-10-CM

## 2024-05-19 DIAGNOSIS — I4819 Other persistent atrial fibrillation: Secondary | ICD-10-CM

## 2024-05-19 DIAGNOSIS — I10 Essential (primary) hypertension: Secondary | ICD-10-CM

## 2024-05-19 DIAGNOSIS — F3341 Major depressive disorder, recurrent, in partial remission: Secondary | ICD-10-CM | POA: Diagnosis not present

## 2024-05-19 NOTE — Progress Notes (Signed)
 "  Provider: Anaid Haney FNP-C   Deborah Randall, Deborah BROCKS, NP  Patient Care Team: Deborah Randall, Deborah BROCKS, NP as PCP - General (Family Medicine) Croitoru, Jerel, MD as PCP - Cardiology (Cardiology) Randall, Amy, MD as Consulting Physician (Dermatology) Randall, Deborah Garre, [place] as Physician Assistant (Cardiology) Deborah Ash, MD as Consulting Physician (Hematology and Oncology) Deborah Shoulders, MD as Consulting Physician (General Surgery)  Extended Emergency Contact Information Primary Emergency Contact: Randall,Deborah Address: [phone]          Deborah Randall [address] [place] Home Phone: [phone] Mobile Phone: [phone] Relation: Son Secondary Emergency Contact: Randall,Deborah  [place] Phone: [phone] Relation: Granddaughter  Code Status:  Full Code  Goals of care: Advanced Directive information    10/29/2023   10:33 AM  Advanced Directives  Does Patient Have a Medical Advance Directive? Yes  Type of Advance Directive Living will  Does patient want to make changes to medical advance directive? No - Patient declined     Chief Complaint  Patient presents with   Medical Management of Chronic Issues    6 month follow up    Dizziness    Experiencing dizziness here lately     History of Present Illness   Deborah Randall is an 83 year old female who presents for a six-month follow-up.  She experiences increased lightheadedness, particularly on Christmas Day, which required assistance to walk. She attributes this to stress and back pain from wrapping gifts. The lightheadedness persisted for two to three days and is still present intermittently, especially when standing or relaxing. She has been monitoring her blood pressure at home but does not recall recent readings. A nurse visit revealed a drop in blood pressure upon standing.  She continues to take furosemide  40 mg daily for swelling, Eliquis  2.5 mg twice daily for irregular heartbeat,  and metoprolol  25 mg twice daily. No worsening leg swelling is noted. She occasionally takes alprazolam  0.5 mg and uses Benadryl  25 mg every eight hours as needed. She has not used albuterol  recently. She takes calcium  citrate, vitamin D  2000 units, Colace 100 mg daily, gabapentin  100 mg at bedtime, ibuprofen 200 mg as needed, iron  325 mg daily, levothyroxine  135 mcg daily, and potassium 40 mEq daily. She occasionally uses meclizine  for dizziness.  She has been on tamoxifen  20 mg daily for nearly two years following a complete mastectomy in February of last year. She reports hair thinning and bald spots, which she attributes to tamoxifen  and thyroid  issues. She has not had recent lab work with her oncologist.  She experiences occasional nausea but no vomiting, constipation, or diarrhea. She reports good sleep but sometimes takes time to fall asleep and occasionally wakes to use the restroom. Her anxiety is well-managed and she denies depression.  She has a history of broken wrists and occasional hand tingling. She notes her head tremors slightly, but reports no family history of tremors. She has seen a neurologist who offered limited treatment options.   Past Medical History:  Diagnosis Date   Anxiety    Atrial fibrillation with rapid ventricular response (HCC) 07/07/2015   Bilateral breast cancer (HCC)    Daily headache    Depression    Diverticulitis    GERD (gastroesophageal reflux disease)    Hyperlipidemia    Hypertension    Hypothyroidism    Iron  deficiency anemia 2000s   Lumbago    Nausea alone    PONV (postoperative nausea and vomiting)  Senile osteoporosis    Squamous cell carcinoma of forehead    I think they burned it off   Past Surgical History:  Procedure Laterality Date   ABDOMINAL HYSTERECTOMY  1970s   BREAST BIOPSY Bilateral    BREAST BIOPSY Right 05/18/2022   US  RT BREAST BX W LOC DEV 1ST LESION IMG BX SPEC US  GUIDE 05/18/2022 [facility]   BREAST  LUMPECTOMY Right 2002   w/lymph nodes removed   CARDIOVERSION N/A 08/19/2015   Procedure: CARDIOVERSION;  Surgeon: Deborah Balding, MD;  Location: [facility];  Service: Cardiovascular;  Laterality: N/A;   CATARACT EXTRACTION W/ INTRAOCULAR LENS  IMPLANT, BILATERAL Bilateral 2000s   DILATION AND CURETTAGE OF UTERUS  ~ 1968   ELBOW FRACTURE SURGERY Left 2011   has 2 screws in it   EXCISIONAL HEMORRHOIDECTOMY  ~ 1970   FRACTURE SURGERY  02/12/2016   Right Wrist   HEMIARTHROPLASTY HIP Left 2011   had ball replaced   INGUINAL HERNIA REPAIR Right 1980s?   MASTECTOMY Left 2005   MASTECTOMY Right 06/2022   ORIF WRIST FRACTURE Left 02/12/2016   Procedure: OPEN REDUCTION INTERNAL FIXATION LEFT DISTAL RADIUS;  Surgeon: Balinda Rogue, MD;  Location: [facility] OR;  Service: Plastics;  Laterality: Left;   SIMPLE MASTECTOMY WITH AXILLARY SENTINEL NODE BIOPSY Right 06/22/2022   Procedure: RIGHT SIMPLE MASTECTOMY;  Surgeon: Deborah Shoulders, MD;  Location: [facility];  Service: General;  Laterality: Right;   TOTAL THYROIDECTOMY  1972   WRIST FRACTURE SURGERY Right    has a plate and 7 screws in there    Allergies[1]  Allergies as of 05/19/2024   No Known Allergies      Medication List        Accurate as of May 19, 2024 11:36 AM. If you have any questions, ask your nurse or doctor.          acetaminophen  325 MG tablet Commonly known as: TYLENOL  Take 2 tablets (650 mg total) by mouth every 4 (four) hours as needed for headache or mild pain.   albuterol  108 (90 Base) MCG/ACT inhaler Commonly known as: VENTOLIN  HFA Inhale 1 puff into the lungs every 6 (six) hours as needed for shortness of breath.   ALPRAZolam  0.5 MG tablet Commonly known as: XANAX  TAKE ONE TABLET BY MOUTH DAILY FOR ANXIETY   apixaban  2.5 MG Tabs tablet Commonly known as: Eliquis  Take 1 tablet (2.5 mg total) by mouth 2 (two) times daily.   calcium  citrate 950 (200 Ca) MG tablet Commonly known as: CALCITRATE -  dosed in mg elemental calcium  Take 200 mg of elemental calcium  by mouth daily.   diphenhydrAMINE  25 mg capsule Commonly known as: BENADRYL  Take 25 mg by mouth every 8 (eight) hours as needed for allergies.   docusate sodium  100 MG capsule Commonly known as: Colace Take 1 capsule (100 mg total) by mouth daily.   furosemide  40 MG tablet Commonly known as: LASIX  TAKE 1 TABLET BY MOUTH DAILY   gabapentin  100 MG capsule Commonly known as: NEURONTIN  TAKE 1 CAPSULE BY MOUTH AT BEDTIME   ibuprofen 200 MG tablet Commonly known as: ADVIL Take 200 mg by mouth daily as needed for headache or mild pain.   Iron  (Ferrous Sulfate ) 325 (65 Fe) MG Tabs Take 325 mg by mouth daily.   levothyroxine  137 MCG tablet Commonly known as: SYNTHROID  TAKE 1 TABLET BY MOUTH DAILY   meclizine  12.5 MG tablet Commonly known as: ANTIVERT  Take 12.5 mg by mouth as needed for dizziness.  metoprolol  tartrate 25 MG tablet Commonly known as: LOPRESSOR  Take 1 tablet (25 mg total) by mouth 2 (two) times daily.   potassium chloride  SA 20 MEQ tablet Commonly known as: KLOR-CON  M TAKE 2 TABLETS BY MOUTH DAILY   rosuvastatin  10 MG tablet Commonly known as: CRESTOR  TAKE 1 TABLET BY MOUTH DAILY   sertraline  25 MG tablet Commonly known as: ZOLOFT  TAKE 2 TABLETS BY MOUTH DAILY   tamoxifen  20 MG tablet Commonly known as: NOLVADEX  TAKE 1 TABLET BY MOUTH DAILY   Vitamin D3 50 MCG (2000 UT) Tabs Take 2,000 Units by mouth daily.        Review of Systems  Constitutional:  Negative for appetite change, chills, fatigue, fever and unexpected weight change.  HENT:  Negative for congestion, dental problem, ear discharge, ear pain, facial swelling, hearing loss, nosebleeds, postnasal drip, rhinorrhea, sinus pressure, sinus pain, sneezing, sore throat, tinnitus and trouble swallowing.   Eyes:  Negative for pain, discharge, redness, itching and visual disturbance.  Respiratory:  Negative for cough, chest tightness,  shortness of breath and wheezing.   Cardiovascular:  Negative for chest pain, palpitations and leg swelling.  Gastrointestinal:  Negative for abdominal distention, abdominal pain, blood in stool, constipation, diarrhea, nausea and vomiting.  Endocrine: Negative for cold intolerance, heat intolerance, polydipsia, polyphagia and polyuria.  Genitourinary:  Negative for difficulty urinating, dysuria, flank pain, frequency and urgency.  Musculoskeletal:  Positive for arthralgias and gait problem. Negative for back pain, joint swelling, myalgias, neck pain and neck stiffness.  Skin:  Negative for color change, pallor, rash and wound.  Neurological:  Positive for dizziness and tremors. Negative for syncope, speech difficulty, weakness, light-headedness, numbness and headaches.  Hematological:  Does not bruise/bleed easily.  Psychiatric/Behavioral:  Negative for agitation, behavioral problems, confusion, hallucinations, self-injury, sleep disturbance and suicidal ideas. The patient is not nervous/anxious.     Immunization History  Administered Date(s) Administered   Fluad Quad(high Dose 65+) 03/20/2019, 06/03/2020, 03/31/2021, 04/25/2022   Fluad Trivalent(High Dose 65+) 04/27/2023   INFLUENZA, HIGH DOSE SEASONAL PF 02/08/2017, 04/04/2018, 05/19/2024   Influenza,inj,Quad PF,6+ Mos 03/14/2013, 03/26/2014, 02/19/2015, 03/02/2016   PFIZER(Purple Top)SARS-COV-2 Vaccination 07/21/2019, 08/13/2019, 07/01/2020   Pneumococcal Conjugate-13 08/02/2013, 08/03/2014   Pneumococcal Polysaccharide-23 05/22/2009   Td 10/22/2017   Zoster Recombinant(Shingrix) 05/20/2018, 11/06/2018   Zoster, Live 09/25/2013   Pertinent  Health Maintenance Due  Topic Date Due   Mammogram  03/11/2016   Influenza Vaccine  Completed   Bone Density Scan  Completed      10/26/2022    9:59 AM 12/18/2022    9:04 AM 04/27/2023   10:11 AM 09/06/2023   10:19 AM 05/19/2024   10:47 AM  Fall Risk  Falls in the past year? 0 0 0 0 1  Was  there an injury with Fall? 0  0  0  0  1  Fall Risk Category Calculator 0 0 0 0 2  Patient at Risk for Falls Due to No Fall Risks  No Fall Risks Impaired balance/gait;Impaired mobility History of fall(s)  Fall risk Follow up Falls evaluation completed Falls evaluation completed Falls evaluation completed Falls evaluation completed Falls evaluation completed     Data saved with a previous flowsheet row definition   Functional Status Survey:    Vitals:   05/19/24 1049  BP: 106/62  Pulse: 73  Temp: (!) 97.3 F (36.3 C)  TempSrc: Temporal  SpO2: 96%  Weight: 129 lb 9.6 oz (58.8 kg)  Height: 5' 4 (1.626 m)  Body mass index is 22.25 kg/m. Physical Exam  VITALS: BP- 106/62 MEASUREMENTS: Weight- 129. GENERAL: Alert, cooperative, well developed, no acute distress HEENT: Normocephalic, normal oropharynx, moist mucous membranes, ears normal, nose normal, eyes normal NECK: Neck normal, thyroid  normal CHEST: Clear to auscultation bilaterally, no wheezes, rhonchi, or crackles CARDIOVASCULAR: Normal heart rate and rhythm, S1 and S2 normal without murmurs ABDOMEN: Soft, non-tender, non-distended, without organomegaly, normal bowel sounds EXTREMITIES: No cyanosis or edema, extremities normal NEUROLOGICAL: Cranial nerves grossly intact, moves all extremities without gross motor or sensory deficit SKIN: Skin normal  PSYCHIATRY/BEHAVIORAL: Mood stable    Labs reviewed: Recent Labs    10/16/23 1651 10/16/23 2209 10/18/23 0658 10/19/23 0603 10/29/23 1122  NA  --    < > 141 139 141  K  --    < > 3.4* 4.1 3.9  CL  --    < > 106 111 102  CO2  --    < > 24 24 28   GLUCOSE  --    < > 119* 103* 92  BUN  --    < > 11 12 15   CREATININE  --    < > 1.18* 1.17* 1.35*  CALCIUM   --    < > 8.1* 8.3* 8.6  MG 1.8  --   --  1.8  --    < > = values in this interval not displayed.   Recent Labs    08/29/23 1230 10/16/23 1045 10/17/23 0517 10/29/23 1122  AST 16 17 14* 21  ALT 10 8 8 12    ALKPHOS 73 48 38  --   BILITOT 0.5 1.8* 1.4* 0.5  PROT 6.9 6.3* 4.9* 6.4  ALBUMIN 4.3 3.1* 2.4*  --    Recent Labs    10/16/23 1045 10/16/23 1118 10/18/23 0658 10/19/23 0603 10/29/23 1122  WBC 16.7*   < > 10.7* 7.1 10.9*  NEUTROABS 12.9*  --   --   --  7,695  HGB 14.0   < > 11.1* 11.0* 13.4  HCT 45.0   < > 33.3* 34.0* 42.0  MCV 97.8   < > 92.5 93.4 95.2  PLT 103*   < > 100* 103* 212   < > = values in this interval not displayed.   Lab Results  Component Value Date   TSH 0.64 10/29/2023   Lab Results  Component Value Date   HGBA1C 6.0 (H) 09/15/2019   Lab Results  Component Value Date   CHOL 122 10/29/2023   HDL 43 (L) 10/29/2023   LDLCALC 56 10/29/2023   TRIG 150 (H) 10/29/2023   CHOLHDL 2.8 10/29/2023    Significant Diagnostic Results in last 30 days:  No results found.  Assessment/Plan  Chronic heart failure with persistent atrial fibrillation Chronic heart failure with persistent atrial fibrillation, managed with furosemide  and metoprolol . No worsening of leg swelling. Blood pressure management is crucial due to potential orthostatic hypotension. Recent lab work showed creatinine slightly elevated at 1.35, up from baseline of 1.17, indicating potential kidney strain from diuretic use. - Continue furosemide  40 mg daily. - Continue metoprolol  25 mg twice daily. - Monitor blood pressure at home and report if consistently below 100/60 mmHg. - Consider reducing furosemide  if leg swelling is not present. - Encouraged wearing compression stockings to manage leg swelling. - Ordered lab work to monitor kidney function.  Essential hypertension Blood pressure today was 106/62 mmHg, which may be too low. Home blood pressure readings are not recent. Orthostatic hypotension suspected as a  cause of lightheadedness. - Monitor blood pressure at home and report if consistently below 100/60 mmHg. - Advised on adequate hydration and nutrition, especially during increased  activity.  Acquired hypothyroidism Managed with levothyroxine . Potential contribution to hair thinning. - Continue levothyroxine  135 mcg daily. - Ordered thyroid  function tests to ensure appropriate dosing.  Hyperlipidemia Managed with Crestor . LDL cholesterol improved to 56 mg/dL, but triglycerides remain slightly elevated at 150 mg/dL. HDL cholesterol is low. - Continue Crestor  10 mg daily. - Advised dietary modifications to reduce triglycerides, including reducing intake of bread, pasta, sweets, and trans fats. - Encouraged regular exercise to increase HDL cholesterol.  History of breast cancer, s/p mastectomy, on tamoxifen  Breast cancer status post-mastectomy, currently on tamoxifen  for two years with a planned duration of five years. Experiencing hair thinning, a known side effect of tamoxifen . - Continue tamoxifen  20 mg daily. - Monitor for side effects, including hair thinning.  Anemia - Continue iron  325 mg daily.  Anxiety and recurrent major depressive disorder Anxiety and depression well-managed with current medication regimen. - Continue current medication regimen.   Family/ staff Communication: Reviewed plan of care with patient verbalized understanding   Labs/tests ordered:  - CBC with Differential/Platelet - CMP with eGFR([facility]) - TSH - Lipid panel  Next Appointment : Return in about 6 months (around 11/17/2024) for medical mangement of chronic issues.SABRA   Spent 30 minutes of Face to face and non-face to face with patient  >50% time spent counseling; reviewing medical record; tests; labs; documentation and developing future plan of care.   Deborah Randall Midge Momon, NP       [1] No Known Allergies  "

## 2024-05-20 LAB — CBC WITH DIFFERENTIAL/PLATELET
Absolute Lymphocytes: 2098 {cells}/uL (ref 850–3900)
Absolute Monocytes: 699 {cells}/uL (ref 200–950)
Basophils Absolute: 68 {cells}/uL (ref 0–200)
Basophils Relative: 0.9 %
Eosinophils Absolute: 296 {cells}/uL (ref 15–500)
Eosinophils Relative: 3.9 %
HCT: 41.8 % (ref 35.9–46.0)
Hemoglobin: 13.2 g/dL (ref 11.7–15.5)
MCH: 29.4 pg (ref 27.0–33.0)
MCHC: 31.6 g/dL (ref 31.6–35.4)
MCV: 93.1 fL (ref 81.4–101.7)
MPV: 10.3 fL (ref 7.5–12.5)
Monocytes Relative: 9.2 %
Neutro Abs: 4438 {cells}/uL (ref 1500–7800)
Neutrophils Relative %: 58.4 %
Platelets: 122 Thousand/uL — ABNORMAL LOW (ref 140–400)
RBC: 4.49 Million/uL (ref 3.80–5.10)
RDW: 12 % (ref 11.0–15.0)
Total Lymphocyte: 27.6 %
WBC: 7.6 Thousand/uL (ref 3.8–10.8)

## 2024-05-20 LAB — COMPLETE METABOLIC PANEL WITHOUT GFR
AG Ratio: 1.5 (calc) (ref 1.0–2.5)
ALT: 7 U/L (ref 6–29)
AST: 15 U/L (ref 10–35)
Albumin: 3.9 g/dL (ref 3.6–5.1)
Alkaline phosphatase (APISO): 62 U/L (ref 37–153)
BUN/Creatinine Ratio: 11 (calc) (ref 6–22)
BUN: 14 mg/dL (ref 7–25)
CO2: 30 mmol/L (ref 20–32)
Calcium: 9 mg/dL (ref 8.6–10.4)
Chloride: 101 mmol/L (ref 98–110)
Creat: 1.29 mg/dL — ABNORMAL HIGH (ref 0.60–0.95)
Globulin: 2.6 g/dL (ref 1.9–3.7)
Glucose, Bld: 103 mg/dL — ABNORMAL HIGH (ref 65–99)
Potassium: 4 mmol/L (ref 3.5–5.3)
Sodium: 143 mmol/L (ref 135–146)
Total Bilirubin: 0.6 mg/dL (ref 0.2–1.2)
Total Protein: 6.5 g/dL (ref 6.1–8.1)

## 2024-05-20 LAB — LIPID PANEL
Cholesterol: 122 mg/dL
HDL: 50 mg/dL
LDL Cholesterol (Calc): 53 mg/dL
Non-HDL Cholesterol (Calc): 72 mg/dL
Total CHOL/HDL Ratio: 2.4 (calc)
Triglycerides: 100 mg/dL

## 2024-05-20 LAB — TSH: TSH: 0.32 m[IU]/L — ABNORMAL LOW (ref 0.40–4.50)

## 2024-05-21 ENCOUNTER — Ambulatory Visit: Payer: Self-pay | Admitting: Family

## 2024-05-21 DIAGNOSIS — E039 Hypothyroidism, unspecified: Secondary | ICD-10-CM

## 2024-05-23 ENCOUNTER — Other Ambulatory Visit: Payer: Self-pay | Admitting: Family

## 2024-05-23 DIAGNOSIS — M545 Low back pain, unspecified: Secondary | ICD-10-CM

## 2024-05-23 MED ORDER — LEVOTHYROXINE SODIUM 125 MCG PO TABS: 125.0000 ug | ORAL_TABLET | Freq: Every day | ORAL | 1 refills | Status: AC

## 2024-07-15 ENCOUNTER — Other Ambulatory Visit

## 2024-09-08 ENCOUNTER — Ambulatory Visit: Payer: Self-pay | Admitting: Family

## 2024-11-17 ENCOUNTER — Ambulatory Visit: Admitting: Family
